# Patient Record
Sex: Female | Born: 1935 | Race: White | Hispanic: No | Smoking: Never smoker
Health system: Southern US, Community
[De-identification: ages and names within clinical notes are randomized; demographics above are authoritative.]

## PROBLEM LIST (undated history)

## (undated) DIAGNOSIS — Z9981 Dependence on supplemental oxygen: Secondary | ICD-10-CM

## (undated) DIAGNOSIS — IMO0002 Reserved for concepts with insufficient information to code with codable children: Secondary | ICD-10-CM

## (undated) DIAGNOSIS — I4891 Unspecified atrial fibrillation: Secondary | ICD-10-CM

## (undated) DIAGNOSIS — I8393 Asymptomatic varicose veins of bilateral lower extremities: Secondary | ICD-10-CM

## (undated) DIAGNOSIS — J40 Bronchitis, not specified as acute or chronic: Secondary | ICD-10-CM

## (undated) DIAGNOSIS — I5032 Chronic diastolic (congestive) heart failure: Secondary | ICD-10-CM

## (undated) DIAGNOSIS — E871 Hypo-osmolality and hyponatremia: Secondary | ICD-10-CM

## (undated) DIAGNOSIS — I4821 Permanent atrial fibrillation: Secondary | ICD-10-CM

## (undated) DIAGNOSIS — I509 Heart failure, unspecified: Secondary | ICD-10-CM

## (undated) DIAGNOSIS — B029 Zoster without complications: Secondary | ICD-10-CM

## (undated) DIAGNOSIS — E119 Type 2 diabetes mellitus without complications: Secondary | ICD-10-CM

## (undated) DIAGNOSIS — C50919 Malignant neoplasm of unspecified site of unspecified female breast: Secondary | ICD-10-CM

## (undated) DIAGNOSIS — I2510 Atherosclerotic heart disease of native coronary artery without angina pectoris: Secondary | ICD-10-CM

## (undated) DIAGNOSIS — E785 Hyperlipidemia, unspecified: Secondary | ICD-10-CM

## (undated) DIAGNOSIS — R609 Edema, unspecified: Secondary | ICD-10-CM

## (undated) DIAGNOSIS — F0390 Unspecified dementia without behavioral disturbance: Secondary | ICD-10-CM

## (undated) DIAGNOSIS — G629 Polyneuropathy, unspecified: Secondary | ICD-10-CM

## (undated) DIAGNOSIS — R600 Localized edema: Secondary | ICD-10-CM

## (undated) DIAGNOSIS — I341 Nonrheumatic mitral (valve) prolapse: Secondary | ICD-10-CM

## (undated) DIAGNOSIS — I679 Cerebrovascular disease, unspecified: Secondary | ICD-10-CM

## (undated) DIAGNOSIS — M1711 Unilateral primary osteoarthritis, right knee: Secondary | ICD-10-CM

## (undated) DIAGNOSIS — N182 Chronic kidney disease, stage 2 (mild): Secondary | ICD-10-CM

## (undated) DIAGNOSIS — R490 Dysphonia: Secondary | ICD-10-CM

## (undated) DIAGNOSIS — E039 Hypothyroidism, unspecified: Secondary | ICD-10-CM

## (undated) DIAGNOSIS — I739 Peripheral vascular disease, unspecified: Secondary | ICD-10-CM

## (undated) DIAGNOSIS — I10 Essential (primary) hypertension: Secondary | ICD-10-CM

## (undated) HISTORY — DX: Nonrheumatic mitral (valve) prolapse: I34.1

## (undated) HISTORY — DX: Unspecified dementia, unspecified severity, without behavioral disturbance, psychotic disturbance, mood disturbance, and anxiety: F03.90

## (undated) HISTORY — PX: COLONOSCOPY: SHX174

## (undated) HISTORY — DX: Zoster without complications: B02.9

## (undated) HISTORY — PX: ABDOMINAL HYSTERECTOMY: SHX81

## (undated) HISTORY — DX: Chronic kidney disease, stage 2 (mild): N18.2

## (undated) HISTORY — DX: Reserved for concepts with insufficient information to code with codable children: IMO0002

## (undated) HISTORY — PX: CATARACT EXTRACTION, BILATERAL: SHX1313

## (undated) HISTORY — PX: CORONARY ANGIOPLASTY WITH STENT PLACEMENT: SHX49

## (undated) HISTORY — DX: Heart failure, unspecified: I50.9

## (undated) HISTORY — PX: CARDIAC SURGERY: SHX584

## (undated) HISTORY — DX: Atherosclerotic heart disease of native coronary artery without angina pectoris: I25.10

## (undated) HISTORY — DX: Type 2 diabetes mellitus without complications: E11.9

## (undated) HISTORY — DX: Edema, unspecified: R60.9

## (undated) HISTORY — DX: Cerebrovascular disease, unspecified: I67.9

## (undated) HISTORY — DX: Unspecified atrial fibrillation: I48.91

## (undated) HISTORY — DX: Dysphonia: R49.0

## (undated) HISTORY — DX: Permanent atrial fibrillation: I48.21

## (undated) HISTORY — DX: Malignant neoplasm of unspecified site of unspecified female breast: C50.919

## (undated) HISTORY — DX: Hypothyroidism, unspecified: E03.9

## (undated) HISTORY — DX: Bronchitis, not specified as acute or chronic: J40

## (undated) HISTORY — DX: Peripheral vascular disease, unspecified: I73.9

---

## 1993-08-12 HISTORY — PX: OTHER SURGICAL HISTORY: SHX169

## 1999-10-03 ENCOUNTER — Encounter: Payer: Self-pay | Admitting: Cardiology

## 1999-10-03 ENCOUNTER — Inpatient Hospital Stay (HOSPITAL_COMMUNITY): Admission: AD | Admit: 1999-10-03 | Discharge: 1999-10-06 | Payer: Self-pay | Admitting: Cardiology

## 2001-01-26 ENCOUNTER — Other Ambulatory Visit: Admission: RE | Admit: 2001-01-26 | Discharge: 2001-01-26 | Payer: Self-pay | Admitting: Internal Medicine

## 2001-03-27 ENCOUNTER — Ambulatory Visit (HOSPITAL_COMMUNITY): Admission: RE | Admit: 2001-03-27 | Discharge: 2001-03-27 | Payer: Self-pay | Admitting: Oncology

## 2001-03-27 ENCOUNTER — Encounter (HOSPITAL_COMMUNITY): Payer: Self-pay | Admitting: Oncology

## 2001-04-03 ENCOUNTER — Encounter: Admission: RE | Admit: 2001-04-03 | Discharge: 2001-04-03 | Payer: Self-pay | Admitting: Oncology

## 2001-04-03 ENCOUNTER — Encounter (HOSPITAL_COMMUNITY): Admission: RE | Admit: 2001-04-03 | Discharge: 2001-05-03 | Payer: Self-pay | Admitting: Oncology

## 2001-08-05 ENCOUNTER — Emergency Department (HOSPITAL_COMMUNITY): Admission: EM | Admit: 2001-08-05 | Discharge: 2001-08-05 | Payer: Self-pay | Admitting: Emergency Medicine

## 2001-09-21 ENCOUNTER — Encounter: Admission: RE | Admit: 2001-09-21 | Discharge: 2001-09-21 | Payer: Self-pay | Admitting: Oncology

## 2001-09-21 ENCOUNTER — Encounter (HOSPITAL_COMMUNITY): Admission: RE | Admit: 2001-09-21 | Discharge: 2001-10-21 | Payer: Self-pay | Admitting: Oncology

## 2001-12-30 ENCOUNTER — Encounter (HOSPITAL_COMMUNITY): Payer: Self-pay | Admitting: Oncology

## 2001-12-30 ENCOUNTER — Encounter (HOSPITAL_COMMUNITY): Admission: RE | Admit: 2001-12-30 | Discharge: 2002-01-29 | Payer: Self-pay | Admitting: Oncology

## 2002-03-29 ENCOUNTER — Ambulatory Visit (HOSPITAL_COMMUNITY): Admission: RE | Admit: 2002-03-29 | Discharge: 2002-03-29 | Payer: Self-pay | Admitting: Oncology

## 2002-03-29 ENCOUNTER — Encounter (HOSPITAL_COMMUNITY): Payer: Self-pay | Admitting: Oncology

## 2002-06-22 ENCOUNTER — Encounter: Payer: Self-pay | Admitting: Internal Medicine

## 2002-06-22 ENCOUNTER — Ambulatory Visit (HOSPITAL_COMMUNITY): Admission: RE | Admit: 2002-06-22 | Discharge: 2002-06-22 | Payer: Self-pay | Admitting: Internal Medicine

## 2002-08-11 ENCOUNTER — Encounter (HOSPITAL_COMMUNITY): Admission: RE | Admit: 2002-08-11 | Discharge: 2002-09-10 | Payer: Self-pay | Admitting: Oncology

## 2002-08-11 ENCOUNTER — Encounter: Admission: RE | Admit: 2002-08-11 | Discharge: 2002-08-11 | Payer: Self-pay | Admitting: Oncology

## 2002-11-21 ENCOUNTER — Emergency Department (HOSPITAL_COMMUNITY): Admission: EM | Admit: 2002-11-21 | Discharge: 2002-11-21 | Payer: Self-pay | Admitting: *Deleted

## 2002-11-21 ENCOUNTER — Encounter: Payer: Self-pay | Admitting: *Deleted

## 2002-12-01 ENCOUNTER — Ambulatory Visit (HOSPITAL_COMMUNITY): Admission: RE | Admit: 2002-12-01 | Discharge: 2002-12-01 | Payer: Self-pay | Admitting: Internal Medicine

## 2002-12-01 ENCOUNTER — Encounter: Payer: Self-pay | Admitting: Internal Medicine

## 2002-12-17 ENCOUNTER — Encounter (HOSPITAL_COMMUNITY): Admission: RE | Admit: 2002-12-17 | Discharge: 2003-01-16 | Payer: Self-pay | Admitting: *Deleted

## 2002-12-24 ENCOUNTER — Encounter: Payer: Self-pay | Admitting: *Deleted

## 2002-12-24 ENCOUNTER — Emergency Department (HOSPITAL_COMMUNITY): Admission: EM | Admit: 2002-12-24 | Discharge: 2002-12-24 | Payer: Self-pay | Admitting: *Deleted

## 2002-12-24 ENCOUNTER — Inpatient Hospital Stay (HOSPITAL_COMMUNITY): Admission: EM | Admit: 2002-12-24 | Discharge: 2002-12-28 | Payer: Self-pay | Admitting: Internal Medicine

## 2003-01-08 ENCOUNTER — Encounter: Payer: Self-pay | Admitting: Cardiology

## 2003-01-08 ENCOUNTER — Encounter: Payer: Self-pay | Admitting: Emergency Medicine

## 2003-01-08 ENCOUNTER — Emergency Department (HOSPITAL_COMMUNITY): Admission: EM | Admit: 2003-01-08 | Discharge: 2003-01-08 | Payer: Self-pay | Admitting: Emergency Medicine

## 2003-01-08 ENCOUNTER — Inpatient Hospital Stay (HOSPITAL_COMMUNITY): Admission: EM | Admit: 2003-01-08 | Discharge: 2003-01-10 | Payer: Self-pay | Admitting: Cardiology

## 2003-01-10 ENCOUNTER — Encounter: Payer: Self-pay | Admitting: Cardiology

## 2003-02-09 ENCOUNTER — Encounter: Admission: RE | Admit: 2003-02-09 | Discharge: 2003-02-09 | Payer: Self-pay | Admitting: Oncology

## 2003-02-09 ENCOUNTER — Encounter (HOSPITAL_COMMUNITY): Admission: RE | Admit: 2003-02-09 | Discharge: 2003-03-11 | Payer: Self-pay | Admitting: Oncology

## 2003-04-01 ENCOUNTER — Ambulatory Visit (HOSPITAL_COMMUNITY): Admission: RE | Admit: 2003-04-01 | Discharge: 2003-04-01 | Payer: Self-pay | Admitting: Oncology

## 2003-04-01 ENCOUNTER — Encounter (HOSPITAL_COMMUNITY): Payer: Self-pay | Admitting: Oncology

## 2003-08-19 ENCOUNTER — Encounter (HOSPITAL_COMMUNITY): Admission: RE | Admit: 2003-08-19 | Discharge: 2003-09-18 | Payer: Self-pay | Admitting: Oncology

## 2003-08-19 ENCOUNTER — Encounter: Admission: RE | Admit: 2003-08-19 | Discharge: 2003-08-19 | Payer: Self-pay | Admitting: Oncology

## 2003-09-19 ENCOUNTER — Ambulatory Visit (HOSPITAL_COMMUNITY): Admission: RE | Admit: 2003-09-19 | Discharge: 2003-09-19 | Payer: Self-pay | Admitting: Cardiology

## 2004-03-13 ENCOUNTER — Encounter (HOSPITAL_COMMUNITY): Admission: RE | Admit: 2004-03-13 | Discharge: 2004-04-12 | Payer: Self-pay | Admitting: Oncology

## 2004-03-13 ENCOUNTER — Encounter: Admission: RE | Admit: 2004-03-13 | Discharge: 2004-03-13 | Payer: Self-pay | Admitting: Oncology

## 2004-04-17 ENCOUNTER — Encounter: Admission: RE | Admit: 2004-04-17 | Discharge: 2004-05-11 | Payer: Self-pay | Admitting: Oncology

## 2004-04-17 ENCOUNTER — Encounter (HOSPITAL_COMMUNITY): Admission: RE | Admit: 2004-04-17 | Discharge: 2004-05-11 | Payer: Self-pay | Admitting: Oncology

## 2004-06-26 ENCOUNTER — Ambulatory Visit (HOSPITAL_COMMUNITY): Admission: RE | Admit: 2004-06-26 | Discharge: 2004-06-26 | Payer: Self-pay | Admitting: Internal Medicine

## 2004-07-06 ENCOUNTER — Ambulatory Visit: Payer: Self-pay | Admitting: Cardiology

## 2004-07-20 ENCOUNTER — Ambulatory Visit: Payer: Self-pay | Admitting: *Deleted

## 2004-08-03 ENCOUNTER — Ambulatory Visit: Payer: Self-pay | Admitting: Cardiology

## 2004-08-12 HISTORY — PX: CHOLECYSTECTOMY: SHX55

## 2004-12-14 ENCOUNTER — Ambulatory Visit: Payer: Self-pay | Admitting: Cardiology

## 2004-12-31 ENCOUNTER — Ambulatory Visit: Payer: Self-pay | Admitting: *Deleted

## 2004-12-31 ENCOUNTER — Ambulatory Visit (HOSPITAL_COMMUNITY): Admission: RE | Admit: 2004-12-31 | Discharge: 2004-12-31 | Payer: Self-pay | Admitting: Cardiology

## 2005-02-13 ENCOUNTER — Ambulatory Visit: Payer: Self-pay | Admitting: Cardiology

## 2005-02-27 ENCOUNTER — Ambulatory Visit: Payer: Self-pay | Admitting: Cardiology

## 2005-03-13 ENCOUNTER — Encounter: Admission: RE | Admit: 2005-03-13 | Discharge: 2005-03-13 | Payer: Self-pay | Admitting: Oncology

## 2005-03-13 ENCOUNTER — Ambulatory Visit (HOSPITAL_COMMUNITY): Payer: Self-pay | Admitting: Oncology

## 2005-03-13 ENCOUNTER — Encounter (HOSPITAL_COMMUNITY): Admission: RE | Admit: 2005-03-13 | Discharge: 2005-04-12 | Payer: Self-pay | Admitting: Oncology

## 2005-03-13 ENCOUNTER — Ambulatory Visit: Payer: Self-pay | Admitting: *Deleted

## 2005-03-29 ENCOUNTER — Ambulatory Visit: Payer: Self-pay | Admitting: Cardiology

## 2005-04-04 ENCOUNTER — Ambulatory Visit: Payer: Self-pay | Admitting: *Deleted

## 2005-04-04 ENCOUNTER — Inpatient Hospital Stay (HOSPITAL_COMMUNITY): Admission: EM | Admit: 2005-04-04 | Discharge: 2005-04-16 | Payer: Self-pay | Admitting: Emergency Medicine

## 2005-04-05 ENCOUNTER — Encounter (INDEPENDENT_AMBULATORY_CARE_PROVIDER_SITE_OTHER): Payer: Self-pay | Admitting: General Surgery

## 2005-04-23 ENCOUNTER — Encounter: Admission: RE | Admit: 2005-04-23 | Discharge: 2005-04-23 | Payer: Self-pay | Admitting: Oncology

## 2005-04-25 ENCOUNTER — Ambulatory Visit: Payer: Self-pay | Admitting: Cardiology

## 2005-05-03 ENCOUNTER — Ambulatory Visit: Payer: Self-pay | Admitting: Cardiology

## 2005-05-13 ENCOUNTER — Ambulatory Visit: Payer: Self-pay | Admitting: Cardiology

## 2005-05-16 ENCOUNTER — Ambulatory Visit (HOSPITAL_COMMUNITY): Admission: RE | Admit: 2005-05-16 | Discharge: 2005-05-16 | Payer: Self-pay | Admitting: Cardiology

## 2005-05-29 ENCOUNTER — Ambulatory Visit: Payer: Self-pay | Admitting: *Deleted

## 2005-06-20 ENCOUNTER — Ambulatory Visit: Payer: Self-pay | Admitting: Cardiology

## 2005-06-20 ENCOUNTER — Ambulatory Visit (HOSPITAL_COMMUNITY): Admission: RE | Admit: 2005-06-20 | Discharge: 2005-06-20 | Payer: Self-pay | Admitting: Cardiology

## 2005-07-01 ENCOUNTER — Ambulatory Visit: Payer: Self-pay | Admitting: Cardiology

## 2005-07-23 ENCOUNTER — Ambulatory Visit: Payer: Self-pay | Admitting: Cardiology

## 2005-08-15 ENCOUNTER — Ambulatory Visit: Payer: Self-pay | Admitting: Cardiology

## 2005-08-21 ENCOUNTER — Ambulatory Visit (HOSPITAL_COMMUNITY): Admission: RE | Admit: 2005-08-21 | Discharge: 2005-08-21 | Payer: Self-pay | Admitting: Cardiology

## 2005-09-18 ENCOUNTER — Ambulatory Visit: Payer: Self-pay | Admitting: *Deleted

## 2005-10-23 ENCOUNTER — Ambulatory Visit: Payer: Self-pay | Admitting: Cardiology

## 2005-10-24 ENCOUNTER — Ambulatory Visit (HOSPITAL_COMMUNITY): Admission: RE | Admit: 2005-10-24 | Discharge: 2005-10-24 | Payer: Self-pay | Admitting: Cardiology

## 2005-11-28 ENCOUNTER — Ambulatory Visit: Payer: Self-pay | Admitting: *Deleted

## 2005-12-30 ENCOUNTER — Ambulatory Visit: Payer: Self-pay | Admitting: *Deleted

## 2006-01-29 ENCOUNTER — Ambulatory Visit: Payer: Self-pay | Admitting: *Deleted

## 2006-02-21 ENCOUNTER — Ambulatory Visit: Payer: Self-pay | Admitting: Cardiology

## 2006-03-12 ENCOUNTER — Ambulatory Visit (HOSPITAL_COMMUNITY): Payer: Self-pay | Admitting: Oncology

## 2006-03-12 ENCOUNTER — Encounter: Admission: RE | Admit: 2006-03-12 | Discharge: 2006-03-12 | Payer: Self-pay | Admitting: Oncology

## 2006-03-25 ENCOUNTER — Ambulatory Visit: Payer: Self-pay | Admitting: *Deleted

## 2006-04-03 ENCOUNTER — Ambulatory Visit: Payer: Self-pay | Admitting: Cardiology

## 2006-04-15 ENCOUNTER — Ambulatory Visit: Payer: Self-pay | Admitting: Cardiology

## 2006-05-13 ENCOUNTER — Ambulatory Visit: Payer: Self-pay | Admitting: Cardiology

## 2006-05-15 ENCOUNTER — Encounter (HOSPITAL_COMMUNITY): Admission: RE | Admit: 2006-05-15 | Discharge: 2006-06-14 | Payer: Self-pay | Admitting: Oncology

## 2006-05-15 ENCOUNTER — Encounter: Admission: RE | Admit: 2006-05-15 | Discharge: 2006-05-15 | Payer: Self-pay | Admitting: Oncology

## 2006-05-20 ENCOUNTER — Inpatient Hospital Stay (HOSPITAL_COMMUNITY): Admission: EM | Admit: 2006-05-20 | Discharge: 2006-05-28 | Payer: Self-pay | Admitting: Emergency Medicine

## 2006-05-20 ENCOUNTER — Ambulatory Visit: Payer: Self-pay | Admitting: Cardiology

## 2006-05-20 ENCOUNTER — Ambulatory Visit: Payer: Self-pay | Admitting: Orthopedic Surgery

## 2006-05-22 ENCOUNTER — Encounter: Payer: Self-pay | Admitting: Orthopedic Surgery

## 2006-05-22 HISTORY — PX: OTHER SURGICAL HISTORY: SHX169

## 2006-05-28 ENCOUNTER — Emergency Department (HOSPITAL_COMMUNITY): Admission: EM | Admit: 2006-05-28 | Discharge: 2006-05-28 | Payer: Self-pay | Admitting: Emergency Medicine

## 2006-06-26 ENCOUNTER — Ambulatory Visit: Payer: Self-pay | Admitting: Orthopedic Surgery

## 2006-07-04 ENCOUNTER — Ambulatory Visit: Payer: Self-pay | Admitting: Cardiology

## 2006-07-22 ENCOUNTER — Ambulatory Visit (HOSPITAL_COMMUNITY): Admission: RE | Admit: 2006-07-22 | Discharge: 2006-07-22 | Payer: Self-pay | Admitting: Ophthalmology

## 2006-07-30 ENCOUNTER — Ambulatory Visit: Payer: Self-pay | Admitting: Cardiovascular Disease

## 2006-08-15 ENCOUNTER — Ambulatory Visit: Payer: Self-pay | Admitting: Cardiology

## 2006-08-19 ENCOUNTER — Ambulatory Visit (HOSPITAL_COMMUNITY): Admission: RE | Admit: 2006-08-19 | Discharge: 2006-08-19 | Payer: Self-pay | Admitting: Ophthalmology

## 2006-08-25 ENCOUNTER — Ambulatory Visit: Payer: Self-pay | Admitting: Orthopedic Surgery

## 2006-09-01 ENCOUNTER — Ambulatory Visit (HOSPITAL_COMMUNITY): Payer: Self-pay | Admitting: Oncology

## 2006-09-18 ENCOUNTER — Ambulatory Visit: Payer: Self-pay | Admitting: Internal Medicine

## 2006-10-24 ENCOUNTER — Ambulatory Visit: Payer: Self-pay | Admitting: Internal Medicine

## 2006-10-29 ENCOUNTER — Ambulatory Visit (HOSPITAL_COMMUNITY): Admission: RE | Admit: 2006-10-29 | Discharge: 2006-10-29 | Payer: Self-pay | Admitting: Internal Medicine

## 2006-11-19 ENCOUNTER — Ambulatory Visit: Payer: Self-pay | Admitting: Cardiology

## 2006-11-20 ENCOUNTER — Inpatient Hospital Stay (HOSPITAL_COMMUNITY): Admission: RE | Admit: 2006-11-20 | Discharge: 2006-11-24 | Payer: Self-pay | Admitting: Internal Medicine

## 2006-11-20 ENCOUNTER — Emergency Department (HOSPITAL_COMMUNITY): Admission: EM | Admit: 2006-11-20 | Discharge: 2006-11-20 | Payer: Self-pay | Admitting: Emergency Medicine

## 2006-11-20 ENCOUNTER — Ambulatory Visit: Payer: Self-pay | Admitting: Cardiology

## 2006-12-16 ENCOUNTER — Ambulatory Visit (HOSPITAL_COMMUNITY): Admission: RE | Admit: 2006-12-16 | Discharge: 2006-12-16 | Payer: Self-pay | Admitting: Cardiology

## 2006-12-16 ENCOUNTER — Ambulatory Visit: Payer: Self-pay | Admitting: Cardiology

## 2006-12-23 ENCOUNTER — Ambulatory Visit: Payer: Self-pay | Admitting: Cardiovascular Disease

## 2006-12-29 ENCOUNTER — Ambulatory Visit (HOSPITAL_COMMUNITY): Payer: Self-pay | Admitting: Oncology

## 2007-01-20 ENCOUNTER — Ambulatory Visit: Payer: Self-pay | Admitting: Orthopedic Surgery

## 2007-01-27 ENCOUNTER — Ambulatory Visit: Payer: Self-pay | Admitting: Cardiology

## 2007-02-24 ENCOUNTER — Ambulatory Visit: Payer: Self-pay | Admitting: Cardiology

## 2007-03-11 ENCOUNTER — Ambulatory Visit (HOSPITAL_COMMUNITY): Payer: Self-pay | Admitting: Oncology

## 2007-03-11 ENCOUNTER — Ambulatory Visit: Payer: Self-pay | Admitting: Cardiology

## 2007-03-30 ENCOUNTER — Ambulatory Visit: Payer: Self-pay | Admitting: Cardiology

## 2007-04-02 ENCOUNTER — Ambulatory Visit: Payer: Self-pay | Admitting: Cardiology

## 2007-04-08 ENCOUNTER — Ambulatory Visit: Payer: Self-pay | Admitting: Cardiovascular Disease

## 2007-05-01 ENCOUNTER — Encounter (HOSPITAL_COMMUNITY): Admission: RE | Admit: 2007-05-01 | Discharge: 2007-05-12 | Payer: Self-pay | Admitting: Oncology

## 2007-05-01 ENCOUNTER — Ambulatory Visit (HOSPITAL_COMMUNITY): Payer: Self-pay | Admitting: Oncology

## 2007-05-06 ENCOUNTER — Ambulatory Visit: Payer: Self-pay | Admitting: Cardiology

## 2007-05-18 ENCOUNTER — Encounter (HOSPITAL_COMMUNITY): Admission: RE | Admit: 2007-05-18 | Discharge: 2007-06-17 | Payer: Self-pay | Admitting: Oncology

## 2007-06-03 ENCOUNTER — Ambulatory Visit: Payer: Self-pay | Admitting: Cardiology

## 2007-06-11 ENCOUNTER — Ambulatory Visit (HOSPITAL_COMMUNITY): Admission: RE | Admit: 2007-06-11 | Discharge: 2007-06-11 | Payer: Self-pay | Admitting: Internal Medicine

## 2007-06-17 ENCOUNTER — Ambulatory Visit: Payer: Self-pay | Admitting: Cardiology

## 2007-06-22 ENCOUNTER — Ambulatory Visit: Payer: Self-pay | Admitting: Cardiology

## 2007-06-30 ENCOUNTER — Ambulatory Visit (HOSPITAL_COMMUNITY): Admission: RE | Admit: 2007-06-30 | Discharge: 2007-06-30 | Payer: Self-pay | Admitting: Cardiology

## 2007-07-06 ENCOUNTER — Ambulatory Visit: Payer: Self-pay | Admitting: Cardiology

## 2007-07-29 ENCOUNTER — Ambulatory Visit: Payer: Self-pay | Admitting: Cardiology

## 2007-08-04 ENCOUNTER — Ambulatory Visit: Payer: Self-pay | Admitting: Cardiology

## 2007-08-26 ENCOUNTER — Ambulatory Visit: Payer: Self-pay | Admitting: Cardiology

## 2007-08-28 ENCOUNTER — Encounter (HOSPITAL_COMMUNITY): Admission: RE | Admit: 2007-08-28 | Discharge: 2007-09-27 | Payer: Self-pay | Admitting: Oncology

## 2007-08-28 ENCOUNTER — Ambulatory Visit (HOSPITAL_COMMUNITY): Payer: Self-pay | Admitting: Oncology

## 2007-09-23 ENCOUNTER — Ambulatory Visit: Payer: Self-pay | Admitting: Cardiology

## 2007-09-28 ENCOUNTER — Ambulatory Visit: Payer: Self-pay | Admitting: Cardiology

## 2007-09-28 ENCOUNTER — Ambulatory Visit (HOSPITAL_COMMUNITY): Admission: RE | Admit: 2007-09-28 | Discharge: 2007-09-28 | Payer: Self-pay | Admitting: Cardiology

## 2007-10-07 ENCOUNTER — Ambulatory Visit: Payer: Self-pay | Admitting: Cardiology

## 2007-10-21 ENCOUNTER — Ambulatory Visit: Payer: Self-pay | Admitting: Cardiology

## 2007-11-19 ENCOUNTER — Ambulatory Visit: Payer: Self-pay | Admitting: Cardiology

## 2007-12-03 ENCOUNTER — Ambulatory Visit: Payer: Self-pay | Admitting: Cardiovascular Disease

## 2007-12-24 ENCOUNTER — Encounter (HOSPITAL_COMMUNITY): Admission: RE | Admit: 2007-12-24 | Discharge: 2008-01-23 | Payer: Self-pay | Admitting: Oncology

## 2007-12-24 ENCOUNTER — Ambulatory Visit (HOSPITAL_COMMUNITY): Payer: Self-pay | Admitting: Oncology

## 2007-12-31 ENCOUNTER — Ambulatory Visit: Payer: Self-pay | Admitting: Cardiovascular Disease

## 2008-01-28 ENCOUNTER — Ambulatory Visit: Payer: Self-pay | Admitting: Cardiology

## 2008-03-01 ENCOUNTER — Ambulatory Visit: Payer: Self-pay | Admitting: Cardiovascular Disease

## 2008-03-29 ENCOUNTER — Ambulatory Visit: Payer: Self-pay | Admitting: Cardiology

## 2008-04-25 ENCOUNTER — Ambulatory Visit: Payer: Self-pay | Admitting: Cardiology

## 2008-04-26 ENCOUNTER — Ambulatory Visit (HOSPITAL_COMMUNITY): Payer: Self-pay | Admitting: Oncology

## 2008-04-26 ENCOUNTER — Encounter (HOSPITAL_COMMUNITY): Admission: RE | Admit: 2008-04-26 | Discharge: 2008-05-09 | Payer: Self-pay | Admitting: Oncology

## 2008-05-16 ENCOUNTER — Ambulatory Visit: Payer: Self-pay | Admitting: Cardiology

## 2008-05-19 ENCOUNTER — Encounter (HOSPITAL_COMMUNITY): Admission: RE | Admit: 2008-05-19 | Discharge: 2008-06-18 | Payer: Self-pay | Admitting: Oncology

## 2008-05-30 ENCOUNTER — Ambulatory Visit: Payer: Self-pay | Admitting: Cardiology

## 2008-06-13 ENCOUNTER — Ambulatory Visit: Payer: Self-pay | Admitting: Cardiology

## 2008-06-16 ENCOUNTER — Ambulatory Visit (HOSPITAL_COMMUNITY): Admission: RE | Admit: 2008-06-16 | Discharge: 2008-06-16 | Payer: Self-pay | Admitting: Internal Medicine

## 2008-07-01 ENCOUNTER — Ambulatory Visit: Payer: Self-pay | Admitting: Cardiology

## 2008-07-14 ENCOUNTER — Ambulatory Visit: Payer: Self-pay | Admitting: Cardiology

## 2008-07-20 ENCOUNTER — Ambulatory Visit: Payer: Self-pay | Admitting: Cardiovascular Disease

## 2008-08-15 ENCOUNTER — Ambulatory Visit: Payer: Self-pay | Admitting: Cardiology

## 2008-08-24 ENCOUNTER — Ambulatory Visit: Payer: Self-pay | Admitting: Orthopedic Surgery

## 2008-08-24 DIAGNOSIS — M25559 Pain in unspecified hip: Secondary | ICD-10-CM | POA: Insufficient documentation

## 2008-08-24 DIAGNOSIS — S72143A Displaced intertrochanteric fracture of unspecified femur, initial encounter for closed fracture: Secondary | ICD-10-CM | POA: Insufficient documentation

## 2008-08-26 ENCOUNTER — Telehealth: Payer: Self-pay | Admitting: Orthopedic Surgery

## 2008-08-29 ENCOUNTER — Encounter (HOSPITAL_COMMUNITY): Admission: RE | Admit: 2008-08-29 | Discharge: 2008-09-28 | Payer: Self-pay | Admitting: Oncology

## 2008-08-29 ENCOUNTER — Ambulatory Visit (HOSPITAL_COMMUNITY): Payer: Self-pay | Admitting: Oncology

## 2008-08-29 ENCOUNTER — Ambulatory Visit (HOSPITAL_COMMUNITY): Admission: RE | Admit: 2008-08-29 | Discharge: 2008-08-29 | Payer: Self-pay | Admitting: Orthopedic Surgery

## 2008-09-05 ENCOUNTER — Ambulatory Visit: Payer: Self-pay | Admitting: Orthopedic Surgery

## 2008-09-05 DIAGNOSIS — M48061 Spinal stenosis, lumbar region without neurogenic claudication: Secondary | ICD-10-CM | POA: Insufficient documentation

## 2008-09-15 ENCOUNTER — Ambulatory Visit: Payer: Self-pay | Admitting: Cardiology

## 2008-09-26 ENCOUNTER — Ambulatory Visit: Payer: Self-pay | Admitting: Cardiology

## 2008-10-13 ENCOUNTER — Ambulatory Visit: Payer: Self-pay | Admitting: Cardiology

## 2008-10-27 ENCOUNTER — Ambulatory Visit: Payer: Self-pay | Admitting: Cardiology

## 2008-11-24 ENCOUNTER — Ambulatory Visit: Payer: Self-pay | Admitting: Cardiology

## 2008-12-22 ENCOUNTER — Ambulatory Visit: Payer: Self-pay | Admitting: Cardiology

## 2009-01-12 ENCOUNTER — Ambulatory Visit: Payer: Self-pay | Admitting: Cardiology

## 2009-02-09 ENCOUNTER — Ambulatory Visit: Payer: Self-pay | Admitting: Cardiology

## 2009-03-09 ENCOUNTER — Ambulatory Visit: Payer: Self-pay | Admitting: Cardiology

## 2009-03-27 ENCOUNTER — Encounter: Payer: Self-pay | Admitting: *Deleted

## 2009-04-06 ENCOUNTER — Ambulatory Visit: Payer: Self-pay | Admitting: Cardiology

## 2009-04-06 LAB — CONVERTED CEMR LAB: POC INR: 1.8

## 2009-04-21 ENCOUNTER — Encounter (INDEPENDENT_AMBULATORY_CARE_PROVIDER_SITE_OTHER): Payer: Self-pay | Admitting: *Deleted

## 2009-04-21 LAB — CONVERTED CEMR LAB
Albumin: 4 g/dL
CO2: 20 meq/L
Chloride: 104 meq/L
Creatinine, Ser: 0.97 mg/dL
Free T4: 0.98 ng/dL
Hgb A1c MFr Bld: 6.3 %
Platelets: 287 10*3/uL
Total Protein: 7.2 g/dL
WBC: 7.8 10*3/uL

## 2009-05-04 ENCOUNTER — Ambulatory Visit: Payer: Self-pay | Admitting: Cardiology

## 2009-05-22 ENCOUNTER — Ambulatory Visit (HOSPITAL_COMMUNITY): Admission: RE | Admit: 2009-05-22 | Discharge: 2009-05-22 | Payer: Self-pay | Admitting: Oncology

## 2009-05-30 ENCOUNTER — Ambulatory Visit (HOSPITAL_COMMUNITY): Payer: Self-pay | Admitting: Oncology

## 2009-05-30 ENCOUNTER — Encounter: Payer: Self-pay | Admitting: Cardiology

## 2009-06-01 ENCOUNTER — Ambulatory Visit: Payer: Self-pay | Admitting: Cardiology

## 2009-06-26 ENCOUNTER — Ambulatory Visit: Payer: Self-pay | Admitting: Cardiology

## 2009-07-24 ENCOUNTER — Ambulatory Visit: Payer: Self-pay | Admitting: Cardiology

## 2009-07-24 LAB — CONVERTED CEMR LAB: POC INR: 1.9

## 2009-08-28 ENCOUNTER — Ambulatory Visit: Payer: Self-pay | Admitting: Cardiology

## 2009-08-29 ENCOUNTER — Encounter (HOSPITAL_COMMUNITY): Admission: RE | Admit: 2009-08-29 | Discharge: 2009-09-28 | Payer: Self-pay | Admitting: Oncology

## 2009-08-29 ENCOUNTER — Ambulatory Visit (HOSPITAL_COMMUNITY): Payer: Self-pay | Admitting: Oncology

## 2009-09-25 ENCOUNTER — Ambulatory Visit: Payer: Self-pay | Admitting: Cardiology

## 2009-10-23 ENCOUNTER — Ambulatory Visit: Payer: Self-pay | Admitting: Cardiology

## 2009-10-23 LAB — CONVERTED CEMR LAB: POC INR: 2.7

## 2009-11-15 ENCOUNTER — Encounter: Payer: Self-pay | Admitting: *Deleted

## 2009-11-21 ENCOUNTER — Encounter (INDEPENDENT_AMBULATORY_CARE_PROVIDER_SITE_OTHER): Payer: Self-pay | Admitting: *Deleted

## 2009-11-21 DIAGNOSIS — E785 Hyperlipidemia, unspecified: Secondary | ICD-10-CM

## 2009-11-21 DIAGNOSIS — E119 Type 2 diabetes mellitus without complications: Secondary | ICD-10-CM | POA: Insufficient documentation

## 2009-11-21 DIAGNOSIS — I5032 Chronic diastolic (congestive) heart failure: Secondary | ICD-10-CM

## 2009-11-21 DIAGNOSIS — B029 Zoster without complications: Secondary | ICD-10-CM | POA: Insufficient documentation

## 2009-11-21 DIAGNOSIS — I5033 Acute on chronic diastolic (congestive) heart failure: Secondary | ICD-10-CM | POA: Insufficient documentation

## 2009-11-21 DIAGNOSIS — I10 Essential (primary) hypertension: Secondary | ICD-10-CM | POA: Insufficient documentation

## 2009-11-21 HISTORY — DX: Chronic diastolic (congestive) heart failure: I50.32

## 2009-11-22 ENCOUNTER — Encounter (INDEPENDENT_AMBULATORY_CARE_PROVIDER_SITE_OTHER): Payer: Self-pay | Admitting: *Deleted

## 2009-11-22 ENCOUNTER — Ambulatory Visit: Payer: Self-pay | Admitting: Cardiology

## 2009-11-22 LAB — CONVERTED CEMR LAB: POC INR: 2.4

## 2009-11-27 ENCOUNTER — Encounter: Payer: Self-pay | Admitting: Cardiology

## 2009-11-27 ENCOUNTER — Encounter (INDEPENDENT_AMBULATORY_CARE_PROVIDER_SITE_OTHER): Payer: Self-pay | Admitting: *Deleted

## 2009-11-27 LAB — CONVERTED CEMR LAB
ALT: 10 units/L (ref 0–35)
AST: 14 units/L (ref 0–37)
Albumin: 4 g/dL (ref 3.5–5.2)
CO2: 25 meq/L
Calcium: 9.1 mg/dL
Calcium: 9.1 mg/dL (ref 8.4–10.5)
Chloride: 102 meq/L
Chloride: 102 meq/L (ref 96–112)
Creatinine, Ser: 0.99 mg/dL
HCT: 37.2 % (ref 36.0–46.0)
Hemoglobin: 11.3 g/dL
LDL Cholesterol: 58 mg/dL
Lymphocytes Relative: 24 % (ref 12–46)
Lymphs Abs: 1.9 10*3/uL (ref 0.7–4.0)
MCV: 79.5 fL
Neutrophils Relative %: 64 % (ref 43–77)
Platelets: 271 10*3/uL
Platelets: 271 10*3/uL (ref 150–400)
Potassium: 4.7 meq/L (ref 3.5–5.3)
Sodium: 137 meq/L (ref 135–145)
Total CHOL/HDL Ratio: 3
Total Protein: 6.6 g/dL
WBC: 7.9 10*3/uL
WBC: 7.9 10*3/uL (ref 4.0–10.5)

## 2009-11-30 ENCOUNTER — Encounter (INDEPENDENT_AMBULATORY_CARE_PROVIDER_SITE_OTHER): Payer: Self-pay | Admitting: *Deleted

## 2009-11-30 ENCOUNTER — Telehealth: Payer: Self-pay | Admitting: Cardiology

## 2009-12-20 ENCOUNTER — Ambulatory Visit: Payer: Self-pay | Admitting: Cardiology

## 2009-12-20 LAB — CONVERTED CEMR LAB: POC INR: 1.9

## 2009-12-21 ENCOUNTER — Encounter (INDEPENDENT_AMBULATORY_CARE_PROVIDER_SITE_OTHER): Payer: Self-pay | Admitting: *Deleted

## 2010-01-17 ENCOUNTER — Ambulatory Visit: Payer: Self-pay | Admitting: Cardiology

## 2010-01-17 LAB — CONVERTED CEMR LAB: POC INR: 2

## 2010-02-14 ENCOUNTER — Ambulatory Visit: Payer: Self-pay | Admitting: Cardiovascular Disease

## 2010-03-12 ENCOUNTER — Ambulatory Visit: Payer: Self-pay | Admitting: Cardiology

## 2010-04-09 ENCOUNTER — Ambulatory Visit: Payer: Self-pay | Admitting: Cardiology

## 2010-05-07 ENCOUNTER — Ambulatory Visit: Payer: Self-pay | Admitting: Cardiology

## 2010-05-07 LAB — CONVERTED CEMR LAB: POC INR: 2.8

## 2010-05-24 ENCOUNTER — Ambulatory Visit (HOSPITAL_COMMUNITY): Admission: RE | Admit: 2010-05-24 | Discharge: 2010-05-24 | Payer: Self-pay | Admitting: Oncology

## 2010-06-04 ENCOUNTER — Ambulatory Visit: Payer: Self-pay | Admitting: Cardiology

## 2010-06-04 LAB — CONVERTED CEMR LAB: POC INR: 3.5

## 2010-06-06 ENCOUNTER — Ambulatory Visit (HOSPITAL_COMMUNITY)
Admission: RE | Admit: 2010-06-06 | Discharge: 2010-06-06 | Payer: Self-pay | Source: Home / Self Care | Admitting: Oncology

## 2010-06-13 ENCOUNTER — Ambulatory Visit (HOSPITAL_COMMUNITY): Payer: Self-pay | Admitting: Oncology

## 2010-06-13 ENCOUNTER — Encounter (HOSPITAL_COMMUNITY)
Admission: RE | Admit: 2010-06-13 | Discharge: 2010-07-13 | Payer: Self-pay | Source: Home / Self Care | Admitting: Oncology

## 2010-06-19 ENCOUNTER — Ambulatory Visit (HOSPITAL_COMMUNITY): Admission: RE | Admit: 2010-06-19 | Discharge: 2010-06-19 | Payer: Self-pay | Admitting: Oncology

## 2010-06-26 ENCOUNTER — Encounter: Payer: Self-pay | Admitting: Cardiology

## 2010-07-02 ENCOUNTER — Ambulatory Visit: Payer: Self-pay | Admitting: Cardiology

## 2010-07-02 LAB — CONVERTED CEMR LAB: POC INR: 3.4

## 2010-07-30 ENCOUNTER — Ambulatory Visit: Payer: Self-pay | Admitting: Cardiology

## 2010-07-31 ENCOUNTER — Encounter: Payer: Self-pay | Admitting: Adult Health

## 2010-08-08 ENCOUNTER — Encounter (INDEPENDENT_AMBULATORY_CARE_PROVIDER_SITE_OTHER): Payer: Self-pay | Admitting: *Deleted

## 2010-08-08 LAB — CONVERTED CEMR LAB
ALT: 8 units/L (ref 0–35)
AST: 14 units/L (ref 0–37)
Albumin: 3.8 g/dL (ref 3.5–5.2)
Alkaline Phosphatase: 58 units/L (ref 39–117)
Basophils Absolute: 0 10*3/uL (ref 0.0–0.1)
Basophils Relative: 1 % (ref 0–1)
Eosinophils Absolute: 0.2 10*3/uL (ref 0.0–0.7)
HDL: 42 mg/dL (ref >39)
Hgb A1c MFr Bld: 6.3 % — ABNORMAL HIGH (ref <5.7)
LDL Cholesterol: 54 mg/dL (ref 0–99)
MCHC: 31.1 g/dL (ref 30.0–36.0)
MCV: 81.4 fL (ref 78.0–100.0)
Neutro Abs: 4.7 10*3/uL (ref 1.7–7.7)
Neutrophils Relative %: 63 % (ref 43–77)
OCCULT 1: NEGATIVE
OCCULT 3: NEGATIVE
Platelets: 337 10*3/uL (ref 150–400)
Potassium: 4.4 meq/L (ref 3.5–5.3)
Sodium: 140 meq/L (ref 135–145)
Total Protein: 6.8 g/dL (ref 6.0–8.3)

## 2010-08-14 ENCOUNTER — Encounter (INDEPENDENT_AMBULATORY_CARE_PROVIDER_SITE_OTHER): Payer: Self-pay | Admitting: *Deleted

## 2010-08-15 ENCOUNTER — Encounter (INDEPENDENT_AMBULATORY_CARE_PROVIDER_SITE_OTHER): Payer: Self-pay | Admitting: *Deleted

## 2010-08-27 ENCOUNTER — Ambulatory Visit: Admission: RE | Admit: 2010-08-27 | Discharge: 2010-08-27 | Payer: Self-pay | Source: Home / Self Care

## 2010-08-28 ENCOUNTER — Encounter (HOSPITAL_COMMUNITY)
Admission: RE | Admit: 2010-08-28 | Discharge: 2010-09-11 | Payer: Self-pay | Source: Home / Self Care | Attending: Oncology | Admitting: Oncology

## 2010-08-28 ENCOUNTER — Ambulatory Visit (HOSPITAL_COMMUNITY)
Admission: RE | Admit: 2010-08-28 | Discharge: 2010-09-11 | Payer: Self-pay | Source: Home / Self Care | Attending: Oncology | Admitting: Oncology

## 2010-08-29 LAB — COMPREHENSIVE METABOLIC PANEL
ALT: 13 U/L (ref 0–35)
AST: 17 U/L (ref 0–37)
Albumin: 3.1 g/dL — ABNORMAL LOW (ref 3.5–5.2)
Alkaline Phosphatase: 53 U/L (ref 39–117)
BUN: 27 mg/dL — ABNORMAL HIGH (ref 6–23)
CO2: 23 mEq/L (ref 19–32)
Calcium: 9.1 mg/dL (ref 8.4–10.5)
Chloride: 106 mEq/L (ref 96–112)
Creatinine, Ser: 1 mg/dL (ref 0.4–1.2)
GFR calc Af Amer: >60 mL/min (ref >60)
GFR calc non Af Amer: 52 mL/min — ABNORMAL LOW (ref >60)
Glucose, Bld: 189 mg/dL — ABNORMAL HIGH (ref 70–99)
Potassium: 4.4 mEq/L (ref 3.5–5.1)
Sodium: 136 mEq/L (ref 135–145)
Total Bilirubin: 0.7 mg/dL (ref 0.3–1.2)
Total Protein: 5.9 g/dL — ABNORMAL LOW (ref 6.0–8.3)

## 2010-08-29 LAB — CBC
HCT: 34.8 % — ABNORMAL LOW (ref 36.0–46.0)
Hemoglobin: 11.2 g/dL — ABNORMAL LOW (ref 12.0–15.0)
MCH: 25.4 pg — ABNORMAL LOW (ref 26.0–34.0)
MCHC: 32.2 g/dL (ref 30.0–36.0)
MCV: 78.9 fL (ref 78.0–100.0)
Platelets: 250 10*3/uL (ref 150–400)
RBC: 4.41 MIL/uL (ref 3.87–5.11)
RDW: 16.8 % — ABNORMAL HIGH (ref 11.5–15.5)
WBC: 7.8 10*3/uL (ref 4.0–10.5)

## 2010-09-02 ENCOUNTER — Encounter (HOSPITAL_COMMUNITY): Payer: Self-pay | Admitting: Oncology

## 2010-09-11 NOTE — Medication Information (Signed)
Summary: ccr-lr  Anticoagulant Therapy  Managed by: Vashti Hey, RN PCP: Dr. [name] Supervising MD: Eden Emms MD, Theron Arista Indication 1: Atrial Fibrillation (ICD-427.31) Lab Used: [facility] [place] Site: [place] INR POC 1.7  Dietary changes: no    Health status changes: no    Bleeding/hemorrhagic complications: no    Recent/future hospitalizations: no    Any changes in medication regimen? no    Recent/future dental: no  Any missed doses?: no       Is patient compliant with meds? yes       Allergies: No Known Drug Allergies  Anticoagulation Management History:      The patient is taking warfarin and comes in today for a routine follow up visit.  Positive risk factors for bleeding include an age of 75 years or older and presence of serious comorbidities.  The bleeding index is 'intermediate risk'.  Positive CHADS2 values include History of CHF, History of HTN, Age > 65 years old, and History of Diabetes.  The start date was 04/16/2005.  Anticoagulation responsible provider: Eden Emms MD, Theron Arista.  INR POC: 1.7.  Cuvette Lot#: [id number].  Exp: 10/11.    Anticoagulation Management Assessment/Plan:      The patient's current anticoagulation dose is Warfarin sodium 2.5 mg tabs: take as directed by coumadin clinic.  The target INR is 2 - 3.  The next INR is due 03/12/2010.  Anticoagulation instructions were given to patient.  Results were reviewed/authorized by Vashti Hey, RN.  She was notified by Vashti Hey RN.         Prior Anticoagulation Instructions: INR 2.0 Take coumadin 1 tablet tonight then resume 1 tablet once daily except 1/2 tablet on Mondays, Wednesdays and Fridays  Current Anticoagulation Instructions: INR 1.7 Take coumadin 1 1/2 tablets tonight then increase dose to 1 tablet once daily except 1/2 tablet on Mondays and Fridays

## 2010-09-11 NOTE — Letter (Signed)
Summary: [place] [facility], agricultural at [facility]  [address], [place] [address]   Phone: [phone]  Fax: [phone]      November 30, 2009 MRN: [id number]   [facility] [address] [place], [place]  [address]   Dear Ms. Cercone,  Your test ordered by Selena Batten has been reviewed by your physician (or physician assistant) and was found to be normal or stable. Your physician (or physician assistant) felt no changes were needed at this time.  ____ Echocardiogram  ____ Cardiac Stress Test  __x__ Lab Work  ____ Peripheral vascular study of arms, legs or neck  ____ CT scan or X-ray  ____ Lung or Breathing test  ____ Other: No change in medical treatment at this time, per Dr. Dietrich Pates.  Enclosed is a copy of your labwork for your records.   Thank you, Markes Shatswell Allyne Gee RN    [place] Bing, MD, Lenise Arena.C.Gaylord Shih, MD, F.A.C.C Lewayne Bunting, MD, F.A.C.C Nona Dell, MD, F.A.C.C Charlton Haws, MD, Lenise Arena.C.C

## 2010-09-11 NOTE — Medication Information (Signed)
Summary: ccr-lr  Anticoagulant Therapy  Managed by: Vashti Hey, RN PCP: Dr. [name] Supervising MD: Dietrich Pates MD, Molly Maduro Indication 1: Atrial Fibrillation (ICD-427.31) Lab Used: [facility] [place] Site: [place] INR POC 2.8  Dietary changes: no    Health status changes: no    Bleeding/hemorrhagic complications: no    Recent/future hospitalizations: no    Any changes in medication regimen? no    Recent/future dental: no  Any missed doses?: no       Is patient compliant with meds? yes       Allergies: No Known Drug Allergies  Anticoagulation Management History:      The patient is taking warfarin and comes in today for a routine follow up visit.  Positive risk factors for bleeding include an age of 59 years or older and presence of serious comorbidities.  The bleeding index is 'intermediate risk'.  Positive CHADS2 values include History of CHF, History of HTN, Age > 26 years old, and History of Diabetes.  The start date was 04/16/2005.  Anticoagulation responsible Izzak Fries: Dietrich Pates MD, Molly Maduro.  INR POC: 2.8.  Cuvette Lot#: [id number].  Exp: 10/11.    Anticoagulation Management Assessment/Plan:      The patient's current anticoagulation dose is Warfarin sodium 2.5 mg tabs: take as directed by coumadin clinic.  The target INR is 2 - 3.  The next INR is due 06/04/2010.  Anticoagulation instructions were given to patient.  Results were reviewed/authorized by Vashti Hey, RN.  She was notified by Vashti Hey RN.         Prior Anticoagulation Instructions: INR 2.8 Continue coumadin 2.5mg  once daily except 1.25mg  on Fridays  Current Anticoagulation Instructions: Same as Prior Instructions.

## 2010-09-11 NOTE — Medication Information (Signed)
Summary: ccr-lr  Anticoagulant Therapy  Managed by: Vashti Hey, RN Supervising MD: Dietrich Pates MD, Molly Maduro Indication 1: Atrial Fibrillation (ICD-427.31) Lab Used: [facility] [place] Site: [place] INR POC 2.7  Dietary changes: no    Health status changes: no    Bleeding/hemorrhagic complications: no    Recent/future hospitalizations: no    Any changes in medication regimen? no    Recent/future dental: no  Any missed doses?: no       Is patient compliant with meds? yes       Allergies: No Known Drug Allergies  Anticoagulation Management History:      The patient is taking warfarin and comes in today for a routine follow up visit.  Positive risk factors for bleeding include an age of 75 years or older.  The bleeding index is 'intermediate risk'.  Positive CHADS2 values include Age > 75 years old.  The start date was 04/16/2005.  Anticoagulation responsible provider: Dietrich Pates MD, Molly Maduro.  INR POC: 2.7.  Cuvette Lot#: [id number].  Exp: 10/11.    Anticoagulation Management Assessment/Plan:      The patient's current anticoagulation dose is Warfarin sodium 2.5 mg tabs: take as directed by coumadin clinic.  The target INR is 2 - 3.  The next INR is due 11/20/2009.  Anticoagulation instructions were given to patient.  Results were reviewed/authorized by Vashti Hey, RN.  She was notified by Vashti Hey RN.         Prior Anticoagulation Instructions: INR 3.0 Continue coumadin 2.5mg  once daily except 1.25mg  on Mondays, Wednesdays and Fridays  Current Anticoagulation Instructions: INR 2.7 Continue coumadin 2.5mg  once daily except 1.25mg  on Mondays, Wednesdays and Fridays

## 2010-09-11 NOTE — Letter (Signed)
Summary: Generic Letter  Architectural technologist at [facility]  [address], [place] [address]   Phone: [phone]  Fax: [phone]        Dec 21, 2009 MRN: [id number]    [facility] [address] [place], [place]  [address]    Dear Ms. Seubert,  This is the information on a DASH diet. Also enclosed is information on a low sodium diet.     Sincerely,  Teressa Lower RN  This letter has been electronically signed by your physician.

## 2010-09-11 NOTE — Medication Information (Signed)
Summary: ccr-lr  Anticoagulant Therapy  Managed by: Vashti Hey, RN PCP: Dr. [name] Supervising MD: Daleen Squibb MD, Maisie Fus Indication 1: Atrial Fibrillation (ICD-427.31) Lab Used: [facility]  Site: [place] INR POC 3.5  Dietary changes: no    Health status changes: no    Bleeding/hemorrhagic complications: no    Recent/future hospitalizations: no    Any changes in medication regimen? no    Recent/future dental: no  Any missed doses?: no       Is patient compliant with meds? yes       Allergies: No Known Drug Allergies  Anticoagulation Management History:      The patient is taking warfarin and comes in today for a routine follow up visit.  Positive risk factors for bleeding include an age of 75 years or older and presence of serious comorbidities.  The bleeding index is 'intermediate risk'.  Positive CHADS2 values include History of CHF, History of HTN, Age > 38 years old, and History of Diabetes.  The start date was 04/16/2005.  Anticoagulation responsible provider: Daleen Squibb MD, Maisie Fus.  INR POC: 3.5.  Cuvette Lot#: [id number].  Exp: 10/11.    Anticoagulation Management Assessment/Plan:      The patient's current anticoagulation dose is Warfarin sodium 2.5 mg tabs: take as directed by coumadin clinic.  The target INR is 2 - 3.  The next INR is due 07/02/2010.  Anticoagulation instructions were given to patient.  Results were reviewed/authorized by Vashti Hey, RN.  She was notified by Vashti Hey RN.         Prior Anticoagulation Instructions: INR 2.8 Continue coumadin 2.5mg  once daily except 1.25mg  on Fridays  Current Anticoagulation Instructions: INR 3.5 Hold coumadin tonight then resume 2.5mg  once daily except 1.25mg  on Fridays Increase greens/salads

## 2010-09-11 NOTE — Letter (Signed)
Summary: [facility] note  [facility] note   Imported By: Faythe Ghee 06/26/2010 15:50:34  _____________________________________________________________________  External Attachment:    Type:   Image     Comment:   External Document

## 2010-09-11 NOTE — Medication Information (Signed)
Summary: ccr-lr  Anticoagulant Therapy  Managed by: Vashti Hey, RN PCP: Dr. [name] Supervising MD: Dietrich Pates MD, Molly Maduro Indication 1: Atrial Fibrillation (ICD-427.31) Lab Used: [facility] [place] Site: [place] INR POC 2.4  Dietary changes: no    Health status changes: no    Bleeding/hemorrhagic complications: no    Recent/future hospitalizations: no    Any changes in medication regimen? no    Recent/future dental: no  Any missed doses?: no       Is patient compliant with meds? yes       Allergies: No Known Drug Allergies  Anticoagulation Management History:      The patient is taking warfarin and comes in today for a routine follow up visit.  Positive risk factors for bleeding include an age of 75 years or older and presence of serious comorbidities.  The bleeding index is 'intermediate risk'.  Positive CHADS2 values include History of CHF, History of HTN, Age > 51 years old, and History of Diabetes.  The start date was 04/16/2005.  Anticoagulation responsible provider: Dietrich Pates MD, Molly Maduro.  INR POC: 2.4.  Cuvette Lot#: [id number].  Exp: 10/11.    Anticoagulation Management Assessment/Plan:      The patient's current anticoagulation dose is Warfarin sodium 2.5 mg tabs: take as directed by coumadin clinic.  The target INR is 2 - 3.  The next INR is due 12/20/2009.  Anticoagulation instructions were given to patient.  Results were reviewed/authorized by Vashti Hey, RN.  She was notified by Vashti Hey RN.         Prior Anticoagulation Instructions: INR 2.7 Continue coumadin 2.5mg  once daily except 1.25mg  on Mondays, Wednesdays and Fridays  Current Anticoagulation Instructions: INR 2.4 Continue coumadin 2.5mg  once daily except 1.25mg  on Mondays, Wednesdays and Fridays

## 2010-09-11 NOTE — Medication Information (Signed)
Summary: ccr-lr  Anticoagulant Therapy  Managed by: Vashti Hey, RN PCP: Dr. [name] Supervising MD: Dietrich Pates MD, Molly Maduro Indication 1: Atrial Fibrillation (ICD-427.31) Lab Used: [facility] [place] Site: [place] INR POC 2.8  Dietary changes: no    Health status changes: no    Bleeding/hemorrhagic complications: no    Recent/future hospitalizations: no    Any changes in medication regimen? no    Recent/future dental: no  Any missed doses?: no       Is patient compliant with meds? yes       Allergies: No Known Drug Allergies  Anticoagulation Management History:      The patient is taking warfarin and comes in today for a routine follow up visit.  Positive risk factors for bleeding include an age of 75 years or older and presence of serious comorbidities.  The bleeding index is 'intermediate risk'.  Positive CHADS2 values include History of CHF, History of HTN, Age > 60 years old, and History of Diabetes.  The start date was 04/16/2005.  Anticoagulation responsible provider: Dietrich Pates MD, Molly Maduro.  INR POC: 2.8.  Cuvette Lot#: [id number].  Exp: 10/11.    Anticoagulation Management Assessment/Plan:      The patient's current anticoagulation dose is Warfarin sodium 2.5 mg tabs: take as directed by coumadin clinic.  The target INR is 2 - 3.  The next INR is due 05/07/2010.  Anticoagulation instructions were given to patient.  Results were reviewed/authorized by Vashti Hey, RN.  She was notified by Vashti Hey RN.         Prior Anticoagulation Instructions: INR 1.8 Take coumadin 1 1/2 tablets tonight then increase dose to 1 tablet once daily except 1/2 tablet on Fridays  Current Anticoagulation Instructions: INR 2.8 Continue coumadin 2.5mg  once daily except 1.25mg  on Fridays

## 2010-09-11 NOTE — Medication Information (Signed)
Summary: ccr-lr  Anticoagulant Therapy  Managed by: Vashti Hey, RN PCP: Dr. [name] Supervising MD: Dietrich Pates MD, Molly Maduro Indication 1: Atrial Fibrillation (ICD-427.31) Lab Used: [facility] [place] Site: [place] INR POC 1.8  Dietary changes: no    Health status changes: no    Bleeding/hemorrhagic complications: no    Recent/future hospitalizations: no    Any changes in medication regimen? no    Recent/future dental: no  Any missed doses?: no       Is patient compliant with meds? yes       Allergies: No Known Drug Allergies  Anticoagulation Management History:      The patient is taking warfarin and comes in today for a routine follow up visit.  Positive risk factors for bleeding include an age of 75 years or older and presence of serious comorbidities.  The bleeding index is 'intermediate risk'.  Positive CHADS2 values include History of CHF, History of HTN, Age > 12 years old, and History of Diabetes.  The start date was 04/16/2005.  Anticoagulation responsible provider: Dietrich Pates MD, Molly Maduro.  INR POC: 1.8.  Cuvette Lot#: [id number].  Exp: 10/11.    Anticoagulation Management Assessment/Plan:      The patient's current anticoagulation dose is Warfarin sodium 2.5 mg tabs: take as directed by coumadin clinic.  The target INR is 2 - 3.  The next INR is due 04/09/2010.  Anticoagulation instructions were given to patient.  Results were reviewed/authorized by Vashti Hey, RN.  She was notified by Vashti Hey RN.         Prior Anticoagulation Instructions: INR 1.7 Take coumadin 1 1/2 tablets tonight then increase dose to 1 tablet once daily except 1/2 tablet on Mondays and Fridays  Current Anticoagulation Instructions: INR 1.8 Take coumadin 1 1/2 tablets tonight then increase dose to 1 tablet once daily except 1/2 tablet on Fridays

## 2010-09-11 NOTE — Medication Information (Signed)
Summary: [facility]  Anticoagulant Therapy  Managed by: Vashti Hey, RN PCP: Dr. [name] Supervising MD: Diona Browner MD, Remi Deter Indication 1: Atrial Fibrillation (ICD-427.31) Lab Used: [facility] [place] Site: [place] INR POC 1.9  Dietary changes: no    Health status changes: no    Bleeding/hemorrhagic complications: no    Recent/future hospitalizations: no    Any changes in medication regimen? no    Recent/future dental: no  Any missed doses?: no       Is patient compliant with meds? yes       Allergies: No Known Drug Allergies  Anticoagulation Management History:      The patient is taking warfarin and comes in today for a routine follow up visit.  Positive risk factors for bleeding include an age of 75 years or older and presence of serious comorbidities.  The bleeding index is 'intermediate risk'.  Positive CHADS2 values include History of CHF, History of HTN, Age > 75 years old, and History of Diabetes.  The start date was 04/16/2005.  Anticoagulation responsible provider: Diona Browner MD, Remi Deter.  INR POC: 1.9.  Cuvette Lot#: [id number].  Exp: 10/11.    Anticoagulation Management Assessment/Plan:      The patient's current anticoagulation dose is Warfarin sodium 2.5 mg tabs: take as directed by coumadin clinic.  The target INR is 2 - 3.  The next INR is due 01/17/2010.  Anticoagulation instructions were given to patient.  Results were reviewed/authorized by Vashti Hey, RN.  She was notified by Vashti Hey RN.         Prior Anticoagulation Instructions: INR 2.4 Continue coumadin 2.5mg  once daily except 1.25mg  on Mondays, Wednesdays and Fridays  Current Anticoagulation Instructions: INR 1.9 Take coumadin 1 tablet tonight then resume 1 tablet once daily except 1/2 tablet on Mondays, Wednesdays and Fridays

## 2010-09-11 NOTE — Assessment & Plan Note (Signed)
Summary: NURSE VISIT  Nurse Visit   Vital Signs:  Patient profile:   77 year old female Pulse rate:   79 / minute BP sitting:   125 / 62  (right arm)  Vitals Entered By: Dreama Saa, CNA (Dec 20, 2009 3:56 PM)  Visit Type:  1 month nurse visit Primary Provider:  Dr. [name]   History of Present Illness: S: 1 month nurse visit B: pt was seen in our office on 11/22/09, stopped clonidine and started cardura titrated to 4mg  daiy A:  on 11/30/09 pt called our office and stopped taking cardura, went back on clonidine, would not consider trying cardura any longer (made her dizzy and tired) bp diary she was asked to keep is 3 readings pt is noncompliant 130/71    76 123/70    83 116/69    78 R: DASH diet given to pt  Noted.   Bing, M.D.    Current Medications (verified): 1)  Diltiazem Hcl Cr 240 Mg Xr24h-Cap (Diltiazem Hcl) .... Take 1 Tab By Mouth Daily 2)  Warfarin Sodium 2.5 Mg Tabs (Warfarin Sodium) .... Take As Directed By Coumadin Clinic 3)  Furosemide 80 Mg Tabs (Furosemide) .... Take 1 Tablet By Mouth Once Daily 4)  Synthroid 50 Mcg Tabs (Levothyroxine Sodium) .... Take 1/2 Tab Daily 5)  Evista 60 Mg Tabs (Raloxifene Hcl) .... Take 1 Tab Daily 6)  Glipizide 10 Mg Xr24h-Tab (Glipizide) .... Take 1/2 Tab Daily 7)  Potassium Chloride 20 Meq/81ml (10%) Liqd (Potassium Chloride) .... Take 1 Tab Daily 8)  Fexofenadine Hcl 180 Mg Tabs (Fexofenadine Hcl) .... Take 1 Tab Daily 9)  Bystolic 10 Mg Tabs (Nebivolol Hcl) .... Take 1 Tab Daily 10)  Prilosec 20 Mg Cpdr (Omeprazole) .... Take 1 Tab Two Times A Day 11)  Cod Liver Oil  Caps (Cod Liver Oil) .... Take 1 Tab Daily 12)  Aspir-Low 81 Mg Tbec (Aspirin) .... Take 1 Tab Daily 13)  Icaps Plus  Tabs (Multiple Vitamins-Minerals) .... Take 1 Tab Daily 14)  Oscal 500/200 D-3 500-200 Mg-Unit Tabs (Calcium-Vitamin D) .... Take 1 Tab Daily 15)  Losartan Potassium 50 Mg Tabs (Losartan Potassium) .... Take 1 Tablet Daily 16)   Clonidine Hcl 0.1 Mg Tabs (Clonidine Hcl) .... Take 1/2 Tab Two Times A Day  Allergies (verified): No Known Drug Allergies

## 2010-09-11 NOTE — Miscellaneous (Signed)
Summary: LABS CBCD,CMP,TSH,T4,A1C,04/21/2009  Clinical Lists Changes  Observations: Added new observation of CALCIUM: 9.2 mg/dL (57/32/2025 42:70) Added new observation of ALBUMIN: 4.0 g/dL (62/37/6283 15:17) Added new observation of PROTEIN, TOT: 7.2 g/dL (61/60/7371 06:26) Added new observation of SGPT (ALT): 9 units/L (04/21/2009 10:41) Added new observation of SGOT (AST): 13 units/L (04/21/2009 10:41) Added new observation of ALK PHOS: 63 units/L (04/21/2009 10:41) Added new observation of CREATININE: 0.97 mg/dL (94/85/4627 03:50) Added new observation of BUN: 30 mg/dL (09/38/1829 93:71) Added new observation of BG RANDOM: 120 mg/dL (69/67/8938 10:17) Added new observation of CO2 PLSM/SER: 20 meq/L (04/21/2009 10:41) Added new observation of CL SERUM: 104 meq/L (04/21/2009 10:41) Added new observation of K SERUM: 4.5 meq/L (04/21/2009 10:41) Added new observation of NA: 142 meq/L (04/21/2009 10:41) Added new observation of PLATELETK/UL: 287 K/uL (04/21/2009 10:41) Added new observation of MCV: 82.8 fL (04/21/2009 10:41) Added new observation of HCT: 40.5 % (04/21/2009 10:41) Added new observation of HGB: 12.5 g/dL (51/09/5850 77:82) Added new observation of WBC COUNT: 7.8 10*3/microliter (04/21/2009 10:41) Added new observation of TSH: 1.181 microintl units/mL (04/21/2009 10:41) Added new observation of T4, FREE: 0.98 ng/dL (42/35/3614 43:15) Added new observation of HGBA1C: 6.3 % (04/21/2009 10:41)

## 2010-09-11 NOTE — Assessment & Plan Note (Signed)
Summary: past due for f/u per Lynn/tg  Medications Added SYNTHROID 50 MCG TABS (LEVOTHYROXINE SODIUM) take 1/2 tab daily CLONIDINE HCL 0.1 MG TABS (CLONIDINE HCL) take 1/2 tab two times a day EVISTA 60 MG TABS (RALOXIFENE HCL) take 1 tab daily GLIPIZIDE 10 MG XR24H-TAB (GLIPIZIDE) take 1/2 tab daily POTASSIUM CHLORIDE 20 MEQ/15ML (10%) LIQD (POTASSIUM CHLORIDE) take 1 tab daily TEKTURNA 150 MG TABS (ALISKIREN FUMARATE) take 1 tab daily FEXOFENADINE HCL 180 MG TABS (FEXOFENADINE HCL) take 1 tab daily BYSTOLIC 10 MG TABS (NEBIVOLOL HCL) take 1 tab daily PRILOSEC 20 MG CPDR (OMEPRAZOLE) take 1 tab two times a day COD LIVER OIL  CAPS (COD LIVER OIL) take 1 tab daily ASPIR-LOW 81 MG TBEC (ASPIRIN) take 1 tab daily ICAPS PLUS  TABS (MULTIPLE VITAMINS-MINERALS) take 1 tab daily OSCAL 500/200 D-3 500-200 MG-UNIT TABS (CALCIUM-VITAMIN D) take 1 tab daily DOXAZOSIN MESYLATE 2 MG TABS (DOXAZOSIN MESYLATE) take 1/2 tablet by mouth x1 then 1 tablet by mouth x1 then  increase to 4mg  daily DOXAZOSIN MESYLATE 4 MG TABS (DOXAZOSIN MESYLATE) Take 1 tablet by mouth once a day      Allergies Added: NKDA  Visit Type:  Follow-up Primary Provider:  Dr. [name]   History of Present Illness: Return visit is scheduled for this very nice 77 year old woman with a history of congestive heart failure despite normal left ventricular systolic function.  Although she was anticipated to return in 8 months, it has been more than a year since her last visit.  She has done very well with limited mobility due to orthopaedic problems, but no exertional dyspnea nor chest discomfort.  She has fatigue, and activity is generally limited.  She reports occasional very brief episodes of pain over the right parietal region of her scalp.  She has chronic left leg pain and is to see a podiatrist in the near future.  She has had edema, but this has been stable and tolerable.  She has no orthopnea nor PND.  Current Medications  (verified): 1)  Diltiazem Hcl Cr 240 Mg Xr24h-Cap (Diltiazem Hcl) .... Take 1 Tab By Mouth Daily 2)  Warfarin Sodium 2.5 Mg Tabs (Warfarin Sodium) .... Take As Directed By Coumadin Clinic 3)  Furosemide 80 Mg Tabs (Furosemide) .... Take 1 Tablet By Mouth Once Daily 4)  Synthroid 50 Mcg Tabs (Levothyroxine Sodium) .... Take 1/2 Tab Daily 5)  Evista 60 Mg Tabs (Raloxifene Hcl) .... Take 1 Tab Daily 6)  Glipizide 10 Mg Xr24h-Tab (Glipizide) .... Take 1/2 Tab Daily 7)  Potassium Chloride 20 Meq/63ml (10%) Liqd (Potassium Chloride) .... Take 1 Tab Daily 8)  Tekturna 150 Mg Tabs (Aliskiren Fumarate) .... Take 1 Tab Daily 9)  Fexofenadine Hcl 180 Mg Tabs (Fexofenadine Hcl) .... Take 1 Tab Daily 10)  Bystolic 10 Mg Tabs (Nebivolol Hcl) .... Take 1 Tab Daily 11)  Prilosec 20 Mg Cpdr (Omeprazole) .... Take 1 Tab Two Times A Day 12)  Cod Liver Oil  Caps (Cod Liver Oil) .... Take 1 Tab Daily 13)  Aspir-Low 81 Mg Tbec (Aspirin) .... Take 1 Tab Daily 14)  Icaps Plus  Tabs (Multiple Vitamins-Minerals) .... Take 1 Tab Daily 15)  Oscal 500/200 D-3 500-200 Mg-Unit Tabs (Calcium-Vitamin D) .... Take 1 Tab Daily 16)  Doxazosin Mesylate 2 Mg Tabs (Doxazosin Mesylate) .... Take 1/2 Tablet By Mouth X1 Then 1 Tablet By Mouth X1 Then  Increase To 4mg  Daily 17)  Doxazosin Mesylate 4 Mg Tabs (Doxazosin Mesylate) .... Take 1 Tablet By  Mouth Once A Day  Allergies (verified): No Known Drug Allergies  Past History:  PMH, FH, and Social History reviewed and updated.  Past Medical History: Atrial fibrillation, constant since 2007; Anticoagulation ASCVD: Cath in 2001-40% LAD and 50% RCA; stent implanted for 80% LAD in 12/2002; residual 50% LAD,       80% small D1, 70% small OM150% ostial RCA, normal LV Congestive heart failure with preserved LV systolic function-2008; moderate LVH Moderate mitral valve prolapse with moderate mitral regurgitation Hypertension PVD-ABIs of 0.64 and 0.59, right and left leg in  2009 CVD-plaque without focal disease in 2006 AODM-no insulin Hypothyroid Carcinoma of the breast-left mastectomy in 1995 Osteoporosis Edema History of herpes zoster Chronic hoarseness  Review of Systems       See history of present illness.  Vital Signs:  Patient profile:   77 year old female Height:      60 inches Weight:      147 pounds Pulse rate:   99 / minute BP sitting:   122 / 73  (right arm)  Vitals Entered By: Dreama Saa, CNA (November 22, 2009 2:02 PM)  Physical Exam  General:    Well developed; no acute distress:Very sharp mentally   Neck-No JVD; no carotid bruits: Lungs-No tachypnea, no rales; no rhonchi; no wheezes :moderate kyphosis Cardiovascular-normal PMI; normal S1 and S2: modest systolic ejection murmur Abdomen-BS normal; firm and non-tender without masses or organomegaly:  Musculoskeletal-No deformities, no cyanosis or clubbing: Neurologic-Normal cranial nerves; symmetric strength and tone:  Skin-Warm, no significant lesions: Extremities-1-2+ pedal edema on the left; 1+ on the right; compression stockings in place.     Impression & Recommendations:  Problem # 1:  ATHEROSCLEROTIC CARDIOVASCULAR DISEASE (ICD-429.2) Patient had remote percutaneous intervention, but no symptoms nor issues with this problem for the past 7 years.  Continued observation is appropriate.  Problem # 2:  MITRAL VALVE PROLAPSE-MODERATE MR (ICD-424.0) Murmur is unimpressive.  While her valvular disease could be contributing to congestive heart failure, she is well compensated at the present time, and no intervention is warranted.  Problem # 3:  ATRIAL FIBRILLATION (ICD-427.31) Heart rate control is good.  Current therapy will be continued.  She has not fallen in the past 4 and continues on warfarin.  A CBC and stool for Hemoccults will be checked.  Problem # 4:  HYPERTENSION, BENIGN (ICD-401.1)  Blood pressure control is good, but the patient complains of dry mouth due to  clonidine.  We will switch her to doxazosin.  I will reassess this nice woman in 8 months.  Other Orders: Future Orders: T-CBC w/Diff ([id number]) ... 11/27/2009 T-Lipid Profile [id number]) ... 11/27/2009 T-Comprehensive Metabolic Panel [id number]) ... 11/27/2009  Patient Instructions: 1)  Your physician recommends that you schedule a follow-up appointment in: 8 months 2)  Your physician recommends that you return for lab work in: next week 3)  Your physician has recommended you make the following change in your medication:  stop clonidine, start cardura( doxazosin) take 1 mg  x1 day, then 2mg  x1 day, then 4mg  daily thereafter 4)  You have been referred to nurse visit, bring blood pressure diary to nurse visit 5)  Your physician has requested that you regularly monitor and record your blood pressure readings at home.  Please use the same machine at the same time of day to check your readings and record them to bring to your follow-up visit. take and record your blood pressure daily Prescriptions: DOXAZOSIN MESYLATE 4 MG TABS (DOXAZOSIN MESYLATE) Take  1 tablet by mouth once a day  #30 x 6   Entered by:   Teressa Lower RN   Authorized by:   Kathlen Brunswick, MD, [facility]   Signed by:   Teressa Lower RN on 11/22/2009   Method used:   Electronically to        [facility] (retail)       [address]       [place], [place]  [address]       Ph: [phone]       Fax: [phone]   RxID:   [id number] DOXAZOSIN MESYLATE 2 MG TABS (DOXAZOSIN MESYLATE) take 1/2 tablet by mouth x1 then 1 tablet by mouth x1 then  increase to 4mg  daily  #2 x 0   Entered by:   Teressa Lower RN   Authorized by:   Kathlen Brunswick, MD, [facility]   Signed by:   Teressa Lower RN on 11/22/2009   Method used:   Electronically to        [facility] (retail)       [address]       [place], [place]  [address]       Ph: [phone]       Fax:  [phone]   RxID:   [id number]

## 2010-09-11 NOTE — Letter (Signed)
Summary: [facility] Work Engineer, agricultural at [employer]  [address], [place] [address]   Phone: [phone]  Fax: [phone]     November 22, 2009 MRN: [id number]   [facility] [address] [place], [place]  [address]      YOUR LAB WORK IS DUE   _________________MONDAY, APRIL 18, 2011________________________  Please go to [facility], located across the street from [facility] on the second floor.  Hours are Monday - Friday 7am until 7:30pm         Saturday 8am until 12noon    _X_  DO NOT EAT OR DRINK AFTER MIDNIGHT EVENING PRIOR TO LABWORK  __ YOUR LABWORK IS NOT FASTING --YOU MAY EAT PRIOR TO LABWORK

## 2010-09-11 NOTE — Medication Information (Signed)
Summary: ccr-lr  Anticoagulant Therapy  Managed by: Vashti Hey, RN PCP: Dr. [name] Supervising MD: Diona Browner MD, Remi Deter Indication 1: Atrial Fibrillation (ICD-427.31) Lab Used: [facility] [place] Site: [place] INR POC 2.0  Dietary changes: no    Health status changes: no    Bleeding/hemorrhagic complications: no    Recent/future hospitalizations: no    Any changes in medication regimen? no    Recent/future dental: no  Any missed doses?: no       Is patient compliant with meds? yes       Allergies: No Known Drug Allergies  Anticoagulation Management History:      The patient is taking warfarin and comes in today for a routine follow up visit.  Positive risk factors for bleeding include an age of 75 years or older and presence of serious comorbidities.  The bleeding index is 'intermediate risk'.  Positive CHADS2 values include History of CHF, History of HTN, Age > 16 years old, and History of Diabetes.  The start date was 04/16/2005.  Anticoagulation responsible provider: Diona Browner MD, Remi Deter.  INR POC: 2.0.  Cuvette Lot#: [id number].  Exp: 10/11.    Anticoagulation Management Assessment/Plan:      The patient's current anticoagulation dose is Warfarin sodium 2.5 mg tabs: take as directed by coumadin clinic.  The target INR is 2 - 3.  The next INR is due 02/14/2010.  Anticoagulation instructions were given to patient.  Results were reviewed/authorized by Vashti Hey, RN.  She was notified by Vashti Hey RN.         Prior Anticoagulation Instructions: INR 1.9 Take coumadin 1 tablet tonight then resume 1 tablet once daily except 1/2 tablet on Mondays, Wednesdays and Fridays  Current Anticoagulation Instructions: INR 2.0 Take coumadin 1 tablet tonight then resume 1 tablet once daily except 1/2 tablet on Mondays, Wednesdays and Fridays

## 2010-09-11 NOTE — Medication Information (Signed)
Summary: ccr-lr  Anticoagulant Therapy  Managed by: [name] Hey, RN Supervising MD: Dietrich Pates MD, Molly Maduro Indication 1: Atrial Fibrillation (ICD-427.31) Lab Used: [facility] [place] Site: [place] INR POC 3.0  Dietary changes: no    Health status changes: no    Bleeding/hemorrhagic complications: no    Recent/future hospitalizations: no    Any changes in medication regimen? no    Recent/future dental: no  Any missed doses?: no       Is patient compliant with meds? yes       Allergies: No Known Drug Allergies  Anticoagulation Management History:      The patient is taking warfarin and comes in today for a routine follow up visit.  Positive risk factors for bleeding include an age of 75 years or older.  The bleeding index is 'intermediate risk'.  Positive CHADS2 values include Age > 69 years old.  The start date was 04/16/2005.  Anticoagulation responsible provider: Dietrich Pates MD, Molly Maduro.  INR POC: 3.0.  Cuvette Lot#: [id number].  Exp: 10/11.    Anticoagulation Management Assessment/Plan:      The patient's current anticoagulation dose is Warfarin sodium 2.5 mg tabs: take as directed by coumadin clinic.  The target INR is 2 - 3.  The next INR is due 10/23/2009.  Anticoagulation instructions were given to patient.  Results were reviewed/authorized by [name] Hey, RN.  She was notified by [name] Hey RN.         Prior Anticoagulation Instructions: INR 2.7 Continue coumadin 1 tablet once daily except 1/2 tablet on Mondays, Wednesdays and Fridays  Current Anticoagulation Instructions: INR 3.0 Continue coumadin 2.5mg  once daily except 1.25mg  on Mondays, Wednesdays and Fridays

## 2010-09-11 NOTE — Medication Information (Signed)
Summary: ccr-lr  Anticoagulant Therapy  Managed by: [name] Hey, RN PCP: Dr. [name] Supervising MD: Dietrich Pates MD, Molly Maduro Indication 1: Atrial Fibrillation (ICD-427.31) Lab Used: [facility] [place] Site: [place] INR POC 3.4  Dietary changes: no    Health status changes: no    Bleeding/hemorrhagic complications: no    Recent/future hospitalizations: no    Any changes in medication regimen? no    Recent/future dental: no  Any missed doses?: no       Is patient compliant with meds? yes       Allergies: No Known Drug Allergies  Anticoagulation Management History:      The patient is taking warfarin and comes in today for a routine follow up visit.  Positive risk factors for bleeding include an age of 75 years or older and presence of serious comorbidities.  The bleeding index is 'intermediate risk'.  Positive CHADS2 values include History of CHF, History of HTN, Age > 75 years old, and History of Diabetes.  The start date was 04/16/2005.  Anticoagulation responsible provider: Dietrich Pates MD, Molly Maduro.  INR POC: 3.4.  Exp: 10/11.    Anticoagulation Management Assessment/Plan:      The patient's current anticoagulation dose is Warfarin sodium 2.5 mg tabs: take as directed by coumadin clinic.  The target INR is 2 - 3.  The next INR is due 07/30/2010.  Anticoagulation instructions were given to patient.  Results were reviewed/authorized by [name] Hey, RN.  She was notified by [name] Hey RN.         Prior Anticoagulation Instructions: INR 3.5 Hold coumadin tonight then resume 2.5mg  once daily except 1.25mg  on Fridays Increase greens/salads  Current Anticoagulation Instructions: INR 3.4 Decrease coumadin to 2.5mg  once daily except 1.25mg  on Mondays and Fridays

## 2010-09-11 NOTE — Medication Information (Signed)
Summary: ccr-lr  Anticoagulant Therapy  Managed by: Vashti Hey, RN Supervising MD: Dietrich Pates MD, Molly Maduro Indication 1: Atrial Fibrillation (ICD-427.31) Lab Used: [facility] [place] Site: [place] INR POC 2.7  Dietary changes: no    Health status changes: no    Bleeding/hemorrhagic complications: no    Recent/future hospitalizations: no    Any changes in medication regimen? no    Recent/future dental: no  Any missed doses?: no       Is patient compliant with meds? yes       Allergies: No Known Drug Allergies  Anticoagulation Management History:      The patient is taking warfarin and comes in today for a routine follow up visit.  Positive risk factors for bleeding include an age of 75 years or older.  The bleeding index is 'intermediate risk'.  Positive CHADS2 values include Age > 75 years old.  The start date was 04/16/2005.  Anticoagulation responsible provider: Dietrich Pates MD, Molly Maduro.  INR POC: 2.7.  Cuvette Lot#: [id number].  Exp: 10/11.    Anticoagulation Management Assessment/Plan:      The patient's current anticoagulation dose is Warfarin sodium 2.5 mg tabs: take as directed by coumadin clinic.  The target INR is 2 - 3.  The next INR is due 09/25/2009.  Anticoagulation instructions were given to patient.  Results were reviewed/authorized by Vashti Hey, RN.  She was notified by Vashti Hey RN.         Prior Anticoagulation Instructions: INR 1.9 Increase coumadin to 1 tablet once daily except 1/2 tablet on Mondays, Wednesdays and Fridays  Current Anticoagulation Instructions: INR 2.7 Continue coumadin 1 tablet once daily except 1/2 tablet on Mondays, Wednesdays and Fridays

## 2010-09-11 NOTE — Progress Notes (Signed)
Summary: Medication concerns   Phone Note Call from Patient   Caller: Patient Summary of Call: S:;pt states that her last visit w/Dr.Toney Difatta her Clonidine was changed to Doxazosin Mesalate/she states she is going to go back on Clonidine b/c this medicine is making her real weak/tg B:last office visit 11/22/2009, stopped clonidine and started cardura 1mg  x1, 2mg  x1, then 4mg  daily A:  pt c/o feeling tired and very dizzy,pt refused to continue this medication and went back on clonidine at 0.1mg  1/2 tab two times a day, labs drawn 11/27/09 cbc,cmp,lipids BUN 26,HGB 11.3 R: pt scheduled for nurse visit 12/20/2009   Teressa Lower RN  December 04, 2009 11:27 AM   Initial call taken by: Raechel Ache [facility],  November 30, 2009 4:17 PM

## 2010-09-11 NOTE — Miscellaneous (Signed)
Summary: labs cbcd,cmp,lipid,11/27/2009  Clinical Lists Changes  Observations: Added new observation of CALCIUM: 9.1 mg/dL (16/05/9603 54:09) Added new observation of ALBUMIN: 4.0 g/dL (81/19/1478 29:56) Added new observation of PROTEIN, TOT: 6.6 g/dL (21/30/8657 84:69) Added new observation of SGPT (ALT): 10 units/L (11/27/2009 16:08) Added new observation of SGOT (AST): 14 units/L (11/27/2009 16:08) Added new observation of ALK PHOS: 47 units/L (11/27/2009 16:08) Added new observation of CREATININE: 0.99 mg/dL (62/95/2841 32:44) Added new observation of BUN: 26 mg/dL (08/14/7251 66:44) Added new observation of BG RANDOM: 63 mg/dL (03/47/4259 56:38) Added new observation of CO2 PLSM/SER: 25 meq/L (11/27/2009 16:08) Added new observation of CL SERUM: 102 meq/L (11/27/2009 16:08) Added new observation of K SERUM: 4.7 meq/L (11/27/2009 16:08) Added new observation of NA: 137 meq/L (11/27/2009 16:08) Added new observation of LDL: 58 mg/dL (75/64/3329 51:88) Added new observation of HDL: 41 mg/dL (41/66/0630 16:01) Added new observation of TRIGLYC TOT: 108 mg/dL (09/32/3557 32:20) Added new observation of CHOLESTEROL: 121 mg/dL (25/42/7062 37:62) Added new observation of PLATELETK/UL: 271 K/uL (11/27/2009 16:08) Added new observation of MCV: 79.5 fL (11/27/2009 16:08) Added new observation of HCT: 37.2 % (11/27/2009 16:08) Added new observation of HGB: 11.3 g/dL (83/15/1761 60:73) Added new observation of WBC COUNT: 7.9 10*3/microliter (11/27/2009 16:08)

## 2010-09-13 NOTE — Letter (Signed)
Summary:  Results Engineer, agricultural at [employer]  [address], [place] [address]   Phone: [phone]  Fax: [phone]      August 15, 2010 MRN: [id number]   [facility] [address] [place], [place]  [address]   Dear Ms. Doughten,  Your test ordered by Selena Batten has been reviewed by your physician (or physician assistant) and was found to be normal or stable. Your physician (or physician assistant) felt no changes were needed at this time.  ____ Echocardiogram  ____ Cardiac Stress Test  __x__ Lab Work  ____ Peripheral vascular study of arms, legs or neck  ____ CT scan or X-ray  ____ Lung or Breathing test  _x___ Other: stool cards negative x3  No change in medical treatment at this time, per Zonia Kief.  Thank you, Shery Wauneka Allyne Gee RN    Lenawee Bing, MD, Lenise Arena.C.Gaylord Shih, MD, F.A.C.C Lewayne Bunting, MD, F.A.C.C Nona Dell, MD, F.A.C.C Charlton Haws, MD, Lenise Arena.C.C

## 2010-09-13 NOTE — Medication Information (Signed)
Summary: ccr-lr  Anticoagulant Therapy  Managed by: Vashti Hey, RN PCP: Dr. [name] Supervising MD: Diona Browner MD, Remi Deter Indication 1: Atrial Fibrillation (ICD-427.31) Lab Used: [facility] [place] Site: [place] INR POC 3.3  Dietary changes: no    Health status changes: no    Bleeding/hemorrhagic complications: no    Recent/future hospitalizations: no    Any changes in medication regimen? no    Recent/future dental: no  Any missed doses?: no       Is patient compliant with meds? yes       Allergies: No Known Drug Allergies  Anticoagulation Management History:      The patient is taking warfarin and comes in today for a routine follow up visit.  Positive risk factors for bleeding include an age of 75 years or older and presence of serious comorbidities.  The bleeding index is 'intermediate risk'.  Positive CHADS2 values include History of CHF, History of HTN, Age > 40 years old, and History of Diabetes.  The start date was 04/16/2005.  Anticoagulation responsible provider: Diona Browner MD, Remi Deter.  INR POC: 3.3.  Cuvette Lot#: [id number].  Exp: 10/11.    Anticoagulation Management Assessment/Plan:      The patient's current anticoagulation dose is Warfarin sodium 2.5 mg tabs: 2.5mg  once daily except 1.25mg  on Mondays and Fridays or as directed by anticoagulation clinic.  The target INR is 2 - 3.  The next INR is due 09/24/2010.  Anticoagulation instructions were given to patient.  Results were reviewed/authorized by Vashti Hey, RN.  She was notified by Vashti Hey RN.         Prior Anticoagulation Instructions: INR 2.5 Continue coumadin 2.5mg  once daily except 1.25mg  on Mondays and Fridays  Current Anticoagulation Instructions: INR 3.3 Hold coumadin tonight then resume 2.5mg  once daily except 1.25mg  on Mondays and Fridays

## 2010-09-13 NOTE — Assessment & Plan Note (Signed)
Summary: Nana.Monks  Medications Added CRESTOR 10 MG TABS (ROSUVASTATIN CALCIUM) Take 1 tablet by mouth at bedtime FLEXERIL 5 MG TABS (CYCLOBENZAPRINE HCL) Take 1 tablet by mouth two times a day as needed, muscle spasms      Allergies Added: NKDA  Visit Type:  Follow-up Primary Provider:  Dr. [name]   History of Present Illness: Mrs. Vanwinkle is a 77 y/o CF we are following for continued treatment and assessment of diastolic CHF, atrial fib on coumadin,CAD with remote PTCA, and hypertension. She has a history of diabetes and hypercholesterolemia which is followed by Dr. Margo Aye.  She comes today with no cardiac complaints, but is having muscle aches and pains in the Left neck and trapezious region. Chronic foot pain secondary to diabetic neuralgia.  She otherwise is limited in mobility and uses a walker to ambulate when out of the house, and furniture when at home. She has not fallen or had any other issues.  She has two sons who check on her daily.  Current Medications (verified): 1)  Diltiazem Hcl Cr 240 Mg Xr24h-Cap (Diltiazem Hcl) .... Take 1 Tab By Mouth Daily 2)  Warfarin Sodium 2.5 Mg Tabs (Warfarin Sodium) .... 2.5mg  Once Daily Except 1.25mg  On Mondays and Fridays or As Directed By Anticoagulation Clinic 3)  Furosemide 80 Mg Tabs (Furosemide) .... Take 1 Tablet By Mouth Once Daily 4)  Synthroid 50 Mcg Tabs (Levothyroxine Sodium) .... Take 1/2 Tab Daily 5)  Evista 60 Mg Tabs (Raloxifene Hcl) .... Take 1 Tab Daily 6)  Glipizide 10 Mg Xr24h-Tab (Glipizide) .... Take 1/2 Tab Daily 7)  Potassium Chloride 20 Meq/35ml (10%) Liqd (Potassium Chloride) .... Take 1 Tab Daily 8)  Fexofenadine Hcl 180 Mg Tabs (Fexofenadine Hcl) .... Take 1 Tab Daily 9)  Bystolic 10 Mg Tabs (Nebivolol Hcl) .... Take 1 Tab Daily 10)  Prilosec 20 Mg Cpdr (Omeprazole) .... Take 1 Tab Two Times A Day 11)  Cod Liver Oil  Caps (Cod Liver Oil) .... Take 1 Tab Daily 12)  Aspir-Low 81 Mg Tbec (Aspirin) .... Take 1 Tab Daily 13)   Icaps Plus  Tabs (Multiple Vitamins-Minerals) .... Take 1 Tab Daily 14)  Oscal 500/200 D-3 500-200 Mg-Unit Tabs (Calcium-Vitamin D) .... Take 1 Tab Daily 15)  Losartan Potassium 50 Mg Tabs (Losartan Potassium) .... Take 1 Tablet Daily 16)  Clonidine Hcl 0.1 Mg Tabs (Clonidine Hcl) .... Take 1/2 Tab Two Times A Day 17)  Crestor 10 Mg Tabs (Rosuvastatin Calcium) .... Take 1 Tablet By Mouth At Bedtime  Allergies (verified): No Known Drug Allergies  Comments:  Nurse/Medical Assistant: patient reviewed med list from previous ov and stated that all meds are the same as from previous ov patient uses [facility] [place] no recent lab workpatient was given crestor 10 mg daily  Past History:  Past medical, surgical, family and social histories (including risk factors) reviewed, and no changes noted (except as noted below).  Past Medical History: Reviewed history from 11/22/2009 and no changes required. Atrial fibrillation, constant since 2007; Anticoagulation ASCVD: Cath in 2001-40% LAD and 50% RCA; stent implanted for 80% LAD in 12/2002; residual 50% LAD,       80% small D1, 70% small OM150% ostial RCA, normal LV Congestive heart failure with preserved LV systolic function-2008; moderate LVH Moderate mitral valve prolapse with moderate mitral regurgitation Hypertension PVD-ABIs of 0.64 and 0.59, right and left leg in 2009 CVD-plaque without focal disease in 2006 AODM-no insulin Hypothyroid Carcinoma of the breast-left mastectomy in  1995 Osteoporosis Edema History of herpes zoster Chronic hoarseness  Past Surgical History: Reviewed history from 11/21/2009 and no changes required. ORIF of left hip 05/22/2006 [facility] Left mastectomy-1995 Cholecystectomy-2006 Hysterectomy without oophorectomy Bilateral cataract surgery   Family History: Reviewed history and no changes required.  Social History: Reviewed history from 11/15/2009 and no changes required. Retired  Widowed    Tobacco Use - No.  Alcohol Use - no Regular Exercise - no Drug Use - no  Review of Systems       Left shoulder muscle pain and neck pain which is constant.  Chronic foot pain.  All other systems have been reviewed and are negative unless stated above.   Vital Signs:  Patient profile:   77 year old female Weight:      149 pounds BMI:     29.20 O2 Sat:      96 % on Room air Pulse rate:   91 / minute BP sitting:   123 / 77  (right arm)  Vitals Entered By: Dreama Saa, CNA (July 30, 2010 2:20 PM)  O2 Flow:  Room air  Physical Exam  General:  Well developed, well nourished, in no acute distress.normal appearance.   Lungs:  Clear bilaterally to auscultation and percussion. Heart:  Irregularly irregular with 1/6 systolic murmur Abdomen:  Bowel sounds positive; abdomen soft and non-tender without masses, organomegaly, or hernias noted. No hepatosplenomegaly. Msk:  Kyphosis noted. Left shoulder discomfort with movement and palpation in the left nect as well with movement. Pulses:  pulses normal in all 4 extremities Extremities:  1+ left pedal edema.  Mild enlargement of left ankle, nonedematous, appears to be arthritic.  Neurologic:  Alert and oriented x 3. Psych:  depressed affect.     EKG  Procedure date:  07/30/2010  Findings:      Atrial fibrillation with an uncontrolled ventricular response rate of: 102 bpm.  On furthe pulse assessment, HR in the high 80's.  Impression & Recommendations:  Problem # 1:  ATHEROSCLEROTIC CARDIOVASCULAR DISEASE (ICD-429.2) She is asymptomatic from cardiac standpoint. No decrease in stamina or increased shortness of breath.  Continue to treat risk factors.  Problem # 2:  ATRIAL FIBRILLATION (ICD-427.31) Continue rate control and coumadin.  She is followed in our clinic.Marland Kitchen Her updated medication list for this problem includes:    Warfarin Sodium 2.5 Mg Tabs (Warfarin sodium) .Marland Kitchen... 2.5mg  once daily except 1.25mg  on mondays and fridays  or as directed by anticoagulation clinic    Bystolic 10 Mg Tabs (Nebivolol hcl) .Marland Kitchen... Take 1 tab daily    Aspir-low 81 Mg Tbec (Aspirin) .Marland Kitchen... Take 1 tab daily  Problem # 3:  HYPERTENSION, BENIGN (ICD-401.1) Assessment: Unchanged Check BMET Her updated medication list for this problem includes:    Diltiazem Hcl Cr 240 Mg Xr24h-cap (Diltiazem hcl) .Marland Kitchen... Take 1 tab by mouth daily    Furosemide 80 Mg Tabs (Furosemide) .Marland Kitchen... Take 1 tablet by mouth once daily    Bystolic 10 Mg Tabs (Nebivolol hcl) .Marland Kitchen... Take 1 tab daily    Aspir-low 81 Mg Tbec (Aspirin) .Marland Kitchen... Take 1 tab daily    Losartan Potassium 50 Mg Tabs (Losartan potassium) .Marland Kitchen... Take 1 tablet daily    Clonidine Hcl 0.1 Mg Tabs (Clonidine hcl) .Marland Kitchen... Take 1/2 tab two times a day  Future Orders: T-Comprehensive Metabolic Panel ([id number]) ... 08/06/2010 T-CBC w/Diff ([id number]) ... 08/06/2010  Problem # 4:  HYPERLIPIDEMIA (ICD-272.4) Checking lipids and LFTS.  All results will go to Dr. Margo Aye Her updated  medication list for this problem includes:    Crestor 10 Mg Tabs (Rosuvastatin calcium) .Marland Kitchen... Take 1 tablet by mouth at bedtime  Future Orders: T-Lipid Profile ([id number]) ... 08/06/2010  Other Orders: Hemoccult Cards (Take Home) (Hemoccult Cards) Future Orders: T-Hgb A1C ([id number]) ... 08/06/2010  Patient Instructions: 1)  Your physician recommends that you schedule a follow-up appointment in: 6 months 2)  Your physician recommends that you return for lab work in: Monday, 08/06/10 3)  Your physician has recommended you make the following change in your medication: Added Flexerill 5mg  as needed for muscle spasms 4)  Your physician has asked that you test your stool for blood. It is necessary to test 3 different stool specimens for accuracy. You will be given 3 hemoccult cards for specimen collection. For each stool specimen, place a small portion of stool sample (from 2 different areas of the stool) into the 2 squares on the  card. Close card. Repeat with 2 more stool specimens. Bring the cards back to the office for testing. Prescriptions: FLEXERIL 5 MG TABS (CYCLOBENZAPRINE HCL) Take 1 tablet by mouth two times a day as needed, muscle spasms  #15 x 0   Entered by:   Fuller Plan CMA   Authorized by:   Joni Reining, NP   Signed by:   Fuller Plan CMA on 07/30/2010   Method used:   Electronically to        [facility] (retail)       [address]       [place], [place]  [address]       Ph: [phone]       Fax: [phone]   RxID:   [id number]

## 2010-09-13 NOTE — Medication Information (Signed)
Summary: ccr-lr  Anticoagulant Therapy  Managed by: Vashti Hey, RN PCP: Dr. [name] Supervising MD: Dietrich Pates MD, Molly Maduro Indication 1: Atrial Fibrillation (ICD-427.31) Lab Used: [facility] [place] Site: [place] INR POC 2.5  Dietary changes: no    Health status changes: no    Bleeding/hemorrhagic complications: no    Recent/future hospitalizations: no    Any changes in medication regimen? no    Recent/future dental: no  Any missed doses?: no       Is patient compliant with meds? yes       Allergies: No Known Drug Allergies  Anticoagulation Management History:      The patient is taking warfarin and comes in today for a routine follow up visit.  Positive risk factors for bleeding include an age of 75 years or older and presence of serious comorbidities.  The bleeding index is 'intermediate risk'.  Positive CHADS2 values include History of CHF, History of HTN, Age > 79 years old, and History of Diabetes.  The start date was 04/16/2005.  Anticoagulation responsible provider: Dietrich Pates MD, Molly Maduro.  INR POC: 2.5.  Cuvette Lot#: [id number].  Exp: 10/11.    Anticoagulation Management Assessment/Plan:      The patient's current anticoagulation dose is Warfarin sodium 2.5 mg tabs: 2.5mg  once daily except 1.25mg  on Mondays and Fridays or as directed by anticoagulation clinic.  The target INR is 2 - 3.  The next INR is due 08/27/2010.  Anticoagulation instructions were given to patient.  Results were reviewed/authorized by Vashti Hey, RN.  She was notified by Vashti Hey RN.         Prior Anticoagulation Instructions: INR 3.4 Decrease coumadin to 2.5mg  once daily except 1.25mg  on Mondays and Fridays  Current Anticoagulation Instructions: INR 2.5 Continue coumadin 2.5mg  once daily except 1.25mg  on Mondays and Fridays

## 2010-09-13 NOTE — Miscellaneous (Signed)
Summary: hemoccult cards 08/08/2010  Clinical Lists Changes  Observations: Added new observation of HEMOCCULT 3: neg (08/08/2010 16:10) Added new observation of HEMOCCULT 2: neg (08/08/2010 16:10) Added new observation of HEMOCCULT 1: neg (08/08/2010 16:10)

## 2010-09-24 ENCOUNTER — Encounter (INDEPENDENT_AMBULATORY_CARE_PROVIDER_SITE_OTHER): Payer: Medicare Other

## 2010-09-24 ENCOUNTER — Encounter: Payer: Self-pay | Admitting: Cardiology

## 2010-09-24 DIAGNOSIS — I4891 Unspecified atrial fibrillation: Secondary | ICD-10-CM

## 2010-09-24 DIAGNOSIS — Z7901 Long term (current) use of anticoagulants: Secondary | ICD-10-CM

## 2010-10-03 NOTE — Medication Information (Signed)
Summary: ccr-lr [place]  Anticoagulant Therapy  Managed by: Vashti Hey, RN PCP: Dr. [name] Supervising MD: Dietrich Pates MD, Molly Maduro Indication 1: Atrial Fibrillation (ICD-427.31) Lab Used: [facility] [place] Site: [place] INR POC 3.3  Dietary changes: no    Health status changes: no    Bleeding/hemorrhagic complications: no    Recent/future hospitalizations: no    Any changes in medication regimen? no    Recent/future dental: no  Any missed doses?: no       Is patient compliant with meds? yes       Allergies: No Known Drug Allergies  Anticoagulation Management History:      The patient is taking warfarin and comes in today for a routine follow up visit.  Positive risk factors for bleeding include an age of 75 years or older and presence of serious comorbidities.  The bleeding index is 'intermediate risk'.  Positive CHADS2 values include History of CHF, History of HTN, Age > 32 years old, and History of Diabetes.  The start date was 04/16/2005.  Anticoagulation responsible provider: Dietrich Pates MD, Molly Maduro.  INR POC: 3.3.  Cuvette Lot#: [id number].  Exp: 10/11.    Anticoagulation Management Assessment/Plan:      The patient's current anticoagulation dose is Warfarin sodium 2.5 mg tabs: 2.5mg  once daily except 1.25mg  on Mondays and Fridays or as directed by anticoagulation clinic.  The target INR is 2 - 3.  The next INR is due 10/22/2010.  Anticoagulation instructions were given to patient.  Results were reviewed/authorized by Vashti Hey, RN.  She was notified by Vashti Hey RN.         Prior Anticoagulation Instructions: INR 3.3 Hold coumadin tonight then resume 2.5mg  once daily except 1.25mg  on Mondays and Fridays  Current Anticoagulation Instructions: INR 3.3 Decrease coumadin to 2.5mg  once daily except 1.25mg  on Mondays, Wednesdays and Fridays

## 2010-10-18 ENCOUNTER — Emergency Department (HOSPITAL_COMMUNITY): Payer: Medicare Other

## 2010-10-18 ENCOUNTER — Inpatient Hospital Stay (HOSPITAL_COMMUNITY)
Admission: RE | Admit: 2010-10-18 | Discharge: 2010-10-22 | DRG: 194 | Disposition: A | Discharge status: Skilled Nursing Facility | Payer: Medicare Other | Source: Ambulatory Visit | Attending: Internal Medicine | Admitting: Internal Medicine

## 2010-10-18 ENCOUNTER — Encounter (HOSPITAL_COMMUNITY): Payer: Self-pay | Admitting: Radiology

## 2010-10-18 DIAGNOSIS — I5032 Chronic diastolic (congestive) heart failure: Secondary | ICD-10-CM | POA: Diagnosis present

## 2010-10-18 DIAGNOSIS — I509 Heart failure, unspecified: Secondary | ICD-10-CM | POA: Diagnosis present

## 2010-10-18 DIAGNOSIS — I2510 Atherosclerotic heart disease of native coronary artery without angina pectoris: Secondary | ICD-10-CM | POA: Diagnosis present

## 2010-10-18 DIAGNOSIS — J9801 Acute bronchospasm: Secondary | ICD-10-CM | POA: Diagnosis present

## 2010-10-18 DIAGNOSIS — E119 Type 2 diabetes mellitus without complications: Secondary | ICD-10-CM | POA: Diagnosis present

## 2010-10-18 DIAGNOSIS — I10 Essential (primary) hypertension: Secondary | ICD-10-CM | POA: Diagnosis present

## 2010-10-18 DIAGNOSIS — S1093XA Contusion of unspecified part of neck, initial encounter: Secondary | ICD-10-CM | POA: Diagnosis present

## 2010-10-18 DIAGNOSIS — W19XXXA Unspecified fall, initial encounter: Secondary | ICD-10-CM | POA: Diagnosis present

## 2010-10-18 DIAGNOSIS — S0003XA Contusion of scalp, initial encounter: Secondary | ICD-10-CM | POA: Diagnosis present

## 2010-10-18 DIAGNOSIS — R5381 Other malaise: Secondary | ICD-10-CM | POA: Diagnosis present

## 2010-10-18 DIAGNOSIS — J189 Pneumonia, unspecified organism: Principal | ICD-10-CM | POA: Diagnosis present

## 2010-10-18 DIAGNOSIS — I4891 Unspecified atrial fibrillation: Secondary | ICD-10-CM | POA: Diagnosis present

## 2010-10-18 DIAGNOSIS — E039 Hypothyroidism, unspecified: Secondary | ICD-10-CM | POA: Diagnosis present

## 2010-10-18 HISTORY — DX: Essential (primary) hypertension: I10

## 2010-10-18 LAB — BASIC METABOLIC PANEL
CO2: 23 mEq/L (ref 19–32)
Calcium: 8 mg/dL — ABNORMAL LOW (ref 8.4–10.5)
Chloride: 106 mEq/L (ref 96–112)
Creatinine, Ser: 1.01 mg/dL (ref 0.4–1.2)
Glucose, Bld: 66 mg/dL — ABNORMAL LOW (ref 70–99)
Sodium: 135 mEq/L (ref 135–145)

## 2010-10-18 LAB — CBC
HCT: 35 % — ABNORMAL LOW (ref 36.0–46.0)
Hemoglobin: 11.4 g/dL — ABNORMAL LOW (ref 12.0–15.0)
MCH: 25.3 pg — ABNORMAL LOW (ref 26.0–34.0)
MCHC: 32.6 g/dL (ref 30.0–36.0)
RBC: 4.51 MIL/uL (ref 3.87–5.11)

## 2010-10-18 LAB — URINALYSIS, ROUTINE W REFLEX MICROSCOPIC
Glucose, UA: NEGATIVE mg/dL
Leukocytes, UA: NEGATIVE
pH: 5.5 (ref 5.0–8.0)

## 2010-10-18 LAB — PROTIME-INR
INR: 2.43 — ABNORMAL HIGH (ref 0.00–1.49)
Prothrombin Time: 26.5 seconds — ABNORMAL HIGH (ref 11.6–15.2)

## 2010-10-18 LAB — URINE MICROSCOPIC-ADD ON

## 2010-10-19 LAB — GLUCOSE, CAPILLARY: Glucose-Capillary: 123 mg/dL — ABNORMAL HIGH (ref 70–99)

## 2010-10-19 LAB — LIPID PANEL
Cholesterol: 120 mg/dL (ref 0–200)
HDL: 39 mg/dL — ABNORMAL LOW (ref >39)
LDL Cholesterol: 66 mg/dL (ref 0–99)
Total CHOL/HDL Ratio: 3.1 RATIO
Triglycerides: 74 mg/dL (ref <150)

## 2010-10-19 LAB — CK TOTAL AND CKMB (NOT AT ARMC): Relative Index: 3.4 — ABNORMAL HIGH (ref 0.0–2.5)

## 2010-10-19 LAB — CARDIAC PANEL(CRET KIN+CKTOT+MB+TROPI)
CK, MB: 4.5 ng/mL — ABNORMAL HIGH (ref 0.3–4.0)
Relative Index: 3.4 — ABNORMAL HIGH (ref 0.0–2.5)
Total CK: 134 U/L (ref 7–177)
Total CK: 125 U/L (ref 7–177)
Troponin I: <0.01 ng/mL (ref 0.00–0.06)
Troponin I: 0.01 ng/mL (ref 0.00–0.06)

## 2010-10-19 LAB — HEMOGLOBIN A1C: Mean Plasma Glucose: 126 mg/dL — ABNORMAL HIGH (ref <117)

## 2010-10-19 LAB — BRAIN NATRIURETIC PEPTIDE: Pro B Natriuretic peptide (BNP): 482 pg/mL — ABNORMAL HIGH (ref 0.0–100.0)

## 2010-10-20 LAB — BASIC METABOLIC PANEL
CO2: 23 mEq/L (ref 19–32)
Chloride: 111 mEq/L (ref 96–112)
Creatinine, Ser: 0.81 mg/dL (ref 0.4–1.2)
GFR calc Af Amer: >60 mL/min (ref >60)
Potassium: 4.1 mEq/L (ref 3.5–5.1)
Sodium: 140 mEq/L (ref 135–145)

## 2010-10-20 LAB — PROTIME-INR
INR: 2.09 — ABNORMAL HIGH (ref 0.00–1.49)
Prothrombin Time: 23.6 seconds — ABNORMAL HIGH (ref 11.6–15.2)

## 2010-10-20 LAB — GLUCOSE, CAPILLARY: Glucose-Capillary: 112 mg/dL — ABNORMAL HIGH (ref 70–99)

## 2010-10-21 LAB — PROTIME-INR
INR: 2.24 — ABNORMAL HIGH (ref 0.00–1.49)
Prothrombin Time: 24.9 seconds — ABNORMAL HIGH (ref 11.6–15.2)

## 2010-10-21 LAB — GLUCOSE, CAPILLARY: Glucose-Capillary: 134 mg/dL — ABNORMAL HIGH (ref 70–99)

## 2010-10-22 LAB — PROTIME-INR: INR: 2.51 — ABNORMAL HIGH (ref 0.00–1.49)

## 2010-10-22 LAB — GLUCOSE, CAPILLARY: Glucose-Capillary: 131 mg/dL — ABNORMAL HIGH (ref 70–99)

## 2010-10-28 LAB — DIFFERENTIAL
Basophils Absolute: 0 10*3/uL (ref 0.0–0.1)
Basophils Relative: 0 % (ref 0–1)
Eosinophils Absolute: 0.1 10*3/uL (ref 0.0–0.7)
Monocytes Relative: 7 % (ref 3–12)
Neutrophils Relative %: 73 % (ref 43–77)

## 2010-10-28 LAB — COMPREHENSIVE METABOLIC PANEL
ALT: 15 U/L (ref 0–35)
Alkaline Phosphatase: 60 U/L (ref 39–117)
CO2: 24 mEq/L (ref 19–32)
Chloride: 104 mEq/L (ref 96–112)
GFR calc non Af Amer: 59 mL/min — ABNORMAL LOW (ref >60)
Glucose, Bld: 170 mg/dL — ABNORMAL HIGH (ref 70–99)
Potassium: 4.4 mEq/L (ref 3.5–5.1)
Sodium: 137 mEq/L (ref 135–145)
Total Bilirubin: 0.7 mg/dL (ref 0.3–1.2)
Total Protein: 6.5 g/dL (ref 6.0–8.3)

## 2010-10-28 LAB — CBC
HCT: 36.1 % (ref 36.0–46.0)
Hemoglobin: 11.6 g/dL — ABNORMAL LOW (ref 12.0–15.0)
RBC: 4.5 MIL/uL (ref 3.87–5.11)
WBC: 6.5 10*3/uL (ref 4.0–10.5)

## 2010-11-03 NOTE — H&P (Signed)
[name], [name]                  ACCOUNT NO.:  [id number]  MEDICAL RECORD NO.:  [id number]           PATIENT TYPE:  E  LOCATION:  APED                          FACILITY:  [facility]  PHYSICIAN:  [name] Siren, MD           DATE OF BIRTH:  08/23/1920  DATE OF ADMISSION:  10/18/2010 DATE OF DISCHARGE:  LH                             HISTORY & PHYSICAL   PRIMARY CARE PHYSICIAN:  [name] Pizza, MD  ADVANCE DIRECTIVE:  Full code.  REASON FOR ADMISSION:  Fall and upper respiratory infection.  HISTORY OF PRESENT ILLNESS:  This is an 77 year old female who lives alone with past medical history significant for atrial fibrillation on Coumadin with therapeutic INR, diabetes, hypertension, coronary artery disease, hypothyroidism, history of breast cancer, peripheral vascular disease who presents to the emergency room with [name] fall and hit her head. She has been coughing for 1 week and was treated with Levaquin and Tessalon by her primary care physician, Dr. Catalina [name].  She did not have any chest pain or complain of any shortness of breath, but has been feeling very weak.  She did not have any loss of consciousness.  She denies any abdominal cramps or pain, black stool, or bloody stool, fever or chills.  Workup in the emergency room included [name] head CT, which showed [name] scalp hematoma at the left vertex.  Her neck CT and spine CT were negative.  She has [name] normal white count of 6500, hemoglobin of 11.4.  She has normal electrolytes and kidney function.  Her blood glucose is 66.  Because she was weak and live alone and due to her advanced age, Hospitalist was asked to admit her.  PAST MEDICAL HISTORY: 1. Atrial fibrillation. 2. Hypertension. 3. Diabetes. 4. Hypercholesterolemia. 5. Congestive heart failure. 6. Chronic lower extremity edema. 7. Hypothyroidism. 8. Chronic anticoagulation with Coumadin. 9. Mitral regurgitation. 10.History of breast cancer.  SOCIAL HISTORY:  She is [name] retired Comptroller.   She lives in [place]. Does not smoke or drink.  FAMILY HISTORY:  Noncontributory.  REVIEW OF SYSTEMS:  Otherwise unremarkable.  PHYSICAL EXAMINATION:  GENERAL:  Elderly female in no apparent distress. She is alert and oriented and conversing. VITAL SIGNS:  Blood pressure 120/70, pulse of 76, respiratory rate of 18, temperature 97.7. HEENT:  She has fluent speech and tongue is midline.  Uvula elevated with phonation.  She does have [name] scalp hematoma in the back of her head. Throat is clear.  No stridor. CARDIOVASCULAR:  S1 and S2 irregular with [name] 2/6 systolic ejection murmur at the left sternal border. LUNGS:  Diffuse wheezing, but no crackles or any evidence of consolidation. ABDOMEN:  Soft, nondistended, nontender. EXTREMITIES:  1+ edema.  No calf tenderness.  Good distal pulses and no acrocyanosis. SKIN:  Warm and dry. NEUROLOGICAL:  Otherwise unremarkable. PSYCHIATRIC:  Unremarkable as well.  OBJECTIVE FINDINGS:  Head and spine CT, no fracture.  D-dimer is 17. Urinalysis is negative.  INR of 2.43.  Chest x-ray showed no infiltrate, but with cardiomegaly and mild interstitial edema.  There is more focal opacity on  the left, which could represent pneumonia, however.  White count of 6500, hemoglobin of 11.4, creatinine of 1.01, glucose of 66.  I did not see an EKG.  IMPRESSION:  This is an 77 year old with multiple medical problems including atrial fibrillation on anticoagulation, diabetes, coronary artery disease, hypertension, breast cancer, presents with weakness and [name] fall.  She did not lose consciousness.  She likely had upper respiratory infection with no pneumonia.  She does have wheezes and diabetes.  We will admit her to telemetry floor.  I will rule out with serial CPKs and troponins.  We will get an EKG just to be complete.  She does have wheezing and need nebulizer treatments, but we will hold off on steroids as she is diabetic.  Her D-dimer is elevated, but I  do not think that there is any evidence of PE, so we will ignore this nonspecific laboratory result.  She is stable, and we will admit her to telemetry.  She is [name] full code and will be under the care of [facility].  I will continue the rest of medications.  We will check [name] TSH also.     [name] Siren, MD     PL/MEDQ  D:  10/19/2010  T:  10/19/2010  Job:  [id number]  Electronically Signed by [name]  on 11/03/2010 04:48:30 AM

## 2010-11-22 ENCOUNTER — Encounter: Payer: Self-pay | Admitting: Cardiology

## 2010-11-22 DIAGNOSIS — I639 Cerebral infarction, unspecified: Secondary | ICD-10-CM

## 2010-11-22 DIAGNOSIS — Z7901 Long term (current) use of anticoagulants: Secondary | ICD-10-CM | POA: Insufficient documentation

## 2010-11-22 DIAGNOSIS — I4891 Unspecified atrial fibrillation: Secondary | ICD-10-CM

## 2010-11-22 NOTE — Discharge Summary (Signed)
NAME:  [name], [name]                  ACCOUNT NO.:  [id number]  MEDICAL RECORD NO.:  [id number]           PATIENT TYPE:  I  LOCATION:  A312                          FACILITY:  [facility]  PHYSICIAN:  Jamis Kryder L. [name] [name], MDDATE OF BIRTH:  1920/11/18  DATE OF ADMISSION:  10/18/2010 DATE OF DISCHARGE:  03/12/2012LH                              DISCHARGE SUMMARY   DISCHARGE DIAGNOSES: 1. Community-acquired pneumonia. 2. Bronchospasm. 3. Fall with scalp hematoma. 4. Weakness and deconditioning secondary to above. 5. Atrial fibrillation. 6. Diabetes. 7. Hypertension. 8. Coronary artery disease. 9. Hypothyroidism. 10.History of breast cancer. 11.Peripheral vascular disease. 12.Mitral regurgitation. 13.Diastolic dysfunction, no evidence of congestive heart failure     during this hospitalization.  DISCHARGE MEDICATIONS: 1. Tylenol 650 mg p.o. q.4 h. p.r.n. pain or fever. 2. Cardizem CD has been increased to 360 mg p.o. daily, hold for pulse     less than 50 or systolic blood pressure less than 100. 3. She had been started on digoxin 0.25 mg p.o. daily, hold for pulse     less than 60. 4. Guaifenesin DM 5-10 mL p.o. q.4 h. p.r.n. cough. 5. Xopenex 0.63 mg four times daily for a week nebulized, then q.4 h.     p.r.n. wheezing or shortness of breath. 6. Moxifloxacin 400 mg daily until October 26, 2010, then stop. 7. Tessalon Perles 100 mg p.o. t.i.d. p.r.n. cough. 8. Continue Allegra 100 mg a day. 9. Aspirin 81 mg a day. 10.Bystolic 10 mg a day. 11.Calcium with vitamin D daily. 12.Cod liver oil 1 teaspoon daily. 13.Crestor 10 mg a day. 14.Evista 60 mg a day. 15.Glipizide 5 mg a day. 16.Lutein 1 tablet a day. 17.Metformin 500 mg twice a day. 18.Omeprazole 20 mg twice a day. 19.KCl 20 mEq a day. 20.Synthroid 25 mcg a day. 21.Warfarin 2.5 mg one half tablet Monday, Wednesday, Friday, hold     tablet on Tuesdays, Thursdays, Saturday, Sunday.  Please adjust to     keep INR between 2 and  3.  CONDITION:  Stable.  ACTIVITY:  Fall precautions.  Continue physical therapy, occupational therapy.  Follow up with Dr. Margo Aye in 2-4 weeks.  She will need an INR check within a few days and then per nursing home protocol while on Coumadin.  She will also need to see nursing home physician within a few days.  CONSULTATIONS:  None.  PROCEDURES:  None.  DIET:  Should be heart-healthy diabetic.  LABORATORY DATA:  Blood cultures negative.  INR on admission 2.43, D- dimer 16.  CBC normal basic metabolic panel significant for a glucose of 66, BUN 24, otherwise unremarkable.  Hemoglobin A1c was 6.0.  BNP 482. Serial cardiac enzymes negative.  LDL 66, HDL 39, triglycerides 74, total cholesterol 120, TSH 0.616.  Urinalysis showed trace blood, trace protein, otherwise negative.  DIAGNOSTICS:  Chest x-ray showed cardiomegaly with diffuse interstitial prominence likely mild interstitial edema, somewhat more focal opacity on the left could represent asymmetric edema or pneumonia.  CT brain without contrast showed no fracture, no stroke.  CT of the C-spine showed normal alignment and no acute bony changes.  EKG showed atrial fibrillation with PVC, nonspecific changes.  HISTORY AND HOSPITAL COURSE:  Please see H and P for details.  [name] is a pleasant 77 year old white female who had been started on Levaquin several days prior to admission for acute bronchitis.  She had been coughing, weak, short of breath.  She is not a smoker and she has no history of asthma or COPD.  She had fallen and sustained a scalp hematoma.  In the emergency room, she had normal vital signs.  She had irregular heart rhythm with a systolic murmur.  She had a hematoma in the occipital area.  She had diffuse wheezing and 1+ edema.  Her chest x- ray shows possible pulmonary edema, but clinically more consistent with pneumonia.  She appears compensated with respect to heart failure at this time.  She reportedly has  a history of diastolic dysfunction.  She was started on Avelox, bronchodilators, oxygen.  She also received a few doses of prednisone for her bronchospasm.  With the nebulizers, she did have some mild tachycardia and remained in atrial fibrillation.  Her clonidine and ARB were stopped so that her Cardizem could be increased. She also was started on digoxin.  She really never got much higher than 120 or so, but currently ranging about 70-100 today.  Once her pulmonary issues are stabilized and she is off bronchodilators, I suspect her rate will continue to improve.  She had no chest pain.  At the time of discharge, she had clear lung sounds.  Her cough was improved and she was feeling a bit stronger.  She worked with physical therapy and will benefit from short-term skilled nursing facility.  Her other medical problems remained stable.  Total time on the day of discharge greater than 30 minutes.     [name] Caprice, MD     CLS/MEDQ  D:  10/22/2010  T:  10/22/2010  Job:  [id number]  cc:   [name], M.D. Fax: [phone]  Electronically Signed by Crista Curb MD on 11/22/2010 11:09:59 AM

## 2010-11-26 LAB — COMPREHENSIVE METABOLIC PANEL
ALT: 12 U/L (ref 0–35)
BUN: 24 mg/dL — ABNORMAL HIGH (ref 6–23)
Calcium: 8.4 mg/dL (ref 8.4–10.5)
Glucose, Bld: 140 mg/dL — ABNORMAL HIGH (ref 70–99)
Sodium: 140 mEq/L (ref 135–145)
Total Protein: 6 g/dL (ref 6.0–8.3)

## 2010-12-10 ENCOUNTER — Telehealth: Payer: Self-pay | Admitting: Cardiology

## 2010-12-10 ENCOUNTER — Ambulatory Visit (INDEPENDENT_AMBULATORY_CARE_PROVIDER_SITE_OTHER): Payer: Medicare Other | Admitting: *Deleted

## 2010-12-10 DIAGNOSIS — I6350 Cerebral infarction due to unspecified occlusion or stenosis of unspecified cerebral artery: Secondary | ICD-10-CM

## 2010-12-10 DIAGNOSIS — I4891 Unspecified atrial fibrillation: Secondary | ICD-10-CM

## 2010-12-10 DIAGNOSIS — Z7901 Long term (current) use of anticoagulants: Secondary | ICD-10-CM

## 2010-12-10 DIAGNOSIS — I639 Cerebral infarction, unspecified: Secondary | ICD-10-CM

## 2010-12-10 LAB — POCT INR: INR: 3.5

## 2010-12-10 NOTE — Telephone Encounter (Signed)
Patient called back to give you the information on her coumadin / pls return her call in the AM / tg

## 2010-12-11 NOTE — Telephone Encounter (Signed)
Pt called back stating she has coumadin 2mg  and 3mg  tablets.  She held coumadin last night as instructed and will take 2mg  daily till I see her next week.

## 2010-12-20 ENCOUNTER — Ambulatory Visit (INDEPENDENT_AMBULATORY_CARE_PROVIDER_SITE_OTHER): Payer: Medicare Other | Admitting: *Deleted

## 2010-12-20 DIAGNOSIS — I4891 Unspecified atrial fibrillation: Secondary | ICD-10-CM

## 2010-12-20 DIAGNOSIS — Z7901 Long term (current) use of anticoagulants: Secondary | ICD-10-CM

## 2010-12-20 DIAGNOSIS — I6350 Cerebral infarction due to unspecified occlusion or stenosis of unspecified cerebral artery: Secondary | ICD-10-CM

## 2010-12-20 DIAGNOSIS — I639 Cerebral infarction, unspecified: Secondary | ICD-10-CM

## 2010-12-20 LAB — POCT INR: INR: 2.4

## 2010-12-25 NOTE — Letter (Signed)
August 04, 2007    [name], M.D.  [address]  [place],  [place] [address]   RE:  [name], [name]  MRN:  [id number]  /  DOB:  07/30/1921   Dear Ian Malkin:   Ms. Shupert returns to the office for continued followup of congestive  heart failure with preserved left ventricular systolic function.  Since  her last visit, she is about stable.  She reports exertional fatigue,  but not much in the way of dyspnea.  She has ankle edema that is worse  at times.  She has no orthopnea.  No PND.  She has had no chest  discomfort.  Medications are unchanged from her last visit, except for  the increase in dose of Bystolic to 10 mg daily.   EXAM:  Pleasant woman with flat affect and in no acute distress.  The weight is 142, 3 pounds less than at her last visit.  Blood pressure  130/75, heart rate 84 and irregular.  NECK:  Moderate jugular venous distension.  LUNGS:  Moderate kyphosis; clear lung fields.  CARDIAC:  Irregular rhythm; normal first and second heart sounds; no  third heart sound appreciated.  ABDOMEN:  Soft and nontender.  No organomegaly.  EXTREMITIES:  Increased soft tissue with 1+ pitting edema bilaterally.   RHYTHM STRIP:  Atrial fibrillation with a controlled ventricular  response.   IMPRESSION:  [name] is doing fairly well.  She will continue her  current medications and come to see me again in 1 month.  A chemistry  profile and BNP level will be performed.  I suspect we will ultimately  need to add a second diuretic to her medical regimen.    Sincerely,      Gerrit Friends. Dietrich Pates, MD, [facility]  Electronically Signed    RMR/MedQ  DD: 08/04/2007  DT: 08/04/2007  Job #: [id number]

## 2010-12-25 NOTE — Letter (Signed)
July 20, 2008    [name] Pizza, MD  [address] [place],  [place] [address]   RE:  [name], [name]  MRN:  [id number]  /  DOB:  [birth year]   Dear [name],   [name] presented today for evaluation of lower extremity peripheral  arterial disease.  As you know, she is an 77 year old woman with a long  history of leg pain.  She ambulates with a walker and has sustained a  previous fall with left hip fracture.  Her main complaint is bilateral  foot pain with standing or walking.  She also has pain in the lower legs  that can occur at rest or with walking.  She has chronic edema of both  lower legs as well.  She underwent ABIs with segmental pressures  demonstrating ABIs of 64% on the right and 59% on the left.  Her pulse  volume recordings suggested distal disease bilaterally.  Of note, she  does not have calf pain with ambulation.  She has had no foot  ulcerations.  She has had a left leg venous stasis ulceration in the  past.   MEDICATIONS:  1. Synthroid 50 mcg one-half daily.  2. Cardia 240 mg daily.  3. Clonidine 0.1 mg b.i.d.  4. Evista 60 mg daily.  5. Potassium 20 mEq daily.  6. Warfarin as directed.  7. Tekturna 150 mg daily.  8. Lasix 80 mg daily.  9. Crestor 10 mg daily.  10.Bystolic 10 mg daily.  11.Metformin 500 mg 1 in the morning, 2 in the evening.  12.Prilosec 20 mg twice daily.  13.Glipizide 5 mg daily.  14.Aspirin 81 mg daily.  15.Cod liver oil 1 daily.  16.Caltrate plus D 600 mg b.i.d.   ALLERGIES:  NKDA.   PAST MEDICAL HISTORY:  Pertinent for atrial fibrillation, congestive  heart failure, chronic lower extremity edema, type 2 diabetes,  hypothyroidism, hypertension, chronic anticoagulation with Coumadin,  hyperlipidemia, mitral regurgitation, and history of breast cancer.   SOCIAL HISTORY:  The patient is a retired Comptroller.  She lives in  [place].  She does not smoke cigarettes or drink alcohol.   FAMILY HISTORY:  Noncontributory.   REVIEW OF  SYSTEMS:  A 12-point review of systems was performed.  There  are no other pertinent positives to report.  The patient does have mild  exertional dyspnea that is stable over time.   PHYSICAL EXAMINATION:  GENERAL:  This is an elderly woman in no acute  distress.  VITAL SIGNS:  Weight is 148 pounds, blood pressure 124/70, heart rate  64, respiratory rate 16.  HEENT:  Normal.  NECK:  Normal carotid upstrokes.  No bruits.  JVP normal.  No  thyromegaly or thyroid nodules.  LUNGS:  Clear bilaterally.  There is marked kyphosis.  CARDIAC:  There is no right ventricular heave or lift.  HEART:  Regular rate and rhythm.  There is a 2-3/6 holosystolic murmur  at the left lower sternal border.  ABDOMEN:  Soft, nontender, no organomegaly.  No abdominal bruits.  EXTREMITIES:  Femoral pulses are 2+, dorsalis pedis pulses are 2+,  posterior tibial pulses are 1+ and equal.  There is 2+ pretibial edema  bilaterally down to the ankle.  There are marked varicosities in the  feet.  There are some chronic stasis changes.  The left lateral  pretibial region has a superficial venous stasis ulceration with no  drainage.  SKIN:  Otherwise, warm and dry with no rashes.  There are no  ischemic  ulcerations.   ASSESSMENT:  [name] is an 76 year old woman with chronic leg pain.  Her leg pain is atypical for arterial insufficiency.  I suspect her  problems are multifactorial.  She appears to have reasonably good  arterial perfusion at rest with intact pedal pulses.  Her ABIs are in  the moderate range bilaterally, and I think, she can be managed with  continued observation and medical therapy.  She is on multiple medicines  and I do not think she would gain much benefit from the addition of  cilostazol.  She is already on Coumadin and low-dose aspirin, and I  think, her additional bleeding risk would outlay potential benefit.  She  clearly has venous stasis and I discussed continued use of compression  hose and  leg elevation.   Thanks again for asking me to see [name].  I will schedule her back  on a p.r.n. basis, but would be more than happy to see her if vascular  problems arise in the future.  Please feel free to call at any time with  questions.    Sincerely,      [name]. [name] Seltzer, MD  Electronically Signed    MDC/MedQ  DD: 07/20/2008  DT: 07/21/2008  Job #: [id number]   CC:    [name]. [name] Pates, MD, [name]

## 2010-12-25 NOTE — Letter (Signed)
September 26, 2008    [name], MD  [address] [place], [place] [address]   RE:  KENAE, LINDQUIST  MRN:  [id number]  /  DOB:  08-04-1921   Dear Ian Malkin,   Ms. Colvard returns to the office as scheduled for continued assessment  and treatment of congestive heart failure with preserved left  ventricular systolic function, atrial fibrillation, and widespread  vascular disease.  Since her last visit, she has done quite well.  She  was evaluated by Dr. Excell Seltzer, who recommended continuing medical therapy  for moderate peripheral vascular disease.  She is no longer complaining  of leg discomfort.  She has not had chest discomfort, dyspnea,  lightheadedness, or syncope.  She has developed a rash over the past  week.  She was unable to schedule an appointment with a dermatologist,  so she asked the dentist to recommend treatment.  She has been taking  Benadryl.  She thinks there has been some improvement in the appearance  of the rash as well as a decrease in pruritus.   Current medications are complex and extensive, but unchanged from her  last visit.   PHYSICAL EXAMINATION:  GENERAL:  Pleasant woman in no acute distress.  VITAL SIGNS:  The weight is 147, 1 pound less than in December.  Blood  pressure 120/70, heart rate 80 and irregular, and respirations 13 and  unlabored.  NECK:  No jugular venous distention.  LUNGS:  Mild-to-moderate kyphosis; clear lung fields.  CARDIAC:  Normal first and second heart sounds; irregular rhythm; grade  2/6 holosystolic apical murmur, either also heard over the base or there  may be a second systolic murmur at the base.  ABDOMEN:  Soft and nontender; protuberant; no bruits.  EXTREMITIES:  1/2+ ankle edema.  SKIN:  Macular rash over the dorsal surface of both arms with punctate  erythematous lesions.   IMPRESSION:  Ms. Rosemeyer appears to be doing well from a cardiovascular  standpoint.  Congestive heart failure is compensated.  She had a  chemistry profile last  month that was quite good.  Glucose was only 140.  Renal function remains normal.  She has not had a lipid profile in  almost 2 years - one will be obtained.  Her BNP level was high at her  last visit, but substantially improved.  Her current diuretic regimen  will be continued.  Due to chronic anticoagulation, we will check CBC  and stool for hemoccult testing.  If results are good, I will plan to  see this nice woman again in 8 months.  She is advised to call your  office or her dermatologist, if her rash persists.    Sincerely,      Gerrit Friends. Dietrich Pates, MD, [facility]  Electronically Signed    RMR/MedQ  DD: 09/26/2008  DT: 09/27/2008  Job #: [id number]

## 2010-12-25 NOTE — Letter (Signed)
September 28, 2007    [name], M.D.  [address] [place],  [place] [address]   RE:  [name], [name]  MRN:  [id number]  /  DOB:  [birth year]   Dear Ian Malkin:   [name] returns to the office for continued close follow-up of  congestive heart failure and atrial fibrillation.  Since last visit, she  has done somewhat better.  She still notes exertional fatigue and  dyspnea when she walks with her walker over a short to moderate  distance, but is more functional and basically accomplishing all she  needs to at home.  She has had no orthopnea or PND.  She has mild  chronic edema.  She has not seen any physicians in recent months.   Current medications are unchanged from her last visit.   EXAM:  Pleasant woman in no acute distress.  The weight is 136, 6 pounds less than at her last visit in December  2008.  Blood pressure 135/85, heart rate 92 and irregular taken  apically.  NECK:  No jugular venous distention.  LUNGS:  Few left basilar rales that clear with cough.  CARDIAC:  Irregular rhythm; normal first and second heart sounds; grade  3/6 holosystolic decrescendo murmur at the left sternal border and apex.  ABDOMEN:  Soft and nontender; no organomegaly.  EXTREMITIES:  1+ pitting ankle edema.   Most recent laboratory is from December at which time electrolytes were  normal as was creatinine.  The BMP level was nearly 1200.   IMPRESSION:  [name] has chronic congestive heart failure related to  diastolic dysfunction and perhaps to mitral regurgitation that has been  stable to improved.  We will check a chemistry profile and chest x-ray.  If results are good, she will continue her current medications and  return to see me again in 3 months.  A chemistry profile will be checked  again in 6 weeks.   She has atrial fibrillation.  Heart rate is adequately controlled.   She is chronically anticoagulated.  Will check CBC and stool for  hemoccult testing.    Sincerely,      Gerrit Friends. Dietrich Pates, MD, [facility]  Electronically Signed    RMR/MedQ  DD: 09/28/2007  DT: 09/29/2007  Job #: [id number]

## 2010-12-25 NOTE — Letter (Signed)
Dec 16, 2006    [name], M.D.  [address]  [place],  [place] [address]   RE:  [name], [name]  MRN:  [id number]  /  DOB:  1920-11-28   Dear Ian Malkin:   [name] returns to the office following a recent hospital admission  for congestive heart failure. With diuresis, her cough and dyspnea has  improved, but she still notes some dyspnea on exertion. She is sleeping  on one pillow with no orthopnea nor PND. She has not noted significant  pedal edema. Her weight has been stable at home.   CURRENT MEDICATIONS:  1. Furosemide 80 mg q.a.m. and 40 mg q.p.m.  2. Diltiazem 30 mg t.i.d.  3. Levothyroxine 0.05 mg daily.  4. Evista 60 mg daily.  5. Metoprolol 50 mg b.i.d.  6. Kay Ciel 20 mEq b.i.d.  7. Clonidine 0.3 mg b.i.d.  8. Warfarin as directed.  9. Diovan 160 mg daily.  10.Crestor 10 mg daily.   PHYSICAL EXAMINATION:  GENERAL:  A pleasant woman in no acute distress.  VITAL SIGNS:  The weight is 138, 12 pounds less than in early April.  Blood pressure 105/70, heart rate 115 and irregular taken apically.  Respirations 22.  NECK:  No jugular venous distention.  THORAX:  Moderate kyphosis; clear lung fields.  CARDIAC:  Rapid irregular rhythm; modest systolic murmur.  ABDOMEN:  Soft and nontender; no organomegaly.  EXTREMITIES:  Trace edema.   INR:  4.1.   IMPRESSION:  Ms. [name] is improved with a higher dose of diuretic and  discontinuation of minoxidil. Her heart rate is out of control with a  reduction in her dose of diltiazem. We will increase her current short  acting dosage to 60 mg t.i.d. She probably will ultimately require  resumption of 240 mg per day. Blood pressure control is too good if  anything. It may be possible to decrease or discontinue clonidine  ultimately. [name] will return to see the cardiology nurses in 1 week  for a rhythm strip. Her dose of warfarin will be adjusted. I will see  this nice woman again in 6 weeks.    Sincerely,      Gerrit Friends.  Dietrich Pates, MD, [facility]  Electronically Signed    RMR/MedQ  DD: 12/16/2006  DT: 12/16/2006  Job #: [id number]

## 2010-12-25 NOTE — Letter (Signed)
January 27, 2007    [name], M.D.  [address]  [place],  [place] [address]   RE:  PEGI, MILAZZO  MRN:  [id number]  /  DOB:  [birth year]   Dear Ian Malkin:   Ms. Mccully returns to the office for continued assessment and treatment  of atrial fibrillation and congestive heart failure related to diastolic  dysfunction.  Since last visit, she has been fairly stable.  She notes  weakness in the morning.  She has paroxysms of cough productive of scant  mucoid sputum that occur once or twice a day and are somewhat  troublesome to her.  She has had no orthopnea nor PND.  She has not  noted edema.   MEDICATIONS:  Unchanged from her last visit except for an increase in  her dose of Diltiazem.   EXAMINATION:  Chronically ill-appearing woman in no acute distress.  The  weight is 138, 12 pounds less than in April.  Blood pressure 125/65,  heart rate 100 and irregular, respirations 14.  NECK:  No internal jugular distention; the external jugular on the left  is prominent.  LUNGS:  Clear.  Moderate kyphosis.  CARDIAC:  Irregular rhythm; normal first and second heart sounds; modest  holosystolic murmur over the left thorax.  ABDOMEN:  Soft and nontender; no organomegaly.  EXTREMITIES:  No edema.   Rhythm strip:  Atrial fibrillation with a rapid ventricular response of  nearly 100.   IMPRESSION:  Ms. Ruff is doing fairly well.  Due to weakness, we will  recheck a chemistry profile and obtain a TSH level as well.  CBC was  fine less than 2 months ago.  Her chronic  cough might best be addressed by pulmonologist.  She did not feel a  consultation with a specialist was warranted.  We will increase her  Diltiazem for better control of heart rate.  We will decrease clonidine,  as she does not appear to need so much antihypertensive medication.  I  will reassess this nice woman in 2 months.    Sincerely,      Gerrit Friends. Dietrich Pates, MD, [facility]  Electronically Signed    RMR/MedQ  DD: 01/27/2007  DT:  01/27/2007  Job #: [id number]

## 2010-12-25 NOTE — Letter (Signed)
June 17, 2007    [name], M.D.  [address]  [place],  [place] [address]   RE:  [name], [name]  MRN:  [id number]  /  DOB:  1920/11/13   Dear Ian Malkin:   It was my pleasure evaluating [name] in the office today at your  request.  As you know, she has had heart failure with preserved left  ventricular systolic function in the past.  This cleared  radiographically after a brief hospitalization.  She has had a chronic  cough since then productive of mucoid sputum.  You recently were  undertaking an evaluation of this.  Chest x-ray shows recurrent  pulmonary edema with a small-to-moderate left pleural effusion.  Despite  this, [name] does not feel substantially worse in terms of dyspnea on  exertion.  She has not noted increased pedal edema.  There has been no  change in weight, which she monitors on a daily basis.  Her medications  have not changed.   PHYSICAL EXAMINATION:  A pleasant woman in no acute distress.  The weight is 142, unchanged.  Blood pressure 125/75, heart rate 82 and  irregular, respirations 16.  NECK:  Mild-to-moderate jugular venous distension.  No carotid bruits.  LUNGS:  Moderate kyphosis.  Clear lung fields.  No dullness to  percussion appreciable at the bases.  CARDIAC:  Irregular rhythm, normal 1st and 2nd heart sounds.  Grade 2/6  holosystolic murmur at the left sternal border.  EXTREMITIES:  2+ edema on the left, 1+ on the right.   IMPRESSION:  Although there is a remote possibility that [name]'  edema is noncardiogenic, I suspect she has recurrent congestive heart  failure.  Her mitral regurgitation was last assessed as moderate, this  may be hemodynamically significant.  She is resistant to increasing  diuretics, as she is already urinating many times daily.  I explained  that this is the only feasible approach at present.  She will increase  Lasix to 80 mg b.i.d. for the time being.  A chemistry profile will be  obtained now and in 5 days when  she returns for reassessment.  A BNP  level will once again be requested.  We briefly discussed the  possibility of cardiac surgery.  At present, she is not inclined to  undergo valve replacement under any circumstances.  Thanks for being so  alert to [name]' circumstances.  She has been taking a proton pump  inhibitor for chronic cough without benefit.  She switched to  omeprazole, but probably can discontinue this entirely.    Sincerely,      [name] Friends. Dietrich Pates, MD, [facility]  Electronically Signed    RMR/MedQ  DD: 06/17/2007  DT: 06/18/2007  Job #: [id number]

## 2010-12-25 NOTE — Procedures (Signed)
[name], MERAS                  ACCOUNT NO.:  [id number]   MEDICAL RECORD NO.:  [id number]          PATIENT TYPE:  OUT   LOCATION:  RAD                           FACILITY:  [facility]   PHYSICIAN:  Edward L. Juanetta Gosling, M.D.DATE OF BIRTH:  August 26, 1920   DATE OF PROCEDURE:  DATE OF DISCHARGE:  06/11/2007                            PULMONARY FUNCTION TEST   1. Spirometry shows a moderate ventilatory defect with evidence of      airflow obstruction.  2. Lung volumes showed a normal total lung capacity.  3. DLCO is mildly reduced.  4. There is no significant bronchodilator improvement.      Edward L. Juanetta Gosling, M.D.  Electronically Signed     ELH/MEDQ  D:  06/25/2007  T:  06/25/2007  Job:  [id number]   cc:   [name], M.D.  Fax: [phone]

## 2010-12-25 NOTE — Letter (Signed)
March 30, 2007    [name], M.D.  [address]  [place],  [place] [address]   RE:  VITALIA, STOUGH  MRN:  [id number]  /  DOB:  19-Jul-1921   Dear Ian Malkin:   Ms. Scaduto returns to the office for continued assessment and treatment  of atrial fibrillation, coronary disease and additional medical issues.  Since her last visit, she has done fairly well overall.  She reports  some generalized weakness intermittently.  She continues to have a  chronic cough somewhat productive of mucoid sputum.  She particularly  notes this when she is sitting in a chair or after she retires for the  night.  She has had no dyspnea nor edema.  She has noted no  palpitations.  She has no chest discomfort.  She has experienced some  alopecia with a good deal of hair loss with brushing.   CURRENT MEDICATIONS:  Include furosemide 80 mg daily, levothyroxine  0.025 mg daily, Evista 60 mg daily, Metoprolol 50 mg b.i.d., Kay Ciel 20  mEq daily, Clonidine 0.05 mg b.i.d., warfarin as directed, rosuvastatin  10 mg daily, Glipizide 10 mg daily, Metformin a total of 1500 mg per  day, Tekturna 150 mg daily, Diltiazem 240 mg daily, plus fexofenadine.   ON EXAM:  GENERAL APPEARANCE:  A pleasant woman in no acute distress  with a somewhat flat affect.  VITAL SIGNS:  The weight is 141, 3 pounds more than in June. Blood  pressure 110/70, heart rate 80 and irregular, respirations 16.  NECK:  No jugular venous distension; normal carotid upstrokes without  bruits.  LUNGS:  Mild kyphosis; clear lung fields.  CARDIAC:  Irregular rhythm; normal first and second heart sounds; modest  systolic ejection murmur.  ABDOMEN:  Soft and nontender; normal bowel sounds; no organomegaly.  EXTREMITIES:  Trace edema.   LABORATORY IN JUNE:  Was excellent with a normal chemistry profile and a  good lipid profile.  BNP was ordered but not obtained.  Her last CBC was  in April and was normal.   IMPRESSION:  Ms. Battenfield continues to have a persistent  cough. She  continues to be resistant towards referral to a pulmonary specialist.  I  suggested that we treat her empirically with a PPI for 1 month. She will  take Aciphex 20 mg b.i.d.  She is scheduled to follow up with you in the  interim at which point you can determine whether this is effective and  whether additional testing or treatment is required.  I will plan to see  this nice woman again in 6 months.  We will check stool for Hemoccult  testing since she is receiving anticoagulants.    Sincerely,      Gerrit Friends. Dietrich Pates, MD, [facility]  Electronically Signed    RMR/MedQ  DD: 03/30/2007  DT: 03/30/2007  Job #: [id number]

## 2010-12-25 NOTE — Letter (Signed)
July 06, 2007    [name], M.D.  [address]  [place],  [place] [address]   RE:  [name], [name]  MRN:  [id number]  /  DOB:  March 21, 1921   Dear Ian Malkin:   Ms. Speas returns to the office for continued assessment and treatment  of congestive heart failure due either to mitral regurgitation or  diastolic dysfunction.  She has done fairly well in the two weeks since  I last saw her, but continues to have a non-productive cough.  She  thinks this is due to a URI, but has not had other symptoms of same.  She felt that she was hypoglycemic lately, and you were kind enough to  stop Glipizide.  Now, she is concerned that her glucose is in the low  140s.   EXAMINATION:  GENERAL:  On exam, a pleasant woman in no acute distress.  VITAL SIGNS:  The weight is 145, six times more than earlier in  November.  Blood pressure 140/80, heart rate 95 and irregular,  respirations 20.  NECK:  Moderate jugular venous distention.  LUNGS:  Essentially clear.  CARDIAC:  Normal first and second heart sounds; grade 1-2-6 holosystolic  apical murmur.  ABDOMEN:  Soft and nontender; no organomegaly.  EXTREMITIES:  1- 2+ ankle edema.   Chest x-ray:  Improved edema.   IMPRESSION:  [name] continues to be marginally compensated.  She is  only taking furosemide b.i.d. two days per week, because of problems  with frequent urination.  I explained  that it is critical that she maintain a good volume status, with less  concern for urination.  She promises to do better.  I will give her  Robitussin with codeine for her cough, which is more likely to reflect  CHF than a URI.  Due to a relatively rapid heart rate, we will increase  Bystolic to 10 mg q.d.  I plan to see this nice woman again in one  month.    Sincerely,      Gerrit Friends. Dietrich Pates, MD, [facility]  Electronically Signed    RMR/MedQ  DD: 07/06/2007  DT: 07/06/2007  Job #: [id number]

## 2010-12-25 NOTE — Discharge Summary (Signed)
NAMEKETINA, MARS                  ACCOUNT NO.:  [id number]   MEDICAL RECORD NO.:  [id number]          PATIENT TYPE:  INP   LOCATION:  A222                          FACILITY:  [facility]   PHYSICIAN:  [name], M.D.        DATE OF BIRTH:  1921-07-01   DATE OF ADMISSION:  11/20/2006  DATE OF DISCHARGE:  04/14/2008LH                               DISCHARGE SUMMARY   ADDENDUM   DISCHARGE MEDICATIONS:  1. Tylenol 650 mg p.o. q.6h. p.r.n. for fever or pain.      [name], M.D.  Electronically Signed     ZH/MEDQ  D:  12/10/2006  T:  12/10/2006  Job:  [id number]

## 2010-12-25 NOTE — Letter (Signed)
June 22, 2007    [name], M.D.  [address]  [place],  [place] [address]   RE:  [name], [name]  MRN:  [id number]  /  DOB:  [birth year]   Dear Ian Malkin:   [name] returns to the office for close followup of congestive heart  failure with preserved left ventricular systolic function.  Since her  last visit, she is somewhat improved.  She has been taking furosemide 80  mg b.i.d. on most days.  She omits the p.m. dose if she is going out.  She notes no dyspnea.  Edema is somewhat decreased.   On exam, a very pleasant woman in no acute distress.  The weight is 139,  three pounds less than 5 days ago.  Blood pressure 116/54, heart rate 64  and irregular, respirations 16.  NECK:  No jugular venous distention.  LUNGS:  Clear.  CARDIAC:  Normal first and second heart sounds.  EXTREMITIES:  Ankle edema 1 to 2+.   IMPRESSION:  [name] has lost some weight with an increased dosage of  furosemide.  Her exam is improved.  Her BNP level was in excess of 600  at her last visit.  Chemistry profile was  normal including glucose 101.  She reports lowish fasting blood sugars  by fingerstick with values in the 70s and today in the mid 60s.  She  will consult you regarding this in the near future.  She also  experienced a leg cramp last night in her left thigh.  I will check a  chemistry profile, recheck a chest x-ray in 1 week and plan to see this  nice woman again in 2 weeks.    Sincerely,      Gerrit Friends. Dietrich Pates, MD, [facility]  Electronically Signed    RMR/MedQ  DD: 06/22/2007  DT: 06/22/2007  Job #: [id number]

## 2010-12-25 NOTE — Letter (Signed)
July 01, 2008    [name], MD  [address] [place], [place] [address]   RE:  EATHER, CHAIRES  MRN:  [id number]  /  DOB:  25-Nov-1920   Dr. Ian Malkin,   Ms. Siler returns to the office for continued assessment and treatment  of atrial fibrillation and congestive heart failure with preserved left  ventricular systolic function.  I last saw her in February and was  scheduled to reevaluate her in May, but that appointment was missed and  is now kept in a delayed fashion.  She has, fortunately, done fairly  well.  She reports generalized weakness, but no exertional dyspnea.  She  has had no chest discomfort.  She has had no exacerbations of congestive  heart failure.  She does not follow her weight at home.  She has fairly  significant chronic pedal edema for which she wears compression  stockings.  She was recently found to have moderately impaired ABIs and  was referred to Dr. Excell Seltzer, but failed to appear for that appointment.  Per your communication, we will be happy to reschedule her for  evaluation.   She had stool specimens in February of this year that were all negative.  A recent CBC is normal as is her chemistry profile.   PHYSICAL EXAMINATION:  GENERAL:  Pleasant woman in no acute distress.  VITAL SIGNS:  The weight is 145, 11 pounds more than in February.  Blood  pressure is 110/75, heart rate is 72 and irregular, and respiration is  14.  NECK:  No jugular venous distention.  LUNGS:  Marked kyphosis; clear lung fields.  CARDIAC:  Normal first and second heart sound; grade 2/6 holosystolic  murmur at the left sternal border and apex.  ABDOMEN:  Soft and nontender; normal bowel sounds; no organomegaly.  EXTREMITIES:  2+ pitting pretibial and pedal edema.  Pulses are trace to  absent.   IMPRESSION:  Ms. Hornbaker is doing fairly well from a symptomatic  standpoint.  Her malaise and difficulty getting around are  multifactorial in etiology.  She has gained a significant amount of  weight and has substantial pedal edema.  Her last CAT scan of her chest  done in September showed small bilateral effusions.  I would be inclined  to increase her diuretics, but she refuses to do so, due to the fact  that she urinates frequently throughout the night.  We will go ahead and  check a BNP level and chemistry profile.  If the former is substantially  elevated, it will be necessary to add a new diuretic to Ms. Tristan'  regimen.  We will continue to monitor anticoagulation, which is now  therapeutic, and plan a return office visit in 3 months.    Sincerely,      Gerrit Friends. Dietrich Pates, MD, [facility]  Electronically Signed    RMR/MedQ  DD: 07/01/2008  DT: 07/02/2008  Job #: [id number]

## 2010-12-28 NOTE — Cardiovascular Report (Signed)
[name], [name]                            ACCOUNT NO.:  [id number]   MEDICAL RECORD NO.:  [id number]                   PATIENT TYPE:  INP   LOCATION:  6522                                 FACILITY:  [facility]   PHYSICIAN:  Charlies Constable, M.D.                  DATE OF BIRTH:  [birth year]   DATE OF PROCEDURE:  12/27/2002  DATE OF DISCHARGE:  12/28/2002                              CARDIAC CATHETERIZATION   PROCEDURE PERFORMED:  Percutaneous coronary intervention.   CARDIOLOGIST:  Charlies Constable, M.D.   INDICATIONS FOR PROCEDURE:  The patient is a 77 years old and was admitted  to the hospital with paroxysmal atrial fib and jaw pain.  She has diabetes,  hypertension and a recently positive Cardiolite showing anterior ischemia.  She underwent diagnostic angiography by Dr. Antoine Poche who found an 80% lesion  in the mid LAD.  We made the decision to proceed with intervention.  She was  enrolled in the acuity trial and was randomized to Angiomax.  Her troponins  were positive at 0.22.   DESCRIPTION OF PROCEDURE:  The procedure was performed via the right femoral  artery using arterial sheath and a 6 French JL3.5 guiding catheter with side  holes.  The patient was given Angiomax bolus and infusion per acuity  protocol and was also given Plavix before the procedure.  We crossed the  lesion in the mid LAD with the Luge wire without difficulty.  We direct  stented with a 3.0 x 16 TAXUS stenting deploying this with one inflation 18  atmospheres for 40 seconds.  The post stent deployment picture showed some  slight dye hang up in the proximal edge of the stent as we were concerned  about an edge tear.  For this reason we performed IVUS.  We only advanced  the IVUS halfway down the stent because the distal vessel tapered very  quickly and the caliber of the vessel was quite small.  The IVUS did not  document any dissection, but did document that at the very proxima. Edge  there were a few  structures that appeared to be malapposed.  For this reason  we went back in with a 3.5 x 12 mm Quantum Maverick and performed two  inflations up to 12 atmospheres for 30 seconds.  Repeat diagnostic films  were then performed through the guiding catheter.  On the repeat studies  there was no dye hang up at the proximal edge of the stent.   RESULTS:  Initially the stenosis in the mid LAD was estimated at 80%.  Following stenting this improved to 0%.  There was also some mild narrowing  about 8 mm proximal to the end edge.  The IVUS run showed a widely patent  stent in the mid and proximal portions where we visualized the stent with a  few struts of malapposition right at the proximal edge.  We did not perform  repeat IVUS after the post dilatation.    CONCLUSION:  Successful stenting of the lesion in the mid left anterior  descending artery with a TAXUS stent with improvement in percent narrowing  from 80% to 0%.   DISPOSITION:  The patient went to the recovery room for further observation.                                                 Charlies Constable, M.D.    BB/MEDQ  D:  12/27/2002  T:  12/28/2002  Job:  [id number]   cc:   Noble Bing, M.D.   Bernerd Limbo. Leona Carry, M.D.  [address]  [place]  [place] [address]  Fax: [phone]   Rollene Rotunda, M.D.

## 2010-12-28 NOTE — Discharge Summary (Signed)
[name], [name]                  ACCOUNT NO.:  [id number]   MEDICAL RECORD NO.:  [id number]          PATIENT TYPE:  INP   LOCATION:  A228                          FACILITY:  [facility]   PHYSICIAN:  Osvaldo Shipper, MD     DATE OF BIRTH:  Dec 17, 1920   DATE OF ADMISSION:  04/04/2005  DATE OF DISCHARGE:  LH                                 DISCHARGE SUMMARY   CURRENT DIAGNOSES:  1.  Acute cholecystitis status post laparoscopic cholecystectomy by Dr.      Lovell Sheehan, clinically improved.  2.  History of coronary artery disease currently stable.  3.  Rapid atrial fibrillation in hospital, currently rate is controlled and      sinus.  4.  Flash pulmonary edema while in the intensive unit, currently improving.  5.  Type 2 diabetes.  6.  Diarrhea currently improving.   Please review H&P dictated at the time of admission for details regarding  patient's presenting illness.   BRIEF HOSPITAL COURSE:  Problem 1.  Cholecystitis. The patient is an 77-year-  old Caucasian female who presented to the ED with complaints of pain in her  right upper quadrant. The patient underwent CAT scan followed by an  ultrasound which suggested acute cholecystitis. The patient was consulted  with Dr. Lovell Sheehan. Subsequently underwent cholecystectomy the following day.  The patient continued to have significant abdominal discomfort after surgery  hence repeat CAT scan was obtained which showed a possible small hematoma  but no definite abscess or fluid collection. Generalized ileus was seen  along with distention of stomach. Subsequently an NG tube was placed in this  patient with significant relief of her symptoms. Once again another CT scan  was done the following day which suggested ascites, and gas in the  gallbladder fossa. Dr. Lovell Sheehan reviewed the CT and started the patient on  antibiotics but no active surgical intervention was done. The patient's  symptoms improved spontaneously on the above therapy. The NG tube  was  subsequently removed. The patient was started on p.o., and she did well.  Surgical clips were removed today by Dr. Lovell Sheehan, and he has signed off on  this patient as of today.   Problem 2.  Cardiac. The patient has a history of coronary artery disease  with PTCI done in 2004 when a stent was placed in her LAD. The patient did  well from her coronary artery disease standpoint. The patient also has a  history of paroxysmal atrial fibrillation. She used to be on amiodarone at  one point. During her intensive care unit stay her heart rate went up. She  went into rapid atrial fibrillation at the rate of 140/150s requiring  Cardizem drip. The patient was restarted on her amiodarone. The Cardizem  drip was titrated to p.o. Cardizem, and her rate has been controlled ever  since. Currently she is in sinus rhythm. The patient has been followed by [facility] during her hospital stay.   Another complication from cardiac standpoint was development of flash  pulmonary edema while she was in intensive care unit. She  was getting fluids  hence resulting in the above. The patient was diuresed with IV Lasix. Her  respiratory status improved. She has done well in that respect. Today, the  patient seems to have increased crackles in her lungs. Her Lasix was changed  to p.o. yesterday, however, we will be changing it back to IV today. She  might need a few more days of IV Lasix. Echocardiogram was checked on this  patient which showed mild to moderate left ventricular hypertrophy,  hyperdynamic regional left global function was seen. Otherwise no other  significant abnormalities were seen on this patient's echo. The patient has  also been started on Coumadin for anticoagulation. The patient was very  reluctant initially, however, after explaining the risks and benefits she  agreed to the same. Her INR today is elevated at 3.2. I have held Coumadin  today. Her dosages probably need to be  adjusted based on PT/INR which will  be done tomorrow.   Problem 3.  Diabetes. The patient has a history of type 2 diabetes. She is  on Glipizide at home. Her oral agent had been held because of her operative  status as well as her NPO status and lately because of CHF. She is just on  sliding scale Insulin. Her sugars though not completely optimal are not out  of control. She will probably need to switch back to her OHA once she is  ready for discharge.   Problem 4.  Diarrhea. The patient developed diarrhea during this hospital  stay. Initially she was having ileus as mentioned above. The patient was  started on MiraLax by Dr. Lovell Sheehan with which she experienced bowel  movements, however, this converted to a diarrhea. I checked C. difficile and  stool for wbc because the patient was on antibiotics both of which were  negative. The MiraLax has been discontinued. Antibiotics have been  discontinued. The patient has been started on Imodium on a p.r.n. basis. I  believe the diarrhea should be controlled in the next few days. She is  having only about 3-4 bowel movements a day with no incontinence. Her  abdomen is soft with minimal tenderness over the site.   Problem 5.  Hypertension. The patient has a history of severe hypertension  requiring multiple medications. Currently she is on clonidine, Diltiazem,  metoprolol, minoxidil and Altace with which her blood pressure is reasonably  controlled.   Problem 6.  Physical therapy the patient has been undergoing PT for the past  3 days ever since she has been out of the intensive care unit. The patient  is easily fatigued on walking. She is not very safe and her risk of fall  increases. This is very concerning especially now that she is on Coumadin. I  have discussed this with the patient. I believe considering that she stays  alone at home she will benefit from skilled nursing facility on discharge. The patient is agreeable. I have communicated  this to Lovette Cliche, and we  will be pursuing bed for her at this time. Currently patient is not quite  ready for discharge. She might be in the next 2-3 days. Since the Labor Day  weekend is coming up she probably will be ready after this weekend is over.  However, if patient does respond to the Lasix appropriately and if her INR  is controlled she might be ready earlier as well.   CURRENT MEDICATIONS:  This may be changed on final discharge and include:  1.  Amiodarone 200 mg p.o. daily.  2.  Aspirin 81 mg p.o. daily.  3.  Clonidine 0.3 mg p.o. b.i.d.  4.  Diltiazem 360 mg p.o. daily.  5.  Lasix 40 mg currently IV q.12. This will need obviously to be changed to      be p.o.  6.  Metoprolol 50 mg p.o. b.i.d.  7.  Minoxidil 5 mg p.o. daily.  8.  Protonix 40 mg p.o. daily.  9.  Altace 5 mg p.o. b.i.d.  10. Coumadin dose to be determined.  11. Tylenol 650 mg p.o. q.6h. p.r.n. for pain.  12. The patient needs to be on Glipizide on discharge.   FOLLOW UP CARE:  1.  With cardiology Dr. Dietrich Pates or Dr. Dorethea Clan a week after discharge.  2.  With primary care doctor Dr. Leona Carry about 2-3 weeks after discharge      will be appropriate.   LABORATORY WORK:  The patient may need to have a BMP checked about 3-4 days  after discharge. She will also need to have a PT/INR checked at least daily  initially until her INR is stabilized between 2 and 3.   PROCEDURES PERFORMED DURING THIS HOSPITAL STAY:  1.  Laparoscopic cholecystectomy, cholangiograms and umbilical herniorrhaphy      performed by Franky Macho, M.D. on August 25 for acute cholecystitis.  2.  The patient had an echocardiogram as mentioned above.   CONSULTATIONS:  1.  Dr. Lovell Sheehan.  2.  Dr. Dietrich Pates and Dr. Dorethea Clan.      Osvaldo Shipper, MD  Electronically Signed     GK/MEDQ  D:  04/12/2005  T:  04/12/2005  Job:  [id number]   cc:   Bernerd Limbo. Leona Carry, M.D.  [address]  [place]  [place] [address]  Fax: [phone]   [place] Bing, M.D. [facility]  [address]  [address]  [place]  [place] [address]   Dalia Heading, M.D.  [address] [place]  [place] [address]  Fax: [phone]

## 2010-12-28 NOTE — Group Therapy Note (Signed)
   NAME:  ANTONINETTE, LERNER                            ACCOUNT NO.:  [id number]   MEDICAL RECORD NO.:  [id number]                   PATIENT TYPE:  EMS   LOCATION:  ED                                   FACILITY:  [facility]   PHYSICIAN:  Edward L. Juanetta Gosling, M.D.             DATE OF BIRTH:  1920/09/07   DATE OF PROCEDURE:  01/03/2003  DATE OF DISCHARGE:  01/08/2003                                   PROGRESS NOTE   TIME SEEN:  9:25.   SUBJECTIVE:  Rhythm is a sinus rhythm with what appear to be PACs.  The rate  is in the 50s.  Lateral T-wave abnormalities are seen.  Abnormal  electrocardiogram.                                               Oneal Deputy. Juanetta Gosling, M.D.    ELH/MEDQ  D:  01/19/2003  T:  01/20/2003  Job:  [id number]

## 2010-12-28 NOTE — Consult Note (Signed)
NAMEJAKIAH, [name]                  ACCOUNT NO.:  [id number]   MEDICAL RECORD NO.:  [id number]          PATIENT TYPE:  INP   LOCATION:  A203                          FACILITY:  [facility]   PHYSICIAN:  [name] Friends. Dietrich Pates, MD, FACCDATE OF BIRTH:  [birth year]   DATE OF CONSULTATION:  05/21/2006  DATE OF DISCHARGE:                                   CONSULTATION   REFERRING PHYSICIAN:  Dr. Margo Aye   HISTORY OF PRESENT ILLNESS:  An 77 year old woman with permanent atrial  fibrillation and history of coronary disease referred for evaluation of  chest discomfort following a hip fracture.  Ms. [name] describes standing in  her church parking lot and then beginning to sway and then falling to the  ground.  She ended up with her neck flexed against a truck, but was unable  to move and EMS was summoned.  In the emergency department, after receiving  intravenous morphine, she noted a mild aching discomfort in her  epigastric/lower chest region that radiated bilaterally along her costal  margins.  This resolved spontaneously.  She subsequently received another  dose of morphine and had similar symptoms.  EKGs showed no acute  abnormalities.  Cardiac markers have been negative.   Ms. [name] originally presented with paroxysmal atrial fibrillation in 2004,  but subsequently developed permanent atrial fibrillation requiring chronic  anticoagulation.  She presented with chest discomfort and was found to have  an 80% LAD lesion in May, 2004 for which a stent was placed.  Coronary  disease has been stable since that time.  Cardiovascular risk factors  include hypertension and diabetes not requiring insulin therapy.  She has  insignificant cerebrovascular disease as of October, 2006 when a duplex  study was performed.   PAST MEDICAL HISTORY:  Otherwise notable for carcinoma of the breast.  She  underwent a left mastectomy in [id number].  She has osteoporosis.  Surgeries have included partial hysterectomy,  cholecystectomy in 2006.   MEDICATIONS:  Are extensive, and as listed in the chart.  She has no known  allergies.   SOCIAL HISTORY:  Widowed; lives alone in [place]; retired Comptroller;  remains quite active.  No history of tobacco use nor excessive use of  alcohol.   FAMILY HISTORY:  Mother died of myocardial infarction at age 48; father also  suffered fatal myocardial infarction.   REVIEW OF SYSTEMS:  Notable for intermittent left leg pain; intermittent  mild pedal edema.  She has occasional constipation.  She has a chronic  nonproductive cough.  She experiences intermittent joint discomfort  including in her knees and hips.  She requires corrective lenses.  She  reports urinary frequency but not much in way of not nocturia.  All other  systems reviewed and are negative.   EXAMINATION:  GENERAL:  Pleasant, pale woman in traction.  VITAL SIGNS:  The blood pressure is 170/85, heart rate 80 and irregular,  respirations 20, temperature 98.6.  HEENT:  Anicteric sclerae; normal lids and conjunctivae.  NECK:  Bilateral bruits, more prominent on the right; no jugular venous  distension.  CARDIAC:  Normal  first and second heart sounds; modest systolic ejection  murmur; normal PMI.  LUNGS:  Decreased breath sounds at the bases.  ABDOMEN:  Soft and nontender; no organomegaly; no masses; normal bowel  sounds.  EXTREMITIES:  Left leg is shortened and externally rotated; pulses intact.   EKG:  Atrial fibrillation with controlled ventricular response; nonspecific  ST-T-wave abnormality.   Laboratory otherwise notable for hemoglobin of 12, BUN of 26, creatinine of  1.0, and INR of 2.4.  Cardiac markers have been negative.   IMPRESSION:  Ms. [name] has chronic atrial fibrillation with an number of  risk factors for thromboembolism, most notably her age, sex, hypertension,  and diabetes.  Nonetheless, she could be treated with vitamin K and no  heparin pending early surgical repair of her  hip fracture.  Postoperative  anticoagulation can be with low-dose low-molecular weight heparin and  Coumadin.  If hypertension remains out of control, we will need to adjust  her medical regime.  Metformin has been appropriately held in terms of  therapy for her diabetes; insulin can be substituted.   We greatly appreciate the opportunity continue to follow this nice woman and  will do so during her hospitalization.      [name] Friends. Dietrich Pates, MD, [facility]  Electronically Signed     RMR/MEDQ  D:  05/22/2006  T:  05/23/2006  Job:  [id number]

## 2010-12-28 NOTE — Discharge Summary (Signed)
NAME:  [name], POTENZA                  ACCOUNT NO.:  [id number]   MEDICAL RECORD NO.:  [id number]          PATIENT TYPE:  INP   LOCATION:  A326                          FACILITY:  [facility]   PHYSICIAN:  Margaretmary Dys, M.D.DATE OF BIRTH:  [birth year]   DATE OF ADMISSION:  04/03/2005  DATE OF DISCHARGE:  09/05/2006LH                                 DISCHARGE SUMMARY   ADDENDUM:  Kindly review discharge summary dictated by Dr. Osvaldo Shipper on  April 12, 2005 outlining the patient's hospital course up to that point.   I took over the patient's care on April 13, 2005 and she did not have any  other acute event.  She remained mostly stable.  The patient was in the  hospital through the Labor Day weekend because of the holiday.  She is being  transferred to a skilled nursing facility now for rehab over the next 3-4  weeks.   FOLLOW UP:  1.  Dr. Dietrich Pates or Dr. Dorethea Clan in 2-3 weeks.  2.  With her primary care doctor Dr. Leona Carry in 2-3 weeks.   LABORATORY DATA:  Kindly check a PT INR every three days until the INR  stabilizes.  Kindly send the results to Dr. Leona Carry and Dr. Dorethea Clan.   DISCHARGE MEDICATIONS:  1.  Amiodarone 200 mg p.o. daily.  2.  Aspirin 81 mg p.o. daily.  3.  Clonidine 0.3 mg p.o. b.i.d.  4.  Cardizem 360 mg p.o. daily.  5.  Lasix 80 mg p.o. daily.  6.  Metoprolol 50 mg p.o. b.i.d.  7.  Minoxidil 5 mg p.o. daily.  8.  Protonix 40 mg p.o. daily.  9.  Altace 5 mg p.o. b.i.d.  10. Coumadin 2 mg p.o. daily  11. Tylenol 650 mg p.o. q.6h. p.r.n.  12. Glipizide 10 mg p.o. daily.  13. Potassium 20 mEq p.o. daily.   DIET:  Heart Healthy low salt diet, 1800 ADA consistent carbohydrate  diabetic diet.   ACTIVITY:  As recommended by rehab/ PT/OT  Limitations  kindly institute fall precautions.      Margaretmary Dys, M.D.  Electronically Signed     AM/MEDQ  D:  04/16/2005  T:  04/16/2005  Job:  [id number]   cc:   Vida Roller, M.D.  Fax: [phone]   [place] Bing, M.D. [facility]  [address]  [address]  [place]  [place] [address]   Bernerd Limbo. Leona Carry, M.D.  [address]  [place]  [place] [address]  Fax: [phone]

## 2010-12-28 NOTE — Op Note (Signed)
[name], [name]                  ACCOUNT NO.:  [id number]   MEDICAL RECORD NO.:  [id number]          PATIENT TYPE:  INP   LOCATION:  A208                          FACILITY:  [facility]   PHYSICIAN:  Dalia Heading, M.D.  DATE OF BIRTH:  June 10, 1921   DATE OF PROCEDURE:  04/05/2005  DATE OF DISCHARGE:                                 OPERATIVE REPORT   PREOPERATIVE DIAGNOSIS:  Cholecystitis, cholelithiasis, umbilical hernia.   POSTOPERATIVE DIAGNOSIS:  Cholecystitis, cholelithiasis, umbilical hernia.   PROCEDURE:  Laparoscopy cholecystectomy with cholangiograms, umbilical  herniorrhaphy.   SURGEON:  Dr. Franky Macho.   ASSISTANT:  Dr. Arna Snipe.   ANESTHESIA:  General endotracheal.   INDICATIONS:  The patient is an 77 year old white female who presents with  cholecystitis and cholelithiasis as well as an umbilical hernia. The risks  and benefits of the procedure including bleeding, infection, hepatobiliary  injury, cardiopulmonary difficulties, and the possibility of an open  procedure were fully explained to the patient, who gave informed consent.   PROCEDURE NOTE:  The patient was placed in a supine position. After  induction of general endotracheal anesthesia, the abdomen was prepped and  draped using the usual sterile technique with Betadine. Surgical site  confirmation was performed.   A supraumbilical incision was made down to fascia. A Veress needle was  introduced into the abdominal cavity, and confirmation of placement was done  using the saline drop test. The abdomen was then insufflated to 16 mmHg  pressure. An 11-mm trocar was introduced into the abdominal cavity under  direct visualization without difficulty. The patient was placed in reversed  Trendelenburg position. An additional 11-mm trocar was placed in the  epigastric region, and 5-mm trocars were placed in the right upper quadrant  and right flank regions. Liver was inspected and noted to normal limits.  The  gallbladder was noted be edematous and swollen. The gallbladder was  retracted superiorly and laterally. The dissection was begun around the  infundibulum of the gallbladder. The cystic duct was first identified. It  juncture to the infundibulum fully identified. An Endoclip was placed  proximally on the cystic duct. An incision was made into the cystic duct,  and a cholangiocatheter was then inserted. Cholangiograms were then  performed under digital fluoroscopy. The hepatobiliary tree was noted to be  diffusely dilated, though there were no intrahepatic filling defects or  evidence of choledocholithiasis. The dye flowed freely into the duodenum.  The cholangiocatheter was removed after the system was flushed with normal  saline, and multiple Endoclips were placed distally on cystic duct, and  cystic duct was divided. The cystic artery was likewise ligated and divided.  The gallbladder was freed away from the gallbladder fossa using Bovie  electrocautery. Gallbladder delivered through the epigastric trocar site  using EndoCatch bag without difficulty.   All tape and needle counts were correct at the end of procedure. The  supraumbilical incision was extended, and the umbilical herniorrhaphy was  repaired using 0 Surgitek interrupted sutures. The epigastric fascia was  reapproximated using a 0 Vicryl interrupted suture. All skin incisions  were  closed using staples. Sensorcaine 0.5% was instilled in the surrounding  wounds. Betadine ointment and dry sterile dressings were applied.   All tape and needle counts were correct at the end of procedure. The patient  was extubated in the operating room and went back to recovery room awake in  stable condition.   COMPLICATIONS:  None.   SPECIMEN:  Gallbladder.   BLOOD LOSS:  Minimal.      Dalia Heading, M.D.  Electronically Signed     MAJ/MEDQ  D:  04/05/2005  T:  04/05/2005  Job:  [id number]   cc:   Bernerd Limbo. Leona Carry, M.D.   [address]  [place]  [place] [address]  Fax: [phone]

## 2010-12-28 NOTE — Letter (Signed)
August 15, 2006    [name], M.D.  [address]  [place],  [place] [address]   RE:  BERNA, GITTO  MRN:  [id number]  /  DOB:  1921-01-19   Dear Ian Malkin:   Ms. Kallen returns to the office for continued assessment and treatment  of what is now chronic atrial fibrillation. Since her last visit, she  has done fine from a cardiac standpoint; however, she suffered a left  hip fracture requiring open reduction and internal fixation in October  of last year and subsequently required a stay in rehabilitation.  She  has recently had a cataract operation and is scheduled to have the other  lens done in the near future.  She has recently seen you for a URI,  which is improved.  She reports chronic hoarseness.  Her chronic cough  appears to not be a big issue at present.  She has no dyspnea nor chest  discomfort.  She has not had palpitations nor syncope.  The fall that  caused her hip fracture did not appear to be accompanied by loss of  consciousness.   CURRENT MEDICATIONS:  Include:  1. Evista 60 mg daily.  2. Glipizide 10 mg daily.  3. Clonidine 0.3 mg b.i.d.  4. Crestor 10 mg daily.  5. Aspirin 81 mg daily.  6. Diltiazem 360 mg daily.  7. Warfarin as directed with stable and therapeutic anticoagulation.  8. KCl 30 mEq b.i.d.  9. Diovan 160 mg daily.  10.Torsemide 50 mg daily.  11.Minoxidil 2.5 mg daily.  12.Levothyroxine 0.025 mg daily.  13.Metformin 500 mg b.i.d.  14.Metoprolol 25 mg b.i.d.   EXAMINATION:  A pleasant woman walking with a walker in no acute  distress.  The weight is 153, one pound more than in July of last year.  Blood  pressure 110/60.  Heart rate is 60 and irregular. Respirations 16.  NECK:  Minimal jugular venous distention; normal carotid upstrokes  without bruits.  THORAX:  Moderate kyphosis; clear lung fields.  CARDIAC:  Irregular rhythm; normal 1st and 2nd heart sounds.  ABDOMEN:  Soft and nontender; no organomegaly.  EXTREMITIES:  1+ ankle edema.   IMPRESSION:  Ms. Giddings appears to be doing generally well.  She has  unilaterally decreased her dose of torsemide from 100 to 50 mg daily.  This does not appear to have caused any weight gain or other problems.  We will check repeat labs including a CBC, chemistry profile and stool  for Hemoccult testing.  Vaccinations are up to date.  I suggested she  discuss possible referral to an ear, nose and throat physician for her  chronic hoarseness.  I will decrease her dose of diltiazem to 240 mg per  day due to a relatively slow heart rate.  She will return to the office  in 6 months.    Sincerely,      Gerrit Friends. Dietrich Pates, MD, [facility]  Electronically Signed    RMR/MedQ  DD: 08/15/2006  DT: 08/15/2006  Job #: [id number]

## 2010-12-28 NOTE — Group Therapy Note (Signed)
[name], [name]                  ACCOUNT NO.:  [id number]   MEDICAL RECORD NO.:  [id number]          PATIENT TYPE:  INP   LOCATION:  A222                          FACILITY:  [facility]   PHYSICIAN:  [name], M.D.        DATE OF BIRTH:  [birth year]   DATE OF PROCEDURE:  11/23/2006  DATE OF DISCHARGE:  11/24/2006                                 PROGRESS NOTE   DATE OF BIRTH:  1920/10/08   SUBJECTIVE:  [name] is an 77 year old white female with multiple  medical problems including chronic AFib who presented with RVR and  significant fluid overload felt to be secondary to congestive heart  failure.  At this point does not have any significant complaints, states  that she slept well and eating and drinking well without any problems.   OBJECTIVE:  Vital signs:  Temperature is 97.3, blood pressure 135/67,  pulse 78, respirations 18 to 20, satting 92 to 96% on room air.  CBGs  have been 135.  Ins and outs: Again she is negative 1300 since  yesterday.  Generally this is an elderly white female lying in bed in no acute  distress.  HEENT:  Unremarkable.  Neck: No JVD.  Lungs showed good air  movement throughout.  No crackles appreciated at this time.  Heart:  Irregular regular but slow.  Abdomen: Soft, nontender, positive bowel  sounds.  Extremities:  No lower extremity edema.  Mild pain to palpation  which has been chronic.  2+ pulses in all extremities.  Neurologic:  Grossly intact.  No deficits appreciated.   LABORATORY DATA:  B-MET is still pending at this time.   IMPRESSION:  77 year old white female with chronic heart issues seen by  Dr. Dietrich Pates.   ASSESSMENT/PLAN:  1. Congestive heart failure: She continues to diurese well, slightly      slowing down at this time.  Will check a B-MET to make sure she is      not getting over-diuresed.  Still awaiting full 2D echo results,      any further suggestions that Dr. Dietrich Pates may have prior to      discharge.  2. Atrial fibrillation  with RVR:  She is on the Cardizem and appears      to be doing well at this time.  May need to titrate up the dose to      keep it even slower but will defer to Dr. Dietrich Pates for that      decision.  3. Diabetes:  Continue on the Glipizide at this time.  They still      remain in the low range.  4. Hypertension, doing well.  5. Chronic anticoagulation:  PTNR is stable.  Continue on current      Coumadin dose.  6. Generalized debility and weakness:  Will get Physical Therapy to      assess patient to see if she needs any further needs, both at home.   DISPOSITION:  Will transition to p.o. Lasix at this time and recheck lab  work in the morning.  If continues to do well the patient will be  discharged tomorrow with followup with Cardiology.      [name], M.D.  Electronically Signed     ZH/MEDQ  D:  11/23/2006  T:  11/23/2006  Job:  [id number]

## 2010-12-28 NOTE — H&P (Signed)
NAMELORETTO, [name] NO.:  [id number]   MEDICAL RECORD NO.:  [id number]                   PATIENT TYPE:  INP   LOCATION:  2027                                 FACILITY:  [facility]   PHYSICIAN:  Jonelle Sidle, M.D. [facility]        DATE OF BIRTH:  1920-10-06   DATE OF ADMISSION:  01/08/2003  DATE OF DISCHARGE:                                HISTORY & PHYSICAL   PHYSICIANS:  1. [place] cardiologist [place] Bing, M.D.  2. Primary care physician Bernerd Limbo. Destefano, M.D.   CHIEF COMPLAINT:  Jaw pain and rapid heart rate.   HISTORY OF PRESENT ILLNESS:  The patient is a very pleasant 77 year old  woman with a history of  hypertension, dyslipidemia, paroxysmal atrial  fibrillation with rapid ventricular response and coronary artery disease,  status post recent admission with cardiac catheterization on Dec 27, 2002.  The patient had presented at that time with jaw discomfort  and rapid atrial  fibrillation which had ultimately spontaneously resolved to normal sinus  rhythm. She has had a Cardiolite showing a mild degree of anterior ischemia  and was referred for cardiac catheterization which revealed an 80% left  anterior descending stenosis, ultimately requiring stent placement. Residual  disease included a 50% septal perforator, 50% ostial  1st diagonal, 70%  ostial 1st obtuse marginal, and a 50% ostial right coronary artery. The  ejection fraction was in the 65% range.   The patient tolerated this well and tells me that she saw Dr. Dietrich Pates this  past Tuesday. At that time she was taken off of Cardizem CD and Lopressor in  favor of amiodarone. Coumadin was also discussed  at that time and the  patient continues to have significant concerns about starting this medicine.   This morning the patient awoke at approximately 1:30 with lower jaw  discomfort and the sensation of rapid palpitations. These symptoms persisted  for approximately 4 hours, ultimately  prompting evaluation  in the [facility] emergency department. The patient received intravenous  Cardizem and spontaneously converted to normal sinus rhythm. Her initial  troponin I level was negative. Given recurrent symptomatology, she was  transferred to [facility] for further assessment. At present she  has no discomfort and remains in normal sinus rhythm with PACs and PVCs,  some in a pattern of bigeminy.   ALLERGIES:  No known drug allergies.   CURRENT MEDICATIONS:  1. Clonidine 0.3 mg p.o. b.i.d.  2. Hydrochlorothiazide 25 mg p.o. every day.  3. Altace 10 mg p.o. every day.  4. Glipizide 10 mg p.o. every day.  5. Plavix 75 mg p.o. every day.  6. Zocor 40 mg p.o. q. h.s.  7. Evista 60 mg p.o. every day.  8. Aspirin 325 mg p.o. every day.  9. Fosamax 70 mg every week.  10.      Multivitamin.  11.      Cod liver  oil.  12.      Caltrate as directed.  13.      Amiodarone 200 mg p.o. every day.  14.      The patient was recently taken off Cardizem CD 120 mg p.o. every     day and Lopressor 50 mg p.o. b.i.d.   PAST MEDICAL HISTORY:  1. Hypertension.  2. Breast cancer, status post left mastectomy.  3. Dyslipidemia, on statin therapy.  4. Coronary artery disease, status post cardiac catheterization on Dec 27, 2002, revealing an 80% left anterior descending stenosis, 50% septal     perforator stenosis, 80% ostial diagonal stenosis, 70% ostial obtuse     marginal stenosis and a 50% ostial RCA stenosis with an ejection fraction     of 65%. The patient underwent stent placement to the left anterior     descending at that time. A Taxus stent was used. An intravascular     ultrasound run following  stent placement showed a widely patent stent     with no clear  evidence of dissection. It was noted that there were a few     struts of mal apposition at the proximal edge and post dilatation was     performed.  5. Paroxysmal atrial fibrillation. The patient reports  a longstanding     history of irregular heart rate. She has had difficulties with recurrent     symptoms, particularly over the last several weeks.   SOCIAL HISTORY:  The patient is married and lives in [place]. She cares  for  her husband who has Alzheimer's dementia. She denies tobacco or alcohol  use.  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   FAMILY HISTORY:  Noncontributory for premature cardiovascular disease.   REVIEW OF SYSTEMS:  As described in the history of present illness. She  complains of cough in the winter months. This is nonproductive. She has had  no recent symptoms. She does describe an episode where she became short of  breath walking into [facility] from the parking lot for her  volunteer work. This was recently within the last few weeks. She has had no  orthopnea or PND, but does describe a minor degree of lower extremity edema.   PHYSICAL EXAMINATION:  VITAL SIGNS:  Blood pressure 99/68, heart rate 63 and  regular.  GENERAL:  This is a well nourished elderly woman, seated in no acute  distress.  HEENT:  Conjunctivae and lids normal. Oropharynx is clear.  NECK:  Supple without elevated jugular venous pressure. No bruits or  thyromegaly noted.  LUNGS:  Reveal somewhat decreased breath sounds but good air movement and no  wheezing  or rhonchi. Respiratory effort is nonlabored.  CARDIAC:  Regular rate and rhythm with an occasional ectopic beat and a soft  systolic murmur with no S3 gallop. There is no pericardial rub.  ABDOMEN:  Soft without hepatosplenomegaly or bruits.  EXTREMITIES:  Exhibit trace pitting edema, predominantly around the ankles  bilaterally.  SKIN:  Shows some varicosities of the lower extremities. Peripheral pulses  are 1 to 2+.  MUSCULOSKELETAL:  Shows mild kyphosis.  NEUROPSYCHIATRIC:  The patient was alert and oriented x3.  LABORATORY DATA:  Hemoglobin 13.4, hematocrit 39.5, WBCs 11.6, platelets  353.  Initial troponin I 0.02 with a CK of 65 and a CK-MB of 3.8. INR 1.0.  Liver function tests within normal limits. BUN 21, creatinine 0.8, potassium  3.4, glucose 196.  Chest x-ray results are pending. Initial 12-lead EKG shows rapid atrial  fibrillation. There are 143 beats per minute. There is voltage criteria for  left ventricular hypertrophy and predominantly lateral ST segment depression  with T-wave inversion. Followup tracing after conversion to normal sinus  rhythm shows sinus bradycardia at 49 beats a minute with resolution of ST  changes and  evidence of probable left atrial enlargement.   IMPRESSION:  1. Recurrent atrial fibrillation with rapid ventricular response associated     with lower jaw pain. The patient has had similar presentations in the     recent  past and recently underwent  stent placement to the left anterior     descending artery as outlined. Initial cardiac markers are negative. The     predominant question is whether the patient is having recurrent atrial     fibrillation with rapid ventricular response that precipitates angina, or     whether she has had a change in her coronary anatomy, prompting ischemia     and subsequent atrial fibrillation. In either event the patient was     recently placed on amiodarone for suppression of atrial fibrillation. She     has not decided clearly about Coumadin use long-term.  2. Hypertension.  3. Dyslipidemia.  4. Type 2 diabetes mellitus.  5. Hypokalemia.   PLAN:  1. Will admit the patient on telemetry and continue to cycle cardiac     markers.  2. Continue home medications. At this point we will hold Cardizem CD and     Lopressor as planned and load orally with amiodarone. Will also use     Lovenox for the time being, particularly until all cardiac markers have     been evaluated.  3. Will need to have further discussions regarding long-term Coumadin use.     Dr. Dietrich Pates has already addressed this problem as an  outpatient.  4. Anticipate repeat ischemic testing. If cardiac markers are negative, may     be able to proceed with a Cardiolite and proceed from there.  5. Replete potassium.                                                Jonelle Sidle, M.D. [facility]    SGM/MEDQ  D:  01/08/2003  T:  01/08/2003  Job:  [id number]

## 2010-12-28 NOTE — Group Therapy Note (Signed)
NAME:  MARCI, POLITO                  ACCOUNT NO.:  [id number]   MEDICAL RECORD NO.:  [id number]          PATIENT TYPE:  INP   LOCATION:  A222                          FACILITY:  [facility]   PHYSICIAN:  [name], M.D.        DATE OF BIRTH:  06/12/1921   DATE OF PROCEDURE:  11/21/2006  DATE OF DISCHARGE:                                 PROGRESS NOTE   SUBJECTIVE:  Ms. Govan is an 77 year old white female, with multiple  medical problems including chronic AFib, who has had progressive  weakness and shortness of breath with nighttime cough and orthopnea and  paroxysmal nocturnal dyspnea over last several months.  She was seen and  assessed by Dr. Dietrich Pates on the 9th and changed some of her medicines  but apparently she had a weakness episode and came to the emergency  department for further assessment.  Found to have AFib with RVR and  signs of congestive heart failure with fluid overload on chest x-ray and  exam.   At this point, she does not have any complaints.  She denies any chest  pain.  Feels that her shortness of breath has improved and her lower  extremity swelling has also improved.   OBJECTIVE:  VITAL SIGNS:  Temperature is 97, blood pressure 121/60,  pulse 69, respirations 18, satting 95% on 2 liters of oxygen.  Weight  has gone from 68.1 kg to 65.4 kg.   Ins and outs:  She is approximately down almost 4 liters since admission  to the hospital.  The CBGs have been 84 today.   PHYSICAL EXAMINATION:  GENERALLY:  This is an elderly, white female  lying in bed in no acute distress.  HEENT:  Unremarkable with bilateral cataract replacement.  NECK:  Shows no JVD at this time, no thyromegaly or lymphadenopathy.  LUNGS:  Show a few crackles at the bases bilaterally but appears to be  improved from previous.  HEART:  Is slow but irregularly irregular, no murmurs appreciated.  ABDOMEN:  Is soft, mildly protuberant, positive bowel sounds.  EXTREMITIES:  Approximately trace to 1+ lower  extremity edema much  improved from exam approximately 1 week ago.  NEUROLOGIC:  Grossly intact.  No deficits appreciated.   LAB WORK OBTAINED:  Cardiac panel shows CK of 46, MB of 2.1, troponin I  of 0.03, TSH is 1.491, BMET is normal.   IMPRESSION:  This is an 77 year old white female with chronic heart  issues seen and assessed by Dr. Dietrich Pates today for signs of rapid atrial  fibrillation and congestive heart failure type symptoms.   ASSESSMENT AND PLAN:  1. Congestive heart failure.  She is diuresing significantly at this      point and, per assessment from Dr. Dietrich Pates, wanted to stop the      patient's digoxin and go back to diltiazem 3 times a day for rate      control, also want to rule out any cardiac abnormalities with      cardiac enzymes.  A 2-D echo was also performed.  No other signs at  this time.  Compliance with her medications will be the biggest      issue given what her report is frequency of urination will need to      find optimal regimen for oral medicine prior to discharge.  2. Atrial fibrillation with rapid ventricular response.  Rate control,      as mentioned above.  3. Diabetes.  Metformin is on hold and continue glipizide.  Her blood      sugars have remained in the low state.  4. Hypertension.  No signs of hypertension at this time.  Will      continue on her current regimen of medications.  5. Chronic anticoagulation.  Will continue to check a prothrombin      time/international normalized ratio.  Was therapeutic at last check      with this.  Will continue her current Coumadin dose.  6. Generalized debility and weakness.  Will get Physical Therapy to      assess the patient prior to discharge, see how she is doing,      whether she would benefit from having some home physical therapy.      [name], M.D.  Electronically Signed     ZH/MEDQ  D:  11/21/2006  T:  11/21/2006  Job:  [id number]

## 2010-12-28 NOTE — Op Note (Signed)
[name]                  ACCOUNT NO.:  [id number]   MEDICAL RECORD NO.:  [id number]          PATIENT TYPE:  INP   LOCATION:  A203                          FACILITY:  [facility]   PHYSICIAN:  Vickki Hearing, M.D.DATE OF BIRTH:  [birth year]   DATE OF PROCEDURE:  05/22/2006  DATE OF DISCHARGE:                                 OPERATIVE REPORT   HISTORY:  This is an 77 year old female with atrial fibrillation and heart  disease who presented for fixation of a left hip fracture sustained on  May 20, 2006.  The patient was walking into church, lost her balance,  fell and fractured her left hip.  She sustained a pertrochanteric fracture  of the left hip.  She was on Coumadin.  She was given vitamin K.  Cardiology  and medical consult was obtained.  She was cleared for surgery today and was  brought to the OR for fixation of said fracture.   PREOP DIAGNOSIS:  Pertrochanteric fracture, left hip.   POSTOP DIAGNOSIS:  Pertrochanteric fracture, left hip.   PROCEDURE:  Open treatment internal fixation with gamma nail.   IMPLANT:  A gamma III nail, 125 degree angle, 100-mm lag screw, one set  screw, 42.5-mm distal locking screw in the static mode.   The patient was identified in the preop holding area as [name].  She was  given Ancef.  Her left hip was marked as the surgical site.  She was taken  to the operating room with antibiotics on board.  She had a spinal  anesthetic and was placed on the fracture table for manipulation.  After  manipulation of the fracture into stable configuration the left leg was  prepped and draped using sterile technique.   ANESTHETIC USED:  Spinal.   SURGEON:  Fuller Canada, M.D.   ASSISTANT:  [place].   OPERATIVE FINDINGS:  Comminuted pertrochanteric fracture at the base of the  neck shaft junction with essentially four parts.   After the time-out procedure was completed the left hip was addressed using  a proximal incision at the  greater trochanter, extended proximally.  The  subcutaneous tissue was divided, fascial layer was split and a curved awl  was used to penetrate the greater trochanter and femoral shaft.  A guidewire  was passed over the awl.  The awl was removed.  A cannulated reamer was  passed to the level of just below the lesser trochanter.  A 125 degree nail  was passed.   A small incision was made over the lateral femur.  The cannulated drill  guide was passed to the shaft of the femur.  Perforating drill bit was used  to open the femoral canal.  Adjustments were made until the guide pin was in  the center of the femoral head on AP and lateral x-rays.  We measured to 100  mm depth, passed a triple reamer and then passed a lag screw over the  guidewire.  The set screw was then placed, backed off a quarter turn, and  the guidewire was removed.   The lateral guide  was then set for the distal screw in the static mode.  A  small nick was made in the skin.  Blunt dissection was carried down to bone.  The drill sleeve was advanced to the bone and a drill was used to make the  hole for the distal screw.  It measured a 42-1/2 mm and we passed a 42-1/2  mm screw.  Radiographs were taken to confirm reduction and screw and implant  position.  This was satisfactory.  The wounds were irrigated and closed with  0 Monocryl, 2-0 Monocryl.  We injected a total of 30 mL of Sensorcaine deep  into the insertion site proximally and then closed the skin with staples.  We applied an occlusive dressing.   We took the patient to the recovery room in stable condition.  Of note, the  patient has shortening of the left lower extremity which is confirmed to be  preoperative by the patient's family.  I have informed them that she may  need a left.   I have also informed them that I will be out of town.  Dr. Margo Aye of  cardiology will be handling her medical conditions.  Dr. Hilda Lias can answer  any orthopedic questions.  I have  shown them pictures of their fracture and  fixation of the fracture and fixation and they seem satisfied with the  results.      Vickki Hearing, M.D.  Electronically Signed     SEH/MEDQ  D:  05/22/2006  T:  05/23/2006  Job:  [id number]

## 2010-12-28 NOTE — Procedures (Signed)
NAME:  [name], [name]                  ACCOUNT NO.:  [id number]   MEDICAL RECORD NO.:  [id number]          PATIENT TYPE:  INP   LOCATION:  IC04                          FACILITY:  [facility]   PHYSICIAN:  Edward L. Juanetta Gosling, M.D.DATE OF BIRTH:  [birth year]   DATE OF PROCEDURE:  04/07/2005  DATE OF DISCHARGE:                                EKG INTERPRETATION   TIME:  0536 hours  DATE:  April 07, 2005   The rhythm is atrial fibrillation with a ventricular response in the 140s.  There is probable left ventricular hypertrophy. There are ST-T wave  abnormalities which may indicate ischemia versus injury.      Edward L. Juanetta Gosling, M.D.  Electronically Signed     ELH/MEDQ  D:  04/07/2005  T:  04/07/2005  Job:  [id number]

## 2010-12-28 NOTE — H&P (Signed)
NAME:  [name], [name]                  ACCOUNT NO.:  [id number]   MEDICAL RECORD NO.:  [id number]          PATIENT TYPE:  INP   LOCATION:  A208                          FACILITY:  [facility]   PHYSICIAN:  Margaretmary Dys, M.D.DATE OF BIRTH:  04-27-1921   DATE OF ADMISSION:  04/04/2005  DATE OF DISCHARGE:  LH                                HISTORY & PHYSICAL   PRIMARY CARE PHYSICIAN:  Dr. Leona Carry.   ADMITTING DIAGNOSES:  1.  Acute abdominal pain.  2.  Probable acute cholecystitis.  3.  Poorly controlled hypertension.  4.  Poorly controlled diabetes mellitus, type 2.  5.  Extensive history of coronary artery disease, status post stenting.  6.?Atypical chest pain r/o CAD   CHIEF COMPLAINT:  Right upper quadrant abdominal pain and back pain of two  days' duration.   HISTORY OF PRESENT ILLNESS:  [name] is an 77 year old Caucasian female  who presented to the emergency room complaining of pain which initially  started between her shoulder blades.  Shortly after that, she noticed the  pain in her right upper quadrant.  She describes the pain as sharp,  continuous and also radiating to her back.  The intensity of the pain was  about 9/10 at its worst.  She took some aspirin pills at the onset of the  pain with some improvement, but the pain subsequently returned.  She also  has some nausea and vomiting.   Last night, prior to coming to the hospital, she says she had a fever with  chills but did not check her temperature.  She says she is chronically  constipated, and she did not have a bowel movement that was any different in  the last 48 hours.   She has no headaches, dizziness or lightheadedness.  She denies any  shortness of breath.  She denies any chest pain concerning for angina.  She  says she had no chest pressure, and her symptoms of pain in her chest.   She denies any frequency, urgency or dysuria.  Denies any change in her skin  color or eye color.  She thinks her urine is  a little more yellow than  usual.   Initially evaluation in the emergency room revealed a leukocytosis, a CT  scan of the abdomen was very suspicious, although not conclusive, for acute  cholecystitis.  The patient has been admitted now for further evaluation and  management.   REVIEW OF SYSTEMS:  Ten-point review of systems is as mentioned in the  history of present illness.   PAST MEDICAL HISTORY:  1.  Hypertension.  2.  Breast cancer, status post left mastectomy in 1995.  3.  Dyslipidemia.  4.  Coronary artery disease, status post cardiac catheterization on Dec 27, 2002, revealing an 80% left anterior descending stenosis, a 50% septal      perforator stenosis and a 50% ostial RCA stenosis with an ejection      fraction of 65%.  The patient underwent a stent placement to the left      anterior descending at  the time.  A Taxus stent was used.  An      intravascular ultrasound run following stent placement showed a widely      patent stent with no clear evidence of dissection.  5.  Paroxysmal atrial fibrillation.  6.  Diabetes mellitus, type 2.   PAST SURGICAL HISTORY:  Hysterectomy.  Mastectomy   MEDICATIONS:  1.  Plavix 75 mg p.o. daily.  2.  Clonidine 0.3 mg p.o. b.i.d.  3.  Hydrochlorothiazide 25 mg p.o. daily.  4.  Altace 10 mg p.o. daily.  5.  Glipizide ER 10 mg p.o. daily.  6.  Zocor 40 mg p.o. q.h.s.  7.  Aspirin 325 mg p.o. daily.  8.  Fosamax 70 mg p.o. once a week.  9.  Vitamin E 400 international units daily.  10. Calcium with vitamin D 600 mg b.i.d.   ALLERGIES:  She has no known drug allergies.   SOCIAL HISTORY:  She is a widow.  She lives by herself.  She is very  independent of activities of daily living.  She continues to go to the  grocery store and drive.  She is a lifelong nonsmoker.  She does not drink  alcohol.  Her husband died in 29-Jan-2005from Alzheimer's dementia.  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   FAMILY HISTORY:  Noncontributory.   PHYSICAL EXAMINATION:  GENERAL APPEARANCE:  A conscious, alert, comfortable,  pleasant lady not in acute distress.  VITAL SIGNS:  Blood pressure on arrival 150/61, pulse 54, respiratory rate  16, temperature 98.4 degrees Fahrenheit.  Oxygen saturation was 97% on room  air.  Pain at this time was noted to be 6/10.  HEENT:  Normocephalic, atraumatic.  Her oral mucosa was moist with no  exudates noted.  NECK:  Supple.  No JVD.  No lymphadenopathy.  LUNGS:  Clear clinically with good air entry bilaterally.  No crackles or  wheezing was heard.  HEART:  S1, S2 regular.  No S3, S4, gallops or rubs.  The patient had a soft  systolic murmur, about 2/6.  ABDOMEN:  Soft.  It was not distended.  There was no guarding.  The patient  had some right upper quadrant tenderness.  Murphy's sign was equivocal.  Bowel sounds were positive.No mass palpable  EXTREMITIES:  Bilateral trace pitting pedal edema, mostly around the ankles.  There was no calf induration or tenderness noted.  CENTRAL NERVOUS SYSTEM:  Grossly intact with no focal deficits.  The patient  was oriented in time, place and person and very lucid.   LABORATORY/DIAGNOSTIC STUDIES:  A white blood cell count was 15.9,  hemoglobin 11.6, hematocrit 33.3, platelet count 295 with a left shift.  Sodium was 139, potassium 3.6, chloride 105, CO2 28, glucose 215, BUN 21,  creatinine 0.9.  AST is 60, ALT 37, total protein 6, albumin 3.4.  Calcium  is 8.6.  Lipase is 47.  Cardiac enzymes x 2 were negative.   A CT scan of the abdomen and pelvis revealed gallstones and mild gallbladder  wall thickening.  She had no pericholecystic inflammation, but a definite  diagnosis of acute cholecystitis could not be excluded.  She also had a  large hiatal hernia and evidence of reflux.  She also had a 2.7 x 1.7-cm left ovarian cyst.  There was also the possibility of left sacroiliitis.   A 12-lead EKG revealed a  normal sinus rhythm with poor R wave progression  and  less than 1 mm of ST depression in V4-V5.  There was no significant  change when compared to a previous EKG.   ASSESSMENT/PLAN:  [name] is an 77 year old Caucasian female presenting  with abdominal pain, mostly in the right upper quadrant, radiating to her  back.  She does have an extensive history of coronary artery disease and  multiple medical problems.  1.  Acute abdominal pain.  The patient is  at risk for acute cholecystitis.      Her CT scan was not diagnostic; however, an ultrasound was recommended.      We will proceed with obtaining an ultrasound later this morning and will      keep her n.p.o. for now.  She does, however, have leukocytosis and      multiple gallstones with some mild gallbladder wall thickening on her      CAT scan.  I will empirically treat her for acute cholecystitis at this      time with iv Unasyn 3gm q6h.  She does not have radiologic evidence of      common bile duct obstruction but has mildly elevated LFT.  I will hold      her Plavix and aspirin in case she will need surgery, but,      unfortunately, we may have to wait a little bit for her Plavix to clear      out from her system before going ahead with surgery.  I also think the      patient will need cardiology clearance prior to surgery due to her      extensive cardiac history from only two years ago.  2.  Possibly coronary artery disease.  Her pain is not typical for angina,      and it is not located in her chest wall.  However, the patient is      diabetic, and she is 77 years old.  It is possible that her pain may be      atypical and still have CAD.  I will admit her to 2-A for now until she      sees cardiology and we obtain another set of cardiac enzymes.  She can      be transferred off telemetry to 3-A once her cardiology clearance is      obtained.  3.  Poorly controlled hypertension.  We will resume all her previous       medications.  A beta blocker would have been very beneficial for her,      but a prior entry in her record back in 2004 indicated that this was      discontinued, possibly due to bradycardia.  The patient is also      borderline bradycardic  today.  We will continue her      hydrochlorothiazide, clonidine and Altace at this time.  4.  Coronary artery disease.  As mentioned above, this may be a concern, but      we will hold her Plavix and aspirin until we have an ultrasound and      further evaluation by cardiology/surgery.  5.  Diabetes mellitus, type 2.  This will need aggressive control      perioperatively.  I will put her on sliding-scale insulin for now and      may transition her to a continuous infusion depending on what the plan      is, if she is going to proceed with surgery.  I will hold her  oral      hypoglycemic agents at this time.  She will be on a moderately sensitive     insulin scale.   Pain control will be with morphine.  DVT prophylaxis will be with a  sequential compression device for now.  GI prophylaxis with Protonix.   I have discussed the above plan with the patient, who verbalized full  understanding.   CODE STATUS:  She is a full code.      Margaretmary Dys, M.D.  Electronically Signed     AM/MEDQ  D:  04/04/2005  T:  04/04/2005  Job:  [id number]   cc:   [place] Bing, M.D. [facility]  [address]  [address]  [place]  [place] [address]   Bernerd Limbo. Leona Carry, M.D.  [address]    [place] [address]  Fax: [phone]

## 2010-12-28 NOTE — Discharge Summary (Signed)
[name], [name]                  ACCOUNT NO.:  [id number]   MEDICAL RECORD NO.:  [id number]          PATIENT TYPE:  INP   LOCATION:  A222                          FACILITY:  [facility]   PHYSICIAN:  [name], M.D.        DATE OF BIRTH:  1920-08-22   DATE OF ADMISSION:  11/20/2006  DATE OF DISCHARGE:  LH                               DISCHARGE SUMMARY   DISCHARGE DIAGNOSES:  1. Congestive heart failure, unknown type, likely diastolic given      previous 2-D echocardiogram.  2. Chronic atrial fibrillation with rapid ventricular response  3. Lower extremity edema.  4. Hypothyroidism  5. Diabetes mellitus type 2.  6. Hypertension.  7. Chronic anticoagulation with Coumadin.  8. Hyperlipidemia.  9. Mitral regurgitation and mitral valve prolapse.   DISCHARGE MEDICATIONS:  1. Coumadin 2.5 mg p.o. daily as previously taken.  2. Metoprolol 50 mg p.o. b.i.d.  3. Diovan 160 mg p.o. daily.  4. Crestor 10 mg p.o. daily.  5. K-Dur 20 mEq p.o. t.i.d.  6. Clonidine 0.3 mg p.o. b.i.d.  7. Glipizide 10 mg p.o. daily.  8. Synthroid 50 mcg p.o. daily.  9. Cardizem 30 mg p.o. t.i.d.  10.Lasix 40 mg t.i.d. or two in the morning and one at noon.  11.Darvocet-N 100 q.6h. p.r.n. for pain.  12.Evista 60 mg p.o. daily.   BRIEF HOSPITAL COURSE:  [name] is an 77 year old white female who  presented with chronic atrial fibrillation with initial RVR and acute  signs of a congestive heart failure.  She has had considerable edema in  her lungs as well as chronic-type cough felt secondary to heart failure.  Apparently she had acute decompensation and came to the emergency room  for further assessment.  She was seen and assessed by Dr. Dietrich Pates and  had diuresed well throughout hospitalization and now ready for  discharge.   LABORATORY WORK OBTAINED DURING HOSPITALIZATION:  Initial CBC showed  white count of 6.5, hemoglobin 11.7, platelet count of 289. Initial BNP  was elevated at 372, followup BNP today  on April 14 is 247.  D-dimer was  elevated at 0.99.  TSH was 1.49.  Cardiac panels x3 were all negative.  BMET at time of discharge shows sodium 138, potassium of 3.4, chloride  of 104, CO2 of 28, glucose of 146, BUN 13, creatinine 0.69, GFR greater  than 60, calcium of 8.0.  PT shows her protime is 25.9, INR of 2.2.   IMAGES OBTAINED:  Initial chest x-ray showed a degree of pulmonary  edema.  Repeat on April 12 showed improving interstitial edema or  infiltrates and improved aeration of the right lung base, continues to  show some worsening left lower lung consolidation versus atelectasis.   OBJECTIVE:  VITALS AT TIME OF DISCHARGE:  Temperature is 98.4 blood  pressure is 145/86, pulse 92, respirations 18, CBG is 143, O2 saturation  is 95% on room air.  Weight is 63.6 kg.  She has diuresed approximately  9 L since being at the hospital.  GENERAL:  This is an elderly white  female sitting aside sitting in chair  in no acute distress.  HEENT:  Unremarkable.  No JVD appreciated at this time.  Membranes are  moist.  LUNGS:  Clear to auscultation bilaterally.  No crackles appreciated.  HEART:  Irregularly irregular, faint 2/6 systolic murmur over left upper  sternal border.  ABDOMEN:  Soft, nontender, positive bowel sounds.  EXTREMITIES:  No lower extremity.  Does have some mild pain to palpation  in the calf area bilaterally.  Palpable pulses in all extremities.  NEUROLOGIC:  Grossly intact.  No deficits appreciated.   HOSPITAL COURSE PER PROBLEM:  #1 - CONGESTIVE HEART FAILURE.  Adjustments were made to her medicines and she was stopped on the  diltiazem and changed to Cardizem 30 mg three times a day.  She has  remained in slow rate since admission and continued on the metoprolol as  well.  She did have an increase in her Lasix.  The patient was very  fearful of taking Lasix due to problems with getting up using the  bathroom all the time, but emphasized to the patient that she has to   take this or else she will continue to hold onto fluid.  She did have a  2-D echocardiogram done and results available at this time show some  worsening problems with mitral regurgitation and mitral valve prolapse  but no specific recommendations made by Dr. Dietrich Pates and will be  followed by closely by Dr. Dietrich Pates for this.   #2 - ATRIAL FIBRILLATION WITH RAPID VENTRICULAR RESPONSE.  Continued  rate control with metoprolol and Cardizem, has not had any further  problems with this.   #3 - DIABETES MELLITUS TYPE 2.  Will continue on the glipizide.  Her  metformin was held secondary to her congestive heart failure and believe  will have to avoid this from here on out.   #4 - HYPERTENSION.  She appears to be doing well on her current regimen.  Continue the Diovan and Cardizem.   #5 - HYPOTHYROIDISM.  TSH was within normal range.  Continue on the  current dose of Synthroid at 50 mcg daily.   #6 - GENERALIZED DEBILITY AND WEAKNESS.  She was seen by physical  therapy and felt she appeared to be at her baseline.  Continue with her  walker as previously prescribed.   DISPOSITION:  The patient will be discharged to home with prescription  for Lasix, Cardizem, and Darvocet for lower extremity pain on p.r.n.  basis.  She needs to monitor daily weights and if she has a 3- to 4-  pound weight gain she needs to call a doctor, or if she has increased  swelling or increased shortness of breath she needs to call the  doctor for further assessment.  She is to follow up with Dr. Dietrich Pates in  two weeks and follow up with me in approximately two-and-a-half weeks  and will recheck a BMET at that time to see about her potassium level.  She is getting her Coumadin monitored routinely by [employer].      [name], M.D.  Electronically Signed     ZH/MEDQ  D:  11/24/2006  T:  11/24/2006  Job:  [id number]   cc:   Gerrit Friends. Dietrich Pates, MD, [facility] [address]  [place], [place] [address]

## 2010-12-28 NOTE — Group Therapy Note (Signed)
NAMEKERITH, SHERLEY                  ACCOUNT NO.:  [id number]   MEDICAL RECORD NO.:  [id number]          PATIENT TYPE:  INP   LOCATION:  A203                          FACILITY:  [facility]   PHYSICIAN:  [name], M.D.        DATE OF BIRTH:  Apr 05, 1921   DATE OF PROCEDURE:  05/21/2006  DATE OF DISCHARGE:                                   PROGRESS NOTE   SUBJECTIVE:  Ms. Sanluis is an 77 year old white female with multiple medical  problems who had a fall yesterday resulting in a comminuted left femur  intertrochanteric fracture.  Has been seen by Dr. Romeo Apple, but given her  elevated PT/INR due to chronic Coumadin surgery is postponed again until  tomorrow and reassessment.  The patient states that the pain is relatively  under good control when she does not move this left leg, but when given pain  medicine it does help.  She denies any more of the upper abdominal pain as  she was having in the emergency department.   OBJECTIVE:  VITAL SIGNS:  Temperature is 98.6, blood pressure is ranging  from 138/81 to 169/86, pulse is between 81 and 102.  CBG 146 and 195  respectively.  GENERAL:  This is an elderly white female lying in bed in no acute distress.  HEENT:  Unremarkable.  Oropharynx is moist mucous membranes.  NECK:  Supple.  No JVD.  LUNGS:  Good air movement throughout.  No crackles or wheezing.  HEART:  Irregularly irregular.  Slightly increased in rate from yesterday.  Continues to have a 2/6 systolic murmur.  ABDOMEN:  Soft, nontender, nondistended.  Positive bowel sounds.  EXTREMITIES:  Trace lower extremity edema.  Multiple varicose veins.  Does  have left leg in mild traction.  NEUROLOGIC:  Grossly intact.  No deficits.  Alert and oriented x3.   LABORATORY DATA:  Reveals CBC showing white count of 11.0, hemoglobin 12.2,  B-met shows sodium 139, potassium 3.7, chloride 107, CO2 26, glucose 250,  BUN 26, creatinine 1.0, calcium 8.4, PT 27.9, INR 2.5.  Cardiac panel has  been  negative x2. A repeat PT this morning at 9:00 a.m. showed PT of 27.1,  INR of 2.4.   IMPRESSION:  Ms. Puryear is an 77 year old white female with left femur and  hip fracture awaiting surgery by Dr. Romeo Apple with multiple other medical  problems which will be followed closely.   ASSESSMENT/PLAN:  1. Left hip fracture.  Once PT/INR is decreased with vitamin K that was      given this morning dosage by Dr. Dietrich Pates may be amenable to surgery at      that time.  We will follow along.  2. Upper abdominal pain.  Has not had any further problems likely      secondary to some reaction to morphine.  3. Uncontrolled hypertension.  Continue with multiple medicines and      monitor throughout the hospitalization.  4. Diabetes mellitus type 2.  Continue with sliding scale insulin since      her diet is changing periodically  and I do not want any of these      medicines on board when she has surgery for risk of hypoglycemia.  5. Hypothyroidism.  TSH and free T4 are still pending.  Continue same      Synthroid dosage.  6. Hyperlipidemia.  Continue Crestor.  7. Paroxysmal atrial fibrillation on chronic Coumadin.  She will be      stopped on Coumadin.  We will try to figure      out whether the patient needs to be started on heparin given the fact      she is in AFib routinely.  We will try to discuss with cardiology to      continue.  She still is therapeutic on Coumadin at this time and does      not require heparinization now.      [name], M.D.  Electronically Signed     ZH/MEDQ  D:  05/21/2006  T:  05/21/2006  Job:  [id number]

## 2010-12-28 NOTE — Group Therapy Note (Signed)
NAME:  [name], [name]                  ACCOUNT NO.:  [id number]   MEDICAL RECORD NO.:  [id number]          PATIENT TYPE:  INP   LOCATION:  A220                          FACILITY:  [facility]   PHYSICIAN:  [name], M.D.        DATE OF BIRTH:  April 07, 1921   DATE OF PROCEDURE:  05/27/2006  DATE OF DISCHARGE:                                   PROGRESS NOTE   SUBJECTIVE:  [name] is an 77 year old white female with a recent fracture  of the left hip and femur post-op day #5 from fixation by Dr. Romeo Apple.  She  continues to have some hip pain with movement but per PT is doing very well.  She has been seen and assessed by Dr. Dietrich Pates as well for her chronic AFib  and titrating back up her Coumadin level to therapeutic range.  She denies  any chest pains or shortness of breath, eating and drinking well.   OBJECTIVE:  VITAL SIGNS:  Temperature is 98.2, blood pressure ranging  between 100/44 up to 135/84, pulse is 76 to 112.  CBG has been 121, 233,  197, respectively.  Sating 92 to 94% on room air.  GENERAL:  This is an elderly white female lying in bed in no acute distress.  HEENT:  Unremarkable.  Mucous membranes moist.  NECK:  Supple.  No JVD.  LUNGS:  Good air movement throughout.  No crackles or wheezing.  HEART:  Irregular but slow rate.  A 2/6 systolic murmur.  ABDOMEN:  Soft, nontender, nondistended.  Positive bowel sounds.  EXTREMITIES:  No lower extremity edema.  Lower extremities show chronic  varicosities lower extremities bilaterally and left hip shows normal staple  placement from surgery with minimal erythema.  NEUROLOGIC:  Grossly intact.  No deficit.  Alert and oriented  x3.   LABORATORY DATA:  B-MET today shows a sodium of 137, potassium 3.4, chloride  105, CO2 27, glucose 109.  BUN 12, creatinine 0.5, calcium of 7.8.   IMPRESSION:  This is an 77 year old white female with left femur hip  fracture, postoperative day five from fixation by Dr. Romeo Apple, getting  routine physical  therapy.   ASSESSMENT/PLAN:  1. Left hip fracture, doing well.  Continue with physical therapy.  Will      likely need a rehabilitation facility following this.  2. Uncontrolled hypertension with much better control at this time with      adjustment change in her medicines.  3. Diabetes mellitus, type 2.  We will resume her oral hypoglycemic      medicines at this time and continue to monitor.  We will decrease her      Lantus dose in half.  4. Hypothyroidism.  Did have TSH, T4 check, continue current dosage.  5. History of paroxysmal atrial fibrillation but is routinely in atrial      fibrillation at this time, still titrating up her Coumadin dose.  We      will recheck her PT INR in the morning.  6. Mild hypokalemia.  She started on a daily dose of  potassium.  Since she      continues to have hypokalemia, we will check a magnesium level in the      morning to see if it is low and if this is the problem that repletion      is not adequately keeping her potassium level up.   DISPOSITION:  The patient is awaiting short-term nursing facility placement  with further rehab.  We will defer decision to transfer to a facility to Dr.  Romeo Apple.  From medical standpoint, she appears to be doing well and only  thing is titrating up her Coumadin dose to a therapeutic range.      [name], M.D.  Electronically Signed     ZH/MEDQ  D:  05/27/2006  T:  05/27/2006  Job:  [id number]

## 2010-12-28 NOTE — Group Therapy Note (Signed)
   NAME:  [name], [name]                            ACCOUNT NO.:  [id number]   MEDICAL RECORD NO.:  [id number]                   PATIENT TYPE:  EMS   LOCATION:  ED                                   FACILITY:  [facility]   PHYSICIAN:  Edward L. Juanetta Gosling, M.D.             DATE OF BIRTH:  [birth year]   DATE OF PROCEDURE:  DATE OF DISCHARGE:  01/08/2003                                   PROGRESS NOTE   TIME SEEN:  7:23.   SUBJECTIVE:  The rhythm was a sinus rhythm with a bradycardic rate at 40.  There were minimal ST-T-wave changes laterally.  Abnormal electrocardiogram.                                                Oneal Deputy. Juanetta Gosling, M.D.    ELH/MEDQ  D:  01/19/2003  T:  01/20/2003  Job:  [id number]

## 2010-12-28 NOTE — Procedures (Signed)
   NAME:  MAIZEE, REINHOLD NO.:  [id number]   MEDICAL RECORD NO.:  [id number]                   PATIENT TYPE:  OUT   LOCATION:  RAD                                  FACILITY:  [facility]   PHYSICIAN:  Vida Roller, M.D.                DATE OF BIRTH:  07-29-1921   DATE OF PROCEDURE:  12/17/2002  DATE OF DISCHARGE:                                    STRESS TEST   ADENOSINE CARDIOLITE   INDICATION:  The patient is a 77 year old female with history of  nonobstructive coronary artery disease by catheterization in 2001 with  medical management since that time.  Her catheterization at that time showed  LAD stenosis of 30-40% in the mid section, left circumflex with marginal 30%  stenosis, RCA with ostial 50-60% stenosis and 40% distal stenosis, ejection  fraction greater than 70% at that time.  The patient does have history of  paroxysmal atrial fibrillation and was recently seen in the emergency  department at [facility] for palpitations and jaw pain.  She was  found to be in atrial fibrillation with rapid ventricular rate and  significant ST depression was noted on her EKG at that time.  The patient  refused admission at that time.   BASELINE DATA:  EKG shows sinus rhythm at 59 beats per minute with inverted  T's in aVL.  Blood pressure 128/78.   DESCRIPTION OF PROCEDURE:  Adenosine 38 mg was infused over four minutes  with Cardiolite injected at three minutes.  The patient reported a pain in  her lower teeth, flushing, and mild shortness of breath - all of which  resolved in recovery.  Her EKG showed significant ST depression and T wave  inversion in leads I, II, III, aVF, V4-V6 with adenosine infusion.  This  resolved at approximately two minutes into recovery.   Final images and results are pending M.D. review.     Amy Mercy Riding, P.A. [facility]                     Vida Roller, M.D.    AB/MEDQ  D:  12/17/2002  T:  12/17/2002  Job:  [id number]

## 2010-12-28 NOTE — Procedures (Signed)
   NAME:  [name], [name]                            ACCOUNT NO.:  [id number]   MEDICAL RECORD NO.:  [id number]                   PATIENT TYPE:  EMS   LOCATION:  ED                                   FACILITY:  [facility]   PHYSICIAN:  Edward L. Juanetta Gosling, M.D.             DATE OF BIRTH:  [birth year]   DATE OF PROCEDURE:  11/21/2002  DATE OF DISCHARGE:  11/21/2002                                EKG INTERPRETATION   FINDINGS:  The rhythm is what appears to be atrial fibrillation with a  ventricular response in the low 50s.  There are multi ST-T wave changes  which could be due to ischemia and clinical correlation is suggested.   IMPRESSION:  Abnormal electrocardiogram.                                               Edward L. Juanetta Gosling, M.D.    ELH/MEDQ  D:  11/22/2002  T:  11/22/2002  Job:  [id number]

## 2010-12-28 NOTE — Consult Note (Signed)
NAMESHARMAIN, [name] NO.:  [id number]   MEDICAL RECORD NO.:  [id number]                   PATIENT TYPE:  EMS   LOCATION:  ED                                   FACILITY:  [facility]   PHYSICIAN:  Sarita Bottom, M.D.                  DATE OF BIRTH:  17-Jan-1921   DATE OF CONSULTATION:  11/21/2002  DATE OF DISCHARGE:                                   CONSULTATION   PRIMARY CARE PHYSICIAN:  Bernerd Limbo. Destefano, M.D.   CHIEF COMPLAINT:  I have palpitations and pain in my cheek.   HISTORY OF PRESENT ILLNESS:  The patient is a 77 year old lady with a  history of hypertension, paroxysmal atrial fibrillation.  She was well until  about 12 a.m. this night when she noted the sudden onset of palpitations.  She denied any shortness of breath or dizziness at this time.  She said that  she had a similar episode about a year ago for which she was seen by a  cardiologist.  She called her son who therefore brought her to the emergency  room for treatment and evaluation.   On arrival in the emergency room, the patient was noted to be in rapid  atrial fibrillation, and she was subsequently treated with IV Cardizem,  after which her heart rate reduced into the 100's.  When I saw the patient,  she denies any shortness of breath now.  She has no chest pain, no  palpitations at the moment.   REVIEW OF SYMPTOMS:  GENERAL:  In general, she denies any weakness or fever.  RESPIRATORY:  Denies any cough.  She denies any shortness of breath.  CARDIOVASCULAR:  She denies any chest pain.  She had palpitations earlier  which have subsided.  MUSCULOSKELETAL:  She denies any pain in her knees or  back.  CNS:  She denies any dizziness at the moment.  EXTREMITIES:  She  denies any pedal edema.   PAST MEDICAL HISTORY:  1. History of coronary artery disease.  2. History of hypertension.  3. She has been told that she has a rapid irregular heart rate, which     probably is paroxysmal atrial  fibrillation.  4. She had a cardiac catheterization done in 2001, which showed a two vessel     moderate disease.   MEDICATIONS:  1. Cardizem.  2. Hydrochlorothiazide.  3. Clonidine.  4. Altace.   ALLERGIES:  No known drug allergies.   FAMILY HISTORY:  Her mother died of a heart attack.   SOCIAL HISTORY:  She lives with her husband who has Alzheimer's disease.  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  She does not  smoke or drink alcohol.   PHYSICAL EXAMINATION:  VITAL SIGNS:  Blood pressure 124/71, heart rate 57,  temperature 98.4.  GENERAL:  She is an elderly lady, in no  acute distress.  HEENT:  She is not pale, she is anicteric.  Pupils equal, round, reactive to  light and accommodation.  NECK:  Supple.  There is no jugular venous  distention, no carotid bruit.  CHEST:  Air entry is reduced bibasilarly.  She has no wheezes.  She has no crackles.  CARDIOVASCULAR:  Heart sounds one  and two are normal.  Rhythm appears regular, slightly bradycardic.  She has  no murmurs.  ABDOMEN:  Soft, bowel sounds are present.  No tenderness on  deep palpation.  CNS:  Alert and oriented x3.  She has no focal deficits.  EXTREMITIES:  She has no edema, pulses are 2+ bilaterally.   LABORATORY DATA:  An EKG done on 6/18, shows a sinus rhythm at 52 beats per  minute.  She has a leftward axis.  She has left ventricular hypertrophy  __________ pattern, no acute ST or T-wave changes.  Her cardiac troponin-I  is 0.03.  CK is 142, MB fraction is 7.  Sodium is 134, potassium 3.1, BUN  17, creatinine 0.6, glucose 220.  White blood cell count 13.3, hematocrit  41.8, MCV 86, platelet count 306.  Her chest x-ray shows cardiomegaly with  pulmonary and vascular congestion and no acute infiltrates.   ASSESSMENT:  1. Paroxysmal atrial fibrillation.  2. Congestive heart failure.  3. Hypokalemia.  4. Hypertension.  5. Question diabetes mellitus.   PLAN:  The plan is to admit the patient  to the floor for monitoring and  management of her paroxysmal atrial fibrillation and congestive heart  failure.  The patient, however, refuses to stay.  She has been counseled and  given time to think about her decision about the risks and benefits of  signing out.  The patient still wants to sign out and go home and take care  of her husband.  The patient will therefore be discharged home to continue  with preadmission medications which include Clonidine, Cardizem,  hydrochlorothiazide, and Altace.  The patient's potassium will also be  corrected with K-Dur 40 mEq stat.  The patient is advised to see primary  care physician at her earliest convenience.   CONDITION ON DISCHARGE:  Stable.                                               Sarita Bottom, M.D.    DW/MEDQ  D:  11/21/2002  T:  11/21/2002  Job:  [id number]

## 2010-12-28 NOTE — Group Therapy Note (Signed)
NAMECAMBREIGH, DEARING                  ACCOUNT NO.:  [id number]   MEDICAL RECORD NO.:  [id number]          PATIENT TYPE:  INP   LOCATION:  IC04                          FACILITY:  [facility]   PHYSICIAN:  [name], M.D.        DATE OF BIRTH:  August 02, 1921   DATE OF PROCEDURE:  05/25/2006  DATE OF DISCHARGE:                                   PROGRESS NOTE   [name] is an 77 year old white female with recent fracture of the lip hip  and femur.  Postop day 3 for fixation by Dr. Romeo Apple.  Continues to have  some left hip pain with movement which is to be expected but has no other  complaints for her chest pain or shortness of breath.  States she is eating  and drinking well, getting up out of bed with assistance with physical  therapy without a problem other than pain.   VITAL SIGNS:  T-max 99.  T-current 98.5.  Blood pressures have been ranging  between 108/50 up to 160/45.  Heart rate is between 72 and 95.  CBGs today  153.  GENERAL:  This elderly white female is in no acute distress.  HEENT:  Unremarkable.  PHARYNX:  Mucous membranes moist.  NECK:  Supple.  No JVD.  LUNGS:  Good air movement throughout.  No crackles or wheezing.  HEART:  Irregularly-regular, slower rate at this time, 2/6 systolic murmur.  ABDOMEN:  Soft, nontender, nondistended.  Positive bowel sounds.  EXTREMITIES:  Trace lower extremity edema.  Left leg wounds show no  erythema.  Staples in place.  NEUROLOGIC:  Grossly intact.  No deficit.  Alert and oriented x3.   LABORATORY DATA:  1. PT 15.  INR 1.2.  2. CBC:  White count 11.7.  Hemoglobin 10.8.  Platelet count 324.  3. BMET:  Sodium 133.  Potassium 3.3.  Chloride 98.  CO2 28.  Glucose 152.      BUN 17.  Creatinine 0.7.  Calcium 7.5.   IMPRESSION:  This is an 77 year old white female with left femur and hip  fracture, postop date #3 from fixation by Dr. Romeo Apple.   ASSESSMENT AND PLAN:  1. Left hip fracture, appears to be doing well.  Continue with physical  therapy.  2. Uncontrolled hypertension, much improved getting back on her current      regimen.  Is on Catapres patch.  3. Diabetes mellitus, type 2.  Still not at goal post surgery.  Will      increase her Lantus up to 15 units at this time.  She is eating better.      Will eventually resume her oral hypoglycemics as previously prescribed.  4. Hypothyroidism.  TSH and T4 were checked.  Doing well, continue      Synthroid dose.  5. Paroxysmal atrial fibrillation, previously on chronic Coumadin.      Discussed with Dr. Hilda Lias and felt that she could resume her Coumadin      at this time.  Will start back and recheck PT INR routinely.  6. Mild hypokalemia.  Will  replete further today and recheck in the      morning as well as recheck a magnesium level to see if that needs to be      repleted.   DISPOSITION:  The patient will be transferred out of ICU today and continue  with physical therapy and need to get care managed social work to address  short-term nursing facility placement for further rehab related to her hip  fracture.      [name], M.D.  Electronically Signed     ZH/MEDQ  D:  05/25/2006  T:  05/25/2006  Job:  [id number]

## 2010-12-28 NOTE — H&P (Signed)
[name], [name]                  ACCOUNT NO.:  [id number]   MEDICAL RECORD NO.:  [id number]          PATIENT TYPE:  INP   LOCATION:  A203                          FACILITY:  [facility]   PHYSICIAN:  Vickki Hearing, M.D.DATE OF BIRTH:  [birth year]   DATE OF ADMISSION:  05/20/2006  DATE OF DISCHARGE:  LH                                HISTORY & PHYSICAL   CHIEF COMPLAINT:  Left hip pain.   HISTORY:  This is an 77 year old female who was injured on the day of  admission, May 20, 2006.  She went to church and got out of the car. She  walked across the parking lot but became woozy, lost her balance and fell.  She initially ambulated with the cane across the parking lot.  Once she was  inside, she stopped using the cane and lost her balance and fell.  She  denied losing consciousness.  Just said she felt a little woozy.  She  injured her left hip.  She could not get up.  She had left lower extremity  deformity and was brought to the hospital by EMS.  She complains of severe  left hip pain and upon my evaluation of her in the emergency room,  complained of chest pain.   REVIEW OF SYSTEMS:  She says that she has been feeling well up until today.  She has chronic left upper quadrant pain which comes and goes.  In fact, it  is unclear whether her chest pain today is GI-related or cardiac-related.  She initially thought it felt like gas, but then said that she was concerned  that it might be her heart.  Other system reviews are as stated in the  emergency room MD record.  In summation, the constitutional signs were  negative.  EARS/NOSE/THROAT/ORAL:  She has several cavities.  She sees a  Education officer, community regularly.  She indicates that he said none of them should be  pulled.  She has some bridge work on the superior denture line.  CARDIOVASCULAR:  She reports peripheral edema, especially the left side.  She initially had no chest pain but after morphine became somewhat  diaphoretic and had some  upper left quadrant, versus left chest wall  discomfort.  RESPIRATORY:  Negative for dyspnea.  GI:  As stated.  GENITOURINARY:  Negative.  MUSCULOSKELETAL:  A history of right-sided back  pain, worse in the last week or two, radiating from the right to the left  and sometimes into the right leg.  SKIN:  Negative.  NEUROLOGIC:  Negative.  PSYCH:  Negative.  METABOLIC:  Negative.  HEMATOLOGIC:  Negative.  ALLERGY:  Negative.   PAST MEDICAL/SURGICAL HISTORY:  1. Atrial fibrillation.  2. Status post left mastectomy.  3. She has a stent placed in the coronary, question which artery.   SOCIAL HISTORY:  No smoking, drinking or drug abuse.   ALLERGIES:  No known drug allergies.   HOME MEDICATIONS ON ADMISSION:  1. Coumadin.  2. Synthroid.  3. Evista.  4. Diltiazem.  5. Metformin.  6. Potassium.  7. Glipizide.  8. Torsemide.  9. Clonidine.  10.Crestor.  11.Minoxidil.  12.Diovan.  13.Metoprolol.  I do not know the dosages yet.   PHYSICAL EXAMINATION:  VITAL SIGNS:  Even with the chest pain her heart rate  was irregular in the low 60's to upper 50's, respiratory rate 20, blood  pressure 130/70, temperature 98.4 degrees.  Pulse oximetry was 94% on room  air.  We have an estimated weight of approximately 150 to 160 pounds.  GENERAL:  The patient appeared to be without any deformity or gross lesions,  well-developed and well-nourished.  Grooming and hygiene were normal.  Again, she was just coming from church.  She seemed to be in good spirits.  HEENT:  No trauma, but oral cavity with denture abnormalities.  NECK:  Supple, no masses or lymphadenopathy.  CARDIOVASCULAR:  Rate was normal.  Rhythm was irregular.  Significant  varicosities in the ankle area, foot, distal ankle.  RESPIRATORY:  Per the emergency room record, indicated as normal.  ABDOMEN:  Soft, nontender, non-distended.  Left upper quadrant nontender.  NEUROLOGIC:  She was alert and oriented x3.  Cranial nerves grossly  intact.  Motor exam and muscle tone were normal.  The left leg not tested.  SKIN:  Normal.  EXTREMITIES:  The left lower extremity was held in external rotation with  knee and hip flexion.  Shortening could not be assessed.   RADIOGRAPHS:  Showed a basilar neck/intertrochanteric fracture of the left  hip with comminution.  Lateral film of poor quality.   I was called by Dr. Alvin Critchley.  I called Dr. [name] and asked Dr.  Margo Aye to admit the patient to his service because she had Coumadin issues  which would delay surgery.  She was diabetic, and also because I would be  leaving town, and if I did not get to do the surgery before I left, because  of the Coumadin issues, then it would be better that she would be on his  service.   During my evaluation, again the patient had chest pain, and I asked that Dr.  Margo Aye be re-consulted.  I did speak with Dr. Gerrit Friends. Rothbart, but it was  approximately after 5 p.m., and he indicated that he could not see the  patient in the emergency room and recommended that I call Dr. Margo Aye, which I  did.  Dr. Margo Aye agreed to accept the patient to his service.   LABORATORY DATA:  Hemoglobin 12.7, white count 14, most likely margination,  platelet count 293, absolute neutrophil count 12.4, neutrophils 87%.  Coagulation studies:  Pro-time 28.4, INR 2.5, PTT 31.  Sodium 141, potassium  4, glucose 210, BUN 28, creatinine 1, calcium 8.8.  Cardiac markers which  were ordered once the patient exhibited chest pain, showed a creatine kinase  total of 86, CK-MB of 3, troponin 0.01 and relative index indicates  unreliable with a CK of less than 100 units per liter.  Urinalysis showed  clear urine with a specific gravity of 1.010, pH 5.5, glucose negative,  blood trace positive, negative protein, other nitrites, leukocytes negative.  The patient has been typed and cross matched for 2 units of blood.  She has AB-positive blood with a negative screen, and that expires  on May 23, 2006.   ASSESSMENT:  Diagnosis of left hip fracture.   SECONDARY DIAGNOSIS:  Chest pain.   TERTIARY DIAGNOSIS:  Left upper quadrant pain.   COMORBIDITIES:  1. Atrial fibrillation.  2. Diabetes mellitus.   PLAN:  The patient will be admitted and placed on traction.  Her Coumadin  will be stopped.  Pending the cardiac evaluation, the patient can be put on  Lovenox until the time of surgery.  I can do the surgery once the pro-time  is correct.  We will monitor the diabetes.  Dr. Margo Aye will address her  medication dosages, which we appreciate, and I will continue to see the  patient until either I can do the surgery, or until I have to leave on  Thursday.      Vickki Hearing, M.D.  Electronically Signed     SEH/MEDQ  D:  05/20/2006  T:  05/21/2006  Job:  [id number]

## 2010-12-28 NOTE — Procedures (Signed)
NAMEALYDA, MEGNA                  ACCOUNT NO.:  [id number]   MEDICAL RECORD NO.:  [id number]          PATIENT TYPE:  INP   LOCATION:  IC04                          FACILITY:  [facility]   PHYSICIAN:  [place] Bing, M.D. [facility] OF BIRTH:  May 04, 1921   DATE OF PROCEDURE:  04/09/2005  DATE OF DISCHARGE:                                  ECHOCARDIOGRAM   REFERRING PHYSICIANS:  Dr. Orvan Falconer, Dr. Dietrich Pates.   CLINICAL DATA:  An 77 year old woman with atrial fibrillation and congestive  heart failure.   M-MODE:  Aorta 2.2.  Left atrium 5.2.  Septum 1.7.  Posterior wall 1.4.  LV  diastole 3.9.  LV systole 2.7.   DATA:  1.  A technically adequate echocardiographic study.  2.  Moderate left atrial enlargement.  Normal right atrial size.  Normal      right ventricular size and function.  3.  Mild-to-moderate aortic valvular sclerosis.  4.  Normal mitral valve.  Moderate annular calcification.  5.  Normal pulmonic valve and proximal pulmonary artery.  6.  Normal tricuspid valve.  Physiologic regurgitation.  Estimated right      ventricular systolic pressure at the upper limits of normal to mildly      increased.  7.  Normal left ventricular size.  Mild-to-moderate hypertrophy with      disproportionate involvement of the proximal septum.  Hyperdynamic      regional and global function.  8.  Normal aortic arch and proximal descending aorta.      [place] Bing, M.D. [facility]  Electronically Signed     RR/MEDQ  D:  04/09/2005  T:  04/09/2005  Job:  [id number]

## 2010-12-28 NOTE — Letter (Signed)
November 19, 2006    [name], M.D.  [address]  [place],  [place] [address]   RE:  [name], [name]  MRN:  [id number]  /  DOB:  Nov 21, 1920   Dear Ian Malkin:   It was my pleasure reassessing [name] in the office today at your  request.  As you know, I have followed this nice woman for atrial  fibrillation and mild coronary artery disease.  She has chronic pedal  edema that has been controlled with fairly high doses of diuretics.  Her  principal cardiovascular risk factor is hypertension and diabetes,  treated with oral agents.   She presents with a plethora of symptoms.  She developed a cough  approximately 4 months ago that persisted for a month.  She has had  recurrence in recent weeks.  She has paroxysms of cough, sometimes  productive of mucoid sputum.  Her cough is worse when she lies down.  There has been no fever or chills.   She has episodic spells of weakness.  She notes intermittent  palpitations.  She has exercise intolerance.  She reports urinary  frequency, which prompted a change in her diuretic from torsemide to  Furosemide.  She has insomnia.  She frequently notes fatigue and daytime  somnolence, for which she naps.  She has cold intolerance and abdominal  distension related to excessive gas.   MEDICATIONS:  Extensive and listed in my previous note.  Her dose of  diltiazem has been decreased from 360 to 240 mg per day.  Her diuretics  have been changed as noted.  Her dose of metformin has been increased to  500 mg t.i.d.   EXAM:  Pleasant woman with chronically depressed affect.  The weight is 150, 3 pounds less than in January.  Blood pressure 95/75,  heart rate 100 and irregular, respiration 16.  NECK:  No jugular venous distension; faint left carotid bruit.  LUNGS:  Decreased breath sounds at the bases; otherwise clear.  CARDIAC:  Irregular rhythm; normal first and second heart sounds; modest  systolic ejection murmur.  THORAX:  Moderate kyphosis.  ABDOMEN:  Soft  and nontender; no organomegaly.  EXTREMITIES:  With 1-2+ edema, more prominent on the left.   IMPRESSION:  [name] appears about her baseline to me.  With a change  in her dose of diltiazem, her heart rate is now slightly increased.  In  order to simplify her medications and perhaps decrease edema, we will  try to control her heart rate strictly with metoprolol and discontinue  diltiazem.   Minoxidil, likewise, can contribute to fluid retention and congestive  heart failure.  This medication will be discontinued for the time being.   The change from Demodex to Furosemide may be contributing to increased  edema.  We probably will be able to control fluid with Furosemide,  particularly once minoxidil is discontinued.   A chest x-ray is pending.  A BNP level will also be obtained.  We will  follow blood pressures and clinical symptoms closely via blood pressure  assessments by the cardiology nurses.  I will see this nice woman again  in 3 months.   Her paroxysms of cough probably do not reflect congestive heart failure.  This may represent prolonged sequelae of an initial URI.  Consideration  should be given to difficulties in swallowing or other ENT issues.  Evaluation by an ENT physician and/or speech therapist should be  considered if these symptoms persist.    Sincerely,  Gerrit Friends. Dietrich Pates, MD, [facility]  Electronically Signed    RMR/MedQ  DD: 11/19/2006  DT: 11/19/2006  Job #: [id number]

## 2010-12-28 NOTE — Cardiovascular Report (Signed)
[name], NARANG NO.:  [id number]   MEDICAL RECORD NO.:  [id number]                   PATIENT TYPE:  INP   LOCATION:  6522                                 FACILITY:  [facility]   PHYSICIAN:  Rollene Rotunda, M.D.                DATE OF BIRTH:  October 04, 1920   DATE OF PROCEDURE:  12/27/2002  DATE OF DISCHARGE:  12/28/2002                              CARDIAC CATHETERIZATION   PRIMARY CARE PHYSICIAN:  Dr. Bernerd Limbo. Destefano.   PROCEDURE:  Left heart catheterization/coronary arteriography.   INDICATION:  Evaluate patient with jaw pain and a Cardiolite suggesting  possible anterior wall ischemia.   PROCEDURAL NOTE:  Left heart catheterization was performed via the right  femoral artery.  The artery was cannulated using an anterior wall puncture.  A #6-French arterial sheath was inserted via the modified Seldinger  technique.  Preformed Judkins and a pigtail catheter were utilized.  The  patient tolerated the procedure well and left the lab in stable condition.   RESULTS:   HEMODYNAMICS:  LV 121/20, AO 118/53.   CORONARIES:  The left main was normal.  The LAD had proximal luminal  irregularities.  There was 50% stenosis at a mid-septal perforator.  There  was 70-80% midstenosis with hang-up of dye in this region.  The first  diagonal was small with ostial 80% stenosis.  The second diagonal was small  and normal.  The circumflex in the A-V groove had luminal irregularities.  An OM-1 was moderate to small with ostial 70% stenosis.  An OM-2 was small  and normal.  An OM-3 and OM-4 were both large vessels and normal.  The right  coronary artery was dominant.  There was ostial 50% stenosis.  There was  catheter-induced spasm which dilated after intracoronary nitroglycerin,  although there was fixed obstruction also noted.  This did not appear to be  worse than 2001 at the time of catheterization.  There were diffuse luminal  irregularities and a mid 25%  stenosis.   LEFT VENTRICULOGRAM:  The left ventriculogram was obtained in the RAO  projection.  The EF was 65% with normal wall motion.   CONCLUSION:  High-grade left anterior descending stenosis in the area of  abnormal Cardiolite.    PLAN:  I will review the films with my interventional colleagues for a  probable percutaneous intervention of the LAD.                                                 Rollene Rotunda, M.D.    JH/MEDQ  D:  12/27/2002  T:  12/28/2002  Job:  [id number]   cc:   Bernerd Limbo. Leona Carry, M.D.  [address]  [place]  [place] [address]  Fax: [phone]

## 2010-12-28 NOTE — Discharge Summary (Signed)
[name], [name]                            ACCOUNT NO.:  [id number]   MEDICAL RECORD NO.:  [id number]                   PATIENT TYPE:  INP   LOCATION:  6522                                 FACILITY:  [facility]   PHYSICIAN:  Pricilla Riffle, M.D.                 DATE OF BIRTH:  01/29/1921   DATE OF ADMISSION:  12/24/2002  DATE OF DISCHARGE:  12/28/2002                                 DISCHARGE SUMMARY   DISCHARGE DIAGNOSES:  1. Coronary artery disease.     a. Admission for unstable angina with symptoms of jaw pain.     b. Catheterization this admission revealing left main normal; left        anterior descending proximal luminal irregularities, 50% stenosis at        the septal, a 70%-80% mid stenosis, first diagonal small with ostial        80% stenosis, second diagonal small normal; circumflex--AV groove with        luminal irregularities, first obtuse marginal moderate to small with        ostial 70% stenosis, second obtuse marginal normal, third obtuse        marginal/fourth obtuse marginal large and normal; right coronary        artery ostial 50% stenosis with proximal spasm and diffuse mid luminal        irregularities.  Left ventricular ejection fraction 65% with normal        wall motion.     c. Status post percutaneous transluminal coronary angioplasty and        stenting to the mid left anterior descending with reduction of        stenosis of 80% to 0%.  2. Paroxysmal atrial fibrillation with rapid ventricular rate.     a. The patient in atrial fibrillation upon admission.     b. Conversion to normal sinus rhythm this admission, spontaneously.  3. Hypertension.  4. History of breast cancer.     a. Status post left mastectomy.  5. Status post hysterectomy.  6. Diabetes mellitus type 2.  7. Osteoporosis.  8. Osteoarthritis.  9. Treated hyperlipidemia.   PROCEDURES:  1. Cardiac catheterization by Dr. Rollene Rotunda on Dec 27, 2002.  2. Percutaneous coronary intervention  by Dr. Charlies Constable on Dec 27, 2002.   HOSPITAL COURSE:  Please see the dictated admission history and physical by  Dr. Dietrich Pates on Dec 24, 2002 for complete details.  Briefly, this 77-year-  old female with a history of paroxysmal atrial fibrillation and known mild  CAD was transferred from [facility] with complaints of jaw pain and  palpitations with sudden onset.  She was found to be in atrial fibrillation  with a rapid ventricular rate.  She converted to normal sinus rhythm with  Cardizem IV drip.  She was accepted in transfer from [facility] to  [facility].  She was treated with aspirin, heparin, beta-blockers,  and her usual medications.  She was placed on Zocor 40 mg daily.  She was  enrolled in the Acuity Trial and placed on Angiomax.  She remained stable  through the weekend.  On Dec 27, 2002, she was taken to the cardiac  catheterization lab by Dr. Rollene Rotunda.  She underwent the above-noted  catheterization.  She tolerated the procedure well and had no immediate  complications.  She subsequently went for percutaneous coronary intervention  of the mid LAD by Dr. Charlies Constable.  There was some hang-up of dye at the  proximal end of the stent.  IVUS was performed and showed no tear but a  small area of malapposition at the proximal edge of the stent.  The stent  was redilated.   On the morning of Dec 28, 2002, the patient was found to be in stable  condition.  Her catheterization site was benign.  She had been hypokalemic  on several occasions this admission.  This was repleted at discharge and she  was sent home on K-Dur 20 mEq daily.  Her blood pressure medications were  adjusted somewhat to include Lopressor 50 mg twice daily.  Her Altace was  increased to 5 mg twice daily.  Her HCTZ was decreased to 12.5 mg daily.  She did have a low-grade fever of 100.4 on Dec 27, 2002.  Urinalysis was  checked and this was negative.  Her postcatheterization and  intervention CK-  MB's were negative x2.  Regarding the patient's paroxysmal atrial  fibrillation, her embolic risk factors include age, hypertension, diabetes  mellitus type 2.  The patient is somewhat reluctant to start on long-term  Coumadin therapy.  Dr. Dietrich Pates discussed this with her this admission and  previous office visits.  This will be revisited at her followup appointment.   LABORATORY DATA:  At discharge:  Hemoglobin 12.1, hematocrit 36.6, white  count 10,000, platelet count 236,000.  Sodium 137, potassium 2.2, chloride  106, CO2 25, glucose 200, BUN 14, creatinine 0.6.  CK-MB postintervention  negative x2.  INR on admission was 0.9, calcium 8.1, total bilirubin 0.8,  alkaline phosphatase 68, AST 21, ALT 19, total protein 6.7, albumin 3.7.  Total cholesterol 211, triglycerides 125, HDL 49, LDL 137, TSH 0.853.  Urinalysis as noted above.   Admission chest x-ray:  Mild cardiac enlargement.  No acute pulmonary  findings.   DISCHARGE MEDICATIONS:  1. Aspirin 325 mg daily.  2. Diltiazem ER 120 mg daily.  3. Glucotrol 10 mg daily.  4. Zocor 80 mg 1/2 tablet q.h.s.  5. Catapres 0.3 mg twice daily.  6. Altace 5 mg twice daily.  7. HCTZ 25 mg 1/2 tablet daily.  8. Plavix 75 mg daily.  9. Nitroglycerin p.r.n. chest pain.  10.      Lopressor 50 mg twice daily.  11.      Fosamax.  12.      Vitamin E.  13.      Caltrate D.  14.      Fish oil.  15.      Evista.  16.      K-Dur 20 mEq daily.   ACTIVITY:  No driving, heavy lifting, exertion, or work for three days.  She  is to slowly advance as tolerated.   PAIN MANAGEMENT:  1. Tylenol as needed.  2. Nitroglycerin as needed for chest pain.  She is to call our office or 911  with recurrent chest pain.   DIET:  Low-salt/low-fat diabetic diet.   WOUND CARE:  The patient is to call our office in [place] for any groin  swelling, bleeding, or bruising.  FOLLOW UP:  She will see Dr. Dietrich Pates next week on May 25 at 3:15  p.m. in  [place].  She will have a BMET drawn at that time.  She will need a  fasting lipid panel and LFT's checked in approximately six weeks, as her  Zocor  was initiated this admission.  At that followup, discussions will be held  with the patient about long-term Coumadin therapy for her history of  paroxysmal atrial fibrillation.  She has been advised to follow up with her  primary care physician, Dr. Leona Carry, in 1-2 weeks for followup on diabetes  and hypertension.     Tereso Newcomer, P.A.                        Pricilla Riffle, M.D.    SW/MEDQ  D:  12/28/2002  T:  12/28/2002  Job:  [id number]   cc:   [place] Bing, M.D.   Bernerd Limbo. Leona Carry, M.D.  [address]    [place] [address]  Fax: [phone]

## 2010-12-28 NOTE — H&P (Signed)
[name], [name]                  ACCOUNT NO.:  [id number]   MEDICAL RECORD NO.:  [id number]          PATIENT TYPE:  INP   LOCATION:  A222                          FACILITY:  [facility]   PHYSICIAN:  [name], M.D.        DATE OF BIRTH:  16-Apr-1921   DATE OF ADMISSION:  11/19/2006  DATE OF DISCHARGE:  LH                              HISTORY & PHYSICAL   SUBJECTIVE:  [name] is an 77 year old white female with multiple  medical problems with chronic atrial fibrillation who was admitted with  rapid ventricular response and significant fluid overload from what was  felt to be congestive heart failure.  She was assessed by Dr. Dietrich Pates  and full work-up is continued.  She has no specific complaints at this  time but has had significant fluid diuresis.   OBJECTIVE:  VITAL SIGNS:  Temperature 97.6, blood pressure 129/77, pulse  85, respirations 16.  CBGs have been 84.  Oxygen saturation 96% on 2 L  of oxygen.  Weight 63.8 kg at this time.  I&O over the last 24 hours is  she is down another 1800 cc. Overall she is down approximately 6 L of  fluid.   PHYSICAL EXAMINATION:  GENERAL:  This is an elderly white female sitting  in a chair in no acute distress.  HEENT:  Unremarkable.  NECK:  No JVD.  No thyromegaly.  No lymphadenopathy.  LUNGS:  Show some fluid in the left lower base but relatively good air  movement in all lung fields.  Do not appreciate any crackles as  previously heard.  HEART:  Slow but irregularly irregular.  ABDOMEN:  Soft, nontender.  Positive bowel sounds.  EXTREMITIES:  No lower extremity edema at this time.  Has palpable  pulses in all extremities.  NEUROLOGIC:  Grossly intact.  No deficits appreciated.   LABORATORY DATA:  Magnesium 2.1.  PT 26.2, INR 2.3.  Cardiac panel was  still negative.  BMET reveals sodium 140, potassium 3.6, chloride 103,  CO2 31, glucose 121, BUN 16, creatinine 0.78.  Calcium 8.2.   IMPRESSION:  This 77 year old white female with chronic  heart issues  seen by Dr. Dietrich Pates.   ASSESSMENT/PLAN:  1. Congestive heart failure.  She is diuresing over 6 L at this point,      awaiting full 2-D echo results.  Will continue to further diurese.      Do not feel that she has reached threshold at this time.  Does have      significant improvement on her pneumonia exam as well as chest x-      ray.  2. Atrial fibrillation with rapid ventricular response.  She is on      Cardizem and has had significant rate control at this time.  I feel      also secondary to her fluid overload, has not had the stress and      strain on the heart as previously.  3. Diabetes.  Continue to glipizide.  Her blood sugars have been on      the low  range and will continue to monitor.  4. Hypertension.  No signs of hypertension at this time.  5. Chronic anticoagulation.  PT/INR is stable.  Continue on her      current Coumadin dose.  6. Generalized debility and weakness.  Will get physical therapy to      assess and work with the patient on ambulation prior to discharge.      [name], M.D.  Electronically Signed     ZH/MEDQ  D:  11/22/2006  T:  11/22/2006  Job:  [id number]

## 2010-12-28 NOTE — Discharge Summary (Signed)
[name], [name]                  ACCOUNT NO.:  [id number]   MEDICAL RECORD NO.:  [id number]          PATIENT TYPE:  INP   LOCATION:  A220                          FACILITY:  [facility]   PHYSICIAN:  Vickki Hearing, M.D.DATE OF BIRTH:  1920/10/13   DATE OF ADMISSION:  05/20/2006  DATE OF DISCHARGE:  10/17/2007LH                                 DISCHARGE SUMMARY   ADMISSION DIAGNOSIS:  Fractured left him.   BRIEF HISTORY:  This is an 77 year old female who was injured on May 20, 2006.  She went to church, got out of the car, walked across the parking  lot, became dizzy, lost her balance and fell, was brought to the hospital  with inability to ambulate and complaints of pain in her left hip.  Initial  work-up revealed a left hip fracture.  However, patient has a history of  cardiac disease and based on her history of becoming woozy, she was admitted  to medical service with cardiac work-up recommended.  She was eventually  cleared for surgery after being taken off Coumadin and given vitamin K.    She had open treatment internal fixation of a pertrochanteric fracture of  her left hip with a Gamma 3 nail.  This was done under spinal anesthetic,  had a 100 mL blood loss.  She tolerated that well.  Postoperatively, she was  managed with resumption of Coumadin, a brief period of Lovenox, physical  therapy, glucose management and eventually was thought to be stable enough  for discharge.  At time of discharge her orthopedic examination was as  follows:  Temperature was 98.3, pulse 64, respiratory rate 16, blood  pressure 161/69, she had oxygen saturation 92%.  She was awake, alert,  oriented x3.  Her incision looked good, her leg position looked  physiologically normal.  Physiologic alignment of her limb was normal.   DISCHARGE LABORATORY STUDIES:  Hemoglobin is 10.2, platelet count 329,000,  sodium 136, potassium 3.9, chloride 104, CO2 26, glucose 103, BUN 12,  creatinine 0.6,  magnesium 2.1.   CONSULTATIONS:  1. Medicine, [name], M.D.  2. Cardiology, [facility].   DISCHARGE MEDICATIONS:  The patient is on Catapres 0.1 mg per 24 hours for a  total of seven days starting October 7, Diltiazem 360 mg orally daily,  Colace 100 mg twice daily by mouth, Glipizide 10 mg q.d. w.c.  Insulin  instructions per Dr. Margo Aye.  Lopressor 100 mg twice daily.  Minoxidil 2.5 mg  daily.  Potassium 40 mEq daily.  Zocor 40 mEq daily.  Demadex 50 mg daily.  Diovan 160 mg daily.  Coumadin current dose is 2.5 mg by mouth every 1800  hours.  See addendum per Dr. Margo Aye.  Xanax 0.25 mg daily every six hours as  needed.  Darvocet 1 by mouth every four hours as needed.   ORTHOPEDIC ORDERS:  The patient is full weightbearing.  Incision staples can  come out in three days.   There are no leg position precautions, no abduction pillow is needed.  The  patient should follow up with Dr. Romeo Apple  in one month.   Weightbearing status is as tolerated.  Physical therapy should be done  daily, gait training and strengthening, lower and upper extremities.   Again, there is an addendum per Dr. Margo Aye regarding the patient's medical  conditions, insulin, glucose management and deep venous thrombosis  prevention.      Vickki Hearing, M.D.  Electronically Signed     SEH/MEDQ  D:  05/28/2006  T:  05/28/2006  Job:  [id number]

## 2010-12-28 NOTE — Procedures (Signed)
NAMEJURNEY, OVERACKER                  ACCOUNT NO.:  [id number]   MEDICAL RECORD NO.:  [id number]          PATIENT TYPE:  INP   LOCATION:  A222                          FACILITY:  [facility]   PHYSICIAN:  Gerrit Friends. Dietrich Pates, MD, FACCDATE OF BIRTH:  Oct 21, 1920   DATE OF PROCEDURE:  11/21/2006  DATE OF DISCHARGE:  11/24/2006                                ECHOCARDIOGRAM   CLINICAL DATA:  An 77 year old woman with congestive heart failure,  atrial fibrillation and coronary disease.   M-mode:  Aorta 2.8, left atrium 5.7, septum 1.5, posterior wall 1.4, LV  diastole 4.3, LV systole 3.0.   1. Technically adequate echocardiographic study.  2. Moderate left atrial enlargement; moderate right atrial      enlargement; normal right ventricular size and function.  3. Mild to moderate sclerosis of the trileaflet aortic valve.  No      apparent stenosis nor insufficiency.  4. Normal diameter of the proximal ascending aorta; moderate      calcification of the wall.  5. Very mild mitral valve thickening; mild to moderate prolapse;      moderate regurgitation; moderate annular calcification.  6. Normal pulmonic valve and proximal pulmonary artery.  7. Normal tricuspid valve; physiologic regurgitation.  8. Normal left ventricular size; moderate hypertrophy; normal regional      and global function.  9. Comparison with prior study of April 09, 2005:  Interval      enlargement of the right atrium; mitral valve prolapse and mitral      regurgitation now identified.      Gerrit Friends. Dietrich Pates, MD, [facility]  Electronically Signed     RMR/MEDQ  D:  11/21/2006  T:  11/21/2006  Job:  [id number]

## 2010-12-28 NOTE — Discharge Summary (Signed)
[name], [name]                  ACCOUNT NO.:  [id number]   MEDICAL RECORD NO.:  [id number]          PATIENT TYPE:  INP   LOCATION:  A220                          FACILITY:  [facility]   PHYSICIAN:  [name], M.D.        DATE OF BIRTH:  10-05-1920   DATE OF ADMISSION:  05/20/2006  DATE OF DISCHARGE:  10/17/2007LH                                 DISCHARGE SUMMARY   DISCHARGE DIAGNOSES:  1. Left hip and femur fracture with internal fixation by [name], M.D.  2. Uncontrolled hypertension.  3. Diabetes mellitus x2.  4. Hypothyroidism.  5. Paroxysmal atrial fibrillation.  6. Chronic Coumadin.  7. Hypokalemia.  8. Dyslipidemia.  9. Osteoporosis.   DISCHARGE MEDICATIONS:  She will be discharged on  1. Evista 60 mg by mouth daily.  2. Potassium 40 mEq by mouth daily.  3. Clonidine patch 0.1 mg change weekly.  4. Minoxidil 2.5 mg by mouth daily.  5. Diovan 160 mg by mouth daily.  6. Metformin 500 mg twice daily.  7. Glipizide ER 10 mg by mouth daily.  8. Vitamin E capsule by mouth daily.  9. Caltrate with vitamin D 1 tab twice daily.  10.Aspirin 81 mg by mouth daily.  11.Coumadin 5 mg this evening dose and 2.5 mg every day after that.  12.Cardizem CD 360 mg by mouth daily.  13.Metoprolol 100 mg twice daily.  14.Crestor 10 mg by mouth daily.  15.Torsemide 100 mg by mouth q.d.  16.Xanax 0.25 mg as needed for anxiety.  17.Synthroid 50 mcg by mouth daily.  18.Tylenol Arthritis every six hours as needed for pain.  Any other medicines prescribed by Dr. Romeo [name] for pain, unclear if he will  send her out on Vicodin or not.   BRIEF HOSPITAL COURSE:  [name] is an 77 year old white female with  multiple medical problems as outlined in history and physical who was in her  usual state of health when apparently she had a fall of unknown etiology  which she states she did not have any blackout sensation and new where she  was at all times but ended up having a left  comminuted intertrochanteric  fracture of the left femur.  She was having treatment for osteoporosis but  given the fall, she needed further fixation by Dr. Romeo [name].  The surgery  was postponed for a day or two secondary to her Coumadin level being  elevated in the therapeutic range.   PHYSICAL EXAMINATION AT TIME OF DISCHARGE:  VITAL SIGNS:  Temperature is  98.3, blood pressure is ranging between 146/62 up to 161/69, pulse is  ranging between 58 and 64, respirations 14 to 20.  Satting 92 to 95% on room  air.  CBGs have been 197, 120, 115 and 194 respectively.  GENERALLY:  The patient is an elderly white female lying in bed in no acute  distress.  HEENT:  Unremarkable.  Mucous membrane are moist.  Neck is supple, no  jugular venous distention.  LUNGS:  Good air movement throughout, no crackles or wheezing.  HEART:  Irregular, but slow rate, 2/6 systolic murmur.  ABDOMEN:  Soft, nontender, nondistended, positive bowel sounds.  EXTREMITIES:  No lower extremity edema.  She does have chronic varicosities  in the lower extremities bilaterally and the left hip has staples in place  with no significant erythema surrounding surgical sites.  NEUROLOGIC:  Grossly intact, no deficits.  Alert and oriented x3.   LABORATORY DATA DURING HOSPITALIZATION AND AT TIME OF DISCHARGE:  Initial  CBC:  showed a white blood cell count 14.3, hemoglobin 12.7, PT/INR 28.4 and  INR of 2.5.  BMET was normal except for elevated glucose and BUN 28 and  creatinine 1.0.  Urinalysis was negative except for trace amount of blood.   Cardiac enzymes were negative x3.  TSH was 4.423, free T4 was 1.36.  Hemoglobin A1c was 6.7.  At time of discharge her last CBC on October 15 was  white count of 10,700, hemoglobin 10.2, BMET at time of discharge was sodium  136, potassium 3.9, chloride 104, CO2 26, glucose 103, BUN 12, creatinine  0.6, calcium 7.8, PT shows a pro time of 21.6, INR 1.8, magnesium of 2.1.  Scans obtained  during hospitalization:  Hip and pelvis revealed osteoporosis  and comminuted angulated intertrochanteric fracture proximal left femur, no  other pelvic abnormalities noted.  Ankle did not show any acute  abnormalities.  Chest x-ray showed cardiomegaly with minimal vascular  congestion, minimal chronic bronchiectic changes, small left pleural  effusion, minimal basilar atelectasis.   HOSPITAL COURSE:  1 - Left hip fracture.  She is postoperative day #6 from  fixation by Dr. Romeo [name] and he has been managing her pain at this time.  She has been doing well with physical therapy and will require further  physical therapy per orthopedics notes as well as follow-up for staple  removal and recheck of stability of the hip.  2 - Uncontrolled hypertension.  Changes were made to her medicines during  hospitalization and was seen by [name] Friends. Rothbart, MD, as well and  increased her metoprolol dose and decreased her Clonidine dose.  Her blood  pressure however, responded and trended back down to normal but still having  occasional elevated depending on pain.  We will continue to monitor all her  current medications as mentioned above.  3 - Diabetes mellitus type 2.  Since she was initially n.p.o. for surgery,  she was started on insulin to maximize her therapy due to post surgery.  She  was stopped on her Glucotrol and her Metformin while in the hospital but  resumed that yesterday and decreased her lantus dose.  Unlikely that she  will need routine insulin given the fact that her hemoglobin A1c was 6.7  checked in the hospital. Will continue on her Metformin and her Glucotrol at  this time.  4 - Hypothyroidism.  She did have her thyroid TSH and free T4 checked and  within normal range.  Will continue with her current dosage.  5 - Paroxysmal atrial fibrillation.  She continues to have atrial fibrillation/flutter throughout hospitalization and did not have paroxysmal  in nature.  She was stopped on  her Coumadin prior to surgery actually giving  vitamin K to reverse her Coumadin dosage in discussion with cardiology, felt  we do not need to cover with heparin at the time.  She has been resumed on  her Coumadin now post surgery and have continued to monitor daily and is  consistently trending up.  Will give another dose of  5 today and then she  will resume her 2.5 every day.  She was previously getting this checked at  [facility] and will likely need this checked routinely.  Next one should be  checked on Friday to make sure the level is adequate and therapeutic.  6 - Mild hypokalemia.  She has required a little more potassium throughout  hospitalization and has been given several additional doses of the potassium  daily.  We will continue her on the K-Dur 40 mEq once daily and will likely  need another BMET to make sure her potassium is remaining in the normal  range in the next three or four days.   DISPOSITION:  The patient will be discharged to [facility] nursing home for  further rehab.  The goal is to get her rehabilitation improved to get her  back home.  Unclear exactly her needs as far as walker and getting around  but will defer that to physical therapy and to orthopedics.  Unclear exactly  who will follow the patient in the nursing facility if she is only there for  a short period of time, would be okay to follow along with understanding  that if she requires long term care at nursing facility, would defer to  doctor that would be seeing patients at [facility].      [name], M.D.  Electronically Signed     ZH/MEDQ  D:  05/28/2006  T:  05/28/2006  Job:  [id number]   cc:   [name], M.D.  Fax: [phone]   [name] Friends. Dietrich Pates, MD, [facility]  [address]  [place], [place] [address]

## 2010-12-28 NOTE — Group Therapy Note (Signed)
NAME:  [name], [name] NO.:  [id number]   MEDICAL RECORD NO.:  [id number]          PATIENT TYPE:  INP   LOCATION:  IC04                          FACILITY:  [facility]   PHYSICIAN:  [name], M.D.        DATE OF BIRTH:  04/12/1921   DATE OF PROCEDURE:  05/24/2006  DATE OF DISCHARGE:                                   PROGRESS NOTE   SUBJECTIVE:  [name] is an 77 year old, white female with a recent fall  and fracture of her left hip due to a fall of an unknown cause.  She is  postop day #2 from fixation by Dr. Valentina Shaggy.  No specific complaints at  this time.  She does state with movement of her left leg that she does have  some pain but, otherwise, is eating and drinking normally without any  complaints of chest pain or shortness of breath.   OBJECTIVE:  VITAL SIGNS:  Temperature is 97.5.  Blood pressure has been  ranging between 132/55 up to 160/75.  Heart rate between 88 and 108.  She is  satting 98% to 99% on 3 L of oxygen.  CBGs have been 29, 31, 27, 148,  respectively.  Generally, this is an elderly white female lying in bed in no acute  distress.  HEENT:  Unremarkable.  Oropharynx and mucous membranes are moist.  NECK:  Supple.  No JVD.  LUNGS:  Good air movement throughout.  No crackles or wheezing.  HEART:  Regular irregular, mildly tachycardic, 2/6 systolic murmur.  ABDOMEN:  Soft, nontender, nondistended.  Positive bowel sounds.  EXTREMITIES:  Trace lower extremity edema.  Left leg has a bandage to left  hip area.  NEUROLOGIC:  Grossly intact.  No deficits.  Alert and oriented x3.  No signs  of lower extremity edema.   LABORATORY DATA:  BMET shows sodium 137, potassium 3.4, chloride 102, CO2  28, glucose 150, BUN 16, creatinine 0.6, calcium 10.3.  CBC showed a white  count of 12.6, hemoglobin of 10.9, platelet count 268.  PT is 15.8.  INR of  1.2.   IMPRESSION:  This is an 77 year old white female with a left femur hip  fracture, postop day #2  from fixation by Dr. Romeo Apple.   ASSESSMENT AND PLAN:  1. Left hip fracture.  Appears doing well.  Defer to orthopedics for this.      Continue to do physical therapy.  2. Uncontrolled hypertension.  Better control than before.  Was started on      the patch approximately a day and a half ago and has seen some      response, still not at goal at this time.  3. Diabetes mellitus, type 2.  Continue with Lantus and moderate sliding      scale insulin for now until on a full diet and we will then resume oral      hypoglycemics.  4. Hypothyroidism.  Had TSH and T4 checked which were normal.  Continue at      the current Synthroid dose.  5.  Paroxysmal atrial fibrillation previously on chronic Coumadin.  Since      hospitalization, she has remained in atrial fibrillation flutter and      will continue to try to rate control further.  Cardiology has seen,      assessed the patient.  We will rely on orthopedics to when we will be      able to resume her Coumadin.   DISPOSITION:  The patient will need extensive physical therapy and likely  need short-term nursing facility placement.      [name], M.D.  Electronically Signed     ZH/MEDQ  D:  05/24/2006  T:  05/24/2006  Job:  [id number]

## 2010-12-28 NOTE — Letter (Signed)
February 21, 2006     [name], MD  [phone]  [place], [place] [address]   RE:  GAYNOR, GENCO  MRN:  [id number]  /  DOB:  [birth year]   Dear Ian Malkin,   Ms. Risdon is seen in the office today at her request for evaluation of  cough.  As you know, this nice woman has multiple concerns about her medical  regime.  In the past, she was treated with ACE inhibitor and reported a  persistent cough.  This improved after Ramipril was discontinued.  She now  has recurrent symptoms and wonders if this was due to other of her  medications.  She is most concerned about Diltiazem, having received a  handout from the drugstore that suggested cough was a side effect of  treatment with this medication.  She mostly notes cough in the evening soon  after retiring.  It is productive of scant amounts of mucoid sputum.  It  does not seem to be terribly troublesome or injurious to her quality of  life.   Medications are extensive and as noted in my previous letter.  Her current  dose of levothyroxine is 0.025 mg daily.  Metformin 500 mg b.i.d. has been  added to her regime due to an elevated hemoglobin A1C level.   The most recent chest x-ray available to me is from March, at which time she  was found to have mild cardiomegaly and modest peribronchial thickening  without significant pathologic findings.   PHYSICAL EXAMINATION:  VITAL SIGNS:  Weight 152, 2 pounds more than in  February.  Blood pressure 110/70, heart rate 60 and irregular.  Respirations  16.  GENERAL:  Pleasant woman in no acute distress.  HEENT:  Anicteric sclerae; normal oral mucosa.  NECK:  No jugular venous distention; normal carotid upstrokes without  bruits.  THORAX:  Mild-to-moderate kyphosis.  Clear lung fields.  CARDIAC:  Distant first and second heart sounds; irregular rhythm; PMI not  appreciated.  ABDOMEN:  Soft and nontender; no masses; no bruits; no organomegaly.  EXTREMITIES:  Trace edema; distal pulses intact.   RHYTHM  STRIP:  Atrial fibrillation with a slow ventricular response; heart  rate is 51.   IMPRESSION:  Ms. Stevison has a mild chronic cough.  I discussed possible  treatments with her, including suppression of stomach acid for presumed  gastroesophageal reflux disease.  She is not inclined to take anymore  medication.  I also advised her that a pulmonologist would be the  appropriate specialist to further evaluate her symptoms.  She is not  inclined to pursue this course at the present time either.   She has recurrent atrial fibrillation with a relatively slow ventricular  response without symptoms.  Amiodarone will be discontinued.  We will see  her again in two months, once a significant amount of drug washout has  occurred, to determine if she requires further adjustment of her medical  regime.    Sincerely,      Gerrit Friends. Dietrich Pates, MD, [facility]   RMR/MedQ  DD:  02/21/2006  DT:  02/21/2006  Job #:  [id number]
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
## 2010-12-28 NOTE — H&P (Signed)
NAME:  [name], [name] NO.:  [id number]   MEDICAL RECORD NO.:  [id number]                   PATIENT TYPE:  INP   LOCATION:  3308                                 FACILITY:  [facility]   PHYSICIAN:  Pricilla Riffle, M.D.                 DATE OF BIRTH:  09-07-1920   DATE OF ADMISSION:  12/24/2002  DATE OF DISCHARGE:                                HISTORY & PHYSICAL   CHIEF COMPLAINT:  Ms. [name] is a 77 year old who comes in for evaluation of  chest pressure and jaw pain with atrial fibrillation.   HISTORY OF PRESENT ILLNESS:  The patient has known coronary artery disease  by catheterization in 2/01.  Left main was normal.  LAD had a 30-40% mid-  lesion.  The left circumflex had a 30% lesion.  RTA was dominant and had 50%  ostial, 40% mid.  EF of 70%.   The patient has intermittent episodes of atrial fibrillation.  She had a few  recently.  On Easter, she had a spell that was accompanied by jaw  discomfort.  She went on to have an Adenosine Cardiolite on 5/7 which showed  some anterior wall defect that was very mild and improved slightly.  Of  note, her EKG showed changes during this test.  It was felt to be a low risk  study and she has been continued on medical therapy (the patient has not  heard results yet).  The patient was at home last evening, the past week,  has been increased stress because of her husband's problems.  She went to  bed last night and woke up at 1:30 a.m. feeling some jaw pain, again, with  her heart fluttering.  It began fluttering faster.  She presented to the  [facility] Emergency Room.  There, she was found to be in a rapid atrial  fibrillation.  She converted to sinus rhythm.  She was given nitroglycerin  and Cardizem 20 mg.  Back in sinus rhythm, the jaw pain has resolved.   The patient is active, caring for her husband.  She says she does give out  and has to take things at her own pace.  Question if this is secondary to  stress  or any significant change, otherwise.   PAST MEDICAL HISTORY:  1. CAD.  2. Hypertension.  3. Breast cancer, status post left mastectomy in 1995 with Tamoxifen     therapy.  4. Hysterectomy.  5. History of diabetes, diagnosed approximately one year.  6. Osteoporosis.  7. Osteoarthritis.   CURRENT MEDICATIONS:  On admission, Diltiazem ER 120 daily, clonidine 0.3  b.i.d., hydrochlorothiazide 25 mg daily, Evista daily, Altace ?dose daily,  Fosamax q week, vitamin E, Caltrate D, fish oil, aspirin 325.   SOCIAL HISTORY:  The patient is married.  She cares for her husband who has  Alzheimer's.  She is a  retired Comptroller.  She never smoked.  Does not  drink.   FAMILY HISTORY:  Negative for CAD.   ALLERGIES:  None.   REVIEW OF SYSTEMS:  Overall have reviewed, no problems except as noted.   PHYSICAL EXAMINATION:  GENERAL:  Patient is in no acute distress.  VITAL SIGNS:  Blood pressure 150/60, pulse 70-80, temperature 98.7.  NECK:  Left carotid bruit, JVD normal, no thyromegaly.  LUNGS:  Relatively clear with occasional crackles at the left base.  CARDIAC:  Regular rate and rhythm, normal S1, S2.  Grade 2/6 systolic murmur  heard best at the base and grade 2/6 systolic murmur at the apex, consistent  with aortic sclerosis and MR respectively.  ABDOMEN:  Benign, no hepatomegaly.  EXTREMITIES:  2+ distal pulses.  Feet warm, no edema.   LABORATORY DATA:  Significant for a hemoglobin of 14.7, WBC 8.2.  BUN and  creatinine of 18.7, potassium 3.3.  Initial CK result 111 with an MB of 4.8,  troponin .02.   Chest x-ray shows some cardiac enlargement, no acute pulmonary process.  EKG  at present shows normal sinus rhythm at a rate of 73 beats/min.  LVH with  repolarization abnormality.   In atrial fibrillation, ST changes were more prominent.   IMPRESSION:  Ms. [name] is a 77 year old woman with known coronary artery  disease.  She has intermittent atrial fibrillation.  She has had two  bouts  now.  When she is in atrial fibrillation, has been accompanied by jaw pain,  question if this is an angina equivalent.  Her Cardiolite scan from 5/8 was  not completely normal.  There was a suggestion of improved perfusion in the  anterior wall.  Note also, she had EKG changes during Adenosine which is  unusual and concerning.  I have discussed this with the patient.  With her  known disease and increased symptoms and questioning increased fatigability,  I would recommend cardiac catheterization to define her anatomy.  Would  continue heparin and nitroglycerin, check her lipid and TSH in the morning.  Continue aspirin.   In regards to the patient's atrial fibrillation, I would recommend Coumadin  therapy.  I felt as if she is having more spells and concern for stroke  risks.   If the patient does not have significant increase in her coronary disease  symptoms, may indeed be just due to diastolic dysfunction in the setting of  mitral regurgitation.  But, still with her being at increased risk, would  define anatomy.                                               Pricilla Riffle, M.D.    PVR/MEDQ  D:  12/24/2002  T:  12/25/2002  Job:  [id number]

## 2010-12-28 NOTE — Consult Note (Signed)
NAMEHAYLE, [name]                  ACCOUNT NO.:  [id number]   MEDICAL RECORD NO.:  [id number]          PATIENT TYPE:  INP   LOCATION:  A208                          FACILITY:  [facility]   PHYSICIAN:  Vida Roller, M.D.   DATE OF BIRTH:  [birth year]   DATE OF CONSULTATION:  04/04/2005  DATE OF DISCHARGE:                                   CONSULTATION   CARDIOLOGY CONSULTATION   PRIMARY CARE PHYSICIANS:  Bernerd Limbo. Leona Carry, M.D.; Cardiologist:  [place]ston Bing, M.D.   REFERRING PHYSICIANS:  Vania Rea, M.D. and Dalia Heading, M.D.   DATE OF CONSULTATION:  April 04, 2005.   HISTORY OF PRESENT ILLNESS:  [name] is an 77 year old woman who is well  known to the cardiology group here at [facility] who has coronary  artery disease, is status post a stent to her left anterior descending  coronary artery in 2004 with follow up stress Myoview which revealed no  significant ischemia.  She does have paroxysmal atrial fibrillation and has  declined Coumadin therapy for this.  She was admitted to [facility]  last night with abdominal pain and found to have what appears to be  choledocholithiasis and the feeling is that she will require a  cholecystectomy and we were asked to assess her perioperative risks.  She  denies any chest discomfort.  She has not had any chest discomfort since her  stent was placed.   SOCIAL HISTORY:  She is a relatively active woman without any difficulties.  She is a widow.  She lives in [place] by herself.  She is a retired  Comptroller.  She does not smoke, drink or use illicit drugs.  She walks on a  regular basis without difficulty until this abdominal pain started.   PAST MEDICAL HISTORY:  Significant for coronary disease.  She had a stent  placed in her left anterior descending coronary artery in May of 2004.  She  has residual normal left ventricular systolic function.  She had a  subsequent Myoview after the catheterization which  showed no evidence of  ischemia.  She has diabetes.  She has a history of breast cancer, status  post mastectomy in 1995.  She has a history of atrial fibrillation.  She has  a history of osteoporosis, history of herpes zoster, history of  hypertension.  She has had a partial hysterectomy in the past.   CURRENT MEDICATIONS:  1.  Unasyn 3 grams intravenously q.6h.  2.  Clonidine 0.3 twice a day.  3.  Hydrochlorothiazide 25 mg once daily.  4.  Sliding scale insulin.  5.  Protonix.  6.  Altace, 10 mg once daily.  7.  Zocor, 40 mg once daily.  8.  She was previously on amiodarone 100 mg once a day as well as a myriad      of other medications for her blood pressure which has been very      difficult to control.  Most of these medications have been stopped.   FAMILY HISTORY:  Her mother died of a myocardial  infarction at age 67,  father died at unknown age of myocardial infarction.  She does not have any  siblings.   REVIEW OF SYSTEMS:  She has had fevers, no chills, sweating, weight loss or  adenopathy.  She denies any headaches, sinus tenderness, sinus discharge,  denies focal changes, vertigo, photophobia, visual or hearing loss. No  changes in her dentition.  She is followed for the herpes zoster by Dr. Margo Aye  and has no other significant lesions. No chest pain or shortness of breath,  no dyspnea on exertion, no paroxysmal nocturnal dyspnea, no orthopnea.  She  occasionally gets lower extremity swelling which she thinks is probably  related to the minoxidil she was taking as an outpatient.  No palpitations,  no pre syncope, no syncope, no claudication, cough or wheezing.  She denies  any urinary frequency, urgency.  She denies any weakness or numbness, no  myalgias or arthralgias.  No does have nausea and vomiting but no diarrhea  or bright red blood per rectum.  She has been constipated for a while.  She  does have abdominal pain.  She denies any polyuria or polydipsia and the   remainder of her review of systems is negative.   PHYSICAL EXAMINATION:  VITAL SIGNS:  Temperature 99.5, pulse 78 and regular  in sinus on telemetry.  Respiratory rate 24.  Blood pressure 190/80.  HEENT:  Examination of the head, eyes, ears, nose and throat unremarkable.  NECK:  Is supple.  There is no jugular venous distention or carotid bruits.  CHEST:  Has some mild bibasilar crackles.  CARDIOVASCULAR:  Regular.  She has a 2/6 holosystolic murmur.  SKIN:  Is without lesions.  BREAST/GENITOURINARY/RECTAL EXAMINATION:  Deferred.  ABDOMEN:  Soft, slightly tender in her right upper quadrant.  Normal active  bowel sounds.  EXTREMITIES:  She does not have any cyanosis, clubbing or edema.  Her pulses  are 1+ throughout.  MUSCULOSKELETAL:  Examination is nonfocal.  NEUROLOGICAL:  Nonfocal.   CLINICAL DATA:  Abdominal CT scan shows gallstones with mild gallbladder  thickening.  No pericholecystic inflammation visualized.  She has a large  hiatal hernia.  This shows cardiomegaly with pulmonary vascular congestion  which is mild.  Electrocardiogram shows sinus rhythm at a rate of 63 with  left axis deviation, non specific ST-T wave changes and left ventricular  hypertrophy.   LABORATORY DATA:  Her white blood cell count is 15.9, H/H 11 and 33,  platelet count 295,000.  Sodium 139, potassium 3.6, chloride 105, bicarb 28,  BUN 21, creatinine 0.9, blood sugar 215.  Her AST is slightly elevated at  60, ALT is normal.  Total bilirubin 0.9, alkaline phosphatase of 89, albumin  3.4, lipase 47.  Her point of care enzymes X2 are negative.  INR is 1.1.   DISCUSSION:  This is a lady who has what appears to be significant problems  with her gallbladder and will require surgery.  She has coronary artery  disease but appears to be relatively stable and has had a recent re-  vascularization procedure.  She has hypertension which is poorly controlled. She is off most of her medications and probably would  be in her best benefit  to restart these medications in an effort to get her blood pressure under  better control as this  will become a significant problem during anesthesia.  She was previously on  amiodarone and it is probably reasonable to restart that.  Her diabetes is  also something that  will need to be addressed.  It is my opinion that there  is no reason to do any further cardiovascular assessment in her prior to  surgery.      Vida Roller, M.D.  Electronically Signed     JH/MEDQ  D:  04/04/2005  T:  04/04/2005  Job:  [id number]   cc:   Bernerd Limbo. Leona Carry, M.D.  [address]  [place]  [place] [address]  Fax: [phone]   [place] Bing, M.D. [facility]  [address]  [place]  [place] [address]   Dalia Heading, M.D.  [address] [place]  [place] [address]  Fax: [phone]

## 2010-12-28 NOTE — Discharge Summary (Signed)
[name], [name] NO.:  [id number]   MEDICAL RECORD NO.:  [id number]                   PATIENT TYPE:  INP   LOCATION:  2905                                 FACILITY:  [facility]   PHYSICIAN:  Guy Franco, P.A. [facility]                DATE OF BIRTH:  1921-03-21   DATE OF ADMISSION:  01/08/2003  DATE OF DISCHARGE:  01/10/2003                           DISCHARGE SUMMARY - REFERRING   DISCHARGE DIAGNOSES:  1. Paroxysmal atrial fibrillation.  2. Coronary artery disease.  3. Diabetes.  4. Hypertension.   HOSPITAL COURSE:  The patient is a 77 year old female who was admitted to  [facility] on Jan 08, 2003, after transfer from [facility].  She presented to [facility] with chest pain and  atrial fibrillation with RVR. Just prior to this admission, she had been  admitted to [facility] and had received a stent to her LAD.  The  day before the admission, the patient had seen Dr. Dietrich Pates in the office  and her medications had been changed.  Her Cardizem had been changed to  amiodarone and her metoprolol had been discontinued.  Coumadin was  discussed, but the patient declined at that time to take this medication.   On the day of admission, the patient awoke from her sleep with bilateral jaw  pain, palpitations, and chest pain.  She was ultimately transferred to [facility] and underwent a nuclear Adenosine Cardiolite which revealed no  abnormality and no ischemia, EF 61.   LABORATORY DATA:  Cardiac isoenzymes which were negative with the exception  of a troponin of 0.05.  BNP was slightly elevated at 316.  White count 11.6,  hemoglobin 13.4, hematocrit 39.5, platelets 353.   Chest x-ray revealed cardiomegaly without CHF.  There was atelectasis at the  left base.   The patient eventually went home on Jan 10, 2003, in stable condition.   DISCHARGE MEDICATIONS:  1. Amiodarone 200 mg two tablets  twice a day for seven days and two tablets     daily for one month and then one tablet daily.  2. Plavix 75 mg a day.  3. Aspirin 325 mg a day.  4. Evista 60 mg daily.  5. Zocor 40 mg q.h.s.  6. Glipizide 10 mg a day.  7. Altace 10 mg a day.  8. Hydrochlorothiazide 25 mg a day.  9. Clonidine 0.3 mg b.i.d.  10.      Fosamax 70 mg weekly.  11.      Nitroglycerin as needed for chest pain.   ACTIVITY:  As tolerated.   DIET:  Remain on a low salt, low fat, low cholesterol diabetic diet.   FOLLOW UP:  Call for any questions or concerns.  The office will call the  patient for an appointment with Dr. Dietrich Pates in the [place] office.  Guy Franco, P.A. [facility]    LB/MEDQ  D:  04/11/2003  T:  04/11/2003  Job:  [id number]   cc:   [place] Bing, M.D.   Bernerd Limbo. Leona Carry, M.D.  [address]  [place]  [place] [address]  Fax: [phone]

## 2010-12-28 NOTE — Procedures (Signed)
NAME:  [name], [name]                  ACCOUNT NO.:  [id number]   MEDICAL RECORD NO.:  [id number]          PATIENT TYPE:  INP   LOCATION:  IC04                          FACILITY:  [facility]   PHYSICIAN:  Edward L. Juanetta Gosling, M.D.DATE OF BIRTH:  [birth year]   DATE OF PROCEDURE:  DATE OF DISCHARGE:                                EKG INTERPRETATION   TIME:  0322 hours  DATE:  April 07, 2005   The rhythm is atrial fibrillation with a rapid ventricular response at  around 140. There are ST-T wave abnormalities which could indicate ischemia  and clinical correlation is suggested. Abnormal electrocardiogram.      Oneal Deputy. Juanetta Gosling, M.D.  Electronically Signed     ELH/MEDQ  D:  04/07/2005  T:  04/07/2005  Job:  [id number]

## 2010-12-28 NOTE — Consult Note (Signed)
[name], [name]                  ACCOUNT NO.:  [id number]   MEDICAL RECORD NO.:  [id number]          PATIENT TYPE:  INP   LOCATION:  A203                          FACILITY:  [facility]   PHYSICIAN:  [name], M.D.        DATE OF BIRTH:  1921-03-31   DATE OF CONSULTATION:  05/20/2006  DATE OF DISCHARGE:                                   CONSULTATION   PRIMARY CARE PHYSICIAN:  Dr. Margo Aye   PRIMARY CARDIOLOGIST:  Dr. Dietrich Pates   ORTHOPEDIC PHYSICIAN:  Dr. Vickki Hearing   HISTORY OF PRESENT ILLNESS:  [name] is an 77 year old white female with  multiple medical problems as outlined below, who was in her usual state of  health with minimal to no complaints other than occasional arthritic type  pain in her hips and ankles. She was going to a church meeting today and  apparently became unstable just outside the church and fell on her left hip.  She was unable to move and EMS was called. She was found to have a  comminuted inner trochanteric fracture of the proximal left femur. She did  not have any syncope or dizziness prior to this event. She did have a  previous history of osteoporosis and was on Evista and calcium for this.  She was seen and assessed by the ER physician as well as Dr. Romeo Apple and  set up for surgery, and I was asked to admit to my service given the fact  that Dr. Romeo Apple may be out of town towards the end of the week.  Awaiting  multiple lab work at this time.   PAST MEDICAL HISTORY:  1. She has hypertension.  2. Diabetes mellitus type 2.  3. Paroxysmal atrial fibrillation.  4. Hypothyroidism.  5. Coronary artery disease status post stent in 2004.  6. History of left breast cancer in 1995 status post mastectomy.  7. Dyslipidemia.  8. Osteoporosis.   PAST SURGICAL HISTORY:  As mentioned above, stenting in 2004. Left  mastectomy. Hysterectomy. Gallbladder surgery in August of 2006.   SOCIAL HISTORY:  She is a widow, lives by herself. She is retired. Does  not  smoke. No alcohol. No drugs.   FAMILY HISTORY:  Father died around age 78 of myocardial infarction. Mother  died of diabetes, exact cause of death unclear. She had two siblings both of  whom have died.  She has three children.   ALLERGIES:  ALTACE AND ACE INHIBITOR - may cause cough.   MEDICATIONS:  1. Evista 60 mg daily.  2. Potassium 20 mEq b.i.d.  3. Clonidine 0.3 mg b.i.d.  4. Minoxidil 2.5 mg p.o. daily.  5. Diovan 160 mg daily.  6. Metformin 500 mg b.i.d.  7. Glipizide ER 10 mg daily.  8. Vitamin E.  9. Caltrate with vitamin D.  10.Aspirin 81 mg daily.  11.Coumadin 2.5 mg as directed by [place].  12.Cardizem CD 360 mg daily.  13.Metoprolol 50 mg b.i.d.  14.Crestor 10 mg p.o. daily.  15.Torsemide 100 mg p.o. daily.  16.Xanax 0.25 mg p.r.n. anxiety.  17.Cod  liver oil capsules daily.  18.Synthroid 50 mcg p.o. daily.  19.Tylenol arthritis p.r.n.   REVIEW OF SYSTEMS:  The patient denies any fever or chills. She does feel  some upper abdominal central pain which is acute, was given morphine x1 in  the emergency department and she did feel a little diaphoretic when this  occurred but pain subsided quickly. She denied any problems with bowel or  bladder.  Does have obvious pain to left hip and states having some pain with ankles  which has been occurring routinely.   PHYSICAL EXAMINATION:  VITAL SIGNS:  On admission temperature was 98.4,  blood pressure 123/54, heart rate 66, respiration 18.  GENERAL:  This is an elderly white female lying in bed in no acute distress.  HEENT:  Unremarkable. Oropharynx is moist, does have some dryness around her  lips. Normocephalic, atraumatic. Pupils are equal, round and reactive to  light and accommodation.  NECK:  Supple, no JVD, no lymphadenopathy.  LUNGS:  Good air movement throughout, no crackles or wheezes appreciated.  HEART:  Irregularly irregular but very slow in nature with a 2 out of 6  systolic murmur.  ABDOMEN:  Soft,  question some mild epigastric pain to deep palpation,  especially when she was having the specific pain. Did not notice any  specific nausea when palpating this area. Positive bowel sounds throughout.  EXTREMITIES:  Trace lower extremity edema. Multiple varicose veins on lower  extremities. Palpable pulses in all extremities.  NEURO EXAM:  Grossly intact. No deficits. Alert and oriented x3.   As mentioned above, lab work is all pending.  EKG shows atrial fibrillation  with no ST wave changes.   IMPRESSION:  [name] is an 77 year old unfortunate lady who has had a left  femur hip fracture, comminuted in nature. She is being seen by Dr. Romeo Apple  for repair.  She has multiple other medical problems which are also being  addressed.   ASSESSMENT AND PLAN:  1. Left hip fracture. Will have surgery once PT and INR is amenable to      surgery. Will likely need to be started on heparin. In the meantime      given her atrial fibrillation, will get cardiology involved as well for      clearance due to her significant cardiac history.  2. Upper abdominal pain. This sounds like it is more strong in nature but      unclear whether this is acute coronary syndrome derivative. Will cycle      enzymes on this. Given her significant coronary artery disease history      will get cardiology input on this.  3. Difficult to control hypertension. She is on multiple medicines for      this and we will continue to monitor in the hospital and adjust      appropriately.  4. Diabetes mellitus type 2. Will continue her only on sliding scale      insulin for now given the fact that she will be n.p.o. Will check a      hemoglobin A1C  once all this over then adjusting her metformin and Glipizide and resuming  these medications.  1. Hypothyroidism. Will recheck a TSH and free T4 and continue her on her      Synthroid dosage. 2. Hyperlipidemia. Continue on Crestor as previously prescribed.  3. Paroxysmal atrial  fibrillation on chronic Coumadin. As mentioned above,      will get cardiology involved with this. Will likely need  to start on      heparin once patient is subtherapeutic on her Coumadin and continue per      surgery's suggestion about stopping and resuming this to resume her      back on her Coumadin dosage eventually.      [name], M.D.  Electronically Signed     ZH/MEDQ  D:  05/20/2006  T:  05/20/2006  Job:  [id number]   cc:   Vickki Hearing, M.D.  Fax: [phone]   Gerrit Friends. Dietrich Pates, MD, [facility]  [address]  [place], [place] [address]

## 2010-12-28 NOTE — Cardiovascular Report (Signed)
[place]  Patient:    [name], [name]                         MRN: [id number] Proc. Date: 10/04/99 Adm. Date:  [id number] Attending:  Mirian Mo CC:         Veneda Melter, M.D. [facility]             Thomas C. Wall, M.D. [facility]             Arna Snipe, M.D.                        Cardiac Catheterization  PROCEDURES PERFORMED: 1. Left heart catheterization. 2. Left ventriculogram. 3. Intravascular ultrasound of the right coronary artery.  DIAGNOSES: 1. Moderate two-vessel coronary artery disease. 2. Normal left ventricular systolic function.  HISTORY:  [name] is a 77 year old white female who presents with tachy palpitations and jaw discomfort.  The patient was found to be in atrial fibrillation and had mild ST depression in the lateral leads.  She was subsequently admitted to [facility] and converted to normal sinus rhythm spontaneously. She ruled out for acute myocardial infarction and presents now for left heart catheterization.  TECHNIQUE:  After informed consent was obtained, the patient was brought to the  catheterization laboratory where both groins were sterilely prepped and draped.  One percent Lidocaine was used to infiltrate the right groin and a 6-French sheath was placed in the right femoral artery using the modified Seldinger technique. Six [place] JL-4 and JR-4 catheters were then used to engage the left and right coronary arteries and selective angiography was performed in various projections using manual injections of contrast.  Damping of the wave form was noted with engagement of the JR-4 catheter in the RCA and we introduced a 5-French modified right catheter to obtain further selective angiography of the ostium of the RCA. Subsequently, a 6-French pigtail catheter was advanced into the left ventricle nd left ventriculogram was performed using power injections of contrast.  The initial findings are as  follows.  RESULTS:  CORONARY ANGIOGRAPHY: 1. Left main trunk:  Normal.  2. Left anterior descending artery:  This is a large caliber vessel that provides    two diagonal branches.  The LAD has sequential narrowings of 30% and 40% in he    mid section of the vessel.  The remainder of the LAD system has mild    irregularities.  3. Left circumflex artery:  This is a medium caliber vessel that provides four    marginal branches.  The first marginal branch has a mild narrowing of 30% in its    proximal segment.  The remainder of the left circumflex system has mild    irregularities.  4. Right coronary artery:  The right coronary artery is dominant.  It is a large    caliber vessel that provides a posterior descending artery, as well as several    small posteroventricular branches in its terminal segment.  There appears to be    an ostial narrowing of 50% to 60% and the mid section of the vessel is mildly    and diffusely diseased.  There is a focal narrowing of 40% at the takeoff of an    RV marginal branch.  A further narrowing of 40% is noted in the distal section    prior to the takeoff of the posterior descending artery.  The remainder of the    vessel has mild irregularities.  LEFT VENTRICULOGRAM:  Normal end-systolic and end-diastolic dimensions. Overall left ventricular function is well preserved.  Ejection fraction is greater than  70%.  Mitral regurgitation could not be assessed due to ventricular ectopy.  LV  pressure is 160/5.  Aortic pressure is 160/60.  LV EDP is 15.  At this point, we elected to further interrogate the ostial RCA.  Despite the administration of intracoronary nitroglycerin during diagnostic angiography, there appeared to be residual high-grade narrowing at the ostium.  INTRAVASCULAR ULTRASOUND:  We administered 3000 units of intravenous heparin and engaged the RCA with a 6-French JR-4 guide catheter.  A 0.014 inch Sport wire was advanced  in the distal RCA.  An Atlantis IVUS catheter was introduced.  IVUS was then performed of the RCA using automated pullback.  This showed the mid section of the vessel to be a large caliber vessel with a lumen of 4.5 mm.  The proximal segment showed moderate disease with tapering of the vessel as it approached the ostium.  The lumen at the ostium was 3.0 x 2.5. mm.  This was considered noncritical.  Repeat angiography was then performed, showing no evidence of vessel damage.  The guide catheter was then removed and the sheath was secured into position.  The patient was transferred to the floor in stable condition, where he sheath will be removed when the ACT returns to normal.  ASSESSMENT:  [name] is a 77 year old white female with atrial fibrillation and jaw discomfort with nonspecific ECG changes.  She has moderate two-vessel coronary artery disease by angiogram and normal left ventricular function.  PLAN:  At this point, further attention will be paid to controlling her dysrhythmia and hypertension.  Consideration may be given towards stress imaging study to determine the significance of the LAD and RCA lesions. DD:  10/04/99 TD:  10/05/99 Job: [id number]

## 2010-12-28 NOTE — Procedures (Signed)
NAME:  KAREY, SUTHERS                  ACCOUNT NO.:  [id number]   MEDICAL RECORD NO.:  [id number]          PATIENT TYPE:  INP   LOCATION:  IC04                          FACILITY:  [facility]   PHYSICIAN:  Edward L. Juanetta Gosling, M.D.DATE OF BIRTH:  [birth year]   DATE OF PROCEDURE:  04/07/2005  DATE OF DISCHARGE:                                EKG INTERPRETATION   TIME:  0713 hours  DATE:  April 07, 2005   The rhythm is atrial fibrillation with a rapid ventricular response. There  is probable left ventricular hypertrophy. There are ST-T wave abnormalities  which are suggestive of ischemia. The computer has read ST elevation  suggesting an acute MI. This does not look a great deal different than the  other EKGs and I would probably not be willing to say that this EKG  represents an acute MI at this point. Abnormal electrocardiogram.      Oneal Deputy. Juanetta Gosling, M.D.  Electronically Signed     ELH/MEDQ  D:  04/07/2005  T:  04/07/2005  Job:  [id number]

## 2010-12-28 NOTE — Group Therapy Note (Signed)
NAME:  [name], [name]                  ACCOUNT NO.:  [id number]   MEDICAL RECORD NO.:  [id number]          PATIENT TYPE:  INP   LOCATION:  A220                          FACILITY:  [facility]   PHYSICIAN:  [name], M.D.        DATE OF BIRTH:  07-15-1921   DATE OF PROCEDURE:  DATE OF DISCHARGE:                                   PROGRESS NOTE   [name] is a 77 year old white female with recent fracture of her left hip  and femur, post-op day #4 from fixation by Dr. Romeo Apple.  She continues to  have some left hip pain with movement but otherwise appears to be doing  well.  Denies any chest pain or shortness of breath.  She is eating and  drinking well, back to baseline as far as that is concerned.  She is getting  routine physical therapy and appears to be doing well with that.  She was  seen and assessed by Dr. Dietrich Pates again today, who has made some adjustments  to her medicines, due to her a-fib and previous cardiac history.   OBJECTIVE:  VITAL SIGNS:  Temperature is 98.5, blood pressure is 110/61,  pulse 83, respirations 20, CBGs have been 148, 166, 261 and 189  respectively.  GENERAL:  This is elderly white female in no acute distress.  HEENT:  Unremarkable.  Pharynx:  Mucous membranes moist.  NECK:  Supple, no JVD.  LUNGS:  Good air movement throughout.  No crackles or wheezing.  HEART:  Regular, slow rate, 2/6 systolic murmur.  ABDOMEN:  Soft, nontender, nondistended, positive bowel sounds.  EXTREMITIES:  Trace lower extremity edema.  Does have continued  varicosities.  Lower extremity:  Chronic left leg wound shows no erythema,  staples in place.  NEUROLOGIC:  Grossly intact.  No deficit.  Alert and oriented x3.   LABORATORY DATA:  BMET shows a sodium of 133, potassium 3.6, chloride 101,  CO2 26, glucose 159, BUN 15, creatinine 0.6, calcium 7.7.  CBC showed a  white count of 10.2, platelet count of 329.  PT showed pro-time of 15.4, INR  of 1.2.   IMPRESSION:  This is an  77 year old white female with left femur and hip  fracture, post-op day 4 from fixation by Dr. Romeo Apple.  Getting routine  physical therapy.   ASSESSMENT/PLAN:  1. Left hip fracture, appears to be doing well.  No note today from Dr.      Romeo Apple.  I will continue with physical therapy, and I question when      patient will be released from their standpoint for further rehab.      Apparently, FL2 has been put on the chart for Dr. Romeo Apple to sign.  I      will be happy to sign it, if he is unable to.  2. Uncontrolled hypertension, appears to be getting back to normal.  She      was started on Catapres patch, but this is decreased, and metoprolol      was increased to further rate control her a-fib.  3.  Diabetes mellitus type 2.  Still not at goal, as far as her blood      sugars during the day.  Will resume her oral medications and decrease      Lantus dose.  Continue to monitor this.  4. Hypothyroidism.  Continue her current dose. TSH and T4 were checked.  5. History of paroxysmal a-fib, but she has been routinely in a-fib during      the hospitalization.  She is resuming her Coumadin at this time and      will continue to monitor PT/INR.  6. Mild hypokalemia.  This was repleted and will be started on Routine      daily potassium.  7. Disposition.  Patient is awaiting short-term nursing facility placement      for further rehab, due to her hip fracture and all of her medical      conditions appear to be stabilized and will follow along.      [name], M.D.  Electronically Signed     ZH/MEDQ  D:  05/26/2006  T:  05/26/2006  Job:  [id number]

## 2010-12-28 NOTE — Group Therapy Note (Signed)
NAMEMICHON, [name]                  ACCOUNT NO.:  [id number]   MEDICAL RECORD NO.:  [id number]          PATIENT TYPE:  INP   LOCATION:  IC04                          FACILITY:  [facility]   PHYSICIAN:  [name], M.D.        DATE OF BIRTH:  March 23, 1921   DATE OF PROCEDURE:  05/23/2006  DATE OF DISCHARGE:                                   PROGRESS NOTE   SUBJECTIVE:  [name] is an 77 year old white female with multiple medical  problems who had a fall of unknown cause with fracture to her left hip. She  was on chronic Coumadin and had to await this to become sub-therapeutic  before Dr. Romeo Apple was able to do surgery. She did have surgery yesterday  and her pain appears to be much better controlled at this time. Of note, she  is in the ICU post surgery; and was having some episodes of hypertension,  this morning, but just had her Clonidine resumed. As mentioned above, she  does feel her pain is under better control and will continue to monitor.  Unclear whether the Dilaudid was causing some of her elevations in her blood  pressure; but the patient did not want to take any oral medicines at this  time only IV and she did have reaction to the morphine.   OBJECTIVE:  VITAL SIGNS:  T-max is 100.3; T-current is 98.5.  Blood  pressures have been ranging between 148/70 up to 180/75; and now are  trending back down, and are currently at 135/85.  Heart rate initially was  85, went up to 125 and now is trending back down to 105 at this time.  She  is saturation 97-99% on 2 liters of oxygen.  CBGs have been 206, 293, and  283 respectively.  GENERAL:  This is an elderly white female lying in bed in  no acute distress.  HEENT:  Unremarkable.  NECK:  Supple.  No JVD.  LUNGS:  Good air movement throughout no crackles or wheezing.  HEART:  Irregularly irregular.  Tachycardiac.  She continues to have a 2/6  systolic murmur.  ABDOMEN:  Soft, nontender, nondistended.  Positive bowel sounds.  EXTREMITIES:   Trace lower extremity edema and multiple varicose veins.  Left  leg has been in fixation and left hip is banded appropriately.  NEUROLOGIC:  No significant deficits, alert and oriented x3.   LAB WORK:  She has BMET of 141, potassium of 3.4, chloride of 106, CO2 27,  glucose 216, BUN 18, creatinine 0.8, calcium 7.6, CBC shows a white count of  13.3, hemoglobin 11.6, platelet count 259, PT of 16.9 with an INR of 1.3.  Of note hemoglobin A1c was 6.7.  Free T4 is 1.36 and a TSH was 4.423.   IMPRESSION:  [name] is an 77 year old white female with a left femur  fracture and a hip fracture postop day #1 from surgery by Dr. Romeo Apple.   ASSESSMENT AND PLAN:  1. Left hip fracture.  She did have surgery yesterday by Dr. Romeo Apple, and  apparently has a comminuted pertrochanteric fracture in essentially 4      parts.  It appears that he did have significant repair to this area.      Pain appears to be relatively under good control.  We will continue      with the p.r.n. Dilaudid and eventually transition to oral medicines as      the patient tolerates.  2. Uncontrolled hypertension.  She was held on some of her medicines      secondary to the surgery, but now that she is out resuming her      clonidine patch last evening her blood pressures have returned back      into the normal range.  3. Paroxysmal atrial fibrillation.  She did have rapid rate this morning,      question whether it relates to pain, but it will continue on the rate      controlled drugs and continue to monitor.  Cardiology has been seeing      the patient as well.  4. Chronic Coumadin.  Unclear of the exact time when to resume her      Coumadin following surgery.  We will rely on orthopedic input for this.  5. Diabetes mellitus type 2.  She was taken off of her oral hypoglycemics      due to the surgery and will continue on increased sliding scale at this      time and add on Lantus insulin to help get her blood sugars  under      better control post surgery.  Her hemoglobin A1c is 6.7 as an      outpatient and we will likely be able to transitional back to oral      hypoglycemics once the patient is more mobile and appetite is more      appropriate.  6. Hypothyroidism.  TSH and free T4 were within normal range.  Continue      the Synthroid dose.  7. Hyperlipidemia.  Continue the Crestor.  8. Hyperkalemia.  Very minimal.  Will replete with K-Dur at this time.   DISPOSITION:  The patient will need considerable physical therapy and likely  will need SNF placement upon discharge from the hospital.  I will continue  to follow along with physical therapy.      [name], M.D.  Electronically Signed     ZH/MEDQ  D:  05/23/2006  T:  05/24/2006  Job:  [id number]

## 2011-01-14 ENCOUNTER — Ambulatory Visit (INDEPENDENT_AMBULATORY_CARE_PROVIDER_SITE_OTHER): Payer: Medicare Other | Admitting: *Deleted

## 2011-01-14 DIAGNOSIS — I6350 Cerebral infarction due to unspecified occlusion or stenosis of unspecified cerebral artery: Secondary | ICD-10-CM

## 2011-01-14 DIAGNOSIS — Z7901 Long term (current) use of anticoagulants: Secondary | ICD-10-CM

## 2011-01-14 DIAGNOSIS — I4891 Unspecified atrial fibrillation: Secondary | ICD-10-CM

## 2011-01-14 DIAGNOSIS — I639 Cerebral infarction, unspecified: Secondary | ICD-10-CM

## 2011-02-11 ENCOUNTER — Ambulatory Visit (INDEPENDENT_AMBULATORY_CARE_PROVIDER_SITE_OTHER): Payer: Medicare Other | Admitting: *Deleted

## 2011-02-11 DIAGNOSIS — I4891 Unspecified atrial fibrillation: Secondary | ICD-10-CM

## 2011-02-11 DIAGNOSIS — Z7901 Long term (current) use of anticoagulants: Secondary | ICD-10-CM

## 2011-02-11 DIAGNOSIS — I6350 Cerebral infarction due to unspecified occlusion or stenosis of unspecified cerebral artery: Secondary | ICD-10-CM

## 2011-02-11 DIAGNOSIS — I639 Cerebral infarction, unspecified: Secondary | ICD-10-CM

## 2011-02-11 LAB — POCT INR: INR: 2

## 2011-03-12 ENCOUNTER — Encounter: Payer: Self-pay | Admitting: Cardiology

## 2011-03-18 ENCOUNTER — Encounter: Payer: Self-pay | Admitting: Cardiology

## 2011-03-18 ENCOUNTER — Ambulatory Visit (INDEPENDENT_AMBULATORY_CARE_PROVIDER_SITE_OTHER): Payer: Medicare Other | Admitting: *Deleted

## 2011-03-18 ENCOUNTER — Ambulatory Visit (INDEPENDENT_AMBULATORY_CARE_PROVIDER_SITE_OTHER): Payer: Medicare Other | Admitting: Cardiology

## 2011-03-18 VITALS — BP 145/61 | HR 63 | Ht 60.0 in | Wt 143.0 lb

## 2011-03-18 DIAGNOSIS — I639 Cerebral infarction, unspecified: Secondary | ICD-10-CM

## 2011-03-18 DIAGNOSIS — I739 Peripheral vascular disease, unspecified: Secondary | ICD-10-CM

## 2011-03-18 DIAGNOSIS — Z7901 Long term (current) use of anticoagulants: Secondary | ICD-10-CM

## 2011-03-18 DIAGNOSIS — M48061 Spinal stenosis, lumbar region without neurogenic claudication: Secondary | ICD-10-CM

## 2011-03-18 DIAGNOSIS — I4891 Unspecified atrial fibrillation: Secondary | ICD-10-CM

## 2011-03-18 DIAGNOSIS — I341 Nonrheumatic mitral (valve) prolapse: Secondary | ICD-10-CM

## 2011-03-18 DIAGNOSIS — I679 Cerebrovascular disease, unspecified: Secondary | ICD-10-CM

## 2011-03-18 DIAGNOSIS — I6350 Cerebral infarction due to unspecified occlusion or stenosis of unspecified cerebral artery: Secondary | ICD-10-CM

## 2011-03-18 DIAGNOSIS — C50919 Malignant neoplasm of unspecified site of unspecified female breast: Secondary | ICD-10-CM

## 2011-03-18 DIAGNOSIS — I509 Heart failure, unspecified: Secondary | ICD-10-CM

## 2011-03-18 DIAGNOSIS — I5032 Chronic diastolic (congestive) heart failure: Secondary | ICD-10-CM

## 2011-03-18 DIAGNOSIS — I2510 Atherosclerotic heart disease of native coronary artery without angina pectoris: Secondary | ICD-10-CM

## 2011-03-18 DIAGNOSIS — I10 Essential (primary) hypertension: Secondary | ICD-10-CM

## 2011-03-18 DIAGNOSIS — E039 Hypothyroidism, unspecified: Secondary | ICD-10-CM

## 2011-03-18 DIAGNOSIS — E785 Hyperlipidemia, unspecified: Secondary | ICD-10-CM

## 2011-03-18 MED ORDER — TRAMADOL-ACETAMINOPHEN 37.5-325 MG PO TABS: 1.0000 | ORAL_TABLET | Freq: Four times a day (QID) | ORAL | Status: AC | PRN

## 2011-03-18 NOTE — Assessment & Plan Note (Addendum)
Blood pressure control is marginal, but patient reports invariably good results when blood pressures assessed outside of a healthcare setting.  She will collect her values for [place], which will be reviewed at her next visit.  A metabolic profile and CBC will be obtained.

## 2011-03-18 NOTE — Patient Instructions (Signed)
PLEASE SEE ANTICOAGULATION AVS

## 2011-03-18 NOTE — Assessment & Plan Note (Signed)
Excellent lipid profile when last assessed 6 months ago.  We will continue to monitor.

## 2011-03-18 NOTE — Assessment & Plan Note (Signed)
No apparent active congestive heart failure at the present time.

## 2011-03-18 NOTE — Assessment & Plan Note (Signed)
Anticoagulation has been stable and therapeutic.  A CBC and stool for Hemoccult testing will be obtained to exclude occult GI blood loss.

## 2011-03-18 NOTE — Patient Instructions (Signed)
Your physician recommends that you schedule a follow-up appointment in:10 months Your physician recommends that you return for lab work in: today Your physician has recommended you make the following change in your medication: ultracet 1-2 tablets by mouth three times daily as needed

## 2011-03-18 NOTE — Assessment & Plan Note (Signed)
Ms. Slimp has chronic back pain with left leg discomfort as well, which may be related to degenerative joint disease of the lumbosacral spine.  She's been taking Tylenol without much relief.  She is worried about taking nonsteroidals in the setting of full anticoagulation.  I provided her with a prescription for Ultracet.  If this is ineffective, I would encourage her to use nonsteroidals at the lowest effective dose once or twice a day.

## 2011-03-18 NOTE — Assessment & Plan Note (Deleted)
Atrial fibrillation is chronic and asymptomatic.  We will continue anticoagulation and our rate control strategy.  

## 2011-03-18 NOTE — Assessment & Plan Note (Signed)
Patient has had no symptoms for years suggesting a recurrence of symptomatic coronary artery disease.  We will continue our efforts to optimally control cardiovascular risk factors.

## 2011-03-18 NOTE — Progress Notes (Signed)
HPI : Ms. Hammett returns to the office as scheduled for continued assessment and treatment of atrial fibrillation, coronary disease which has not been a clinical issue for many years, cardiovascular risk factors and a history of congestive heart failure with preserved left ventricular systolic function.  Since her last visit, she has done quite well from a cardiac standpoint.  She is able to do her housework without dyspnea, orthopnea, PND, lightheadedness, syncope or chest discomfort.  She has been troubled by back pain, left leg discomfort and dysfunction and unsteady gait.  She has not fallen for quite some time.  Some months ago, she was admitted to hospital after a fall and transferred to [facility] for skilled nursing care.  Despite 6 weeks of rehabilitative services, she does not believe that she improved with physical therapy.  Her problems with her gait were attributed to multiple issues including peripheral neuropathy, left ankle degenerative joint disease, plantar fasciitis and a femoral fracture that occurred in 2007 and required ORIF. Current Outpatient Prescriptions on File Prior to Visit  Medication Sig Dispense Refill  . aspirin 81 MG EC tablet Take 81 mg by mouth daily.        . calcium-vitamin D (OSCAL 500/200 D-3) 500-200 MG-UNIT per tablet Take 1 tablet by mouth daily.        Marland Kitchen diltiazem (CARDIZEM CD) 240 MG 24 hr capsule Take 240 mg by mouth daily.        . fexofenadine (ALLEGRA) 180 MG tablet Take 180 mg by mouth daily.        Marland Kitchen losartan (COZAAR) 50 MG tablet Take 50 mg by mouth daily.        . Multiple Vitamins-Minerals (ICAPS PLUS) TABS Take 1 tablet by mouth daily.        Marland Kitchen omeprazole (PRILOSEC) 20 MG capsule Take 20 mg by mouth 2 (two) times daily.        . potassium chloride 20 MEQ/15ML (10%) solution Take by mouth daily.        . raloxifene (EVISTA) 60 MG tablet Take 60 mg by mouth daily.        . rosuvastatin (CRESTOR) 10 MG tablet Take 10 mg by mouth at bedtime as needed and  may repeat dose one time if needed.        . warfarin (COUMADIN) 2 MG tablet Take by mouth as directed.        Marland Kitchen Cod Liver Oil CAPS Take 1 capsule by mouth daily.           No Known Allergies    Past medical history, social history, and family history reviewed and updated.  ROS: See history of present illness.  PHYSICAL EXAM: BP 145/61  Pulse 63  Ht 5' (1.524 m)  Wt 64.864 kg (143 lb)  BMI 27.93 kg/m2  SpO2 96%  General-Well developed; no acute distress Body habitus-proportionate weight and height Neck-No JVD; right carotid bruit vs transmitted murmur Lungs-clear lung fields; resonant to percussion; moderate kyphosis Cardiovascular-normal PMI; normal S1 and S2; irregular rhythm; grade 2-3/6 systolic ejection murmur at the cardiac base, possibly radiating to the left carotid Abdomen-normal bowel sounds; soft and non-tender without masses or organomegaly Musculoskeletal-No deformities, no cyanosis or clubbing Neurologic-Normal cranial nerves; symmetric strength and tone Skin-Warm, no significant lesions Extremities-distal pulses intact; 2+ ankle edema  ASSESSMENT AND PLAN:

## 2011-03-19 ENCOUNTER — Encounter: Payer: Self-pay | Admitting: *Deleted

## 2011-03-19 LAB — COMPREHENSIVE METABOLIC PANEL
ALT: 12 U/L (ref 0–35)
Alkaline Phosphatase: 56 U/L (ref 39–117)
Creat: 0.79 mg/dL (ref 0.50–1.10)
Glucose, Bld: 142 mg/dL — ABNORMAL HIGH (ref 70–99)
Sodium: 139 mEq/L (ref 135–145)
Total Bilirubin: 0.3 mg/dL (ref 0.3–1.2)
Total Protein: 6.5 g/dL (ref 6.0–8.3)

## 2011-03-19 LAB — CBC WITH DIFFERENTIAL/PLATELET
Eosinophils Absolute: 0.2 10*3/uL (ref 0.0–0.7)
Eosinophils Relative: 4 % (ref 0–5)
HCT: 38.1 % (ref 36.0–46.0)
Lymphocytes Relative: 26 % (ref 12–46)
Lymphs Abs: 1.7 10*3/uL (ref 0.7–4.0)
MCH: 27.6 pg (ref 26.0–34.0)
MCV: 89 fL (ref 78.0–100.0)
Monocytes Absolute: 0.6 10*3/uL (ref 0.1–1.0)
Platelets: 261 10*3/uL (ref 150–400)
RBC: 4.28 MIL/uL (ref 3.87–5.11)
WBC: 6.3 10*3/uL (ref 4.0–10.5)

## 2011-03-21 ENCOUNTER — Encounter: Payer: Self-pay | Admitting: Cardiology

## 2011-03-21 DIAGNOSIS — I4891 Unspecified atrial fibrillation: Secondary | ICD-10-CM | POA: Insufficient documentation

## 2011-03-21 DIAGNOSIS — I739 Peripheral vascular disease, unspecified: Secondary | ICD-10-CM | POA: Insufficient documentation

## 2011-03-21 DIAGNOSIS — C50919 Malignant neoplasm of unspecified site of unspecified female breast: Secondary | ICD-10-CM | POA: Insufficient documentation

## 2011-03-21 DIAGNOSIS — I341 Nonrheumatic mitral (valve) prolapse: Secondary | ICD-10-CM | POA: Insufficient documentation

## 2011-03-21 DIAGNOSIS — E039 Hypothyroidism, unspecified: Secondary | ICD-10-CM | POA: Insufficient documentation

## 2011-03-21 DIAGNOSIS — M810 Age-related osteoporosis without current pathological fracture: Secondary | ICD-10-CM | POA: Insufficient documentation

## 2011-03-21 DIAGNOSIS — I679 Cerebrovascular disease, unspecified: Secondary | ICD-10-CM | POA: Insufficient documentation

## 2011-03-21 DIAGNOSIS — I2510 Atherosclerotic heart disease of native coronary artery without angina pectoris: Secondary | ICD-10-CM | POA: Insufficient documentation

## 2011-03-21 NOTE — Assessment & Plan Note (Signed)
Atrial fibrillation is chronic and asymptomatic.  We will continue anticoagulation and our rate control strategy.

## 2011-04-01 ENCOUNTER — Ambulatory Visit (INDEPENDENT_AMBULATORY_CARE_PROVIDER_SITE_OTHER): Payer: Medicare Other | Admitting: *Deleted

## 2011-04-01 DIAGNOSIS — I4891 Unspecified atrial fibrillation: Secondary | ICD-10-CM

## 2011-04-01 DIAGNOSIS — Z7901 Long term (current) use of anticoagulants: Secondary | ICD-10-CM

## 2011-04-01 LAB — POCT INR: INR: 2.9

## 2011-04-29 ENCOUNTER — Ambulatory Visit (INDEPENDENT_AMBULATORY_CARE_PROVIDER_SITE_OTHER): Payer: Medicare Other | Admitting: *Deleted

## 2011-04-29 ENCOUNTER — Telehealth: Payer: Self-pay | Admitting: Cardiology

## 2011-04-29 DIAGNOSIS — I4891 Unspecified atrial fibrillation: Secondary | ICD-10-CM

## 2011-04-29 DIAGNOSIS — I6350 Cerebral infarction due to unspecified occlusion or stenosis of unspecified cerebral artery: Secondary | ICD-10-CM

## 2011-04-29 DIAGNOSIS — I639 Cerebral infarction, unspecified: Secondary | ICD-10-CM

## 2011-04-29 DIAGNOSIS — Z7901 Long term (current) use of anticoagulants: Secondary | ICD-10-CM

## 2011-04-29 NOTE — Telephone Encounter (Signed)
Patient states that "Tramadol is not working".  She states that Dr.Rothbart did prescribe this for her. / tg

## 2011-04-29 NOTE — Telephone Encounter (Signed)
Discussed with Dr Dietrich Pates  Patient can take [name] OTC 1-2 three times a day as needed for pain  Tried to call patient back to let her know but did not get an answer

## 2011-04-30 NOTE — Telephone Encounter (Signed)
Aleve 1-2 tabs TID PRN.  If ineffective, take both aleve and ultracet. Contact PMD for additional assistance with pain issues.

## 2011-05-02 LAB — COMPREHENSIVE METABOLIC PANEL
ALT: 23
AST: 26
Alkaline Phosphatase: 83
CO2: 27
Calcium: 9
Chloride: 103
GFR calc Af Amer: >60
GFR calc non Af Amer: >60
Glucose, Bld: 96
Potassium: 4.3
Sodium: 137
Total Bilirubin: 0.5

## 2011-05-08 LAB — COMPREHENSIVE METABOLIC PANEL
ALT: 13
AST: 17
Albumin: 3.2 — ABNORMAL LOW
Alkaline Phosphatase: 79
BUN: 22
CO2: 27
Chloride: 105
Creatinine, Ser: 0.85
GFR calc Af Amer: >60
GFR calc non Af Amer: >60
Glucose, Bld: 131 — ABNORMAL HIGH
Potassium: 4.6
Potassium: 4.1
Total Bilirubin: 0.5
Total Bilirubin: 0.5
Total Protein: 6.3
Total Protein: 6.1

## 2011-05-13 LAB — DIFFERENTIAL
Basophils Absolute: 0
Basophils Relative: 0
Eosinophils Relative: 2
Monocytes Absolute: 0.9
Monocytes Relative: 11

## 2011-05-13 LAB — COMPREHENSIVE METABOLIC PANEL
ALT: 12
AST: 18
Albumin: 3.9
Alkaline Phosphatase: 81
Chloride: 105
GFR calc Af Amer: >60
Potassium: 4.5
Sodium: 136
Total Bilirubin: 0.7
Total Protein: 6.6

## 2011-05-13 LAB — CBC
HCT: 38.1
Platelets: 252
RDW: 18.5 — ABNORMAL HIGH
WBC: 8

## 2011-05-13 LAB — CANCER ANTIGEN 27.29: CA 27.29: 23

## 2011-05-15 ENCOUNTER — Other Ambulatory Visit (HOSPITAL_COMMUNITY): Payer: Self-pay | Admitting: Oncology

## 2011-05-15 DIAGNOSIS — Z139 Encounter for screening, unspecified: Secondary | ICD-10-CM

## 2011-05-23 LAB — COMPREHENSIVE METABOLIC PANEL
Albumin: 3.3 — ABNORMAL LOW
Alkaline Phosphatase: 65
BUN: 23
Calcium: 8.8
Glucose, Bld: 133 — ABNORMAL HIGH
Potassium: 4.2
Sodium: 138
Total Protein: 6.2

## 2011-05-23 LAB — CBC
HCT: 37
Hemoglobin: 12.3
MCHC: 33.1
Platelets: 223
RDW: 16.8 — ABNORMAL HIGH

## 2011-05-23 LAB — FERRITIN: Ferritin: 38 (ref 10–291)

## 2011-05-27 ENCOUNTER — Ambulatory Visit (INDEPENDENT_AMBULATORY_CARE_PROVIDER_SITE_OTHER): Payer: Medicare Other | Admitting: *Deleted

## 2011-05-27 DIAGNOSIS — Z7901 Long term (current) use of anticoagulants: Secondary | ICD-10-CM

## 2011-05-27 DIAGNOSIS — I4891 Unspecified atrial fibrillation: Secondary | ICD-10-CM

## 2011-05-27 DIAGNOSIS — I6350 Cerebral infarction due to unspecified occlusion or stenosis of unspecified cerebral artery: Secondary | ICD-10-CM

## 2011-05-27 DIAGNOSIS — I639 Cerebral infarction, unspecified: Secondary | ICD-10-CM

## 2011-05-27 LAB — POCT INR: INR: 2.7

## 2011-06-12 ENCOUNTER — Encounter (HOSPITAL_COMMUNITY): Payer: Medicare Other | Attending: Oncology | Admitting: Oncology

## 2011-06-12 ENCOUNTER — Encounter (HOSPITAL_COMMUNITY): Payer: Self-pay | Admitting: Oncology

## 2011-06-12 VITALS — BP 156/68 | HR 68 | Temp 98.1°F | Wt 140.1 lb

## 2011-06-12 DIAGNOSIS — M810 Age-related osteoporosis without current pathological fracture: Secondary | ICD-10-CM

## 2011-06-12 DIAGNOSIS — Z853 Personal history of malignant neoplasm of breast: Secondary | ICD-10-CM

## 2011-06-12 DIAGNOSIS — E119 Type 2 diabetes mellitus without complications: Secondary | ICD-10-CM

## 2011-06-12 DIAGNOSIS — I10 Essential (primary) hypertension: Secondary | ICD-10-CM

## 2011-06-12 DIAGNOSIS — C50919 Malignant neoplasm of unspecified site of unspecified female breast: Secondary | ICD-10-CM

## 2011-06-12 NOTE — Progress Notes (Signed)
This office note has been dictated.

## 2011-06-12 NOTE — Patient Instructions (Signed)
[facility]  Discharge Instructions  RECOMMENDATIONS MADE BY THE CONSULTANT AND ANY TEST RESULTS WILL BE SENT TO YOUR REFERRING DOCTOR.   EXAM FINDINGS BY MD TODAY AND SIGNS AND SYMPTOMS TO REPORT TO CLINIC OR PRIMARY MD: You are doing well.  No evidence of recurrence.  Having mammography is optional at this point per Dr. Mariel Sleet.  MEDICATIONS PRESCRIBED: none   INSTRUCTIONS GIVEN AND DISCUSSED: Other :  Report any new lumps, bone pain or shortness of breath.  SPECIAL INSTRUCTIONS/FOLLOW-UP: Lab work Needed in January with Zometa and return to see Dr. Mariel Sleet in 1 year.   I acknowledge that I have been informed and understand all the instructions given to me and received a copy. I do not have any more questions at this time, but understand that I may call the Specialty Clinic at [facility] at [phone] during business hours should I have any further questions or need assistance in obtaining follow-up care.    __________________________________________  _____________  __________ Signature of Patient or Authorized Representative            Date                   Time    __________________________________________ Nurse's Signature

## 2011-06-13 NOTE — Progress Notes (Signed)
CC:   [name], M.D. [facility]. Dietrich Pates, MD, [facility]  DIAGNOSIS: 1. Stage I adenocarcinoma of the left breast (T1c N0) status post     modified radical mastectomy in July 1995 for a 1.4 cm cancer.     Nodes were negative.  She had surgery followed by tamoxifen for 5     years.  ER receptors were negative, but PR receptors were 30%. 2. Atrial fibrillation on Coumadin. 3. Osteoporosis on Zometa, calcium and vitamin D; the Zometa is once a     year. 4. Hypertension. 5. Diabetes mellitus. 6. Chronic left foot pain. 7. Left lower leg edema from venous insufficiency. 8. Left hip fracture in October 2007, repaired by Dr. Fuller Canada. 9. HISTORY OF ROSEMARY-INDUCED URTICARIA. 10.Chronic constipation which is definitely better. 11.Hypothyroidism on replacement therapy. [name] has blood work from Dr. Dietrich Pates, most recently it was done on March 18, 2011.  It was actually quite good for everything that she has going on.  She has control of her INR by Dr. Dietrich Pates.  She is still living in her own place, but her daughter and son-in-law shop for her food-wise and bring her food, etc.  She is able to clean very little.  She has a little help with a cleaning person a couple times a week.  [name] is still having mammography, but I told her I think it is absolutely just optional at this juncture, as she is now 77 years old.  She is doing well with her bowels at this time.  She is not short of breath, does not have any chest pain, not aware of any nodules.  She has once in a great while a little discomfort in the left chest wall.  PHYSICAL EXAMINATION:  General:  She is, of course, older than she has ever been at age 45.  Vital Signs:  She is afebrile; 140 pounds; blood pressure 156/68, right arm, sitting position; pulse 68 and irregular; she is afebrile.  She is in no acute distress.  She denies any pain presently.  Breasts:  The left chest wall is clear.  The right breast is negative.   Lymphatic:  She has no adenopathy.  Heart:  Does reveal a grade 1/6 to 2/6 systolic murmur, without S3 gallop that I can appreciate.  Lungs:  Clear, as I mentioned.  Abdomen:  Obese, nontender without organomegaly.  Extremities:  She does not have any ankle edema presently.  So I think she is doing as well as she can be for age 47, and we will see her back in a year.  We will see her back in January for labs and her Zometa.   ______________________________ [name]. [name] Sleet, MD ESN/MEDQ  D:  06/12/2011  T:  06/13/2011  Job:  [id number]

## 2011-06-17 ENCOUNTER — Ambulatory Visit (HOSPITAL_COMMUNITY): Payer: Medicare Other

## 2011-07-08 ENCOUNTER — Ambulatory Visit (INDEPENDENT_AMBULATORY_CARE_PROVIDER_SITE_OTHER): Payer: Medicare Other | Admitting: *Deleted

## 2011-07-08 DIAGNOSIS — I6350 Cerebral infarction due to unspecified occlusion or stenosis of unspecified cerebral artery: Secondary | ICD-10-CM

## 2011-07-08 DIAGNOSIS — Z7901 Long term (current) use of anticoagulants: Secondary | ICD-10-CM

## 2011-07-08 DIAGNOSIS — I639 Cerebral infarction, unspecified: Secondary | ICD-10-CM

## 2011-07-08 DIAGNOSIS — I4891 Unspecified atrial fibrillation: Secondary | ICD-10-CM

## 2011-07-22 ENCOUNTER — Other Ambulatory Visit (HOSPITAL_COMMUNITY): Payer: Self-pay | Admitting: Oncology

## 2011-08-08 ENCOUNTER — Other Ambulatory Visit: Payer: Self-pay

## 2011-08-08 ENCOUNTER — Encounter (HOSPITAL_COMMUNITY): Payer: Self-pay

## 2011-08-08 ENCOUNTER — Emergency Department (HOSPITAL_COMMUNITY)
Admission: EM | Admit: 2011-08-08 | Discharge: 2011-08-08 | Disposition: A | Discharge status: Home / Self Care | Payer: Medicare Other | Attending: Emergency Medicine | Admitting: Emergency Medicine

## 2011-08-08 ENCOUNTER — Emergency Department (HOSPITAL_COMMUNITY): Payer: Medicare Other

## 2011-08-08 DIAGNOSIS — B029 Zoster without complications: Secondary | ICD-10-CM | POA: Insufficient documentation

## 2011-08-08 DIAGNOSIS — Z79899 Other long term (current) drug therapy: Secondary | ICD-10-CM | POA: Insufficient documentation

## 2011-08-08 DIAGNOSIS — M810 Age-related osteoporosis without current pathological fracture: Secondary | ICD-10-CM | POA: Insufficient documentation

## 2011-08-08 DIAGNOSIS — I059 Rheumatic mitral valve disease, unspecified: Secondary | ICD-10-CM | POA: Insufficient documentation

## 2011-08-08 DIAGNOSIS — Z9849 Cataract extraction status, unspecified eye: Secondary | ICD-10-CM | POA: Insufficient documentation

## 2011-08-08 DIAGNOSIS — M79609 Pain in unspecified limb: Secondary | ICD-10-CM | POA: Insufficient documentation

## 2011-08-08 DIAGNOSIS — I2510 Atherosclerotic heart disease of native coronary artery without angina pectoris: Secondary | ICD-10-CM | POA: Insufficient documentation

## 2011-08-08 DIAGNOSIS — E119 Type 2 diabetes mellitus without complications: Secondary | ICD-10-CM | POA: Insufficient documentation

## 2011-08-08 DIAGNOSIS — Z7982 Long term (current) use of aspirin: Secondary | ICD-10-CM | POA: Insufficient documentation

## 2011-08-08 DIAGNOSIS — Z8673 Personal history of transient ischemic attack (TIA), and cerebral infarction without residual deficits: Secondary | ICD-10-CM | POA: Insufficient documentation

## 2011-08-08 DIAGNOSIS — Z7901 Long term (current) use of anticoagulants: Secondary | ICD-10-CM | POA: Insufficient documentation

## 2011-08-08 DIAGNOSIS — Z9079 Acquired absence of other genital organ(s): Secondary | ICD-10-CM | POA: Insufficient documentation

## 2011-08-08 DIAGNOSIS — I4891 Unspecified atrial fibrillation: Secondary | ICD-10-CM | POA: Insufficient documentation

## 2011-08-08 DIAGNOSIS — Z853 Personal history of malignant neoplasm of breast: Secondary | ICD-10-CM | POA: Insufficient documentation

## 2011-08-08 DIAGNOSIS — I10 Essential (primary) hypertension: Secondary | ICD-10-CM | POA: Insufficient documentation

## 2011-08-08 DIAGNOSIS — I739 Peripheral vascular disease, unspecified: Secondary | ICD-10-CM | POA: Insufficient documentation

## 2011-08-08 DIAGNOSIS — R609 Edema, unspecified: Secondary | ICD-10-CM | POA: Insufficient documentation

## 2011-08-08 DIAGNOSIS — E039 Hypothyroidism, unspecified: Secondary | ICD-10-CM | POA: Insufficient documentation

## 2011-08-08 DIAGNOSIS — Z9010 Acquired absence of unspecified breast and nipple: Secondary | ICD-10-CM | POA: Insufficient documentation

## 2011-08-08 NOTE — ED Notes (Signed)
Patient would like something to drink at this time. 

## 2011-08-08 NOTE — ED Notes (Signed)
Pt reports swelling to bilateral lower extremities.  Reports has had swelling in left leg x 1 year but r leg started swelling approx a week ago.  Denies any SOB.

## 2011-08-08 NOTE — ED Provider Notes (Signed)
History   Scribed for Benny Lennert, MD, the patient was seen in room APA04/APA04 . This chart was scribed by Lewanda Rife.   CSN: [id number]  Arrival date & time 08/08/11  1357   First MD Initiated Contact with Patient 08/08/11 1515      Chief Complaint  Patient presents with  . Leg Swelling    (Consider location/radiation/quality/duration/timing/severity/associated sxs/prior treatment) HPI  Past Medical History  Diagnosis Date  . Hypertension   . Carcinoma of breast     left masectomy in 1995  . Atrial fibrillation 2007    CHF with preserved LV systolic function - 2008; moderate LVH  . Arteriosclerotic cardiovascular disease (ASCVD)     cath in 2001- 40% LAD and 50% RCA; stent for 80% LAD in 5/04; residual 50% LAD, 80% small D1, 70% small OM1 50% ostial RCA, nl EF   . MVP (mitral valve prolapse)     moderate; with moderate MR  . PVD (peripheral vascular disease)     ABIs of 0.64 and 0.59, right and left leg in 2009  . CVD (cerebrovascular disease)     plaque w/o focal disease in 2006; h/o CVA  . Diabetes mellitus type II     no insulin   . Hypothyroid   . Osteoporosis   . Edema   . Herpes zoster   . Chronic hoarseness   . Bronchitis     history    Past Surgical History  Procedure Date  . Orif of left hip 05/22/06    Romeo Apple  . Left masectomy 1995  . Cholecystectomy 2006  . Abdominal hysterectomy     abd?  . Cataract extraction, bilateral     No family history on file.  History  Substance Use Topics  . Smoking status: Never Smoker   . Smokeless tobacco: Never Used   Comment: tobacco use - no   . Alcohol Use: No    OB History    Grav Para Term Preterm Abortions TAB SAB Ect Mult Living                  Review of Systems  Allergies  Review of patient's allergies indicates no known allergies.  Home Medications   Current Outpatient Rx  Name Route Sig Dispense Refill  . ASPIRIN 81 MG PO TBEC Oral Take 81 mg by mouth every morning.      Marland Kitchen DILTIAZEM HCL ER COATED BEADS 240 MG PO CP24 Oral Take 240 mg by mouth at bedtime.     Marland Kitchen GLIPIZIDE 5 MG PO TABS Oral Take 5 mg by mouth daily.     Marland Kitchen LEVOTHYROXINE SODIUM 25 MCG PO TABS Oral Take 25 mcg by mouth every morning.     Marland Kitchen RALOXIFENE HCL 60 MG PO TABS       . TRAMADOL-ACETAMINOPHEN 37.5-325 MG PO TABS Oral Take 1 tablet by mouth every 6 (six) hours as needed. For pain     . WARFARIN SODIUM 2 MG PO TABS Oral Take 2-4 mg by mouth as directed. Takes 4 mg on Mondays and Fridays and 2 mg the rest of the week. **Takes at bedtime**    . CALCIUM CARBONATE-VITAMIN D 500-200 MG-UNIT PO TABS Oral Take 1 tablet by mouth daily.      . COD LIVER OIL PO CAPS Oral Take 1 capsule by mouth daily.      Marland Kitchen DIGOXIN PO Oral Take by mouth daily. Daily with lunch     . FEXOFENADINE HCL 180  MG PO TABS Oral Take 180 mg by mouth daily.      . FUROSEMIDE 20 MG PO TABS Oral Take 20 mg by mouth daily.      Marland Kitchen LOSARTAN POTASSIUM 50 MG PO TABS Oral Take 50 mg by mouth daily.      Marland Kitchen METFORMIN HCL 500 MG PO TABS Oral Take 500 mg by mouth 2 (two) times daily with a meal.      . CENTRAL-VITE PO Oral Take by mouth at bedtime.      . ICAPS PLUS PO TABS Oral Take 1 tablet by mouth daily.      . NEBIVOLOL HCL 10 MG PO TABS Oral Take 10 mg by mouth daily.      Marland Kitchen OMEPRAZOLE 20 MG PO CPDR Oral Take 20 mg by mouth 2 (two) times daily.      Marland Kitchen POTASSIUM CHLORIDE 20 MEQ/15ML (10%) PO LIQD Oral Take by mouth daily.     Marland Kitchen POTASSIUM CHLORIDE CRYS CR 20 MEQ PO TBCR Oral Take 20 mEq by mouth daily.      Marland Kitchen ROSUVASTATIN CALCIUM 10 MG PO TABS Oral Take 10 mg by mouth at bedtime as needed and may repeat dose one time if needed.        BP 183/57  Pulse 143  Temp(Src) 98.2 F (36.8 C) (Oral)  Resp 18  Ht 5' (1.524 m)  Wt 140 lb (63.504 kg)  BMI 27.34 kg/m2  SpO2 96%  Physical Exam  ED Course  Procedures (including critical care time)  Labs Reviewed - No data to display No results found.   1. Edema     Date: 08/08/2011   Rate:70  Rhythm: atrial fibrillation  QRS Axis: normal  Intervals: normal  ST/T Wave abnormalities: nonspecific ST changes  Conduction Disutrbances:none  Narrative Interpretation:   Old EKG Reviewed: none available     MDM   The chart was scribed for me under my direct supervision.  I personally performed the history, physical, and medical decision making and all procedures in the evaluation of this patient.Marland Kitchen     DIAGNOSTIC STUDIES:   COORDINATION OF CARE:      Benny Lennert, MD 08/08/11 [phone]

## 2011-08-15 DIAGNOSIS — R0602 Shortness of breath: Secondary | ICD-10-CM | POA: Diagnosis not present

## 2011-08-15 DIAGNOSIS — R609 Edema, unspecified: Secondary | ICD-10-CM | POA: Diagnosis not present

## 2011-08-19 ENCOUNTER — Encounter (HOSPITAL_COMMUNITY): Payer: Medicare Other | Attending: Oncology

## 2011-08-19 ENCOUNTER — Ambulatory Visit (INDEPENDENT_AMBULATORY_CARE_PROVIDER_SITE_OTHER): Payer: Medicare Other | Admitting: *Deleted

## 2011-08-19 DIAGNOSIS — I4891 Unspecified atrial fibrillation: Secondary | ICD-10-CM | POA: Diagnosis not present

## 2011-08-19 DIAGNOSIS — I6350 Cerebral infarction due to unspecified occlusion or stenosis of unspecified cerebral artery: Secondary | ICD-10-CM | POA: Diagnosis not present

## 2011-08-19 DIAGNOSIS — Z7901 Long term (current) use of anticoagulants: Secondary | ICD-10-CM

## 2011-08-19 DIAGNOSIS — Z853 Personal history of malignant neoplasm of breast: Secondary | ICD-10-CM

## 2011-08-19 DIAGNOSIS — M810 Age-related osteoporosis without current pathological fracture: Secondary | ICD-10-CM | POA: Insufficient documentation

## 2011-08-19 DIAGNOSIS — C50919 Malignant neoplasm of unspecified site of unspecified female breast: Secondary | ICD-10-CM | POA: Insufficient documentation

## 2011-08-19 DIAGNOSIS — I639 Cerebral infarction, unspecified: Secondary | ICD-10-CM

## 2011-08-19 LAB — CBC
Hemoglobin: 13.4 g/dL (ref 12.0–15.0)
Platelets: 265 10*3/uL (ref 150–400)
RBC: 4.55 MIL/uL (ref 3.87–5.11)
WBC: 9.9 10*3/uL (ref 4.0–10.5)

## 2011-08-19 LAB — COMPREHENSIVE METABOLIC PANEL
ALT: 17 U/L (ref 0–35)
AST: 16 U/L (ref 0–37)
Alkaline Phosphatase: 65 U/L (ref 39–117)
CO2: 28 mEq/L (ref 19–32)
Chloride: 101 mEq/L (ref 96–112)
GFR calc Af Amer: 63 mL/min — ABNORMAL LOW (ref >90)
GFR calc non Af Amer: 54 mL/min — ABNORMAL LOW (ref >90)
Glucose, Bld: 209 mg/dL — ABNORMAL HIGH (ref 70–99)
Potassium: 4.4 mEq/L (ref 3.5–5.1)
Sodium: 136 mEq/L (ref 135–145)
Total Bilirubin: 0.4 mg/dL (ref 0.3–1.2)

## 2011-08-19 NOTE — Progress Notes (Signed)
Labs drawn today for cbc,cmp 

## 2011-08-20 ENCOUNTER — Emergency Department (HOSPITAL_COMMUNITY): Payer: Medicare Other

## 2011-08-20 ENCOUNTER — Inpatient Hospital Stay (HOSPITAL_COMMUNITY)
Admission: EM | Admit: 2011-08-20 | Discharge: 2011-08-22 | DRG: 554 | Disposition: A | Discharge status: Home / Self Care | Payer: Medicare Other | Attending: Internal Medicine | Admitting: Internal Medicine

## 2011-08-20 ENCOUNTER — Encounter (HOSPITAL_COMMUNITY): Payer: Self-pay

## 2011-08-20 ENCOUNTER — Other Ambulatory Visit: Payer: Self-pay

## 2011-08-20 DIAGNOSIS — I8390 Asymptomatic varicose veins of unspecified lower extremity: Secondary | ICD-10-CM | POA: Diagnosis present

## 2011-08-20 DIAGNOSIS — M1710 Unilateral primary osteoarthritis, unspecified knee: Secondary | ICD-10-CM | POA: Diagnosis present

## 2011-08-20 DIAGNOSIS — R609 Edema, unspecified: Secondary | ICD-10-CM | POA: Diagnosis not present

## 2011-08-20 DIAGNOSIS — I509 Heart failure, unspecified: Secondary | ICD-10-CM | POA: Diagnosis present

## 2011-08-20 DIAGNOSIS — E039 Hypothyroidism, unspecified: Secondary | ICD-10-CM | POA: Diagnosis not present

## 2011-08-20 DIAGNOSIS — I5033 Acute on chronic diastolic (congestive) heart failure: Secondary | ICD-10-CM | POA: Diagnosis present

## 2011-08-20 DIAGNOSIS — C50919 Malignant neoplasm of unspecified site of unspecified female breast: Secondary | ICD-10-CM

## 2011-08-20 DIAGNOSIS — IMO0002 Reserved for concepts with insufficient information to code with codable children: Principal | ICD-10-CM | POA: Diagnosis present

## 2011-08-20 DIAGNOSIS — I10 Essential (primary) hypertension: Secondary | ICD-10-CM | POA: Diagnosis present

## 2011-08-20 DIAGNOSIS — I8393 Asymptomatic varicose veins of bilateral lower extremities: Secondary | ICD-10-CM | POA: Diagnosis present

## 2011-08-20 DIAGNOSIS — M25461 Effusion, right knee: Secondary | ICD-10-CM | POA: Diagnosis present

## 2011-08-20 DIAGNOSIS — M1711 Unilateral primary osteoarthritis, right knee: Secondary | ICD-10-CM | POA: Diagnosis present

## 2011-08-20 DIAGNOSIS — I498 Other specified cardiac arrhythmias: Secondary | ICD-10-CM | POA: Diagnosis present

## 2011-08-20 DIAGNOSIS — I517 Cardiomegaly: Secondary | ICD-10-CM | POA: Diagnosis not present

## 2011-08-20 DIAGNOSIS — E119 Type 2 diabetes mellitus without complications: Secondary | ICD-10-CM | POA: Diagnosis present

## 2011-08-20 DIAGNOSIS — R001 Bradycardia, unspecified: Secondary | ICD-10-CM | POA: Diagnosis present

## 2011-08-20 DIAGNOSIS — I5032 Chronic diastolic (congestive) heart failure: Secondary | ICD-10-CM | POA: Diagnosis present

## 2011-08-20 DIAGNOSIS — Z853 Personal history of malignant neoplasm of breast: Secondary | ICD-10-CM

## 2011-08-20 DIAGNOSIS — I4891 Unspecified atrial fibrillation: Secondary | ICD-10-CM | POA: Diagnosis present

## 2011-08-20 DIAGNOSIS — D649 Anemia, unspecified: Secondary | ICD-10-CM | POA: Diagnosis not present

## 2011-08-20 DIAGNOSIS — Z7901 Long term (current) use of anticoagulants: Secondary | ICD-10-CM

## 2011-08-20 DIAGNOSIS — R600 Localized edema: Secondary | ICD-10-CM | POA: Diagnosis present

## 2011-08-20 DIAGNOSIS — M25869 Other specified joint disorders, unspecified knee: Secondary | ICD-10-CM | POA: Diagnosis not present

## 2011-08-20 DIAGNOSIS — R079 Chest pain, unspecified: Secondary | ICD-10-CM | POA: Diagnosis not present

## 2011-08-20 DIAGNOSIS — M2540 Effusion, unspecified joint: Secondary | ICD-10-CM | POA: Diagnosis not present

## 2011-08-20 DIAGNOSIS — M25561 Pain in right knee: Secondary | ICD-10-CM | POA: Diagnosis present

## 2011-08-20 DIAGNOSIS — M79609 Pain in unspecified limb: Secondary | ICD-10-CM | POA: Diagnosis not present

## 2011-08-20 DIAGNOSIS — M25569 Pain in unspecified knee: Secondary | ICD-10-CM | POA: Diagnosis not present

## 2011-08-20 DIAGNOSIS — M25469 Effusion, unspecified knee: Secondary | ICD-10-CM | POA: Diagnosis not present

## 2011-08-20 HISTORY — DX: Asymptomatic varicose veins of bilateral lower extremities: I83.93

## 2011-08-20 HISTORY — DX: Unilateral primary osteoarthritis, right knee: M17.11

## 2011-08-20 HISTORY — DX: Localized edema: R60.0

## 2011-08-20 HISTORY — DX: Chronic diastolic (congestive) heart failure: I50.32

## 2011-08-20 LAB — BASIC METABOLIC PANEL
BUN: 30 mg/dL — ABNORMAL HIGH (ref 6–23)
Chloride: 103 mEq/L (ref 96–112)
GFR calc Af Amer: 84 mL/min — ABNORMAL LOW (ref >90)
GFR calc non Af Amer: 73 mL/min — ABNORMAL LOW (ref >90)
Glucose, Bld: 182 mg/dL — ABNORMAL HIGH (ref 70–99)
Potassium: 4.4 mEq/L (ref 3.5–5.1)
Sodium: 137 mEq/L (ref 135–145)

## 2011-08-20 LAB — CBC
Hemoglobin: 12.7 g/dL (ref 12.0–15.0)
MCH: 29.4 pg (ref 26.0–34.0)
Platelets: 233 10*3/uL (ref 150–400)
RBC: 4.32 MIL/uL (ref 3.87–5.11)
WBC: 10.3 10*3/uL (ref 4.0–10.5)

## 2011-08-20 LAB — DIFFERENTIAL
Eosinophils Absolute: 0.1 10*3/uL (ref 0.0–0.7)
Lymphs Abs: 1.4 10*3/uL (ref 0.7–4.0)
Monocytes Relative: 7 % (ref 3–12)
Neutro Abs: 8.1 10*3/uL — ABNORMAL HIGH (ref 1.7–7.7)
Neutrophils Relative %: 78 % — ABNORMAL HIGH (ref 43–77)

## 2011-08-20 LAB — GLUCOSE, CAPILLARY
Glucose-Capillary: 135 mg/dL — ABNORMAL HIGH (ref 70–99)
Glucose-Capillary: 141 mg/dL — ABNORMAL HIGH (ref 70–99)

## 2011-08-20 LAB — CARDIAC PANEL(CRET KIN+CKTOT+MB+TROPI)
CK, MB: 3.3 ng/mL (ref 0.3–4.0)
Relative Index: INVALID (ref 0.0–2.5)
Total CK: 51 U/L (ref 7–177)

## 2011-08-20 LAB — PROTIME-INR: INR: 2.87 — ABNORMAL HIGH (ref 0.00–1.49)

## 2011-08-20 MED ORDER — NEBIVOLOL HCL 10 MG PO TABS
10.0000 mg | ORAL_TABLET | Freq: Every day | ORAL | Status: DC
  Administered 2011-08-21 – 2011-08-22 (×2): 10 mg via ORAL
  Filled 2011-08-20 (×4): qty 1

## 2011-08-20 MED ORDER — INSULIN ASPART 100 UNIT/ML ~~LOC~~ SOLN
0.0000 [IU] | Freq: Three times a day (TID) | SUBCUTANEOUS | Status: DC
  Administered 2011-08-21 (×2): 2 [IU] via SUBCUTANEOUS
  Administered 2011-08-22 (×2): 1 [IU] via SUBCUTANEOUS
  Filled 2011-08-20: qty 3

## 2011-08-20 MED ORDER — METFORMIN HCL 500 MG PO TABS
500.0000 mg | ORAL_TABLET | Freq: Two times a day (BID) | ORAL | Status: DC
  Administered 2011-08-20 – 2011-08-22 (×4): 500 mg via ORAL
  Filled 2011-08-20 (×4): qty 1

## 2011-08-20 MED ORDER — DOCUSATE SODIUM 100 MG PO CAPS
100.0000 mg | ORAL_CAPSULE | Freq: Two times a day (BID) | ORAL | Status: DC
  Administered 2011-08-20 – 2011-08-22 (×4): 100 mg via ORAL
  Filled 2011-08-20 (×4): qty 1

## 2011-08-20 MED ORDER — POTASSIUM CHLORIDE CRYS ER 20 MEQ PO TBCR
20.0000 meq | EXTENDED_RELEASE_TABLET | Freq: Every day | ORAL | Status: DC
  Administered 2011-08-21 – 2011-08-22 (×2): 20 meq via ORAL
  Filled 2011-08-20 (×3): qty 1

## 2011-08-20 MED ORDER — INSULIN ASPART 100 UNIT/ML ~~LOC~~ SOLN
0.0000 [IU] | Freq: Every day | SUBCUTANEOUS | Status: DC
  Administered 2011-08-21: 2 [IU] via SUBCUTANEOUS

## 2011-08-20 MED ORDER — TRAMADOL-ACETAMINOPHEN 37.5-325 MG PO TABS: 1.0000 | ORAL_TABLET | Freq: Four times a day (QID) | ORAL | Status: DC | PRN

## 2011-08-20 MED ORDER — LEVALBUTEROL HCL 0.63 MG/3ML IN NEBU: 0.6300 mg | INHALATION_SOLUTION | Freq: Four times a day (QID) | RESPIRATORY_TRACT | Status: DC | PRN

## 2011-08-20 MED ORDER — PANTOPRAZOLE SODIUM 40 MG PO TBEC
40.0000 mg | DELAYED_RELEASE_TABLET | Freq: Every day | ORAL | Status: DC
  Administered 2011-08-20 – 2011-08-22 (×3): 40 mg via ORAL
  Filled 2011-08-20 (×3): qty 1

## 2011-08-20 MED ORDER — LOSARTAN POTASSIUM 50 MG PO TABS
50.0000 mg | ORAL_TABLET | Freq: Every day | ORAL | Status: DC
  Administered 2011-08-21 – 2011-08-22 (×2): 50 mg via ORAL
  Filled 2011-08-20 (×2): qty 1

## 2011-08-20 MED ORDER — DILTIAZEM HCL ER COATED BEADS 240 MG PO CP24
240.0000 mg | ORAL_CAPSULE | Freq: Every day | ORAL | Status: DC
  Administered 2011-08-20 – 2011-08-21 (×2): 240 mg via ORAL
  Filled 2011-08-20 (×2): qty 1

## 2011-08-20 MED ORDER — FUROSEMIDE 40 MG PO TABS
20.0000 mg | ORAL_TABLET | Freq: Every day | ORAL | Status: DC
  Administered 2011-08-21 – 2011-08-22 (×2): 20 mg via ORAL
  Filled 2011-08-20 (×2): qty 1

## 2011-08-20 MED ORDER — CALCIUM CARBONATE-VITAMIN D 500-200 MG-UNIT PO TABS
1.0000 | ORAL_TABLET | Freq: Every day | ORAL | Status: DC
  Administered 2011-08-21 – 2011-08-22 (×2): 1 via ORAL
  Filled 2011-08-20 (×2): qty 1

## 2011-08-20 MED ORDER — SODIUM CHLORIDE 0.9 % IV SOLN: INTRAVENOUS | Status: DC

## 2011-08-20 MED ORDER — ONDANSETRON HCL 4 MG/2ML IJ SOLN
4.0000 mg | Freq: Once | INTRAMUSCULAR | Status: AC
  Administered 2011-08-20: 4 mg via INTRAVENOUS
  Filled 2011-08-20: qty 2

## 2011-08-20 MED ORDER — DIGOXIN 250 MCG PO TABS
125.0000 ug | ORAL_TABLET | Freq: Every day | ORAL | Status: DC
  Administered 2011-08-21 – 2011-08-22 (×2): 125 ug via ORAL
  Filled 2011-08-20 (×2): qty 1

## 2011-08-20 MED ORDER — PREDNISONE 10 MG PO TABS
10.0000 mg | ORAL_TABLET | Freq: Two times a day (BID) | ORAL | Status: DC
  Administered 2011-08-21 – 2011-08-22 (×3): 10 mg via ORAL
  Filled 2011-08-20 (×3): qty 1

## 2011-08-20 MED ORDER — ROSUVASTATIN CALCIUM 5 MG PO TABS: 10.0000 mg | ORAL_TABLET | Freq: Every evening | ORAL | Status: DC | PRN

## 2011-08-20 MED ORDER — MORPHINE SULFATE 2 MG/ML IJ SOLN: 2.0000 mg | INTRAMUSCULAR | Status: DC | PRN

## 2011-08-20 MED ORDER — POTASSIUM CHLORIDE IN NACL 20-0.9 MEQ/L-% IV SOLN
INTRAVENOUS | Status: DC
  Administered 2011-08-20: 19:00:00 via INTRAVENOUS

## 2011-08-20 MED ORDER — ASPIRIN EC 81 MG PO TBEC
81.0000 mg | DELAYED_RELEASE_TABLET | ORAL | Status: DC
  Administered 2011-08-21 – 2011-08-22 (×2): 81 mg via ORAL
  Filled 2011-08-20 (×2): qty 1

## 2011-08-20 MED ORDER — SODIUM CHLORIDE 0.9 % IV SOLN
INTRAVENOUS | Status: DC
  Administered 2011-08-20: 13:00:00 via INTRAVENOUS

## 2011-08-20 MED ORDER — MORPHINE SULFATE 4 MG/ML IJ SOLN
4.0000 mg | Freq: Once | INTRAMUSCULAR | Status: AC
  Administered 2011-08-20: 4 mg via INTRAVENOUS
  Filled 2011-08-20: qty 1

## 2011-08-20 MED ORDER — ACETAMINOPHEN 325 MG PO TABS
650.0000 mg | ORAL_TABLET | Freq: Two times a day (BID) | ORAL | Status: DC
  Administered 2011-08-20 – 2011-08-22 (×4): 650 mg via ORAL
  Filled 2011-08-20 (×4): qty 2

## 2011-08-20 MED ORDER — ONDANSETRON HCL 4 MG/2ML IJ SOLN: 4.0000 mg | Freq: Four times a day (QID) | INTRAMUSCULAR | Status: DC | PRN

## 2011-08-20 MED ORDER — ONDANSETRON HCL 4 MG PO TABS: 4.0000 mg | ORAL_TABLET | Freq: Four times a day (QID) | ORAL | Status: DC | PRN

## 2011-08-20 MED ORDER — WARFARIN SODIUM 2 MG PO TABS
2.0000 mg | ORAL_TABLET | Freq: Once | ORAL | Status: AC
  Administered 2011-08-20: 2 mg via ORAL
  Filled 2011-08-20: qty 1

## 2011-08-20 MED ORDER — LEVOTHYROXINE SODIUM 25 MCG PO TABS
25.0000 ug | ORAL_TABLET | ORAL | Status: DC
  Administered 2011-08-21 – 2011-08-22 (×2): 25 ug via ORAL
  Filled 2011-08-20 (×2): qty 1

## 2011-08-20 MED ORDER — OCUVITE PO TABS
1.0000 | ORAL_TABLET | Freq: Every day | ORAL | Status: DC
  Administered 2011-08-21 – 2011-08-22 (×2): 1 via ORAL
  Filled 2011-08-20 (×4): qty 1

## 2011-08-20 MED ORDER — GLIPIZIDE 5 MG PO TABS
5.0000 mg | ORAL_TABLET | Freq: Every day | ORAL | Status: DC
  Filled 2011-08-20: qty 1

## 2011-08-20 MED ORDER — ALUM & MAG HYDROXIDE-SIMETH 200-200-20 MG/5ML PO SUSP: 30.0000 mL | Freq: Four times a day (QID) | ORAL | Status: DC | PRN

## 2011-08-20 MED ORDER — LORATADINE 10 MG PO TABS
10.0000 mg | ORAL_TABLET | Freq: Every day | ORAL | Status: DC
  Administered 2011-08-20 – 2011-08-22 (×3): 10 mg via ORAL
  Filled 2011-08-20 (×2): qty 1

## 2011-08-20 MED ORDER — BIOTENE DRY MOUTH MT LIQD
15.0000 mL | Freq: Two times a day (BID) | OROMUCOSAL | Status: DC
  Administered 2011-08-20 – 2011-08-22 (×4): 15 mL via OROMUCOSAL

## 2011-08-20 MED ORDER — GUAIFENESIN-DM 100-10 MG/5ML PO SYRP: 5.0000 mL | ORAL_SOLUTION | ORAL | Status: DC | PRN

## 2011-08-20 MED ORDER — RENA-VITE PO TABS
1.0000 | ORAL_TABLET | Freq: Every day | ORAL | Status: DC
  Administered 2011-08-20 – 2011-08-22 (×3): 1 via ORAL
  Filled 2011-08-20 (×4): qty 1

## 2011-08-20 NOTE — ED Notes (Signed)
RN informed by ED tech patient is c/o chest pain. RN in to assess patient. Patient c/o left sided chest pressure, denies pain radiation. Patient in NAD at time of assessment. Patient placed on cardiac monitor and set of Vital signs obtained. Vital signs stable. EKG obtained by ED tech and shown to Dr Weldon Inches and MD informed of patient complaints. No further orders obtained at this time.

## 2011-08-20 NOTE — H&P (Signed)
JUNIA NYGREN MRN: [id number] DOB/AGE: Oct 31, 1920 78 y.o. Primary Care Physician:HALL,ZACK, MD Admit date: 08/20/2011 Chief Complaint: Right knee pain and swelling. HPI: The patient is a pleasant 78 year old woman with a past medical history significant for chronic leg swelling, chronic atrial fibrillation, chronic anticoagulation, diastolic congestive heart failure, hypertension, and type 2 diabetes mellitus. She presents to the emergency department today with a chief complaint of right leg pain. When questioned, she has pain behind her right knee. The pain started yesterday after she was ambulating. She suddenly heard a pop behind her right knee. Prior to that, she had been having progressive swelling in her leg which prompted her to come to the emergency department last week. A venous ultrasound was ordered and it was negative for DVT. Over the past 24 hours, her knee/leg has become more painful and more swollen. At its worse, it is an 8/10 in intensity. It is a sharp pain. It worsens when she stands from sitting and when she attempts to walk. She denies any trauma. She denies any recent falls. She says because of the pain, it has been nearly impossible for her to even stand or walk. Her son brought her to the emergency department because she is unable to carry out her activities of daily living.  In the emergency department, the patient is noted to be afebrile and hemodynamically stable. However, she was mildly bradycardic with a heart rate of 57 beats per minute on admission. Her lab data are significant for a BUN of 30, INR of 2.87, glucose of 209, and normal cardiac enzymes. Her EKG reveals atrial fibrillation with a heart rate of 58 beats per minute and nonspecific ST and T wave abnormalities. She is being admitted for further evaluation and management.   Past Medical History  Diagnosis Date  . Hypertension   . Carcinoma of breast     left masectomy in 1995  . Atrial fibrillation 2007    CHF with  preserved LV systolic function - 2008; moderate LVH  . Arteriosclerotic cardiovascular disease (ASCVD)     cath in 2001- 40% LAD and 50% RCA; stent for 80% LAD in 5/04; residual 50% LAD, 80% small D1, 70% small OM1 50% ostial RCA, nl EF   . MVP (mitral valve prolapse)     moderate; with moderate MR  . PVD (peripheral vascular disease)     ABIs of 0.64 and 0.59, right and left leg in 2009  . CVD (cerebrovascular disease)     plaque w/o focal disease in 2006; h/o CVA  . Diabetes mellitus type II     no insulin   . Hypothyroid   . Osteoporosis   . Edema   . Herpes zoster   . Chronic hoarseness   . Bronchitis     history  . Varicose veins of legs 08/20/2011  . Lower extremity edema 08/20/2011  . Chronic diastolic heart failure 11/21/2009    Past Surgical History  Procedure Date  . Orif of left hip 05/22/06    Romeo Apple  . Left masectomy 1995  . Cholecystectomy 2006  . Abdominal hysterectomy     abd?  . Cataract extraction, bilateral     Prior to Admission medications   Medication Sig Start Date End Date Taking? Authorizing Provider  aspirin 81 MG EC tablet Take 81 mg by mouth every morning.    Yes Historical Provider, MD  b complex-vitamin c-folic acid (NEPHRO-VITE) 0.8 MG TABS Take 0.8 mg by mouth at bedtime.  Yes Historical Provider, MD  beta carotene w/minerals (OCUVITE) tablet Take 1 tablet by mouth daily.     Yes Historical Provider, MD  calcium-vitamin D (OSCAL 500/200 D-3) 500-200 MG-UNIT per tablet Take 1 tablet by mouth daily.     Yes Historical Provider, MD  digoxin (LANOXIN) 0.125 MG tablet Take 125 mcg by mouth daily.     Yes Historical Provider, MD  diltiazem (CARDIZEM CD) 240 MG 24 hr capsule Take 240 mg by mouth at bedtime.    Yes Historical Provider, MD  fexofenadine (ALLEGRA) 180 MG tablet Take 180 mg by mouth daily.     Yes Historical Provider, MD  furosemide (LASIX) 20 MG tablet Take 20 mg by mouth daily.     Yes Historical Provider, MD  glipiZIDE (GLUCOTROL) 5  MG tablet Take 5 mg by mouth daily.    Yes Historical Provider, MD  levothyroxine (SYNTHROID, LEVOTHROID) 25 MCG tablet Take 25 mcg by mouth every morning.    Yes Historical Provider, MD  losartan (COZAAR) 50 MG tablet Take 50 mg by mouth daily.     Yes Historical Provider, MD  metFORMIN (GLUCOPHAGE) 500 MG tablet Take 500 mg by mouth 2 (two) times daily with a meal.     Yes Historical Provider, MD  nebivolol (BYSTOLIC) 10 MG tablet Take 10 mg by mouth daily.     Yes Historical Provider, MD  omeprazole (PRILOSEC) 20 MG capsule Take 20 mg by mouth 2 (two) times daily.     Yes Historical Provider, MD  potassium chloride SA (K-DUR,KLOR-CON) 20 MEQ tablet Take 20 mEq by mouth daily.     Yes Historical Provider, MD  raloxifene (EVISTA) 60 MG tablet Take 60 mg by mouth daily.  07/22/11  Yes Randall An, MD  rosuvastatin (CRESTOR) 10 MG tablet Take 10 mg by mouth at bedtime as needed and may repeat dose one time if needed.     Yes Historical Provider, MD  traMADol-acetaminophen (ULTRACET) 37.5-325 MG per tablet Take 1 tablet by mouth every 6 (six) hours as needed. For pain    Yes Historical Provider, MD  warfarin (COUMADIN) 2 MG tablet Take 2-4 mg by mouth as directed. Takes 4 mg on Mondays and Fridays and 2 mg the rest of the week. **Takes at bedtime**   Yes Historical Provider, MD    Allergies: No Known Allergies  No family history on file.  Social History: She is widowed. She lives alone in [place]. She has 3 children in all. Her son, kin, is present today. She is retired. She still drives a little. She denies tobacco and alcohol use.      ROS: As above in history present illness. She has chronic swelling in both of her legs. Otherwise, review of systems is negative.  PHYSICAL EXAM: Blood pressure 153/71, pulse 62, temperature 98.2 F (36.8 C), temperature source Oral, resp. rate 16, SpO2 96.00%.  General: The patient is a pleasant elderly 78 year old Caucasian woman who is currently  sitting up in bed in no acute distress. HEENT: Head is normocephalic, nontraumatic. Pupils are equal, round, and reactive to light. Extraocular movements are intact. Conjunctivae are clear. Sclerae are white. Oropharynx reveals moist mucous membranes. No posterior exudates or erythema. Neck is supple, no adenopathy, no thyromegaly, no JVD. Lungs: Clear to auscultation bilaterally. Heart: S1, S2, with a 1-2/6 systolic murmur. Abdomen: Protuberant, positive bowel sounds, soft, nontender, nondistended. Extremities: Multiple varicose veins around her lower legs. There is globally 2+ nonpitting edema of her right leg.  There is a moderate size pleural effusion over her right knee globally. There is no erythema or warmth. There is moderate tenderness in the popliteal area with no palpable lymph nodes and no masses palpated. She has restricted range of motion with flexion and extension cause of pain. The left lower extremity has a trace to 1+ nonpitting edema. Pedal pulses barely palpable bilaterally. Neurologic: The patient is alert and oriented x3. Cranial nerves II through XII are intact. Strength is 5 over 5 throughout with exception of the right knee because of pain. Sensation is grossly intact.  Basic Metabolic Panel:  Basename 08/20/11 1148 08/19/11 1234  NA 137 136  K 4.4 4.4  CL 103 101  CO2 28 28  GLUCOSE 182* 209*  BUN 30* 30*  CREATININE 0.78 0.92  CALCIUM 9.8 10.1  MG -- --  PHOS -- --   Liver Function Tests:  Basename 08/19/11 1234  AST 16  ALT 17  ALKPHOS 65  BILITOT 0.4  PROT 6.8  ALBUMIN 3.4*   No results found for this basename: LIPASE:2,AMYLASE:2 in the last 72 hours No results found for this basename: AMMONIA:2 in the last 72 hours CBC:  Basename 08/20/11 1148 08/19/11 1234  WBC 10.3 9.9  NEUTROABS 8.1* --  HGB 12.7 13.4  HCT 39.0 41.5  MCV 90.3 91.2  PLT 233 265   Cardiac Enzymes:  Basename 08/20/11 1348  CKTOTAL 51  CKMB 3.3  CKMBINDEX --  TROPONINI  <0.30   BNP: No results found for this basename: PROBNP:3 in the last 72 hours D-Dimer: No results found for this basename: DDIMER:2 in the last 72 hours CBG: No results found for this basename: GLUCAP:6 in the last 72 hours Hemoglobin A1C: No results found for this basename: HGBA1C in the last 72 hours Fasting Lipid Panel: No results found for this basename: CHOL,HDL,LDLCALC,TRIG,CHOLHDL,LDLDIRECT in the last 72 hours Thyroid Function Tests: No results found for this basename: TSH,T4TOTAL,FREET4,T3FREE,THYROIDAB in the last 72 hours Anemia Panel: No results found for this basename: VITAMINB12,FOLATE,FERRITIN,TIBC,IRON,RETICCTPCT in the last 72 hours Coagulation:  Basename 08/20/11 1148 08/19/11 1311  LABPROT 30.5* --  INR 2.87* 2.9   Urine Drug Screen: Drugs of Abuse  No results found for this basename: labopia, cocainscrnur, labbenz, amphetmu, thcu, labbarb    Alcohol Level: No results found for this basename: ETH:2 in the last 72 hours    EKG: Atrial fibrillation with a heart rate of 58 beats per minute, nonspecific ST and T wave abnormalities, possibly digitalis effect.  No results found for this or any previous visit (from the past 240 hour(s)).   Results for orders placed during the hospital encounter of 08/20/11 (from the past 48 hour(s))  CBC     Status: Normal   Collection Time   08/20/11 11:48 AM      Component Value Range Comment   WBC 10.3  4.0 - 10.5 (K/uL)    RBC 4.32  3.87 - 5.11 (MIL/uL)    Hemoglobin 12.7  12.0 - 15.0 (g/dL)    HCT 16.1  09.6 - 04.5 (%)    MCV 90.3  78.0 - 100.0 (fL)    MCH 29.4  26.0 - 34.0 (pg)    MCHC 32.6  30.0 - 36.0 (g/dL)    RDW 40.9  81.1 - 91.4 (%)    Platelets 233  150 - 400 (K/uL)   DIFFERENTIAL     Status: Abnormal   Collection Time   08/20/11 11:48 AM      Component  Value Range Comment   Neutrophils Relative 78 (*) 43 - 77 (%)    Neutro Abs 8.1 (*) 1.7 - 7.7 (K/uL)    Lymphocytes Relative 14  12 - 46 (%)    Lymphs Abs  1.4  0.7 - 4.0 (K/uL)    Monocytes Relative 7  3 - 12 (%)    Monocytes Absolute 0.7  0.1 - 1.0 (K/uL)    Eosinophils Relative 1  0 - 5 (%)    Eosinophils Absolute 0.1  0.0 - 0.7 (K/uL)    Basophils Relative 0  0 - 1 (%)    Basophils Absolute 0.0  0.0 - 0.1 (K/uL)   BASIC METABOLIC PANEL     Status: Abnormal   Collection Time   08/20/11 11:48 AM      Component Value Range Comment   Sodium 137  135 - 145 (mEq/L)    Potassium 4.4  3.5 - 5.1 (mEq/L)    Chloride 103  96 - 112 (mEq/L)    CO2 28  19 - 32 (mEq/L)    Glucose, Bld 182 (*) 70 - 99 (mg/dL)    BUN 30 (*) 6 - 23 (mg/dL)    Creatinine, Ser 2.95  0.50 - 1.10 (mg/dL)    Calcium 9.8  8.4 - 10.5 (mg/dL)    GFR calc non Af Amer 73 (*) >90 (mL/min)    GFR calc Af Amer 84 (*) >90 (mL/min)   PROTIME-INR     Status: Abnormal   Collection Time   08/20/11 11:48 AM      Component Value Range Comment   Prothrombin Time 30.5 (*) 11.6 - 15.2 (seconds)    INR 2.87 (*) 0.00 - 1.49    CARDIAC PANEL(CRET KIN+CKTOT+MB+TROPI)     Status: Normal   Collection Time   08/20/11  1:48 PM      Component Value Range Comment   Total CK 51  7 - 177 (U/L)    CK, MB 3.3  0.3 - 4.0 (ng/mL)    Troponin I <0.30  <0.30 (ng/mL)    Relative Index RELATIVE INDEX IS INVALID  0.0 - 2.5    POCT I-STAT TROPONIN I     Status: Normal   Collection Time   08/20/11  2:01 PM      Component Value Range Comment   Troponin i, poc 0.01  0.00 - 0.08 (ng/mL)    Comment 3              US Venous Img Lower Unilateral Right  08/14/2011  *RADIOLOGY REPORT*  Clinical Data: Right-sided calf pain.  RIGHT LOWER EXTREMITY VENOUS DUPLEX ULTRASOUND  Technique:  Gray-scale sonography with graded compression, as well as color Doppler and duplex ultrasound, were performed to evaluate the deep venous system of the lower extremity from the level of the common femoral vein through the popliteal and proximal calf veins. Spectral Doppler was utilized to evaluate flow at rest and with distal augmentation  maneuvers.  Comparison:  None.  Findings: Normal flow, compressibility, and augmentation within the distal common femoral, proximal greater saphenous/profunda femoral, entire femoral, and popliteal veins.  Calf edema is identified. Limited evaluation of the calf veins without gross thrombus identified.  IMPRESSION:  1.  No evidence of deep venous thrombosis within the right lower extremity. 2.  Calf edema.  Original Report Authenticated By: Consuello Bossier, M.D.   Dg Chest Port 1 View  08/20/2011  *RADIOLOGY REPORT*  Clinical Data: Left chest pain.  PORTABLE CHEST -  1 VIEW  Comparison: 10/18/2010  Findings: Cardiomegaly.  Mild peribronchial thickening, stable.  No confluent opacities, effusions or acute bony abnormality.  IMPRESSION: Bronchitic changes, stable.  Cardiomegaly.  Original Report Authenticated By: Cyndie Chime, M.D.    Impression:  Principal Problem:  *Right knee pain Active Problems:  DIABETES MELLITUS, TYPE II  Hypertension  Chronic diastolic heart failure  Atrial fibrillation  Hypothyroid  Effusion of right knee  Varicose veins of legs  Lower extremity edema  Bradycardia   1. Right knee pain, possibly secondary to acute arthritis and pleural effusion. The patient is unable to ambulate because of the pain. Therefore, she is unable to carry out normal activities of daily living. She will be admitted for observation and further management.  Chronic bilateral leg swelling. This may be secondary to venous insufficiency as she does have prominent varicose veins of both legs.  Chronic atrophic fibrillation. She is anticoagulated and her INR is therapeutic. She was initially bradycardic. Her rate is now within normal limits.  Hypertension. Currently controlled.  Type 2 diabetes mellitus. Her blood glucose is currently elevated. She is treated with glipizide and metformin chronically.  Hypothyroidism. She is treated with replacement therapy.  Plan:  1. We will order a CT  scan of her right knee for further evaluation. We will consult the orthopedic surgeon to evaluate her right knee for an arthrocentesis.  We will treat her pain with as needed tramadol and morphine. We will schedule arthritis strength Tylenol twice a day. We will add twice a day dosing of prednisone. We will consult the physical therapist for evaluation and management.  We will add sliding scale NovoLog to metformin and glipizide.  We will order hemoglobin A1c, TSH, and free T4. We will ask pharmacy to help dose to Coumadin and monitor her INR.      Lavine Hargrove 08/20/2011, 4:59 PM

## 2011-08-20 NOTE — ED Notes (Signed)
Report given to [facility], RN unit 300. Ready to receive patient. Dr Sherrie Mustache currently assessing patient and patient will be moved when Dr Sherrie Mustache is completed with her exam.

## 2011-08-20 NOTE — Progress Notes (Signed)
ANTICOAGULATION CONSULT NOTE - Initial Consult  Pharmacy Consult for Coumadin  Indication: atrial fibrillation  No Known Allergies  Patient Measurements:    Vital Signs: Temp: 98.2 F (36.8 C) (01/08 1126) Temp src: Oral (01/08 1126) BP: 153/71 mmHg (01/08 1600) Pulse Rate: 62  (01/08 1600)  Labs:  Basename 08/20/11 1348 08/20/11 1148 08/19/11 1311 08/19/11 1234  HGB -- 12.7 -- 13.4  HCT -- 39.0 -- 41.5  PLT -- 233 -- 265  APTT -- -- -- --  LABPROT -- 30.5* -- --  INR -- 2.87* 2.9 --  HEPARINUNFRC -- -- -- --  CREATININE -- 0.78 -- 0.92  CKTOTAL 51 -- -- --  CKMB 3.3 -- -- --  TROPONINI <0.30 -- -- --   The CrCl is unknown because both a height and weight (above a minimum accepted value) are required for this calculation.  Medical History: Past Medical History  Diagnosis Date  . Hypertension   . Carcinoma of breast     left masectomy in 1995  . Atrial fibrillation 2007    CHF with preserved LV systolic function - 2008; moderate LVH  . Arteriosclerotic cardiovascular disease (ASCVD)     cath in 2001- 40% LAD and 50% RCA; stent for 80% LAD in 5/04; residual 50% LAD, 80% small D1, 70% small OM1 50% ostial RCA, nl EF   . MVP (mitral valve prolapse)     moderate; with moderate MR  . PVD (peripheral vascular disease)     ABIs of 0.64 and 0.59, right and left leg in 2009  . CVD (cerebrovascular disease)     plaque w/o focal disease in 2006; h/o CVA  . Diabetes mellitus type II     no insulin   . Hypothyroid   . Osteoporosis   . Edema   . Herpes zoster   . Chronic hoarseness   . Bronchitis     history  . Varicose veins of legs 08/20/2011  . Lower extremity edema 08/20/2011  . Chronic diastolic heart failure 11/21/2009    Medications:  Scheduled:    . acetaminophen  650 mg Oral BID  . aspirin EC  81 mg Oral Q0700  . beta carotene w/minerals  1 tablet Oral Daily  . calcium-vitamin D  1 tablet Oral Daily  . digoxin  125 mcg Oral Daily  . diltiazem  240 mg Oral  QHS  . docusate sodium  100 mg Oral BID  . furosemide  20 mg Oral Daily  . glipiZIDE  5 mg Oral Daily  . insulin aspart  0-5 Units Subcutaneous QHS  . insulin aspart  0-9 Units Subcutaneous TID WC  . levothyroxine  25 mcg Oral Q0700  . loratadine  10 mg Oral Daily  . losartan  50 mg Oral Daily  . metFORMIN  500 mg Oral BID WC  .  morphine injection  4 mg Intravenous Once  . multivitamin  1 tablet Oral Daily  . nebivolol  10 mg Oral Daily  . ondansetron  4 mg Intravenous Once  . pantoprazole  40 mg Oral Q1200  . potassium chloride SA  20 mEq Oral Daily  . predniSONE  10 mg Oral BID WC  . DISCONTD: sodium chloride   Intravenous STAT    Assessment: INR 2.87 Patient takes 4 mg on Monday and Friday, 2 mg all other days at home  Goal of Therapy:  INR 2-3   Plan:  Coumadin 2 mg tonight, patient home dose per medication reconciliation  Raquel James, Jacion Dismore Bennett 08/20/2011,5:11 PM

## 2011-08-20 NOTE — ED Provider Notes (Signed)
History   Scribed for Nicholes Stairs, MD, the patient was seen in [facility]. The chart was scribed by Gilman Schmidt. The patients care was started at 11:30 AM.   CSN: [id number]  Arrival date & time 08/20/11  1104   First MD Initiated Contact with Patient 08/20/11 1124      No chief complaint on file.   (Consider location/radiation/quality/duration/timing/severity/associated sxs/prior treatment) HPI [name] is a 78 y.o. female who presents to the Emergency Department complaining of swelling in right leg. Pt reports that she came out of the bathroom yesterday and heard something pop in her leg. States that leg is painful right now. Also notes numbness in right foot. States she was seen in ED last week for leg swelling but no pain. Denies any chest pain, difficulty breathing, fever, nausea, or vomiting. Denies any recent fall or injury. Denies any history of heart attack. Notes taking two fluid pills this morning and elevating foot. Pt is on Coumadin. There are no other associated symptoms and no other alleviating or aggravating factors. States she cannot stand up and ambulate due to severity of pain.  Lives alone.  PCP: Dr. Margo Aye  Lives alone Past Medical History  Diagnosis Date  . Hypertension   . Carcinoma of breast     left masectomy in 1995  . Atrial fibrillation 2007    CHF with preserved LV systolic function - 2008; moderate LVH  . Arteriosclerotic cardiovascular disease (ASCVD)     cath in 2001- 40% LAD and 50% RCA; stent for 80% LAD in 5/04; residual 50% LAD, 80% small D1, 70% small OM1 50% ostial RCA, nl EF   . MVP (mitral valve prolapse)     moderate; with moderate MR  . PVD (peripheral vascular disease)     ABIs of 0.64 and 0.59, right and left leg in 2009  . CVD (cerebrovascular disease)     plaque w/o focal disease in 2006; h/o CVA  . Diabetes mellitus type II     no insulin   . Hypothyroid   . Osteoporosis   . Edema   . Herpes zoster   . Chronic hoarseness    . Bronchitis     history    Past Surgical History  Procedure Date  . Orif of left hip 05/22/06    Romeo Apple  . Left masectomy 1995  . Cholecystectomy 2006  . Abdominal hysterectomy     abd?  . Cataract extraction, bilateral     No family history on file.  History  Substance Use Topics  . Smoking status: Never Smoker   . Smokeless tobacco: Never Used   Comment: tobacco use - no   . Alcohol Use: No    OB History    Grav Para Term Preterm Abortions TAB SAB Ect Mult Living                  Review of Systems  Constitutional: Negative for fever.  Respiratory: Negative for shortness of breath.   Cardiovascular: Positive for leg swelling. Negative for chest pain.  Gastrointestinal: Negative for nausea, vomiting and diarrhea.  All other systems reviewed and are negative.    Allergies  Review of patient's allergies indicates no known allergies.  Home Medications   Current Outpatient Rx  Name Route Sig Dispense Refill  . ASPIRIN 81 MG PO TBEC Oral Take 81 mg by mouth every morning.     Marland Kitchen CALCIUM CARBONATE-VITAMIN D 500-200 MG-UNIT PO TABS Oral Take 1 tablet by  mouth daily.      Marland Kitchen DILTIAZEM HCL ER COATED BEADS 240 MG PO CP24 Oral Take 240 mg by mouth at bedtime.     Marland Kitchen FEXOFENADINE HCL 180 MG PO TABS Oral Take 180 mg by mouth daily.      . FUROSEMIDE 20 MG PO TABS Oral Take 20 mg by mouth daily.      Marland Kitchen GLIPIZIDE 5 MG PO TABS Oral Take 5 mg by mouth daily.     Marland Kitchen LEVOTHYROXINE SODIUM 25 MCG PO TABS Oral Take 25 mcg by mouth every morning.     Marland Kitchen LOSARTAN POTASSIUM 50 MG PO TABS Oral Take 50 mg by mouth daily.      Marland Kitchen METFORMIN HCL 500 MG PO TABS Oral Take 500 mg by mouth 2 (two) times daily with a meal.      . NEBIVOLOL HCL 10 MG PO TABS Oral Take 10 mg by mouth daily.      Marland Kitchen OMEPRAZOLE 20 MG PO CPDR Oral Take 20 mg by mouth 2 (two) times daily.      Marland Kitchen POTASSIUM CHLORIDE CRYS ER 20 MEQ PO TBCR Oral Take 20 mEq by mouth daily.      Marland Kitchen RALOXIFENE HCL 60 MG PO TABS       .  ROSUVASTATIN CALCIUM 10 MG PO TABS Oral Take 10 mg by mouth at bedtime as needed and may repeat dose one time if needed.      Marland Kitchen TRAMADOL-ACETAMINOPHEN 37.5-325 MG PO TABS Oral Take 1 tablet by mouth every 6 (six) hours as needed. For pain     . WARFARIN SODIUM 2 MG PO TABS Oral Take 2-4 mg by mouth as directed. Takes 4 mg on Mondays and Fridays and 2 mg the rest of the week. **Takes at bedtime**      BP 130/64  Pulse 77  Temp(Src) 98.2 F (36.8 C) (Oral)  Resp 20  SpO2 100%  Physical Exam  Constitutional: She is oriented to person, place, and time. She appears well-developed and well-nourished.  Non-toxic appearance. She does not have a sickly appearance.  HENT:  Head: Normocephalic and atraumatic.  Eyes: Conjunctivae, EOM and lids are normal. Pupils are equal, round, and reactive to light. No scleral icterus.  Neck: Trachea normal and normal range of motion. Neck supple. No JVD present. Carotid bruit is not present.  Cardiovascular: Normal heart sounds.  An irregular rhythm present.  No murmur heard. Pulmonary/Chest: Effort normal and breath sounds normal. She has no wheezes. She has no rales.  Abdominal: Soft. Normal appearance. There is no tenderness. There is no rebound, no guarding and no CVA tenderness.  Musculoskeletal: Normal range of motion. She exhibits edema and tenderness.       Right calf ttp with 4+ edema  Neurological: She is alert and oriented to person, place, and time. She has normal strength.  Skin: Skin is warm, dry and intact. No rash noted.  Psychiatric: She has a normal mood and affect.    ED Course  Procedures (including critical care time) 78 year old, female, with chronic lower extremity swelling, presents with sudden onset of right calf pain since yesterday.  She says the pain is so intense that she is not able to stand up or ambulate.  She has no pain anywhere else.  She had a Doppler, ultrasound of her lower extremity on December 27 that did not show a DVT.   Without history of trauma.  X-rays are not indicated.  We will do blood tests,  and give her IV analgesics.  08/08/11  Doppler US no dvt   DIAGNOSTIC STUDIES: Oxygen Saturation is 100% on room air, normal by my interpretation.    PT reported cp while in ed so ecg done ED ECG REPORT   Date: 08/20/2011  EKG Time: 1:14 PM  Rate: 58  Rhythm: atrial fibrillation, unchanged from previous tracings  Axis: nl  Intervals:none  ST&T Change: nonspecific  Narrative Interpretation: Atrial fibrillation with controlled rate.  No signs of cardiac ischemia.  No change from previous EKG     LABS Results for orders placed during the hospital encounter of 08/20/11  CBC      Component Value Range   WBC 10.3  4.0 - 10.5 (K/uL)   RBC 4.32  3.87 - 5.11 (MIL/uL)   Hemoglobin 12.7  12.0 - 15.0 (g/dL)   HCT 40.9  81.1 - 91.4 (%)   MCV 90.3  78.0 - 100.0 (fL)   MCH 29.4  26.0 - 34.0 (pg)   MCHC 32.6  30.0 - 36.0 (g/dL)   RDW 78.2  95.6 - 21.3 (%)   Platelets 233  150 - 400 (K/uL)  DIFFERENTIAL      Component Value Range   Neutrophils Relative 78 (*) 43 - 77 (%)   Neutro Abs 8.1 (*) 1.7 - 7.7 (K/uL)   Lymphocytes Relative 14  12 - 46 (%)   Lymphs Abs 1.4  0.7 - 4.0 (K/uL)   Monocytes Relative 7  3 - 12 (%)   Monocytes Absolute 0.7  0.1 - 1.0 (K/uL)   Eosinophils Relative 1  0 - 5 (%)   Eosinophils Absolute 0.1  0.0 - 0.7 (K/uL)   Basophils Relative 0  0 - 1 (%)   Basophils Absolute 0.0  0.0 - 0.1 (K/uL)  BASIC METABOLIC PANEL      Component Value Range   Sodium 137  135 - 145 (mEq/L)   Potassium 4.4  3.5 - 5.1 (mEq/L)   Chloride 103  96 - 112 (mEq/L)   CO2 28  19 - 32 (mEq/L)   Glucose, Bld 182 (*) 70 - 99 (mg/dL)   BUN 30 (*) 6 - 23 (mg/dL)   Creatinine, Ser 0.86  0.50 - 1.10 (mg/dL)   Calcium 9.8  8.4 - 57.8 (mg/dL)   GFR calc non Af Amer 73 (*) >90 (mL/min)   GFR calc Af Amer 84 (*) >90 (mL/min)  PROTIME-INR      Component Value Range   Prothrombin Time 30.5 (*) 11.6 - 15.2 (seconds)     INR 2.87 (*) 0.00 - 1.49           COORDINATION OF CARE: 11:30am:  - Patient evaluated by ED physician, Morphine, Zofran, CBC, Diff, BMP, Protime-INR ordered  1:40 PM Says left cp still present.  She did NOT have cp when she arrived.  ecg no stemi.  Will check ces and cxr  MDM  Cp- no evidence of pneumonia, cardiac ischemia   I personally performed the services described in this documentation, which was scribed in my presence. The recorded information has been reviewed and considered.       Nicholes Stairs, MD 08/21/11 [phone]

## 2011-08-20 NOTE — ED Notes (Signed)
Patient assisted with bedpan. Urinated without difficulty. Patient still c/o chest pain. Dr Weldon Inches in to reassess patient. Lab to obtain blood work for cardiac workup. Patient informed and agreeable with plan.

## 2011-08-20 NOTE — ED Notes (Signed)
Right leg swollen. States she heard something pop behind knee yesterday

## 2011-08-21 ENCOUNTER — Encounter (HOSPITAL_COMMUNITY): Payer: Self-pay | Admitting: Orthopedic Surgery

## 2011-08-21 ENCOUNTER — Inpatient Hospital Stay (HOSPITAL_COMMUNITY): Payer: Medicare Other

## 2011-08-21 DIAGNOSIS — M2540 Effusion, unspecified joint: Secondary | ICD-10-CM | POA: Diagnosis not present

## 2011-08-21 DIAGNOSIS — M1711 Unilateral primary osteoarthritis, right knee: Secondary | ICD-10-CM

## 2011-08-21 DIAGNOSIS — M25569 Pain in unspecified knee: Secondary | ICD-10-CM

## 2011-08-21 DIAGNOSIS — D649 Anemia, unspecified: Secondary | ICD-10-CM | POA: Diagnosis not present

## 2011-08-21 DIAGNOSIS — E119 Type 2 diabetes mellitus without complications: Secondary | ICD-10-CM | POA: Diagnosis not present

## 2011-08-21 DIAGNOSIS — M79609 Pain in unspecified limb: Secondary | ICD-10-CM | POA: Diagnosis not present

## 2011-08-21 HISTORY — DX: Unilateral primary osteoarthritis, right knee: M17.11

## 2011-08-21 LAB — GLUCOSE, CAPILLARY
Glucose-Capillary: 83 mg/dL (ref 70–99)
Glucose-Capillary: 177 mg/dL — ABNORMAL HIGH (ref 70–99)
Glucose-Capillary: 164 mg/dL — ABNORMAL HIGH (ref 70–99)
Glucose-Capillary: 220 mg/dL — ABNORMAL HIGH (ref 70–99)

## 2011-08-21 LAB — BASIC METABOLIC PANEL
BUN: 19 mg/dL (ref 6–23)
Chloride: 110 mEq/L (ref 96–112)
Creatinine, Ser: 0.63 mg/dL (ref 0.50–1.10)
GFR calc Af Amer: >90 mL/min (ref >90)
GFR calc non Af Amer: 78 mL/min — ABNORMAL LOW (ref >90)

## 2011-08-21 LAB — HEMOGLOBIN A1C
Hgb A1c MFr Bld: 6.7 % — ABNORMAL HIGH (ref <5.7)
Mean Plasma Glucose: 146 mg/dL — ABNORMAL HIGH (ref <117)

## 2011-08-21 LAB — CBC
HCT: 40.3 % (ref 36.0–46.0)
MCHC: 32.8 g/dL (ref 30.0–36.0)
RDW: 14.7 % (ref 11.5–15.5)

## 2011-08-21 LAB — PROTIME-INR: INR: 3.02 — ABNORMAL HIGH (ref 0.00–1.49)

## 2011-08-21 MED ORDER — SODIUM CHLORIDE 0.9 % IJ SOLN
3.0000 mL | Freq: Two times a day (BID) | INTRAMUSCULAR | Status: DC
  Administered 2011-08-21 – 2011-08-22 (×2): 3 mL via INTRAVENOUS
  Filled 2011-08-21 (×2): qty 3

## 2011-08-21 MED ORDER — WARFARIN SODIUM 1 MG PO TABS
1.0000 mg | ORAL_TABLET | Freq: Once | ORAL | Status: AC
  Administered 2011-08-21: 1 mg via ORAL
  Filled 2011-08-21: qty 1

## 2011-08-21 MED ORDER — MILK AND MOLASSES ENEMA
Freq: Once | RECTAL | Status: AC
  Administered 2011-08-21: 14:00:00 via RECTAL

## 2011-08-21 NOTE — Consult Note (Signed)
Reason for Consult: Pain swelling right knee inability to ambulate on right lower extremity Referring Physician: Dr. Guadalupe Maple is an 78 y.o. female.  HPI: The patient gave a confused and confusing history but the medical record indicates that she was ambulating felt a pop in her right knee and then had difficulty ambulating. She was brought to the hospital could not walk secondary to right knee pain and swelling. She been evaluated with an ultrasound for right lower extremity edema which was negative. She does have venous stasis disease.  CT scan of the knee indicated lipoma hemarthrosis raising the possibility of occult fracture although no obvious fracture was seen on the CT scan. Plain films are pending.  The patient is also status post left hip open treatment internal fixation with short gamma nail. That seems to be doing well.  Past Medical History  Diagnosis Date  . Hypertension   . Carcinoma of breast     left masectomy in 1995  . Atrial fibrillation 2007    CHF with preserved LV systolic function - 2008; moderate LVH  . Arteriosclerotic cardiovascular disease (ASCVD)     cath in 2001- 40% LAD and 50% RCA; stent for 80% LAD in 5/04; residual 50% LAD, 80% small D1, 70% small OM1 50% ostial RCA, nl EF   . MVP (mitral valve prolapse)     moderate; with moderate MR  . PVD (peripheral vascular disease)     ABIs of 0.64 and 0.59, right and left leg in 2009  . CVD (cerebrovascular disease)     plaque w/o focal disease in 2006; h/o CVA  . Diabetes mellitus type II     no insulin   . Hypothyroid   . Osteoporosis   . Edema   . Herpes zoster   . Chronic hoarseness   . Bronchitis     history  . Varicose veins of legs 08/20/2011  . Lower extremity edema 08/20/2011  . Chronic diastolic heart failure 11/21/2009    Past Surgical History  Procedure Date  . Orif of left hip 05/22/06    Romeo Apple  . Left masectomy 1995  . Cholecystectomy 2006  . Abdominal hysterectomy     abd?    . Cataract extraction, bilateral     History reviewed. No pertinent family history.  Social History:  reports that she has never smoked. She has never used smokeless tobacco. She reports that she does not drink alcohol or use illicit drugs.  Allergies: No Known Allergies  Medications: I have reviewed the patient's current medications.  Results for orders placed during the hospital encounter of 08/20/11 (from the past 48 hour(s))  CBC     Status: Normal   Collection Time   08/20/11 11:48 AM      Component Value Range Comment   WBC 10.3  4.0 - 10.5 (K/uL)    RBC 4.32  3.87 - 5.11 (MIL/uL)    Hemoglobin 12.7  12.0 - 15.0 (g/dL)    HCT 16.1  09.6 - 04.5 (%)    MCV 90.3  78.0 - 100.0 (fL)    MCH 29.4  26.0 - 34.0 (pg)    MCHC 32.6  30.0 - 36.0 (g/dL)    RDW 40.9  81.1 - 91.4 (%)    Platelets 233  150 - 400 (K/uL)   DIFFERENTIAL     Status: Abnormal   Collection Time   08/20/11 11:48 AM      Component Value Range Comment   Neutrophils  Relative 78 (*) 43 - 77 (%)    Neutro Abs 8.1 (*) 1.7 - 7.7 (K/uL)    Lymphocytes Relative 14  12 - 46 (%)    Lymphs Abs 1.4  0.7 - 4.0 (K/uL)    Monocytes Relative 7  3 - 12 (%)    Monocytes Absolute 0.7  0.1 - 1.0 (K/uL)    Eosinophils Relative 1  0 - 5 (%)    Eosinophils Absolute 0.1  0.0 - 0.7 (K/uL)    Basophils Relative 0  0 - 1 (%)    Basophils Absolute 0.0  0.0 - 0.1 (K/uL)   BASIC METABOLIC PANEL     Status: Abnormal   Collection Time   08/20/11 11:48 AM      Component Value Range Comment   Sodium 137  135 - 145 (mEq/L)    Potassium 4.4  3.5 - 5.1 (mEq/L)    Chloride 103  96 - 112 (mEq/L)    CO2 28  19 - 32 (mEq/L)    Glucose, Bld 182 (*) 70 - 99 (mg/dL)    BUN 30 (*) 6 - 23 (mg/dL)    Creatinine, Ser 0.98  0.50 - 1.10 (mg/dL)    Calcium 9.8  8.4 - 10.5 (mg/dL)    GFR calc non Af Amer 73 (*) >90 (mL/min)    GFR calc Af Amer 84 (*) >90 (mL/min)   PROTIME-INR     Status: Abnormal   Collection Time   08/20/11 11:48 AM      Component  Value Range Comment   Prothrombin Time 30.5 (*) 11.6 - 15.2 (seconds)    INR 2.87 (*) 0.00 - 1.49    CARDIAC PANEL(CRET KIN+CKTOT+MB+TROPI)     Status: Normal   Collection Time   08/20/11  1:48 PM      Component Value Range Comment   Total CK 51  7 - 177 (U/L)    CK, MB 3.3  0.3 - 4.0 (ng/mL)    Troponin I <0.30  <0.30 (ng/mL)    Relative Index RELATIVE INDEX IS INVALID  0.0 - 2.5    POCT I-STAT TROPONIN I     Status: Normal   Collection Time   08/20/11  2:01 PM      Component Value Range Comment   Troponin i, poc 0.01  0.00 - 0.08 (ng/mL)    Comment 3            GLUCOSE, CAPILLARY     Status: Abnormal   Collection Time   08/20/11  5:44 PM      Component Value Range Comment   Glucose-Capillary 135 (*) 70 - 99 (mg/dL)    Comment 1 Notify RN      Comment 2 Documented in Chart     GLUCOSE, CAPILLARY     Status: Abnormal   Collection Time   08/20/11 10:14 PM      Component Value Range Comment   Glucose-Capillary 141 (*) 70 - 99 (mg/dL)    Comment 1 Notify RN      Comment 2 Documented in Chart     BASIC METABOLIC PANEL     Status: Abnormal   Collection Time   08/21/11  4:51 AM      Component Value Range Comment   Sodium 142  135 - 145 (mEq/L)    Potassium 3.8  3.5 - 5.1 (mEq/L)    Chloride 110  96 - 112 (mEq/L)    CO2 27  19 - 32 (mEq/L)  Glucose, Bld 92  70 - 99 (mg/dL)    BUN 19  6 - 23 (mg/dL) DELTA CHECK NOTED   Creatinine, Ser 0.63  0.50 - 1.10 (mg/dL)    Calcium 9.1  8.4 - 10.5 (mg/dL)    GFR calc non Af Amer 78 (*) >90 (mL/min)    GFR calc Af Amer >90  >90 (mL/min)   CBC     Status: Normal   Collection Time   08/21/11  4:51 AM      Component Value Range Comment   WBC 8.0  4.0 - 10.5 (K/uL)    RBC 4.47  3.87 - 5.11 (MIL/uL)    Hemoglobin 13.2  12.0 - 15.0 (g/dL)    HCT 16.1  09.6 - 04.5 (%)    MCV 90.2  78.0 - 100.0 (fL)    MCH 29.5  26.0 - 34.0 (pg)    MCHC 32.8  30.0 - 36.0 (g/dL)    RDW 40.9  81.1 - 91.4 (%)    Platelets 224  150 - 400 (K/uL)   PROTIME-INR      Status: Abnormal   Collection Time   08/21/11  4:51 AM      Component Value Range Comment   Prothrombin Time 31.8 (*) 11.6 - 15.2 (seconds)    INR 3.02 (*) 0.00 - 1.49    GLUCOSE, CAPILLARY     Status: Normal   Collection Time   08/21/11  7:53 AM      Component Value Range Comment   Glucose-Capillary 83  70 - 99 (mg/dL)     Ct Knee Right Wo Contrast  08/20/2011  *RADIOLOGY REPORT*  Clinical Data: Fall pop in right knee yesterday.  Unable to ambulate.  CT OF THE RIGHT KNEE WITHOUT CONTRAST  Technique:  Multidetector CT imaging was performed according to the standard protocol. Multiplanar CT image reconstructions were also generated.  Comparison: None.  Findings: Within the limitations of CT imaging, the anterior cruciate ligament and posterior cruciate ligament appeared be grossly intact as do the distal quadriceps tendon and patellar tendon.  Partially calcified meniscus.  Limited evaluation of the meniscus by unenhanced CT.  Joint effusion without lipohemarthrosis to indicate radiographically occult fracture.  Tricompartment degenerative changes.  Vascular calcifications.  IMPRESSION: Within the limitations of CT imaging, the anterior cruciate ligament and posterior cruciate ligament appeared be grossly intact as do the distal quadriceps tendon and patellar tendon.  Partially calcified meniscus.  Limited evaluation of the meniscus by unenhanced CT.  Joint effusion without lipohemarthrosis to indicate radiographically occult fracture.  Tricompartment degenerative changes.  Vascular calcifications.  Original Report Authenticated By: Fuller Canada, M.D.   Dg Chest Port 1 View  08/20/2011  *RADIOLOGY REPORT*  Clinical Data: Left chest pain.  PORTABLE CHEST - 1 VIEW  Comparison: 10/18/2010  Findings: Cardiomegaly.  Mild peribronchial thickening, stable.  No confluent opacities, effusions or acute bony abnormality.  IMPRESSION: Bronchitic changes, stable.  Cardiomegaly.  Original Report Authenticated By:  Cyndie Chime, M.D.    Review of Systems  Unable to perform ROS: mental acuity   Blood pressure 150/70, pulse 79, temperature 98.7 F (37.1 C), temperature source Oral, resp. rate 20, height 5' (1.524 m), weight 139 lb 15.9 oz (63.5 kg), SpO2 92.00%. Physical Exam  Constitutional: She appears well-developed and well-nourished. No distress.  HENT:  Head: Normocephalic and atraumatic.  Neck: No JVD present. No tracheal deviation present.  Cardiovascular: Normal rate.   Respiratory: Effort normal.  GI: Soft.  Musculoskeletal:  Right shoulder: Normal.       Left shoulder: Normal.       Right hip: Normal.       Left hip: Normal.       Right knee: She exhibits decreased range of motion, swelling, effusion and bony tenderness. She exhibits no ecchymosis, no deformity, no erythema, no LCL laxity, normal patellar mobility and no MCL laxity. tenderness found. No medial joint line, no lateral joint line, no MCL, no LCL and no patellar tendon tenderness noted.       Left knee: She exhibits normal range of motion, no swelling, no effusion, no deformity, normal alignment, no LCL laxity, normal patellar mobility, no bony tenderness and no MCL laxity. no tenderness found. No medial joint line, no lateral joint line, no MCL, no LCL and no patellar tendon tenderness noted.  Lymphadenopathy:    She has no cervical adenopathy.  Neurological: She is alert.  Skin: Skin is warm and dry.  Psychiatric: She has a normal mood and affect. Her behavior is normal.    Assessment/Plan: Differential diagnosis occult fracture right knee Versus acute effusion secondary to degenerative joint disease, ruptured Baker's cyst.  Plan we'll recommend plain films to complete diagnostic workup. Knee immobilizer and change to hinged knee brace if plain films are also negative. Start physical therapy tomorrow after CT scan and plain films have been correlated. The effusion is not large and does not require  aspiration.  Fuller Canada 08/21/2011, 9:37 AM

## 2011-08-21 NOTE — Progress Notes (Signed)
Physical Therapy Evaluation Patient Details Name: [name] MRN: [id number] DOB: 1921-03-18 Today's Date: 08/21/2011  Problem List:  Patient Active Problem List  Diagnoses  . HERPES ZOSTER  . DIABETES MELLITUS, TYPE II  . HYPERLIPIDEMIA  . Hypertension  . Chronic diastolic heart failure  . SPINAL STENOSIS, LUMBAR  . FRACTURE, FEMUR, INTERTROCHANTERIC REGION  . Chronic anticoagulation  . Arteriosclerotic cardiovascular disease (ASCVD)  . Carcinoma of breast  . Atrial fibrillation  . MVP (mitral valve prolapse)  . PVD (peripheral vascular disease)  . CVD (cerebrovascular disease)  . Hypothyroid  . Osteoporosis  . Right knee pain  . Effusion of right knee  . Varicose veins of legs  . Lower extremity edema  . Bradycardia    Past Medical History:  Past Medical History  Diagnosis Date  . Hypertension   . Carcinoma of breast     left masectomy in 1995  . Atrial fibrillation 2007    CHF with preserved LV systolic function - 2008; moderate LVH  . Arteriosclerotic cardiovascular disease (ASCVD)     cath in 2001- 40% LAD and 50% RCA; stent for 80% LAD in 5/04; residual 50% LAD, 80% small D1, 70% small OM1 50% ostial RCA, nl EF   . MVP (mitral valve prolapse)     moderate; with moderate MR  . PVD (peripheral vascular disease)     ABIs of 0.64 and 0.59, right and left leg in 2009  . CVD (cerebrovascular disease)     plaque w/o focal disease in 2006; h/o CVA  . Diabetes mellitus type II     no insulin   . Hypothyroid   . Osteoporosis   . Edema   . Herpes zoster   . Chronic hoarseness   . Bronchitis     history  . Varicose veins of legs 08/20/2011  . Lower extremity edema 08/20/2011  . Chronic diastolic heart failure 11/21/2009   Past Surgical History:  Past Surgical History  Procedure Date  . Orif of left hip 05/22/06    Romeo Apple  . Left masectomy 1995  . Cholecystectomy 2006  . Abdominal hysterectomy     abd?  . Cataract extraction, bilateral     PT  Assessment/Plan/Recommendation PT Assessment Clinical Impression Statement: Pt with decreased activity tolerance who will benefit from skilled care while she is in the hospital to improve functional level. PT Recommendation/Assessment: Patient will need skilled PT in the acute care venue PT Problem List: Decreased activity tolerance;Decreased mobility PT Therapy Diagnosis : Difficulty walking PT Plan PT Frequency: Min 3X/week PT Treatment/Interventions: Gait training;Stair training. Pt has 4 steps to get into her house so will need to navigate steps prior to leaving hospital. PT Recommendation Equipment Recommended: Toilet rise with handles PT Goals  Acute Rehab PT Goals PT Goal Formulation: With patient Pt will go Supine/Side to Sit: with modified independence Pt will Ambulate: 51 - 150 feet;with modified independence Pt will Go Up / Down Stairs: 3-5 stairs;with modified independence  PT Evaluation Precautions/Restrictions  Precautions Required Braces or Orthoses: No Restrictions Weight Bearing Restrictions: No Prior Functioning  Home Living Lives With: Alone Receives Help From: Family;Personal care attendant Type of Home: House Home Layout: One level Home Access: Stairs to enter Entrance Stairs-Rails: Right Entrance Stairs-Number of Steps: 5 Bathroom Shower/Tub: Engineer, manufacturing systems: Standard Prior Function Level of Independence: Requires assistive device for independence Driving: No Vocation: Retired Financial risk analyst Arousal/Alertness: Awake/alert Overall Cognitive Status: Appears within functional limits for tasks assessed Orientation Level: Oriented  X4 Sensation/Coordination Sensation Light Touch: Appears Intact Coordination Gross Motor Movements are Fluid and Coordinated: Yes Fine Motor Movements are Fluid and Coordinated: Not tested Extremity Assessment   Mobility (including Balance) Bed Mobility Bed Mobility: Yes Rolling Right: 5: Set  up Supine to Sit: 3: Mod assist Sitting - Scoot to Edge of Bed: 5: Supervision Transfers Transfers: Yes Sit to Stand: 4: Min assist (mod assist for commode) Stand to Sit: 5: Supervision (min assist to low commode) Ambulation/Gait Ambulation/Gait: Yes Ambulation/Gait Assistance: 6: Modified independent (Device/Increase time) Ambulation Distance (Feet): 25 Feet Assistive device: Rolling walker Gait Pattern: Decreased step length - right;Decreased step length - left;Decreased dorsiflexion - left;Decreased dorsiflexion - right Gait velocity: slow Stairs: No  Balance Balance Assessed: Yes Static Standing Balance Static Standing - Balance Support: No upper extremity supported Static Standing - Level of Assistance: 6: Modified independent (Device/Increase time) Static Standing - Comment/# of Minutes: 3 minutes.  Pt able to wash hands, wash face and brush teeth at sink. Exercise    End of Session PT - End of Session Equipment Utilized During Treatment: Gait belt Activity Tolerance: Patient tolerated treatment well Patient left: in chair;with call bell in reach Nurse Communication: Mobility status for transfers General Behavior During Session: [facility] for tasks performed Cognition: [facility] for tasks performed  Sennie Borden,CINDY 08/21/2011, 10:39 AM

## 2011-08-21 NOTE — Progress Notes (Signed)
ANTICOAGULATION CONSULT NOTE   Pharmacy Consult for Coumadin  Indication: atrial fibrillation  No Known Allergies  Patient Measurements: Height: 5' (152.4 cm) Weight: 139 lb 15.9 oz (63.5 kg) IBW/kg (Calculated) : 45.5   Vital Signs: Temp: 98.7 F (37.1 C) (01/09 0600) BP: 150/70 mmHg (01/09 0600) Pulse Rate: 79  (01/09 0600)  Labs:  Basename 08/21/11 0451 08/20/11 1348 08/20/11 1148 08/19/11 1311 08/19/11 1234  HGB 13.2 -- 12.7 -- --  HCT 40.3 -- 39.0 -- 41.5  PLT 224 -- 233 -- 265  APTT -- -- -- -- --  LABPROT 31.8* -- 30.5* -- --  INR 3.02* -- 2.87* 2.9 --  HEPARINUNFRC -- -- -- -- --  CREATININE 0.63 -- 0.78 -- 0.92  CKTOTAL -- 51 -- -- --  CKMB -- 3.3 -- -- --  TROPONINI -- <0.30 -- -- --   Estimated Creatinine Clearance: 40.4 ml/min (by C-G formula based on Cr of 0.63).  Medical History: Past Medical History  Diagnosis Date  . Hypertension   . Carcinoma of breast     left masectomy in 1995  . Atrial fibrillation 2007    CHF with preserved LV systolic function - 2008; moderate LVH  . Arteriosclerotic cardiovascular disease (ASCVD)     cath in 2001- 40% LAD and 50% RCA; stent for 80% LAD in 5/04; residual 50% LAD, 80% small D1, 70% small OM1 50% ostial RCA, nl EF   . MVP (mitral valve prolapse)     moderate; with moderate MR  . PVD (peripheral vascular disease)     ABIs of 0.64 and 0.59, right and left leg in 2009  . CVD (cerebrovascular disease)     plaque w/o focal disease in 2006; h/o CVA  . Diabetes mellitus type II     no insulin   . Hypothyroid   . Osteoporosis   . Edema   . Herpes zoster   . Chronic hoarseness   . Bronchitis     history  . Varicose veins of legs 08/20/2011  . Lower extremity edema 08/20/2011  . Chronic diastolic heart failure 11/21/2009    Medications:  Scheduled:     . acetaminophen  650 mg Oral BID  . antiseptic oral rinse  15 mL Mouth Rinse BID  . aspirin EC  81 mg Oral Q0700  . beta carotene w/minerals  1 tablet Oral  Daily  . calcium-vitamin D  1 tablet Oral Daily  . digoxin  125 mcg Oral Daily  . diltiazem  240 mg Oral QHS  . docusate sodium  100 mg Oral BID  . furosemide  20 mg Oral Daily  . glipiZIDE  5 mg Oral Daily  . insulin aspart  0-5 Units Subcutaneous QHS  . insulin aspart  0-9 Units Subcutaneous TID WC  . levothyroxine  25 mcg Oral Q0700  . loratadine  10 mg Oral Daily  . losartan  50 mg Oral Daily  . metFORMIN  500 mg Oral BID WC  .  morphine injection  4 mg Intravenous Once  . multivitamin  1 tablet Oral Daily  . nebivolol  10 mg Oral Daily  . ondansetron  4 mg Intravenous Once  . pantoprazole  40 mg Oral Q1200  . potassium chloride SA  20 mEq Oral Daily  . predniSONE  10 mg Oral BID WC  . warfarin  1 mg Oral ONCE-1800  . warfarin  2 mg Oral ONCE-1800  . DISCONTD: sodium chloride   Intravenous STAT  Assessment: INR therapeutic on upper end of range  Goal of Therapy:  INR 2-3   Plan:  Coumadin 1 mg tonight (dose reduction) INR daily until stable  Annya Lizana A 08/21/2011,10:19 AM

## 2011-08-21 NOTE — Progress Notes (Signed)
Patient was up constantly using the bedpan during the night, approximately 10-12 times.  She states she goes to the bathroom like this at home as well.  She states she takes her diuretic in the AM.  Just wanted to [facility] the physician to this.

## 2011-08-21 NOTE — Progress Notes (Signed)
Subjective: The patient says her knee feels better. She has no complaints of chest pain or chest congestion. She says that she urinated all night.  Objective: Vital signs in last 24 hours: Filed Vitals:   08/20/11 2100 08/20/11 2108 08/21/11 0600 08/21/11 0842  BP:  166/66 150/70   Pulse:  73 79   Temp:  98.8 F (37.1 C) 98.7 F (37.1 C)   TempSrc:  Oral    Resp:  18 20   Height:      Weight:   63.5 kg (139 lb 15.9 oz)   SpO2: 99% 99% 99% 92%    Intake/Output Summary (Last 24 hours) at 08/21/11 1112 Last data filed at 08/21/11 0700  Gross per 24 hour  Intake    240 ml  Output   1050 ml  Net   -810 ml    Weight change:   Physical exam: Lungs: Clear to auscultation bilaterally. Heart: Irregular, irregular. Abdomen: Positive bowel sounds, soft, nontender, nondistended. Extremities: Noted right knee joint effusion, significantly less than yesterday. She still has some tenderness in the popliteal area and posterior calf area without any masses palpated. There is no warmth over the knee. Less global right lower extremity edema at approximately trace to 1+ and trace of pedal edema on the left lower extremity. Again noted multiple varicosities of the lower extremities bilaterally. Neurologic: She is alert and oriented x3.  Lab Results: Basic Metabolic Panel:  Basename 08/21/11 0451 08/20/11 1148  NA 142 137  K 3.8 4.4  CL 110 103  CO2 27 28  GLUCOSE 92 182*  BUN 19 30*  CREATININE 0.63 0.78  CALCIUM 9.1 9.8  MG -- --  PHOS -- --   Liver Function Tests:  Basename 08/19/11 1234  AST 16  ALT 17  ALKPHOS 65  BILITOT 0.4  PROT 6.8  ALBUMIN 3.4*   No results found for this basename: LIPASE:2,AMYLASE:2 in the last 72 hours No results found for this basename: AMMONIA:2 in the last 72 hours CBC:  Basename 08/21/11 0451 08/20/11 1148  WBC 8.0 10.3  NEUTROABS -- 8.1*  HGB 13.2 12.7  HCT 40.3 39.0  MCV 90.2 90.3  PLT 224 233   Cardiac Enzymes:  Basename 08/20/11  1348  CKTOTAL 51  CKMB 3.3  CKMBINDEX --  TROPONINI <0.30   BNP: No results found for this basename: PROBNP:3 in the last 72 hours D-Dimer: No results found for this basename: DDIMER:2 in the last 72 hours CBG:  Basename 08/21/11 0753 08/20/11 2214 08/20/11 1744  GLUCAP 83 141* 135*   Hemoglobin A1C: No results found for this basename: HGBA1C in the last 72 hours Fasting Lipid Panel: No results found for this basename: CHOL,HDL,LDLCALC,TRIG,CHOLHDL,LDLDIRECT in the last 72 hours Thyroid Function Tests: No results found for this basename: TSH,T4TOTAL,FREET4,T3FREE,THYROIDAB in the last 72 hours Anemia Panel: No results found for this basename: VITAMINB12,FOLATE,FERRITIN,TIBC,IRON,RETICCTPCT in the last 72 hours Coagulation:  Basename 08/21/11 0451 08/20/11 1148  LABPROT 31.8* 30.5*  INR 3.02* 2.87*   Urine Drug Screen: Drugs of Abuse  No results found for this basename: labopia, cocainscrnur, labbenz, amphetmu, thcu, labbarb    Alcohol Level: No results found for this basename: ETH:2 in the last 72 hours   Micro: No results found for this or any previous visit (from the past 240 hour(s)).  Studies/Results: Ct Knee Right Wo Contrast  08/20/2011  *RADIOLOGY REPORT*  Clinical Data: Fall pop in right knee yesterday.  Unable to ambulate.  CT OF THE RIGHT KNEE  WITHOUT CONTRAST  Technique:  Multidetector CT imaging was performed according to the standard protocol. Multiplanar CT image reconstructions were also generated.  Comparison: None.  Findings: Within the limitations of CT imaging, the anterior cruciate ligament and posterior cruciate ligament appeared be grossly intact as do the distal quadriceps tendon and patellar tendon.  Partially calcified meniscus.  Limited evaluation of the meniscus by unenhanced CT.  Joint effusion without lipohemarthrosis to indicate radiographically occult fracture.  Tricompartment degenerative changes.  Vascular calcifications.  IMPRESSION: Within  the limitations of CT imaging, the anterior cruciate ligament and posterior cruciate ligament appeared be grossly intact as do the distal quadriceps tendon and patellar tendon.  Partially calcified meniscus.  Limited evaluation of the meniscus by unenhanced CT.  Joint effusion without lipohemarthrosis to indicate radiographically occult fracture.  Tricompartment degenerative changes.  Vascular calcifications.  Original Report Authenticated By: Fuller Canada, M.D.   Dg Chest Port 1 View  08/20/2011  *RADIOLOGY REPORT*  Clinical Data: Left chest pain.  PORTABLE CHEST - 1 VIEW  Comparison: 10/18/2010  Findings: Cardiomegaly.  Mild peribronchial thickening, stable.  No confluent opacities, effusions or acute bony abnormality.  IMPRESSION: Bronchitic changes, stable.  Cardiomegaly.  Original Report Authenticated By: Cyndie Chime, M.D.    Medications: I have reviewed the patient's current medications.  Assessment: Principal Problem:  *Right knee pain Active Problems:  DIABETES MELLITUS, TYPE II  Hypertension  Chronic diastolic heart failure  Atrial fibrillation  Hypothyroid  Effusion of right knee  Varicose veins of legs  Lower extremity edema  Bradycardia  Right knee DJD  1. Right knee pain with knee effusion and associated degenerative joint disease radiographically and a possibility of an occult fracture per CT scan. She is being treated appropriately with analgesics as needed and with scheduled Tylenol. Also, she is being given twice a day dosing of prednisone to help decrease the inflammation. Orthopedic surgeon Dr. Romeo Apple has seen and evaluated the patient, greatly appreciated. He has ordered plain x-rays of her knee immobilizer. The physical therapist, Debarah Crape has seen and evaluated the patient, greatly appreciated. She is recommending  short-term skilled nursing facility placement. The patient is not necessarily receptive to this recommendation.   Chronic atrial fibrillation. Rate  controlled. Adequately anticoagulated.  Hypertension. Currently stable.  Type 2 diabetes mellitus. Currently stable and controlled.  Plan:  1. Decrease the IV fluids.  2 view x-rays of the knee ordered by Dr. Romeo Apple. We'll followup on the results and his recommendations.  We'll discontinue glipizide while the patient is on sliding scale NovoLog to avoid symptomatic hypoglycemia.   LOS: 1 day   [name] 08/21/2011, 11:12 AM

## 2011-08-22 DIAGNOSIS — M79609 Pain in unspecified limb: Secondary | ICD-10-CM | POA: Diagnosis not present

## 2011-08-22 DIAGNOSIS — M2540 Effusion, unspecified joint: Secondary | ICD-10-CM | POA: Diagnosis not present

## 2011-08-22 DIAGNOSIS — D649 Anemia, unspecified: Secondary | ICD-10-CM | POA: Diagnosis not present

## 2011-08-22 DIAGNOSIS — E119 Type 2 diabetes mellitus without complications: Secondary | ICD-10-CM | POA: Diagnosis not present

## 2011-08-22 MED ORDER — PREDNISONE 10 MG PO TABS: 10.0000 mg | ORAL_TABLET | Freq: Every day | ORAL | Status: DC

## 2011-08-22 MED ORDER — ACETAMINOPHEN 325 MG PO TABS: 650.0000 mg | ORAL_TABLET | Freq: Two times a day (BID) | ORAL | Status: AC

## 2011-08-22 MED ORDER — WARFARIN SODIUM 2 MG PO TABS: 2.0000 mg | ORAL_TABLET | Freq: Once | ORAL | Status: DC

## 2011-08-22 NOTE — Progress Notes (Signed)
ANTICOAGULATION CONSULT NOTE   Pharmacy Consult for Coumadin  Indication: atrial fibrillation  No Known Allergies  Patient Measurements: Height: 5' (152.4 cm) Weight: 139 lb 15.9 oz (63.5 kg) IBW/kg (Calculated) : 45.5   Vital Signs: Temp: 98.6 F (37 C) (01/10 0516) Temp src: Oral (01/10 0516) BP: 173/79 mmHg (01/10 1031) Pulse Rate: 63  (01/10 1031)  Labs:  Basename 08/22/11 0454 08/21/11 0451 08/20/11 1348 08/20/11 1148 08/19/11 1234  HGB -- 13.2 -- 12.7 --  HCT -- 40.3 -- 39.0 41.5  PLT -- 224 -- 233 265  APTT -- -- -- -- --  LABPROT 29.4* 31.8* -- 30.5* --  INR 2.73* 3.02* -- 2.87* --  HEPARINUNFRC -- -- -- -- --  CREATININE -- 0.63 -- 0.78 0.92  CKTOTAL -- -- 51 -- --  CKMB -- -- 3.3 -- --  TROPONINI -- -- <0.30 -- --   Estimated Creatinine Clearance: 40.4 ml/min (by C-G formula based on Cr of 0.63).  Medical History: Past Medical History  Diagnosis Date  . Hypertension   . Carcinoma of breast     left masectomy in 1995  . Atrial fibrillation 2007    CHF with preserved LV systolic function - 2008; moderate LVH  . Arteriosclerotic cardiovascular disease (ASCVD)     cath in 2001- 40% LAD and 50% RCA; stent for 80% LAD in 5/04; residual 50% LAD, 80% small D1, 70% small OM1 50% ostial RCA, nl EF   . MVP (mitral valve prolapse)     moderate; with moderate MR  . PVD (peripheral vascular disease)     ABIs of 0.64 and 0.59, right and left leg in 2009  . CVD (cerebrovascular disease)     plaque w/o focal disease in 2006; h/o CVA  . Diabetes mellitus type II     no insulin   . Hypothyroid   . Osteoporosis   . Edema   . Herpes zoster   . Chronic hoarseness   . Bronchitis     history  . Varicose veins of legs 08/20/2011  . Lower extremity edema 08/20/2011  . Chronic diastolic heart failure 11/21/2009  . Right knee DJD 08/21/2011   Medications:  Scheduled:     . acetaminophen  650 mg Oral BID  . antiseptic oral rinse  15 mL Mouth Rinse BID  . aspirin EC  81  mg Oral Q0700  . beta carotene w/minerals  1 tablet Oral Daily  . calcium-vitamin D  1 tablet Oral Daily  . digoxin  125 mcg Oral Daily  . diltiazem  240 mg Oral QHS  . docusate sodium  100 mg Oral BID  . furosemide  20 mg Oral Daily  . insulin aspart  0-5 Units Subcutaneous QHS  . insulin aspart  0-9 Units Subcutaneous TID WC  . levothyroxine  25 mcg Oral Q0700  . loratadine  10 mg Oral Daily  . losartan  50 mg Oral Daily  . metFORMIN  500 mg Oral BID WC  . milk and molasses   Rectal Once  . multivitamin  1 tablet Oral Daily  . nebivolol  10 mg Oral Daily  . pantoprazole  40 mg Oral Q1200  . potassium chloride SA  20 mEq Oral Daily  . predniSONE  10 mg Oral BID WC  . sodium chloride  3 mL Intravenous Q12H  . warfarin  1 mg Oral ONCE-1800  . warfarin  2 mg Oral ONCE-1800  . DISCONTD: glipiZIDE  5 mg Oral Daily  Assessment: INR therapeutic   Goal of Therapy:  INR 2-3   Plan:  Coumadin 2mg  today INR daily until stable  [name] A 08/22/2011,10:53 AM

## 2011-08-22 NOTE — Discharge Summary (Signed)
Physician Discharge Summary  [name] MRN: [id number] DOB/AGE: 1920/11/15 78 y.o.  PCP: Dwana Melena, MD   Admit date: 08/20/2011 Discharge date: 08/22/2011  Discharge Diagnoses:  1. Right knee pain and swelling. Possible occult fracture versus exacerbation of tricompartmental degenerative joint disease. 2. Chronic atrial fibrillation with occasional asymptomatic bradycardia. 3. Chronic anticoagulation secondary to Coumadin. Her INR was 2.73 at the time of discharge. 4. Type 2 diabetes mellitus. Her hemoglobin A1c was 6.7. 5. Hypothyroidism. Her TSH was within normal limits at 0.7 and her free T4 was within normal limits at 1.22. 6. Chronic bilateral lower extremity edema in the setting of chronic varicose veins. 7. Hypertension. Well-controlled. 8. Chronic diastolic heart failure. Compensated.   Current Discharge Medication List    START taking these medications   Details  acetaminophen (TYLENOL) 325 MG tablet Take 2 tablets (650 mg total) by mouth 2 (two) times daily. Qty: 30 tablet    predniSONE (DELTASONE) 10 MG tablet Take 1 tablet (10 mg total) by mouth daily. TAKE FOR 5 MORE DAYS. Qty: 5 tablet, Refills: 0      CONTINUE these medications which have NOT CHANGED   Details  aspirin 81 MG EC tablet Take 81 mg by mouth every morning.     b complex-vitamin c-folic acid (NEPHRO-VITE) 0.8 MG TABS Take 0.8 mg by mouth at bedtime.      beta carotene w/minerals (OCUVITE) tablet Take 1 tablet by mouth daily.      calcium-vitamin D (OSCAL 500/200 D-3) 500-200 MG-UNIT per tablet Take 1 tablet by mouth daily.      digoxin (LANOXIN) 0.125 MG tablet Take 125 mcg by mouth daily.      diltiazem (CARDIZEM CD) 240 MG 24 hr capsule Take 240 mg by mouth at bedtime.     fexofenadine (ALLEGRA) 180 MG tablet Take 180 mg by mouth daily.      furosemide (LASIX) 20 MG tablet Take 20 mg by mouth daily.      glipiZIDE (GLUCOTROL) 5 MG tablet Take 5 mg by mouth daily.     levothyroxine  (SYNTHROID, LEVOTHROID) 25 MCG tablet Take 25 mcg by mouth every morning.     losartan (COZAAR) 50 MG tablet Take 50 mg by mouth daily.      metFORMIN (GLUCOPHAGE) 500 MG tablet Take 500 mg by mouth 2 (two) times daily with a meal.      nebivolol (BYSTOLIC) 10 MG tablet Take 10 mg by mouth daily.      omeprazole (PRILOSEC) 20 MG capsule Take 20 mg by mouth 2 (two) times daily.      potassium chloride SA (K-DUR,KLOR-CON) 20 MEQ tablet Take 20 mEq by mouth daily.     Associated Diagnoses: Carcinoma of breast    raloxifene (EVISTA) 60 MG tablet Take 60 mg by mouth daily.     rosuvastatin (CRESTOR) 10 MG tablet Take 10 mg by mouth at bedtime as needed and may repeat dose one time if needed.      traMADol-acetaminophen (ULTRACET) 37.5-325 MG per tablet Take 1 tablet by mouth every 6 (six) hours as needed. For pain     warfarin (COUMADIN) 2 MG tablet Take 2-4 mg by mouth as directed. Takes 4 mg on Mondays and Fridays and 2 mg the rest of the week. **Takes at bedtime**      STOP taking these medications     Cod Liver Oil CAPS      potassium chloride 20 MEQ/15ML (10%) solution  Discharge Condition: Improved and stable  Disposition: Home or Self Care   Consults: Dr. Fuller Canada.   Significant Diagnostic Studies: Dg Knee 1-2 Views Right  08/21/2011  *RADIOLOGY REPORT*  Clinical Data: Right knee pain and swelling.  RIGHT KNEE - 1-2 VIEW  Comparison: CT of the right knee dated 08/20/2011.  Findings: Mild joint space narrowing and subchondral sclerosis predominately in the medial compartment.  Subtle calcification of the menisci, consistent with chondrocalcinosis.  Lateral projection demonstrates a small suprapatellar knee effusion.  No displaced fracture, subluxation or dislocation.  Numerous vascular calcifications are noted.  IMPRESSION:  1.  Small suprapatellar joint effusion.  Otherwise, no acute findings. 2.  Very mild changes of osteoarthritis most notably within the  medial compartment. 3.  Mild chondrocalcinosis.  Original Report Authenticated By: Florencia Reasons, M.D.   Ct Knee Right Wo Contrast  08/20/2011  *RADIOLOGY REPORT*  Clinical Data: Fall pop in right knee yesterday.  Unable to ambulate.  CT OF THE RIGHT KNEE WITHOUT CONTRAST  Technique:  Multidetector CT imaging was performed according to the standard protocol. Multiplanar CT image reconstructions were also generated.  Comparison: None.  Findings: Within the limitations of CT imaging, the anterior cruciate ligament and posterior cruciate ligament appeared be grossly intact as do the distal quadriceps tendon and patellar tendon.  Partially calcified meniscus.  Limited evaluation of the meniscus by unenhanced CT.  Joint effusion without lipohemarthrosis to indicate radiographically occult fracture.  Tricompartment degenerative changes.  Vascular calcifications.  IMPRESSION: Within the limitations of CT imaging, the anterior cruciate ligament and posterior cruciate ligament appeared be grossly intact as do the distal quadriceps tendon and patellar tendon.  Partially calcified meniscus.  Limited evaluation of the meniscus by unenhanced CT.  Joint effusion without lipohemarthrosis to indicate radiographically occult fracture.  Tricompartment degenerative changes.  Vascular calcifications.  Original Report Authenticated By: Fuller Canada, M.D.   US Venous Img Lower Unilateral Right  08/14/2011  *RADIOLOGY REPORT*  Clinical Data: Right-sided calf pain.  RIGHT LOWER EXTREMITY VENOUS DUPLEX ULTRASOUND  Technique:  Gray-scale sonography with graded compression, as well as color Doppler and duplex ultrasound, were performed to evaluate the deep venous system of the lower extremity from the level of the common femoral vein through the popliteal and proximal calf veins. Spectral Doppler was utilized to evaluate flow at rest and with distal augmentation maneuvers.  Comparison:  None.  Findings: Normal flow,  compressibility, and augmentation within the distal common femoral, proximal greater saphenous/profunda femoral, entire femoral, and popliteal veins.  Calf edema is identified. Limited evaluation of the calf veins without gross thrombus identified.  IMPRESSION:  1.  No evidence of deep venous thrombosis within the right lower extremity. 2.  Calf edema.  Original Report Authenticated By: Consuello Bossier, M.D.   Dg Chest Port 1 View  08/20/2011  *RADIOLOGY REPORT*  Clinical Data: Left chest pain.  PORTABLE CHEST - 1 VIEW  Comparison: 10/18/2010  Findings: Cardiomegaly.  Mild peribronchial thickening, stable.  No confluent opacities, effusions or acute bony abnormality.  IMPRESSION: Bronchitic changes, stable.  Cardiomegaly.  Original Report Authenticated By: Cyndie Chime, M.D.     Microbiology: No results found for this or any previous visit (from the past 240 hour(s)).   Labs: Results for orders placed during the hospital encounter of 08/20/11 (from the past 48 hour(s))  GLUCOSE, CAPILLARY     Status: Abnormal   Collection Time   08/20/11  5:44 PM      Component Value  Range Comment   Glucose-Capillary 135 (*) 70 - 99 (mg/dL)    Comment 1 Notify RN      Comment 2 Documented in Chart     GLUCOSE, CAPILLARY     Status: Abnormal   Collection Time   08/20/11 10:14 PM      Component Value Range Comment   Glucose-Capillary 141 (*) 70 - 99 (mg/dL)    Comment 1 Notify RN      Comment 2 Documented in Chart     HEMOGLOBIN A1C     Status: Abnormal   Collection Time   08/21/11  4:51 AM      Component Value Range Comment   Hemoglobin A1C 6.7 (*) <5.7 (%)    Mean Plasma Glucose 146 (*) <117 (mg/dL)   TSH     Status: Normal   Collection Time   08/21/11  4:51 AM      Component Value Range Comment   TSH 0.742  0.350 - 4.500 (uIU/mL)   BASIC METABOLIC PANEL     Status: Abnormal   Collection Time   08/21/11  4:51 AM      Component Value Range Comment   Sodium 142  135 - 145 (mEq/L)    Potassium 3.8  3.5 -  5.1 (mEq/L)    Chloride 110  96 - 112 (mEq/L)    CO2 27  19 - 32 (mEq/L)    Glucose, Bld 92  70 - 99 (mg/dL)    BUN 19  6 - 23 (mg/dL) DELTA CHECK NOTED   Creatinine, Ser 0.63  0.50 - 1.10 (mg/dL)    Calcium 9.1  8.4 - 10.5 (mg/dL)    GFR calc non Af Amer 78 (*) >90 (mL/min)    GFR calc Af Amer >90  >90 (mL/min)   CBC     Status: Normal   Collection Time   08/21/11  4:51 AM      Component Value Range Comment   WBC 8.0  4.0 - 10.5 (K/uL)    RBC 4.47  3.87 - 5.11 (MIL/uL)    Hemoglobin 13.2  12.0 - 15.0 (g/dL)    HCT 16.1  09.6 - 04.5 (%)    MCV 90.2  78.0 - 100.0 (fL)    MCH 29.5  26.0 - 34.0 (pg)    MCHC 32.8  30.0 - 36.0 (g/dL)    RDW 40.9  81.1 - 91.4 (%)    Platelets 224  150 - 400 (K/uL)   PROTIME-INR     Status: Abnormal   Collection Time   08/21/11  4:51 AM      Component Value Range Comment   Prothrombin Time 31.8 (*) 11.6 - 15.2 (seconds)    INR 3.02 (*) 0.00 - 1.49    T4, FREE     Status: Normal   Collection Time   08/21/11  4:51 AM      Component Value Range Comment   Free T4 1.22  0.80 - 1.80 (ng/dL)   GLUCOSE, CAPILLARY     Status: Normal   Collection Time   08/21/11  7:53 AM      Component Value Range Comment   Glucose-Capillary 83  70 - 99 (mg/dL)   GLUCOSE, CAPILLARY     Status: Abnormal   Collection Time   08/21/11 11:20 AM      Component Value Range Comment   Glucose-Capillary 177 (*) 70 - 99 (mg/dL)   GLUCOSE, CAPILLARY     Status: Abnormal   Collection Time  08/21/11  4:39 PM      Component Value Range Comment   Glucose-Capillary 164 (*) 70 - 99 (mg/dL)   GLUCOSE, CAPILLARY     Status: Abnormal   Collection Time   08/21/11  8:33 PM      Component Value Range Comment   Glucose-Capillary 220 (*) 70 - 99 (mg/dL)   PROTIME-INR     Status: Abnormal   Collection Time   08/22/11  4:54 AM      Component Value Range Comment   Prothrombin Time 29.4 (*) 11.6 - 15.2 (seconds)    INR 2.73 (*) 0.00 - 1.49    GLUCOSE, CAPILLARY     Status: Abnormal   Collection Time     08/22/11  7:35 AM      Component Value Range Comment   Glucose-Capillary 135 (*) 70 - 99 (mg/dL)   GLUCOSE, CAPILLARY     Status: Abnormal   Collection Time   08/22/11 11:01 AM      Component Value Range Comment   Glucose-Capillary 140 (*) 70 - 99 (mg/dL)      HPI : The patient is an 78 year old woman with a past medical history significant for chronic leg swelling, chronic atrial fibrillation, chronic anticoagulation, and type 2 diabetes mellitus, who presented to the emergency department on 08/19/2010 with a chief complaint of right knee pain and swelling. She denies trauma or falling. A venous ultrasound was performed last week in the emergency department and it was negative for DVT. On admission, she was noted to be afebrile and hemodynamically stable although she was intermittently bradycardic with a heart rate of 57 beats per minute. Her lab data were significant for BUN of 30, INR is 2.87, glucose of 209, and normal cardiac enzymes. She was admitted for further evaluation and management.  HOSPITAL COURSE: The patient was started on treatment with prednisone 10 mg twice a day and twice a day dosing of arthritis strength Tylenol. Lasix was continued to help with generalized swelling. Stronger analgesics were ordered as needed. For further evaluation, a CT scan of her knee was ordered. It revealed findings consistent with an occult fracture. Orthopedic surgeon Dr. Fuller Canada was consulted. Following his assessment, his differential diagnoses included occult fracture of the right knee versus effusion secondary to joint degenerative joint disease versus a ruptured Bakers cyst. He recommended obtaining plain films to complete the diagnostic workup. He ordered a knee immobilizer and the start of physical therapy. He did not believe the effusion was large enough to aspirate. The plain x-rays of the knee revealed a small effusion and tricompartmental degenerative changes. It revealed no  fracture.  She was evaluated by the physical therapist. Following her assessment, she recommended home health physical therapy. The patient already had a walker at home. The patient, however, did not believe she needed physical therapy. Her pain resolved. The edema and joint effusion subsided substantially prior to discharge. At the time of discharge, she was ambulating in her room unassisted. Dr. Romeo Apple reevaluated her and recommended no further treatment, however, he wanted her to followup with him in the office in a week or 2. An appointment was made. She was continued on prednisone for 5 more days and analgesics as needed.  Her INR remained therapeutic. Her blood pressure remained stable. Her glycemic control was excellent. Her thyroid function was within normal limits on replacement therapy. She was discharged to home in improved and stable condition.    Discharge Exam: Blood pressure 151/81, pulse 65, temperature  98.5 F (36.9 C), temperature source Oral, resp. rate 20, height 5' (1.524 m), weight 63.5 kg (139 lb 15.9 oz), SpO2 97.00%. Lungs: Clear to auscultation bilaterally. Heart: Irregular, irregular, with borderline tachycardia. Abdomen: Positive bowel sounds, soft, nontender, nondistended. Extremities: Trace of right knee effusion, no warmth, no tenderness, trace of pedal edema bilaterally. Stable varicose veins of the lower extremities.    Discharge Orders    Future Appointments: Provider: Department: Dept Phone: Center:   09/02/2011 9:45 AM [facility] [phone] None   09/03/2011 3:45 PM Fuller Canada, MD [facility] [phone] [facility]   09/30/2011 1:50 PM Louanna Raw, RN [facility] [phone] [facility]   06/10/2012 2:00 PM Randall An, MD [facility] [phone] None     Future Orders Please Complete By Expires   Diet - low sodium heart healthy      Increase activity slowly      Scheduling Instructions:   USE  WALKER AND BRACE AS NEEDED.      Follow-up Information    Follow up with HALL,ZACK on 08/29/2011. (AT 11:00 AM)       Follow up with Fuller Canada, MD on 09/03/2011. (AT 3:30 PM)    Contact information:   [id number] [place] [address] [phone]          Discharge time: 40 minutes.   Signed: Mulki Roesler 08/22/2011, 2:49 PM

## 2011-08-22 NOTE — Progress Notes (Signed)
Objective: Vital signs in last 24 hours: Temp:  [97.8 F (36.6 C)-98.6 F (37 C)] 98.6 F (37 C) (01/10 0516) Pulse Rate:  [52-79] 63  (01/10 1031) Resp:  [20] 20  (01/10 0516) BP: (162-179)/(67-79) 173/79 mmHg (01/10 1031) SpO2:  [91 %-94 %] 91 % (01/10 0516)  Intake/Output from previous day: 01/09 0701 - 01/10 0700 In: 480 [P.O.:480] Out: 2225 [Urine:2225] Intake/Output this shift: Total I/O In: 3 [I.V.:3] Out: 200 [Urine:200]   Basename 08/21/11 0451 08/20/11 1148  HGB 13.2 12.7    Basename 08/21/11 0451 08/20/11 1148  WBC 8.0 10.3  RBC 4.47 4.32  HCT 40.3 39.0  PLT 224 233    Basename 08/21/11 0451 08/20/11 1148  NA 142 137  K 3.8 4.4  CL 110 103  CO2 27 28  BUN 19 30*  CREATININE 0.63 0.78  GLUCOSE 92 182*  CALCIUM 9.1 9.8    Basename 08/22/11 0454 08/21/11 0451  LABPT -- --  INR 2.73* 3.02*    The brace can be removed. The patient will take at home use only as needed. She will followup in the office in a week for reevaluation.  Fuller Canada 08/22/2011, 1:20 PM

## 2011-08-22 NOTE — Progress Notes (Signed)
Pt discharged home with family member.  Follow up appts in place with PCP and ortho MD.  PT instructed on new medications and how to take them.  Pt verbalizes understanding of discharge instructions

## 2011-08-22 NOTE — Progress Notes (Signed)
UR Chart Review Completed  

## 2011-08-22 NOTE — Progress Notes (Signed)
Physical Therapy Treatment Patient Details Name: [name] MRN: [id number] DOB: 03-16-1921 Today's Date: 08/22/2011 Time: 9:07-9:35 Charge: therapeutic activity 28 min PT Assessment/Plan  PT - Assessment/Plan Comments on Treatment Session: Pt tolerated treatment well, very coorperative and performed all activities correctly without difficulty.   PT Plan: Other (comment) (increase ambulation distance/ begin stair training before D/) PT Goals  Acute Rehab PT Goals PT Goal Formulation: With patient Pt will go Supine/Side to Sit: with modified independence Pt will Ambulate: 51 - 150 feet;with modified independence Pt will Go Up / Down Stairs: 3-5 stairs;with modified independence  PT Treatment Precautions/Restrictions  Precautions Precautions: Fall Required Braces or Orthoses: No Restrictions Weight Bearing Restrictions: No Mobility (including Balance) Transfers Sit to Stand: 5: Supervision Sit to Stand Details (indicate cue type and reason): min cueing for safety push from chair Stand to Sit: 5: Supervision Stand to Sit Details: min cueing to feel chair on back of knee and reach for chair with arms Ambulation/Gait Ambulation/Gait: Yes Ambulation/Gait Assistance: 6: Modified independent (Device/Increase time);5: Supervision Ambulation/Gait Assistance Details (indicate cue type and reason): min cueing for posture and to increase stride length Ambulation Distance (Feet): 104 Feet Assistive device: Rolling walker Gait Pattern: Decreased stride length;Trunk flexed    Exercise  Total Joint Exercises Ankle Circles/Pumps: AROM;Both;15 reps;Seated Quad Sets: AROM;Right;15 reps;Seated Gluteal Sets: AROM;Both;Other reps (comment);Limitations;Seated Gluteal Sets Limitations: 15 Heel Slides: Both;15 reps;Seated Hip ABduction/ADduction: AROM;Both;15 reps;Seated Long Arc Quad: Both;15 reps;Seated General Exercises - Lower Extremity Ankle Circles/Pumps: AROM;Both;15 reps;Seated Quad  Sets: AROM;Both;15 reps;Seated Gluteal Sets: AROM;Both;Other reps (comment);Seated Long Arc Quad: 15 reps;AROM;Both;Seated Heel Slides: AROM;Right;15 reps;Seated Hip ABduction/ADduction: AROM;Both;Seated Toe Raises: 10 reps;Standing Heel Raises: 10 reps;Standing Mini-Sqauts: 10 reps;Standing End of Session PT - End of Session Equipment Utilized During Treatment: Gait belt Activity Tolerance: Patient tolerated treatment well Patient left: in chair;with call bell in reach General Behavior During Session: [facility] for tasks performed Cognition: [facility] for tasks performed  [name] 08/22/2011, 9:41 AM

## 2011-08-26 ENCOUNTER — Telehealth: Payer: Self-pay | Admitting: Orthopedic Surgery

## 2011-08-26 NOTE — Telephone Encounter (Signed)
Patient called and cancelled the hospital fol/up appointment for her knee for 09/03/11. She states she feels it is not necessary right now, but said will call back when ready to re-schedule.

## 2011-08-29 ENCOUNTER — Ambulatory Visit: Payer: Medicare Other | Admitting: Orthopedic Surgery

## 2011-09-02 ENCOUNTER — Encounter (HOSPITAL_BASED_OUTPATIENT_CLINIC_OR_DEPARTMENT_OTHER): Payer: Medicare Other

## 2011-09-02 DIAGNOSIS — M810 Age-related osteoporosis without current pathological fracture: Secondary | ICD-10-CM | POA: Diagnosis not present

## 2011-09-02 DIAGNOSIS — C50919 Malignant neoplasm of unspecified site of unspecified female breast: Secondary | ICD-10-CM | POA: Diagnosis not present

## 2011-09-02 LAB — BASIC METABOLIC PANEL
BUN: 22 mg/dL (ref 6–23)
CO2: 27 mEq/L (ref 19–32)
Chloride: 105 mEq/L (ref 96–112)
Glucose, Bld: 172 mg/dL — ABNORMAL HIGH (ref 70–99)
Potassium: 4.7 mEq/L (ref 3.5–5.1)
Sodium: 137 mEq/L (ref 135–145)

## 2011-09-02 MED ORDER — SODIUM CHLORIDE 0.9 % IJ SOLN
INTRAMUSCULAR | Status: AC
  Administered 2011-09-02: 10 mL
  Filled 2011-09-02: qty 10

## 2011-09-02 MED ORDER — ZOLEDRONIC ACID 4 MG/5ML IV CONC
4.0000 mg | Freq: Once | INTRAVENOUS | Status: AC
  Administered 2011-09-02: 4 mg via INTRAVENOUS
  Filled 2011-09-02: qty 5

## 2011-09-02 MED ORDER — SODIUM CHLORIDE 0.9 % IJ SOLN
10.0000 mL | INTRAMUSCULAR | Status: DC | PRN
  Administered 2011-09-02: 10 mL
  Filled 2011-09-02: qty 10

## 2011-09-02 MED ORDER — SODIUM CHLORIDE 0.9 % IV SOLN
Freq: Once | INTRAVENOUS | Status: AC
  Administered 2011-09-02: 11:00:00 via INTRAVENOUS

## 2011-09-02 NOTE — Progress Notes (Signed)
Tolerated infusion well. 

## 2011-09-03 ENCOUNTER — Ambulatory Visit: Payer: Medicare Other | Admitting: Orthopedic Surgery

## 2011-09-30 ENCOUNTER — Ambulatory Visit (INDEPENDENT_AMBULATORY_CARE_PROVIDER_SITE_OTHER): Payer: Medicare Other | Admitting: *Deleted

## 2011-09-30 DIAGNOSIS — I4891 Unspecified atrial fibrillation: Secondary | ICD-10-CM

## 2011-09-30 DIAGNOSIS — I6350 Cerebral infarction due to unspecified occlusion or stenosis of unspecified cerebral artery: Secondary | ICD-10-CM | POA: Diagnosis not present

## 2011-09-30 DIAGNOSIS — Z7901 Long term (current) use of anticoagulants: Secondary | ICD-10-CM | POA: Diagnosis not present

## 2011-09-30 DIAGNOSIS — I639 Cerebral infarction, unspecified: Secondary | ICD-10-CM

## 2011-09-30 LAB — POCT INR: INR: 3

## 2011-11-04 DIAGNOSIS — L259 Unspecified contact dermatitis, unspecified cause: Secondary | ICD-10-CM | POA: Diagnosis not present

## 2011-11-11 ENCOUNTER — Ambulatory Visit (INDEPENDENT_AMBULATORY_CARE_PROVIDER_SITE_OTHER): Payer: Medicare Other | Admitting: *Deleted

## 2011-11-11 DIAGNOSIS — I4891 Unspecified atrial fibrillation: Secondary | ICD-10-CM | POA: Diagnosis not present

## 2011-11-11 DIAGNOSIS — Z7901 Long term (current) use of anticoagulants: Secondary | ICD-10-CM

## 2011-11-11 DIAGNOSIS — I6350 Cerebral infarction due to unspecified occlusion or stenosis of unspecified cerebral artery: Secondary | ICD-10-CM

## 2011-11-11 DIAGNOSIS — I639 Cerebral infarction, unspecified: Secondary | ICD-10-CM

## 2011-11-11 LAB — POCT INR: INR: 3

## 2011-12-05 DIAGNOSIS — E119 Type 2 diabetes mellitus without complications: Secondary | ICD-10-CM | POA: Diagnosis not present

## 2011-12-05 DIAGNOSIS — E1159 Type 2 diabetes mellitus with other circulatory complications: Secondary | ICD-10-CM | POA: Diagnosis not present

## 2011-12-20 ENCOUNTER — Other Ambulatory Visit (HOSPITAL_COMMUNITY): Payer: Self-pay | Admitting: Oncology

## 2011-12-23 ENCOUNTER — Ambulatory Visit (INDEPENDENT_AMBULATORY_CARE_PROVIDER_SITE_OTHER): Payer: Medicare Other | Admitting: *Deleted

## 2011-12-23 DIAGNOSIS — Z7901 Long term (current) use of anticoagulants: Secondary | ICD-10-CM | POA: Diagnosis not present

## 2011-12-23 DIAGNOSIS — I6350 Cerebral infarction due to unspecified occlusion or stenosis of unspecified cerebral artery: Secondary | ICD-10-CM | POA: Diagnosis not present

## 2011-12-23 DIAGNOSIS — I639 Cerebral infarction, unspecified: Secondary | ICD-10-CM

## 2011-12-23 DIAGNOSIS — I4891 Unspecified atrial fibrillation: Secondary | ICD-10-CM | POA: Diagnosis not present

## 2011-12-31 DIAGNOSIS — H353290 Exudative age-related macular degeneration, unspecified eye, stage unspecified: Secondary | ICD-10-CM | POA: Diagnosis not present

## 2011-12-31 DIAGNOSIS — E1139 Type 2 diabetes mellitus with other diabetic ophthalmic complication: Secondary | ICD-10-CM | POA: Diagnosis not present

## 2011-12-31 DIAGNOSIS — H35379 Puckering of macula, unspecified eye: Secondary | ICD-10-CM | POA: Diagnosis not present

## 2011-12-31 DIAGNOSIS — E11329 Type 2 diabetes mellitus with mild nonproliferative diabetic retinopathy without macular edema: Secondary | ICD-10-CM | POA: Diagnosis not present

## 2012-01-14 DIAGNOSIS — B029 Zoster without complications: Secondary | ICD-10-CM | POA: Diagnosis not present

## 2012-01-20 ENCOUNTER — Ambulatory Visit (INDEPENDENT_AMBULATORY_CARE_PROVIDER_SITE_OTHER): Payer: Medicare Other | Admitting: *Deleted

## 2012-01-20 DIAGNOSIS — Z7901 Long term (current) use of anticoagulants: Secondary | ICD-10-CM

## 2012-01-20 DIAGNOSIS — I6350 Cerebral infarction due to unspecified occlusion or stenosis of unspecified cerebral artery: Secondary | ICD-10-CM | POA: Diagnosis not present

## 2012-01-20 DIAGNOSIS — I4891 Unspecified atrial fibrillation: Secondary | ICD-10-CM | POA: Diagnosis not present

## 2012-01-20 DIAGNOSIS — I639 Cerebral infarction, unspecified: Secondary | ICD-10-CM

## 2012-01-20 LAB — POCT INR: INR: 2.6

## 2012-01-21 DIAGNOSIS — B029 Zoster without complications: Secondary | ICD-10-CM | POA: Diagnosis not present

## 2012-01-21 DIAGNOSIS — E119 Type 2 diabetes mellitus without complications: Secondary | ICD-10-CM | POA: Diagnosis not present

## 2012-01-21 DIAGNOSIS — E039 Hypothyroidism, unspecified: Secondary | ICD-10-CM | POA: Diagnosis not present

## 2012-02-17 ENCOUNTER — Ambulatory Visit (INDEPENDENT_AMBULATORY_CARE_PROVIDER_SITE_OTHER): Payer: Medicare Other | Admitting: *Deleted

## 2012-02-17 DIAGNOSIS — I4891 Unspecified atrial fibrillation: Secondary | ICD-10-CM | POA: Diagnosis not present

## 2012-02-17 DIAGNOSIS — I6350 Cerebral infarction due to unspecified occlusion or stenosis of unspecified cerebral artery: Secondary | ICD-10-CM

## 2012-02-17 DIAGNOSIS — Z7901 Long term (current) use of anticoagulants: Secondary | ICD-10-CM | POA: Diagnosis not present

## 2012-02-17 DIAGNOSIS — I639 Cerebral infarction, unspecified: Secondary | ICD-10-CM

## 2012-02-17 LAB — POCT INR: INR: 2.7

## 2012-03-16 ENCOUNTER — Ambulatory Visit (INDEPENDENT_AMBULATORY_CARE_PROVIDER_SITE_OTHER): Payer: Medicare Other | Admitting: *Deleted

## 2012-03-16 DIAGNOSIS — Z7901 Long term (current) use of anticoagulants: Secondary | ICD-10-CM

## 2012-03-16 DIAGNOSIS — I639 Cerebral infarction, unspecified: Secondary | ICD-10-CM

## 2012-03-16 DIAGNOSIS — I4891 Unspecified atrial fibrillation: Secondary | ICD-10-CM | POA: Diagnosis not present

## 2012-03-16 DIAGNOSIS — I6350 Cerebral infarction due to unspecified occlusion or stenosis of unspecified cerebral artery: Secondary | ICD-10-CM

## 2012-03-16 LAB — POCT INR: INR: 2.6

## 2012-03-17 DIAGNOSIS — E1159 Type 2 diabetes mellitus with other circulatory complications: Secondary | ICD-10-CM | POA: Diagnosis not present

## 2012-03-17 DIAGNOSIS — E119 Type 2 diabetes mellitus without complications: Secondary | ICD-10-CM | POA: Diagnosis not present

## 2012-04-20 ENCOUNTER — Ambulatory Visit (INDEPENDENT_AMBULATORY_CARE_PROVIDER_SITE_OTHER): Payer: Medicare Other | Admitting: *Deleted

## 2012-04-20 DIAGNOSIS — I6350 Cerebral infarction due to unspecified occlusion or stenosis of unspecified cerebral artery: Secondary | ICD-10-CM | POA: Diagnosis not present

## 2012-04-20 DIAGNOSIS — I4891 Unspecified atrial fibrillation: Secondary | ICD-10-CM | POA: Diagnosis not present

## 2012-04-20 DIAGNOSIS — Z7901 Long term (current) use of anticoagulants: Secondary | ICD-10-CM

## 2012-04-20 DIAGNOSIS — I639 Cerebral infarction, unspecified: Secondary | ICD-10-CM

## 2012-04-20 LAB — POCT INR: INR: 2.6

## 2012-04-24 DIAGNOSIS — R609 Edema, unspecified: Secondary | ICD-10-CM | POA: Diagnosis not present

## 2012-04-24 DIAGNOSIS — M79609 Pain in unspecified limb: Secondary | ICD-10-CM | POA: Diagnosis not present

## 2012-04-24 DIAGNOSIS — Z23 Encounter for immunization: Secondary | ICD-10-CM | POA: Diagnosis not present

## 2012-04-24 DIAGNOSIS — I10 Essential (primary) hypertension: Secondary | ICD-10-CM | POA: Diagnosis not present

## 2012-05-18 ENCOUNTER — Other Ambulatory Visit (HOSPITAL_COMMUNITY): Payer: Self-pay | Admitting: Oncology

## 2012-06-01 ENCOUNTER — Ambulatory Visit (INDEPENDENT_AMBULATORY_CARE_PROVIDER_SITE_OTHER): Payer: Medicare Other | Admitting: *Deleted

## 2012-06-01 DIAGNOSIS — I4891 Unspecified atrial fibrillation: Secondary | ICD-10-CM | POA: Diagnosis not present

## 2012-06-01 DIAGNOSIS — Z7901 Long term (current) use of anticoagulants: Secondary | ICD-10-CM

## 2012-06-01 DIAGNOSIS — I6350 Cerebral infarction due to unspecified occlusion or stenosis of unspecified cerebral artery: Secondary | ICD-10-CM | POA: Diagnosis not present

## 2012-06-01 DIAGNOSIS — I639 Cerebral infarction, unspecified: Secondary | ICD-10-CM

## 2012-06-01 LAB — POCT INR: INR: 2.1

## 2012-06-10 ENCOUNTER — Encounter (HOSPITAL_COMMUNITY): Payer: Medicare Other | Attending: Oncology | Admitting: Oncology

## 2012-06-10 VITALS — BP 167/71 | HR 78 | Temp 97.8°F | Resp 20 | Wt 138.1 lb

## 2012-06-10 DIAGNOSIS — Z853 Personal history of malignant neoplasm of breast: Secondary | ICD-10-CM

## 2012-06-10 DIAGNOSIS — M810 Age-related osteoporosis without current pathological fracture: Secondary | ICD-10-CM | POA: Diagnosis not present

## 2012-06-10 DIAGNOSIS — I4891 Unspecified atrial fibrillation: Secondary | ICD-10-CM

## 2012-06-10 DIAGNOSIS — C50919 Malignant neoplasm of unspecified site of unspecified female breast: Secondary | ICD-10-CM

## 2012-06-10 DIAGNOSIS — I872 Venous insufficiency (chronic) (peripheral): Secondary | ICD-10-CM | POA: Diagnosis not present

## 2012-06-10 NOTE — Progress Notes (Signed)
Problem number 1 stage I adenocarcinoma the left breast (T1 C., N0) status post left modified radical mastectomy in July 1995 for 1.4 cm cancer. Lymph nodes are negative. Your receptors negative but your Septra 30% positive. She was treated 5 years of tamoxifen after her surgery. Problem #2 A. fib on Coumadin Problem #3 hypertension Problem #4 osteoporosis on therapy Problem #5 diabetes mellitus under good control Problem #6 left hip fracture in October 2007 repaired by Dr. 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 She has some aching in the bottoms of her feet when she stands on them for some period of time. Nothing is visible. She also has pain in the base of the right toe on the right foot that is the big toe on the right foot. She has chronic swelling of the lower legs left greater than right which is not new or different. Abdomen is soft and nontender. Left chest wall is negative. Right breast is negative. She has no lymphadenopathy in any location. Lungs are clear. Heart shows an irregularly irregular rhythm with a grade 2/6 systolic ejection murmur.  She has no evidence for ongoing or recurrent disease. We will see her back in a year and she will continue therapy for her osteoporosis

## 2012-06-23 DIAGNOSIS — E119 Type 2 diabetes mellitus without complications: Secondary | ICD-10-CM | POA: Diagnosis not present

## 2012-06-23 DIAGNOSIS — E1159 Type 2 diabetes mellitus with other circulatory complications: Secondary | ICD-10-CM | POA: Diagnosis not present

## 2012-07-13 ENCOUNTER — Ambulatory Visit (INDEPENDENT_AMBULATORY_CARE_PROVIDER_SITE_OTHER): Payer: Medicare Other | Admitting: *Deleted

## 2012-07-13 DIAGNOSIS — I4891 Unspecified atrial fibrillation: Secondary | ICD-10-CM

## 2012-07-13 DIAGNOSIS — Z7901 Long term (current) use of anticoagulants: Secondary | ICD-10-CM | POA: Diagnosis not present

## 2012-07-13 DIAGNOSIS — I6350 Cerebral infarction due to unspecified occlusion or stenosis of unspecified cerebral artery: Secondary | ICD-10-CM | POA: Diagnosis not present

## 2012-07-13 DIAGNOSIS — I639 Cerebral infarction, unspecified: Secondary | ICD-10-CM

## 2012-07-13 LAB — POCT INR: INR: 3

## 2012-07-17 DIAGNOSIS — F0390 Unspecified dementia without behavioral disturbance: Secondary | ICD-10-CM | POA: Diagnosis not present

## 2012-08-24 ENCOUNTER — Ambulatory Visit (INDEPENDENT_AMBULATORY_CARE_PROVIDER_SITE_OTHER): Payer: Medicare Other | Admitting: *Deleted

## 2012-08-24 DIAGNOSIS — I639 Cerebral infarction, unspecified: Secondary | ICD-10-CM

## 2012-08-24 DIAGNOSIS — Z7901 Long term (current) use of anticoagulants: Secondary | ICD-10-CM | POA: Diagnosis not present

## 2012-08-24 DIAGNOSIS — I6350 Cerebral infarction due to unspecified occlusion or stenosis of unspecified cerebral artery: Secondary | ICD-10-CM | POA: Diagnosis not present

## 2012-08-24 DIAGNOSIS — I4891 Unspecified atrial fibrillation: Secondary | ICD-10-CM

## 2012-08-31 ENCOUNTER — Other Ambulatory Visit (HOSPITAL_COMMUNITY): Payer: Medicare Other

## 2012-09-01 ENCOUNTER — Encounter (HOSPITAL_COMMUNITY): Payer: Medicare Other | Attending: Oncology

## 2012-09-01 ENCOUNTER — Encounter (HOSPITAL_BASED_OUTPATIENT_CLINIC_OR_DEPARTMENT_OTHER): Payer: Medicare Other

## 2012-09-01 DIAGNOSIS — M1710 Unilateral primary osteoarthritis, unspecified knee: Secondary | ICD-10-CM | POA: Insufficient documentation

## 2012-09-01 DIAGNOSIS — M1711 Unilateral primary osteoarthritis, right knee: Secondary | ICD-10-CM

## 2012-09-01 DIAGNOSIS — M810 Age-related osteoporosis without current pathological fracture: Secondary | ICD-10-CM

## 2012-09-01 LAB — BASIC METABOLIC PANEL
BUN: 25 mg/dL — ABNORMAL HIGH (ref 6–23)
Chloride: 103 mEq/L (ref 96–112)
Creatinine, Ser: 0.69 mg/dL (ref 0.50–1.10)
GFR calc Af Amer: 87 mL/min — ABNORMAL LOW (ref >90)
GFR calc non Af Amer: 75 mL/min — ABNORMAL LOW (ref >90)
Glucose, Bld: 163 mg/dL — ABNORMAL HIGH (ref 70–99)
Potassium: 4.1 mEq/L (ref 3.5–5.1)

## 2012-09-01 MED ORDER — ZOLEDRONIC ACID 4 MG/5ML IV CONC
4.0000 mg | Freq: Once | INTRAVENOUS | Status: AC
  Administered 2012-09-01: 4 mg via INTRAVENOUS
  Filled 2012-09-01: qty 5

## 2012-09-01 MED ORDER — SODIUM CHLORIDE 0.9 % IV SOLN
Freq: Once | INTRAVENOUS | Status: AC
  Administered 2012-09-01: 13:00:00 via INTRAVENOUS

## 2012-09-01 MED ORDER — SODIUM CHLORIDE 0.9 % IJ SOLN
10.0000 mL | INTRAMUSCULAR | Status: DC | PRN
Start: 2012-09-01 — End: 2012-09-01
  Administered 2012-09-01: 10 mL
  Filled 2012-09-01: qty 10

## 2012-09-01 NOTE — Progress Notes (Signed)
Labs drawn today for bmp 

## 2012-09-01 NOTE — Progress Notes (Signed)
Tolerated zometa infusion well.  Iv in right AC.  Good blood return.

## 2012-09-21 ENCOUNTER — Ambulatory Visit (INDEPENDENT_AMBULATORY_CARE_PROVIDER_SITE_OTHER): Payer: Medicare Other | Admitting: *Deleted

## 2012-09-21 DIAGNOSIS — I4891 Unspecified atrial fibrillation: Secondary | ICD-10-CM

## 2012-09-21 DIAGNOSIS — Z7901 Long term (current) use of anticoagulants: Secondary | ICD-10-CM

## 2012-09-21 DIAGNOSIS — I6350 Cerebral infarction due to unspecified occlusion or stenosis of unspecified cerebral artery: Secondary | ICD-10-CM

## 2012-09-21 DIAGNOSIS — I639 Cerebral infarction, unspecified: Secondary | ICD-10-CM

## 2012-09-28 DIAGNOSIS — H348392 Tributary (branch) retinal vein occlusion, unspecified eye, stable: Secondary | ICD-10-CM | POA: Diagnosis not present

## 2012-09-29 ENCOUNTER — Telehealth (HOSPITAL_COMMUNITY): Payer: Self-pay

## 2012-09-29 ENCOUNTER — Other Ambulatory Visit (HOSPITAL_COMMUNITY): Payer: Medicare Other

## 2012-09-29 DIAGNOSIS — C50919 Malignant neoplasm of unspecified site of unspecified female breast: Secondary | ICD-10-CM

## 2012-09-29 DIAGNOSIS — R252 Cramp and spasm: Secondary | ICD-10-CM

## 2012-09-29 NOTE — Telephone Encounter (Signed)
Call from patient with complaints of cramping in left calf muscle off and on since she received zometa on 09/01/12.  Cramping was more severe on Sunday and today.  Wants to know if cramping is caused by the zometa and what to do?

## 2012-09-29 NOTE — Telephone Encounter (Signed)
Patient notified that MD wants her to come in for blood work to see if we can determine the cause of her muscle cramps.  Scheduled to come in on 2/19 @ 1:20pm.

## 2012-09-30 ENCOUNTER — Encounter (HOSPITAL_COMMUNITY): Payer: Medicare Other | Attending: Oncology

## 2012-09-30 DIAGNOSIS — C50919 Malignant neoplasm of unspecified site of unspecified female breast: Secondary | ICD-10-CM | POA: Insufficient documentation

## 2012-09-30 DIAGNOSIS — R252 Cramp and spasm: Secondary | ICD-10-CM | POA: Insufficient documentation

## 2012-09-30 DIAGNOSIS — Z853 Personal history of malignant neoplasm of breast: Secondary | ICD-10-CM | POA: Diagnosis not present

## 2012-09-30 LAB — BASIC METABOLIC PANEL
BUN: 22 mg/dL (ref 6–23)
Chloride: 102 mEq/L (ref 96–112)
Creatinine, Ser: 0.79 mg/dL (ref 0.50–1.10)
GFR calc Af Amer: 83 mL/min — ABNORMAL LOW (ref >90)
Glucose, Bld: 108 mg/dL — ABNORMAL HIGH (ref 70–99)
Potassium: 4.1 mEq/L (ref 3.5–5.1)

## 2012-09-30 NOTE — Progress Notes (Signed)
[name] presented for labwork. Labs per MD order drawn via Peripheral Line 25 gauge needle inserted in rt ac.  Good blood return present. Procedure without incident.  Needle removed intact. Patient tolerated procedure well.

## 2012-10-06 DIAGNOSIS — E1159 Type 2 diabetes mellitus with other circulatory complications: Secondary | ICD-10-CM | POA: Diagnosis not present

## 2012-10-15 ENCOUNTER — Ambulatory Visit (INDEPENDENT_AMBULATORY_CARE_PROVIDER_SITE_OTHER): Payer: Medicare Other | Admitting: *Deleted

## 2012-10-15 DIAGNOSIS — I4891 Unspecified atrial fibrillation: Secondary | ICD-10-CM | POA: Diagnosis not present

## 2012-10-15 DIAGNOSIS — I639 Cerebral infarction, unspecified: Secondary | ICD-10-CM

## 2012-10-19 DIAGNOSIS — E119 Type 2 diabetes mellitus without complications: Secondary | ICD-10-CM | POA: Diagnosis not present

## 2012-10-23 ENCOUNTER — Encounter: Payer: Self-pay | Admitting: Cardiology

## 2012-11-11 ENCOUNTER — Encounter: Payer: Self-pay | Admitting: Oncology

## 2012-11-12 ENCOUNTER — Ambulatory Visit (INDEPENDENT_AMBULATORY_CARE_PROVIDER_SITE_OTHER): Payer: Medicare Other | Admitting: *Deleted

## 2012-11-12 DIAGNOSIS — Z7901 Long term (current) use of anticoagulants: Secondary | ICD-10-CM | POA: Diagnosis not present

## 2012-11-12 DIAGNOSIS — I6350 Cerebral infarction due to unspecified occlusion or stenosis of unspecified cerebral artery: Secondary | ICD-10-CM | POA: Diagnosis not present

## 2012-11-12 DIAGNOSIS — I639 Cerebral infarction, unspecified: Secondary | ICD-10-CM

## 2012-11-12 DIAGNOSIS — I4891 Unspecified atrial fibrillation: Secondary | ICD-10-CM | POA: Diagnosis not present

## 2012-11-12 LAB — POCT INR: INR: 2.1

## 2012-12-10 ENCOUNTER — Ambulatory Visit (INDEPENDENT_AMBULATORY_CARE_PROVIDER_SITE_OTHER): Payer: Medicare Other | Admitting: *Deleted

## 2012-12-10 DIAGNOSIS — Z7901 Long term (current) use of anticoagulants: Secondary | ICD-10-CM

## 2012-12-10 DIAGNOSIS — I6350 Cerebral infarction due to unspecified occlusion or stenosis of unspecified cerebral artery: Secondary | ICD-10-CM

## 2012-12-10 DIAGNOSIS — I4891 Unspecified atrial fibrillation: Secondary | ICD-10-CM | POA: Diagnosis not present

## 2012-12-10 DIAGNOSIS — I639 Cerebral infarction, unspecified: Secondary | ICD-10-CM

## 2012-12-10 LAB — POCT INR: INR: 2.7

## 2012-12-15 DIAGNOSIS — E119 Type 2 diabetes mellitus without complications: Secondary | ICD-10-CM | POA: Diagnosis not present

## 2012-12-15 DIAGNOSIS — E1159 Type 2 diabetes mellitus with other circulatory complications: Secondary | ICD-10-CM | POA: Diagnosis not present

## 2013-01-14 ENCOUNTER — Ambulatory Visit (INDEPENDENT_AMBULATORY_CARE_PROVIDER_SITE_OTHER): Payer: Medicare Other | Admitting: *Deleted

## 2013-01-14 DIAGNOSIS — Z7901 Long term (current) use of anticoagulants: Secondary | ICD-10-CM

## 2013-01-14 DIAGNOSIS — I6350 Cerebral infarction due to unspecified occlusion or stenosis of unspecified cerebral artery: Secondary | ICD-10-CM

## 2013-01-14 DIAGNOSIS — I4891 Unspecified atrial fibrillation: Secondary | ICD-10-CM | POA: Diagnosis not present

## 2013-01-14 DIAGNOSIS — I639 Cerebral infarction, unspecified: Secondary | ICD-10-CM

## 2013-01-14 LAB — POCT INR: INR: 2.5

## 2013-02-23 DIAGNOSIS — E119 Type 2 diabetes mellitus without complications: Secondary | ICD-10-CM | POA: Diagnosis not present

## 2013-02-23 DIAGNOSIS — E1159 Type 2 diabetes mellitus with other circulatory complications: Secondary | ICD-10-CM | POA: Diagnosis not present

## 2013-02-25 ENCOUNTER — Ambulatory Visit (INDEPENDENT_AMBULATORY_CARE_PROVIDER_SITE_OTHER): Payer: Medicare Other | Admitting: *Deleted

## 2013-02-25 DIAGNOSIS — E039 Hypothyroidism, unspecified: Secondary | ICD-10-CM | POA: Diagnosis not present

## 2013-02-25 DIAGNOSIS — Z7901 Long term (current) use of anticoagulants: Secondary | ICD-10-CM | POA: Diagnosis not present

## 2013-02-25 DIAGNOSIS — E785 Hyperlipidemia, unspecified: Secondary | ICD-10-CM | POA: Diagnosis not present

## 2013-02-25 DIAGNOSIS — E119 Type 2 diabetes mellitus without complications: Secondary | ICD-10-CM | POA: Diagnosis not present

## 2013-02-25 DIAGNOSIS — I639 Cerebral infarction, unspecified: Secondary | ICD-10-CM

## 2013-02-25 DIAGNOSIS — I4891 Unspecified atrial fibrillation: Secondary | ICD-10-CM

## 2013-02-25 DIAGNOSIS — I6350 Cerebral infarction due to unspecified occlusion or stenosis of unspecified cerebral artery: Secondary | ICD-10-CM | POA: Diagnosis not present

## 2013-03-10 ENCOUNTER — Encounter: Payer: Self-pay | Admitting: Cardiology

## 2013-03-12 DIAGNOSIS — T148XXA Other injury of unspecified body region, initial encounter: Secondary | ICD-10-CM | POA: Diagnosis not present

## 2013-03-16 DIAGNOSIS — T148XXA Other injury of unspecified body region, initial encounter: Secondary | ICD-10-CM | POA: Diagnosis not present

## 2013-03-29 DIAGNOSIS — H353290 Exudative age-related macular degeneration, unspecified eye, stage unspecified: Secondary | ICD-10-CM | POA: Diagnosis not present

## 2013-03-29 DIAGNOSIS — H35359 Cystoid macular degeneration, unspecified eye: Secondary | ICD-10-CM | POA: Diagnosis not present

## 2013-03-29 DIAGNOSIS — E11339 Type 2 diabetes mellitus with moderate nonproliferative diabetic retinopathy without macular edema: Secondary | ICD-10-CM | POA: Diagnosis not present

## 2013-03-29 DIAGNOSIS — E1139 Type 2 diabetes mellitus with other diabetic ophthalmic complication: Secondary | ICD-10-CM | POA: Diagnosis not present

## 2013-04-07 ENCOUNTER — Ambulatory Visit (INDEPENDENT_AMBULATORY_CARE_PROVIDER_SITE_OTHER): Payer: Medicare Other | Admitting: Cardiology

## 2013-04-07 ENCOUNTER — Ambulatory Visit (INDEPENDENT_AMBULATORY_CARE_PROVIDER_SITE_OTHER): Payer: Medicare Other | Admitting: *Deleted

## 2013-04-07 ENCOUNTER — Encounter: Payer: Self-pay | Admitting: Cardiology

## 2013-04-07 VITALS — BP 179/85 | HR 64 | Ht 60.0 in | Wt 135.0 lb

## 2013-04-07 DIAGNOSIS — E785 Hyperlipidemia, unspecified: Secondary | ICD-10-CM

## 2013-04-07 DIAGNOSIS — I6350 Cerebral infarction due to unspecified occlusion or stenosis of unspecified cerebral artery: Secondary | ICD-10-CM | POA: Diagnosis not present

## 2013-04-07 DIAGNOSIS — I4891 Unspecified atrial fibrillation: Secondary | ICD-10-CM

## 2013-04-07 DIAGNOSIS — I639 Cerebral infarction, unspecified: Secondary | ICD-10-CM

## 2013-04-07 DIAGNOSIS — I7090 Unspecified atherosclerosis: Secondary | ICD-10-CM | POA: Diagnosis not present

## 2013-04-07 DIAGNOSIS — I2510 Atherosclerotic heart disease of native coronary artery without angina pectoris: Secondary | ICD-10-CM | POA: Diagnosis not present

## 2013-04-07 DIAGNOSIS — I10 Essential (primary) hypertension: Secondary | ICD-10-CM

## 2013-04-07 DIAGNOSIS — Z7901 Long term (current) use of anticoagulants: Secondary | ICD-10-CM

## 2013-04-07 DIAGNOSIS — R001 Bradycardia, unspecified: Secondary | ICD-10-CM

## 2013-04-07 DIAGNOSIS — I498 Other specified cardiac arrhythmias: Secondary | ICD-10-CM | POA: Diagnosis not present

## 2013-04-07 DIAGNOSIS — C50919 Malignant neoplasm of unspecified site of unspecified female breast: Secondary | ICD-10-CM

## 2013-04-07 LAB — POCT INR: INR: 2.7

## 2013-04-07 MED ORDER — DILTIAZEM HCL ER COATED BEADS 180 MG PO CP24: 180.0000 mg | ORAL_CAPSULE | Freq: Every day | ORAL | Status: DC

## 2013-04-07 MED ORDER — TERAZOSIN HCL 5 MG PO CAPS: 5.0000 mg | ORAL_CAPSULE | Freq: Every day | ORAL | Status: DC

## 2013-04-07 MED ORDER — LOSARTAN POTASSIUM 100 MG PO TABS: 100.0000 mg | ORAL_TABLET | Freq: Every day | ORAL | Status: DC

## 2013-04-07 MED ORDER — TERAZOSIN HCL 1 MG PO CAPS: 1.0000 mg | ORAL_CAPSULE | Freq: Every day | ORAL | Status: DC

## 2013-04-07 NOTE — Progress Notes (Deleted)
Name: [name]    DOB: [birth year]  Age: 79 y.o.  MR#: [id number]       PCP:  [name], MD      Insurance: Payor: MEDICARE / Plan: MEDICARE PART A AND B / Product Type: *No Product type* /   CC:   No chief complaint on file. LIST  VS Filed Vitals:   04/07/13 1426  BP: 179/85  Pulse: 64  Height: 5' (1.524 m)  Weight: 135 lb (61.236 kg)    Weights Current Weight  04/07/13 135 lb (61.236 kg)  06/10/12 138 lb 1.6 oz (62.642 kg)  08/21/11 139 lb 15.9 oz (63.5 kg)    Blood Pressure  BP Readings from Last 3 Encounters:  04/07/13 179/85  06/10/12 167/71  09/02/11 147/69     Admit date:  (Not on file) Last encounter with RMR:  Visit date not found   Allergy Review of patient's allergies indicates no known allergies.  Current Outpatient Prescriptions  Medication Sig Dispense Refill  . aspirin 81 MG EC tablet Take 81 mg by mouth every morning.       Marland Kitchen b complex-vitamin c-folic acid (NEPHRO-VITE) 0.8 MG TABS Take 0.8 mg by mouth at bedtime.        . beta carotene w/minerals (OCUVITE) tablet Take 1 tablet by mouth daily.        . calcium-vitamin D (OSCAL 500/200 D-3) 500-200 MG-UNIT per tablet Take 1 tablet by mouth daily.        . digoxin (LANOXIN) 0.125 MG tablet Take 125 mcg by mouth daily.        Marland Kitchen diltiazem (CARDIZEM CD) 240 MG 24 hr capsule Take 240 mg by mouth at bedtime.       . EVISTA 60 MG tablet take 1 tablet by mouth once daily  30 each  4  . fexofenadine (ALLEGRA) 180 MG tablet Take 180 mg by mouth daily.        . furosemide (LASIX) 20 MG tablet Take 20 mg by mouth daily.        Marland Kitchen glipiZIDE (GLUCOTROL) 5 MG tablet Take 5 mg by mouth daily.       Marland Kitchen levothyroxine (SYNTHROID, LEVOTHROID) 25 MCG tablet Take 25 mcg by mouth every morning.       Marland Kitchen losartan (COZAAR) 50 MG tablet Take 50 mg by mouth daily.        . metFORMIN (GLUCOPHAGE) 500 MG tablet Take 500 mg by mouth 2 (two) times daily with a meal.        . nebivolol (BYSTOLIC) 10 MG tablet Take 10 mg by mouth daily.         Marland Kitchen omeprazole (PRILOSEC) 20 MG capsule Take 20 mg by mouth 2 (two) times daily.        . potassium chloride SA (K-DUR,KLOR-CON) 20 MEQ tablet Take 20 mEq by mouth daily.        . raloxifene (EVISTA) 60 MG tablet Take 60 mg by mouth daily.       . rosuvastatin (CRESTOR) 10 MG tablet Take 10 mg by mouth at bedtime as needed and may repeat dose one time if needed.        . warfarin (COUMADIN) 2 MG tablet Take 2-4 mg by mouth as directed. Takes 4 mg on Mondays and Fridays and 2 mg the rest of the week. **Takes at bedtime**       No current facility-administered medications for this visit.    Discontinued Meds:  Medications Discontinued During This Encounter  Medication Reason  . traMADol-acetaminophen (ULTRACET) 37.5-325 MG per tablet Error  . predniSONE (DELTASONE) 10 MG tablet Error    Patient Active Problem List   Diagnosis Date Noted  . Lower extremity edema 08/20/2011  . Bradycardia 08/20/2011  . Arteriosclerotic cardiovascular disease (ASCVD)   . Carcinoma of breast   . Atrial fibrillation   . MVP (mitral valve prolapse)   . PVD (peripheral vascular disease)   . CVD (cerebrovascular disease)   . Hypothyroid   . Osteoporosis   . Chronic anticoagulation 11/22/2010  . HERPES ZOSTER 11/21/2009  . DIABETES MELLITUS, TYPE II 11/21/2009  . Hyperlipidemia 11/21/2009  . Hypertension 11/21/2009  . Chronic diastolic heart failure 11/21/2009  . SPINAL STENOSIS, LUMBAR 09/05/2008  . FRACTURE, FEMUR, INTERTROCHANTERIC REGION 08/24/2008    LABS    Component Value Date/Time   NA 139 09/30/2012 1318   NA 139 09/01/2012 1201   NA 137 09/02/2011 1002   K 4.1 09/30/2012 1318   K 4.1 09/01/2012 1201   K 4.7 09/02/2011 1002   CL 102 09/30/2012 1318   CL 103 09/01/2012 1201   CL 105 09/02/2011 1002   CO2 28 09/30/2012 1318   CO2 27 09/01/2012 1201   CO2 27 09/02/2011 1002   GLUCOSE 108* 09/30/2012 1318   GLUCOSE 163* 09/01/2012 1201   GLUCOSE 172* 09/02/2011 1002   BUN 22 09/30/2012 1318    BUN 25* 09/01/2012 1201   BUN 22 09/02/2011 1002   CREATININE 0.79 09/30/2012 1318   CREATININE 0.69 09/01/2012 1201   CREATININE 0.72 09/02/2011 1002   CREATININE 0.79 03/18/2011 1510   CALCIUM 9.7 09/30/2012 1318   CALCIUM 9.2 09/01/2012 1201   CALCIUM 9.6 09/02/2011 1002   GFRNONAA 72* 09/30/2012 1318   GFRNONAA 75* 09/01/2012 1201   GFRNONAA 74* 09/02/2011 1002   GFRAA 83* 09/30/2012 1318   GFRAA 87* 09/01/2012 1201   GFRAA 86* 09/02/2011 1002   CMP     Component Value Date/Time   NA 139 09/30/2012 1318   K 4.1 09/30/2012 1318   CL 102 09/30/2012 1318   CO2 28 09/30/2012 1318   GLUCOSE 108* 09/30/2012 1318   BUN 22 09/30/2012 1318   CREATININE 0.79 09/30/2012 1318   CREATININE 0.79 03/18/2011 1510   CALCIUM 9.7 09/30/2012 1318   PROT 6.8 08/19/2011 1234   ALBUMIN 3.4* 08/19/2011 1234   AST 16 08/19/2011 1234   ALT 17 08/19/2011 1234   ALKPHOS 65 08/19/2011 1234   BILITOT 0.4 08/19/2011 1234   GFRNONAA 72* 09/30/2012 1318   GFRAA 83* 09/30/2012 1318       Component Value Date/Time   WBC 8.0 08/21/2011 0451   WBC 10.3 08/20/2011 1148   WBC 9.9 08/19/2011 1234   HGB 13.2 08/21/2011 0451   HGB 12.7 08/20/2011 1148   HGB 13.4 08/19/2011 1234   HCT 40.3 08/21/2011 0451   HCT 39.0 08/20/2011 1148   HCT 41.5 08/19/2011 1234   MCV 90.2 08/21/2011 0451   MCV 90.3 08/20/2011 1148   MCV 91.2 08/19/2011 1234    Lipid Panel     Component Value Date/Time   CHOL  Value: 120        ATP III CLASSIFICATION:  <200     mg/dL   Desirable  409-811  mg/dL   Borderline High  >=914    mg/dL   High        02/17/2955 0615   TRIG 74 10/19/2010 0615  HDL 39* 10/19/2010 0615   CHOLHDL 3.1 10/19/2010 0615   VLDL 15 10/19/2010 0615   LDLCALC  Value: 66        Total Cholesterol/HDL:CHD Risk Coronary Heart Disease Risk Table                     Men   Women  1/2 Average Risk   3.4   3.3  Average Risk       5.0   4.4  2 X Average Risk   9.6   7.1  3 X Average Risk  23.4   11.0        Use the calculated Patient Ratio above and the CHD Risk Table to determine  the patient's CHD Risk.        ATP III CLASSIFICATION (LDL):  <100     mg/dL   Optimal  016-010  mg/dL   Near or Above                    Optimal  130-159  mg/dL   Borderline  932-355  mg/dL   High  >732     mg/dL   Very High 2/0/2542 7062    ABG No results found for this basename: phart, pco2, pco2art, po2, po2art, hco3, tco2, acidbasedef, o2sat     Lab Results  Component Value Date   TSH 0.742 08/21/2011   BNP (last 3 results) No results found for this basename: PROBNP,  in the last 8760 hours Cardiac Panel (last 3 results) No results found for this basename: CKTOTAL, CKMB, TROPONINI, RELINDX,  in the last 72 hours  Iron/TIBC/Ferritin    Component Value Date/Time   FERRITIN 38 05/01/2007 1318     EKG Orders placed in visit on 04/07/13  . EKG 12-LEAD     Prior Assessment and Plan Problem List as of 04/07/2013     Cardiovascular and Mediastinum   Hypertension   Last Assessment & Plan   03/18/2011 Office Visit Edited 03/18/2011  5:12 PM by Kathlen Brunswick, MD     Blood pressure control is marginal, but patient reports invariably good results when blood pressures assessed outside of a healthcare setting.  She will collect her values for [place], which will be reviewed at her next visit.  A metabolic profile and CBC will be obtained.    Chronic diastolic heart failure   Last Assessment & Plan   03/18/2011 Office Visit Written 03/18/2011  5:05 PM by Kathlen Brunswick, MD     No apparent active congestive heart failure at the present time.    Arteriosclerotic cardiovascular disease (ASCVD)   Atrial fibrillation   Last Assessment & Plan   03/18/2011 Office Visit Written 03/21/2011 10:55 PM by Kathlen Brunswick, MD     Atrial fibrillation is chronic and asymptomatic.  We will continue anticoagulation and our rate control strategy.     MVP (mitral valve prolapse)   PVD (peripheral vascular disease)   CVD (cerebrovascular disease)     Endocrine   DIABETES MELLITUS, TYPE II   Hypothyroid      Musculoskeletal and Integument   FRACTURE, FEMUR, INTERTROCHANTERIC REGION   Osteoporosis     Other   Lower extremity edema   HERPES ZOSTER   Hyperlipidemia   Last Assessment & Plan   03/18/2011 Office Visit Written 03/18/2011  5:08 PM by Kathlen Brunswick, MD     Excellent lipid profile when last assessed 6 months ago.  We will  continue to monitor.    SPINAL STENOSIS, LUMBAR   Last Assessment & Plan   03/18/2011 Office Visit Written 03/18/2011  5:13 PM by Kathlen Brunswick, MD     Ms. Poirier has chronic back pain with left leg discomfort as well, which may be related to degenerative joint disease of the lumbosacral spine.  She's been taking Tylenol without much relief.  She is worried about taking nonsteroidals in the setting of full anticoagulation.  I provided her with a prescription for Ultracet.  If this is ineffective, I would encourage her to use nonsteroidals at the lowest effective dose once or twice a day.    Chronic anticoagulation   Last Assessment & Plan   03/18/2011 Office Visit Written 03/18/2011  5:06 PM by Kathlen Brunswick, MD     Anticoagulation has been stable and therapeutic.  A CBC and stool for Hemoccult testing will be obtained to exclude occult GI blood loss.    Carcinoma of breast   Bradycardia       Imaging: No results found.

## 2013-04-07 NOTE — Progress Notes (Signed)
Patient ID: [name], female   DOB: September 30, 1920, 79 y.o.   MRN: [id number]  HPI: Scheduled return visit for continuing assessment and treatment of atrial fibrillation and valvular heart disease in this very pleasant older woman. She has done well in recent months with adequate exercise tolerance and no chest pain or dyspnea.  Some weeks ago, she experienced pain and swelling in her left leg over the distal fibula. She was evaluated and treated in an urgent care center with resolution of her symptoms; however, the diagnosis is unclear based upon the patient's description.  Current Outpatient Prescriptions  Medication Sig Dispense Refill  . aspirin 81 MG EC tablet Take 81 mg by mouth every morning.       Marland Kitchen b complex-vitamin c-folic acid (NEPHRO-VITE) 0.8 MG TABS Take 0.8 mg by mouth at bedtime.        . beta carotene w/minerals (OCUVITE) tablet Take 1 tablet by mouth daily.        . calcium-vitamin D (OSCAL 500/200 D-3) 500-200 MG-UNIT per tablet Take 1 tablet by mouth daily.        . digoxin (LANOXIN) 0.125 MG tablet Take 125 mcg by mouth daily.        Marland Kitchen diltiazem (CARDIZEM CD) 180 MG 24 hr capsule Take 1 capsule (180 mg total) by mouth daily.  90 capsule  3  . EVISTA 60 MG tablet take 1 tablet by mouth once daily  30 each  4  . fexofenadine (ALLEGRA) 180 MG tablet Take 180 mg by mouth daily.        . furosemide (LASIX) 20 MG tablet Take 20 mg by mouth daily.        Marland Kitchen glipiZIDE (GLUCOTROL) 5 MG tablet Take 5 mg by mouth daily.       Marland Kitchen levothyroxine (SYNTHROID, LEVOTHROID) 25 MCG tablet Take 25 mcg by mouth every morning.       Marland Kitchen losartan (COZAAR) 100 MG tablet Take 1 tablet (100 mg total) by mouth daily.  90 tablet  3  . metFORMIN (GLUCOPHAGE) 500 MG tablet Take 500 mg by mouth 2 (two) times daily with a meal.        . nebivolol (BYSTOLIC) 10 MG tablet Take 10 mg by mouth daily.        Marland Kitchen omeprazole (PRILOSEC) 20 MG capsule Take 20 mg by mouth 2 (two) times daily.        . potassium chloride SA  (K-DUR,KLOR-CON) 20 MEQ tablet Take 20 mEq by mouth daily.        . raloxifene (EVISTA) 60 MG tablet Take 60 mg by mouth daily.       . rosuvastatin (CRESTOR) 10 MG tablet Take 10 mg by mouth at bedtime as needed and may repeat dose one time if needed.        . terazosin (HYTRIN) 1 MG capsule Take 1 capsule (1 mg total) by mouth at bedtime.  30 capsule  0  . terazosin (HYTRIN) 5 MG capsule Take 1 capsule (5 mg total) by mouth at bedtime.  90 capsule  1  . warfarin (COUMADIN) 2 MG tablet Take 2-4 mg by mouth as directed. Takes 4 mg on Mondays and Fridays and 2 mg the rest of the week. **Takes at bedtime**       No current facility-administered medications for this visit.   No Known Allergies   Past medical history, social history, and family history reviewed and updated.  ROS: Denies palpitations, lightheadedness or syncope. All  other systems reviewed and are negative.  PHYSICAL EXAM: BP 179/85  Pulse 64  Ht 5' (1.524 m)  Wt 61.236 kg (135 lb)  BMI 26.37 kg/m2;  Body mass index is 26.37 kg/(m^2). General-Well developed; no acute distress Body habitus-proportionate weight and height Neck-No JVD; no carotid bruits Lungs-clear lung fields; resonant to percussion; moderate kyphosis Cardiovascular-normal PMI; normal S1 and S2 Abdomen-normal bowel sounds; soft and non-tender without masses or organomegaly Musculoskeletal-No deformities, no cyanosis or clubbing Neurologic-Normal cranial nerves; symmetric strength and tone Skin-Warm, no significant lesions Extremities-distal pulses intact; 1-2+ edema  EKG: Technically suboptimal tracing; atrial fibrillation with a slow ventricular response; ventricular rate of 57 bpm; nonspecific ST segment abnormality; no previous tracing for comparison.  Lewiston Bing, MD 04/07/2013  2:33 PM  ASSESSMENT AND PLAN

## 2013-04-07 NOTE — Assessment & Plan Note (Signed)
She reports taking rosuvastatin only approximately 3 days per week, but this appears to be providing adequate control of hyperlipidemia based upon a recent profile.

## 2013-04-07 NOTE — Assessment & Plan Note (Signed)
Control of hypertension is inadequate. Dose of losartan will be increased 100 mg per day. Terazosin will be added to medical regime and titrated during continued monitoring of BP.

## 2013-04-07 NOTE — Patient Instructions (Addendum)
Your physician recommends that you schedule a follow-up appointment in: 6 MONTHS WITH MD/NP/PA AND ONE MONTH WITH NURSE TO RE-CHECK BLOOD PRESSURE  Your physician has requested that you regularly monitor and record your blood pressure readings at home. Please use the same machine at the same time of day to check your readings and record them to bring to your follow-up visit.BRING RECORDS WITH YOU AT NEXT OFFICE VISIT  Your Physician recommends that you complete the Hemoccult package given to you today, instructions are enclosed in your packet. Once completed please return to this office.  Your physician has recommended you make the following change in your medication:   1) STOP TAKING ASPIRIN 2) DECREASE DILTIAZEM TO 180MG  WITH YOUR NEXT REFILL, COMPLETE CURRENT DOSAGE FIRST 3) INCREASE LOSARTAN TO 100MG  ONCE DAILY 4) START TAKING HYTRIN 1MG  EVERY NIGHT AT BEDTIME FOR 3 DAYS, THEN INCREASE TO 2MG  (TAKE TWO OF THE ONE MG TABLETS TO EQUAL 2MG ) EVERY NIGHT AT BEDTIME FOR 3 DAYS, THEN INCREASE TO 5 MG ONCE DAILY ONGOING

## 2013-04-07 NOTE — Assessment & Plan Note (Addendum)
No clinical evidence for progression of disease or significant ischemia for at least a decade; we will continue to attempt to optimize treatment of cardiovascular risk factors.    Rx with aspirin is not known to be beneficial in patients fully anticoagulated with warfarin; accordingly, aspirin will be discontinued.

## 2013-04-07 NOTE — Assessment & Plan Note (Addendum)
She has done remarkably well with chronic anticoagulation.  Consideration can be given to substitution of a novel oral anticoagulant, but in consideration of her uncomplicated therapy with warfarin to date, we will continue use of this agent and include careful monitoring of the level of anticoagulation, serial CBCs and stool Hemoccult testing.

## 2013-04-07 NOTE — Assessment & Plan Note (Signed)
Patient is asymptomatic with respect to arrhythmia. Heart rate control is slightly excessive. Diltiazem dose will be decreased to 180 mg per day with next prescription.

## 2013-04-14 ENCOUNTER — Ambulatory Visit (INDEPENDENT_AMBULATORY_CARE_PROVIDER_SITE_OTHER): Payer: Medicare Other | Admitting: *Deleted

## 2013-04-14 DIAGNOSIS — Z7901 Long term (current) use of anticoagulants: Secondary | ICD-10-CM

## 2013-04-22 DIAGNOSIS — Z7901 Long term (current) use of anticoagulants: Secondary | ICD-10-CM | POA: Diagnosis not present

## 2013-04-22 DIAGNOSIS — L089 Local infection of the skin and subcutaneous tissue, unspecified: Secondary | ICD-10-CM | POA: Diagnosis not present

## 2013-04-22 LAB — POC HEMOCCULT BLD/STL (HOME/3-CARD/SCREEN)
Card #2 Fecal Occult Blod, POC: NEGATIVE
Card #3 Fecal Occult Blood, POC: NEGATIVE
Fecal Occult Blood, POC: NEGATIVE

## 2013-04-23 ENCOUNTER — Encounter: Payer: Self-pay | Admitting: *Deleted

## 2013-04-26 DIAGNOSIS — E119 Type 2 diabetes mellitus without complications: Secondary | ICD-10-CM | POA: Diagnosis not present

## 2013-04-26 DIAGNOSIS — I8310 Varicose veins of unspecified lower extremity with inflammation: Secondary | ICD-10-CM | POA: Diagnosis not present

## 2013-05-04 DIAGNOSIS — B351 Tinea unguium: Secondary | ICD-10-CM | POA: Diagnosis not present

## 2013-05-04 DIAGNOSIS — E1149 Type 2 diabetes mellitus with other diabetic neurological complication: Secondary | ICD-10-CM | POA: Diagnosis not present

## 2013-05-10 DIAGNOSIS — E119 Type 2 diabetes mellitus without complications: Secondary | ICD-10-CM | POA: Diagnosis not present

## 2013-05-10 DIAGNOSIS — I8310 Varicose veins of unspecified lower extremity with inflammation: Secondary | ICD-10-CM | POA: Diagnosis not present

## 2013-05-19 ENCOUNTER — Ambulatory Visit: Payer: Medicare Other | Admitting: *Deleted

## 2013-05-19 ENCOUNTER — Ambulatory Visit (INDEPENDENT_AMBULATORY_CARE_PROVIDER_SITE_OTHER): Payer: Medicare Other | Admitting: *Deleted

## 2013-05-19 VITALS — BP 161/75 | HR 70 | Ht 60.0 in | Wt 136.0 lb

## 2013-05-19 DIAGNOSIS — I639 Cerebral infarction, unspecified: Secondary | ICD-10-CM

## 2013-05-19 DIAGNOSIS — Z7901 Long term (current) use of anticoagulants: Secondary | ICD-10-CM

## 2013-05-19 DIAGNOSIS — I4891 Unspecified atrial fibrillation: Secondary | ICD-10-CM | POA: Diagnosis not present

## 2013-05-19 DIAGNOSIS — I6350 Cerebral infarction due to unspecified occlusion or stenosis of unspecified cerebral artery: Secondary | ICD-10-CM

## 2013-05-19 DIAGNOSIS — I10 Essential (primary) hypertension: Secondary | ICD-10-CM

## 2013-05-19 LAB — POCT INR: INR: 3.1

## 2013-05-19 NOTE — Patient Instructions (Signed)
Your physician recommends that you schedule a follow-up appointment in: TO BE DETERMINED   

## 2013-05-19 NOTE — Progress Notes (Signed)
Pt present for blood pressure check. BP is 161/75 with Pulse 70. Pt has no complaints this visit. Pt does state her BP has been running lower at home. Please advise.  [facility], LPN   LAST OV 1-61-09 VS- discharge instructions  BP 179/85  Pulse 64  Ht 5' (1.524 m)  Wt 61.236 kg (135 lb)  BMI 26.37 kg/m2;    1) STOP TAKING ASPIRIN  2) DECREASE DILTIAZEM TO 180MG  WITH YOUR NEXT REFILL, COMPLETE CURRENT DOSAGE FIRST  3) INCREASE LOSARTAN TO 100MG  ONCE DAILY  4) START TAKING HYTRIN 1MG  EVERY NIGHT AT BEDTIME FOR 3 DAYS, THEN INCREASE TO 2MG  (TAKE TWO OF THE ONE MG TABLETS TO EQUAL 2MG ) EVERY NIGHT AT BEDTIME FOR 3 DAYS, THEN INCREASE TO 5 MG ONCE DAILY ONGOING

## 2013-05-24 DIAGNOSIS — E119 Type 2 diabetes mellitus without complications: Secondary | ICD-10-CM | POA: Diagnosis not present

## 2013-05-24 DIAGNOSIS — I8310 Varicose veins of unspecified lower extremity with inflammation: Secondary | ICD-10-CM | POA: Diagnosis not present

## 2013-05-28 DIAGNOSIS — Z23 Encounter for immunization: Secondary | ICD-10-CM | POA: Diagnosis not present

## 2013-06-01 ENCOUNTER — Ambulatory Visit (HOSPITAL_COMMUNITY): Payer: Medicare Other

## 2013-06-01 DIAGNOSIS — B029 Zoster without complications: Secondary | ICD-10-CM | POA: Diagnosis not present

## 2013-06-07 DIAGNOSIS — E119 Type 2 diabetes mellitus without complications: Secondary | ICD-10-CM | POA: Diagnosis not present

## 2013-06-07 DIAGNOSIS — I8310 Varicose veins of unspecified lower extremity with inflammation: Secondary | ICD-10-CM | POA: Diagnosis not present

## 2013-06-09 ENCOUNTER — Encounter (HOSPITAL_COMMUNITY): Payer: Self-pay

## 2013-06-09 ENCOUNTER — Encounter (HOSPITAL_COMMUNITY): Payer: Medicare Other | Attending: Hematology and Oncology

## 2013-06-09 VITALS — BP 150/77 | HR 92 | Temp 97.7°F | Resp 16 | Wt 134.1 lb

## 2013-06-09 DIAGNOSIS — E039 Hypothyroidism, unspecified: Secondary | ICD-10-CM

## 2013-06-09 DIAGNOSIS — Z09 Encounter for follow-up examination after completed treatment for conditions other than malignant neoplasm: Secondary | ICD-10-CM | POA: Insufficient documentation

## 2013-06-09 DIAGNOSIS — Z853 Personal history of malignant neoplasm of breast: Secondary | ICD-10-CM | POA: Insufficient documentation

## 2013-06-09 DIAGNOSIS — C50912 Malignant neoplasm of unspecified site of left female breast: Secondary | ICD-10-CM

## 2013-06-09 DIAGNOSIS — I4891 Unspecified atrial fibrillation: Secondary | ICD-10-CM

## 2013-06-09 DIAGNOSIS — C50919 Malignant neoplasm of unspecified site of unspecified female breast: Secondary | ICD-10-CM | POA: Diagnosis not present

## 2013-06-09 LAB — CBC WITH DIFFERENTIAL/PLATELET
Basophils Absolute: 0 10*3/uL (ref 0.0–0.1)
Basophils Relative: 0 % (ref 0–1)
Hemoglobin: 14 g/dL (ref 12.0–15.0)
MCHC: 32.7 g/dL (ref 30.0–36.0)
Monocytes Relative: 9 % (ref 3–12)
Neutro Abs: 5.9 10*3/uL (ref 1.7–7.7)
Neutrophils Relative %: 67 % (ref 43–77)
RBC: 4.66 MIL/uL (ref 3.87–5.11)
RDW: 14.3 % (ref 11.5–15.5)
WBC: 8.8 10*3/uL (ref 4.0–10.5)

## 2013-06-09 LAB — COMPREHENSIVE METABOLIC PANEL
AST: 15 U/L (ref 0–37)
Albumin: 3.6 g/dL (ref 3.5–5.2)
Alkaline Phosphatase: 55 U/L (ref 39–117)
BUN: 29 mg/dL — ABNORMAL HIGH (ref 6–23)
Chloride: 99 mEq/L (ref 96–112)
Glucose, Bld: 157 mg/dL — ABNORMAL HIGH (ref 70–99)
Potassium: 4.5 mEq/L (ref 3.5–5.1)
Total Bilirubin: 0.3 mg/dL (ref 0.3–1.2)

## 2013-06-09 MED ORDER — ZOLEDRONIC ACID 5 MG/100ML IV SOLN: 5.0000 mg | Freq: Once | INTRAVENOUS | Status: DC

## 2013-06-09 NOTE — Patient Instructions (Addendum)
[facility] Discharge Instructions  RECOMMENDATIONS MADE BY THE CONSULTANT AND ANY TEST RESULTS WILL BE SENT TO YOUR REFERRING PHYSICIAN.  EXAM FINDINGS BY THE PHYSICIAN TODAY AND SIGNS OR SYMPTOMS TO REPORT TO CLINIC OR PRIMARY PHYSICIAN:    Return in 1 year to see doctor and for labs  You will be receiving RECLAST not Zometa once a year.    Thank you for choosing [facility] to provide your oncology and hematology care.  To afford each patient quality time with our providers, please arrive at least 15 minutes before your scheduled appointment time.  With your help, our goal is to use those 15 minutes to complete the necessary work-up to ensure our physicians have the information they need to help with your evaluation and healthcare recommendations.    Effective January 1st, 2014, we ask that you re-schedule your appointment with our physicians should you arrive 10 or more minutes late for your appointment.  We strive to give you quality time with our providers, and arriving late affects you and other patients whose appointments are after yours.    Again, thank you for choosing [facility].  Our hope is that these requests will decrease the amount of time that you wait before being seen by our physicians.       _____________________________________________________________  Should you have questions after your visit to [facility], please contact our office at [phone] between the hours of 8:30 a.m. and 5:00 p.m.  Voicemails left after 4:30 p.m. will not be returned until the following business day.  For prescription refill requests, have your pharmacy contact our office with your prescription refill request.    Zoledronic Acid injection (Paget's Disease, Osteoporosis) What is this medicine? ZOLEDRONIC ACID (ZOE le dron ik AS id) lowers the amount of calcium loss from bone. It is used to treat Paget's disease and osteoporosis  in women. This medicine may be used for other purposes; ask your health care provider or pharmacist if you have questions. What should I tell my health care provider before I take this medicine? They need to know if you have any of these conditions: -aspirin-sensitive asthma -dental disease -kidney disease -low levels of calcium in the blood -past surgery on the parathyroid gland or intestines -an unusual or allergic reaction to zoledronic acid, other medicines, foods, dyes, or preservatives -pregnant or trying to get pregnant -breast-feeding How should I use this medicine? This medicine is for infusion into a vein. It is given by a health care professional in a hospital or clinic setting. Talk to your pediatrician regarding the use of this medicine in children. This medicine is not approved for use in children. Overdosage: If you think you have taken too much of this medicine contact a poison control center or emergency room at once. NOTE: This medicine is only for you. Do not share this medicine with others. What if I miss a dose? It is important not to miss your dose. Call your doctor or health care professional if you are unable to keep an appointment. What may interact with this medicine? -certain antibiotics given by injection -NSAIDs, medicines for pain and inflammation, like ibuprofen or naproxen -some diuretics like bumetanide, furosemide -teriparatide This list may not describe all possible interactions. Give your health care provider a list of all the medicines, herbs, non-prescription drugs, or dietary supplements you use. Also tell them if you smoke, drink alcohol, or use illegal drugs. Some items  may interact with your medicine. What should I watch for while using this medicine? Visit your doctor or health care professional for regular checkups. It may be some time before you see the benefit from this medicine. Do not stop taking your medicine unless your doctor tells you to.  Your doctor may order blood tests or other tests to see how you are doing. Women should inform their doctor if they wish to become pregnant or think they might be pregnant. There is a potential for serious side effects to an unborn child. Talk to your health care professional or pharmacist for more information. You should make sure that you get enough calcium and vitamin D while you are taking this medicine. Discuss the foods you eat and the vitamins you take with your health care professional. Some people who take this medicine have severe bone, joint, and/or muscle pain. This medicine may also increase your risk for a broken thigh bone. Tell your doctor right away if you have pain in your upper leg or groin. Tell your doctor if you have any pain that does not go away or that gets worse. What side effects may I notice from receiving this medicine? Side effects that you should report to your doctor or health care professional as soon as possible: -allergic reactions like skin rash, itching or hives, swelling of the face, lips, or tongue -breathing problems -changes in vision -feeling faint or lightheaded, falls -jaw burning, cramping, or pain -muscle cramps, stiffness, or weakness -trouble passing urine or change in the amount of urine Side effects that usually do not require medical attention (report to your doctor or health care professional if they continue or are bothersome): -bone, joint, or muscle pain -fever -irritation at site where injected -loss of appetite -nausea, vomiting -stomach upset -tired This list may not describe all possible side effects. Call your doctor for medical advice about side effects. You may report side effects to FDA at 1-800-FDA-1088. Where should I keep my medicine? This drug is given in a hospital or clinic and will not be stored at home. NOTE: This sheet is a summary. It may not cover all possible information. If you have questions about this medicine, talk  to your doctor, pharmacist, or health care provider.  2013, [employer]. (01/25/2011 9:08:15 AM)

## 2013-06-09 NOTE — Addendum Note (Signed)
Addended by: Valrie Hart A on: 06/09/2013 03:30 PM   Modules accepted: Orders

## 2013-06-09 NOTE — Progress Notes (Signed)
[facility]  OFFICE PROGRESS NOTE  Mellen, [facility] Edwards, MD  [address] [place] [address]  DIAGNOSIS: Carcinoma of breast, left - Plan: CBC with Differential, Comprehensive metabolic panel, CEA, CA 125  Atrial fibrillation  Hypothyroid - Plan: TSH  Chief Complaint  Patient presents with  . Breast Cancer    CURRENT THERAPY: Watchful expectation.  INTERVAL HISTORY: [name] 79 y.o. female returns for followup of stage I left breast cancer, status post left modified radical mastectomy in July 1995 for a 1.4 cm tumor that was ER negative, PR positive treated postoperatively with 5 years of tamoxifen. About 3 weeks ago she developed a rash below her right breast which was diagnosed as shingles and for which she is taking famciclovir plus prednisone. No new vesicles are noted. She did not have any encrustations. Pain is controlled with minimal analgesic use. Self breast  examination is unremarkable. She denies any nausea, vomiting, diarrhea, constipation, melena, hematochezia, hematuria, vaginal bleeding or discharge, incontinence, PND, orthopnea, palpitations, headache, or seizures.    MEDICAL HISTORY: Past Medical History  Diagnosis Date  . Hypertension   . Carcinoma of breast     left masectomy in 1995  . Atrial fibrillation 2007    CHF with preserved LV systolic function - 2008; moderate LVH  . Arteriosclerotic cardiovascular disease (ASCVD)     cath in 2001- 40% LAD and 50% RCA; stent for 80% LAD in 5/04; residual 50% LAD, 80% small D1, 70% small OM1 50% ostial RCA, nl EF   . MVP (mitral valve prolapse)     moderate; with moderate MR  . PVD (peripheral vascular disease)     ABIs of 0.64 and 0.59, right and left leg in 2009  . CVD (cerebrovascular disease)     plaque w/o focal disease in 2006; h/o CVA  . Diabetes mellitus type II     no insulin   . Hypothyroid   . Osteoporosis   . Edema   . Herpes zoster   . Chronic  hoarseness   . Bronchitis     history  . Varicose veins of legs 08/20/2011  . Lower extremity edema 08/20/2011  . Chronic diastolic heart failure 11/21/2009  . Right knee DJD 08/21/2011    INTERIM HISTORY: has DIABETES MELLITUS, TYPE II; Hyperlipidemia; Hypertension; SPINAL STENOSIS, LUMBAR; Chronic anticoagulation; Arteriosclerotic cardiovascular disease (ASCVD); Carcinoma of breast; Atrial fibrillation; MVP (mitral valve prolapse); PVD (peripheral vascular disease); CVD (cerebrovascular disease); Hypothyroid; Osteoporosis; and Lower extremity edema on her problem list.   stage I adenocarcinoma the left breast (T1 C., N0) status post left modified radical mastectomy in July 1995 for 1.4 cm cancer. Lymph nodes are negative. ER negative but your PR 30% positive. She was treated 5 years of tamoxifen after her surgery.  ALLERGIES:  has No Known Allergies.  MEDICATIONS: has a current medication list which includes the following prescription(s): b complex-vitamin c-folic acid, beta carotene w/minerals, calcium-vitamin d, digoxin, diltiazem, evista, fexofenadine, furosemide, glipizide, levothyroxine, losartan, metformin, nebivolol, omeprazole, potassium chloride sa, raloxifene, rosuvastatin, terazosin, terazosin, and warfarin.  SURGICAL HISTORY:  Past Surgical History  Procedure Laterality Date  . Orif of left hip  05/22/06    Romeo Apple  . Left masectomy  1995  . Cholecystectomy  2006  . Abdominal hysterectomy      abd?  . Cataract extraction, bilateral    . Colonoscopy  Date unknown    FAMILY HISTORY: family history is  not on file.  SOCIAL HISTORY:  reports that she has never smoked. She has never used smokeless tobacco. She reports that she does not drink alcohol or use illicit drugs.  REVIEW OF SYSTEMS:  Other than that discussed above is noncontributory.  PHYSICAL EXAMINATION: ECOG PERFORMANCE STATUS: 2 - Symptomatic, <50% confined to bed  There were no vitals taken for this  visit.  GENERAL:alert, no distress and comfortable. Uses a walker. SKIN: skin color, texture, turgor are normal, no rashes or significant lesions EYES: PERLA; Conjunctiva are pink and non-injected, sclera clear OROPHARYNX:no exudate, no erythema on lips, buccal mucosa, or tongue. NECK: supple, thyroid normal size, non-tender, without nodularity. No masses CHEST: Right breast without mass. Erythematous rash below the right breast in the distribution of T4 left breast without mass. LYMPH:  no palpable lymphadenopathy in the cervical, axillary or inguinal LUNGS: clear to auscultation and percussion with normal breathing effort HEART: regular rate & rhythm and no murmurs and no lower extremity edema ABDOMEN:abdomen soft, non-tender and normal bowel sounds MUSCULOSKELETAL:no cyanosis of digits and no clubbing. Range of motion normal.  NEURO: alert & oriented x 3 with fluent speech, no focal motor/sensory deficits   LABORATORY DATA: Anti-coag visit on 05/19/2013  Component Date Value Range Status  . INR 05/19/2013 3.1   Final    PATHOLOGY:  Urinalysis    Component Value Date/Time   COLORURINE YELLOW 10/18/2010 1959   APPEARANCEUR CLEAR 10/18/2010 1959   LABSPEC <1.005* 10/18/2010 1959   PHURINE 5.5 10/18/2010 1959   GLUCOSEU NEGATIVE 10/18/2010 1959   HGBUR TRACE* 10/18/2010 1959   BILIRUBINUR NEGATIVE 10/18/2010 1959   KETONESUR NEGATIVE 10/18/2010 1959   PROTEINUR TRACE* 10/18/2010 1959   UROBILINOGEN 0.2 10/18/2010 1959   NITRITE NEGATIVE 10/18/2010 1959   LEUKOCYTESUR NEGATIVE 10/18/2010 1959    RADIOGRAPHIC STUDIES: No results found.  ASSESSMENT:  #1.Stage I adenocarcinoma the left breast (T1 C., N0) status post left modified radical mastectomy in July 1995 for 1.4 cm cancer. Lymph nodes are negative. ER negative but your PR 30% positive. She was treated 5 years of tamoxifen after her surgery. #2. Herpes zoster involving the right T4 dermatome, improving. #3. Osteoporosis, to receive week last  in January 2015. #4. Diabetes mellitus, type II, non-insulin requiring, controlled. #5. Left hip fracture in October 2007, status post surgery with good result.  PLAN:  #1. Return in January 2015 to receive week last. #2. His testicles begin to reappear, recommend calling PCP for additional Famvir. #3. Followup in one year. Patient no longer undergoes mammography.   All questions were answered. The patient knows to call the clinic with any problems, questions or concerns. We can certainly see the patient much sooner if necessary.   I spent 25 minutes counseling the patient face to face. The total time spent in the appointment was 30 minutes.    Maurilio Lovely, MD 06/09/2013 2:00 PM

## 2013-06-10 LAB — CEA: CEA: <0.5 ng/mL (ref 0.0–5.0)

## 2013-06-15 NOTE — Progress Notes (Signed)
Correction:  Under #2 in the plan should read as follows: If vesicles begin to reappear, recommend calling PCP for additional Famvir.

## 2013-06-16 ENCOUNTER — Telehealth: Payer: Self-pay | Admitting: *Deleted

## 2013-06-16 NOTE — Telephone Encounter (Signed)
Have pt return in 3 weeks for repeat BP check with nurse.

## 2013-06-16 NOTE — Telephone Encounter (Signed)
Message copied by Thompson Grayer on Wed Jun 16, 2013  9:58 AM ------      Message from: Prentice Docker A      Created: Wed Jun 16, 2013  8:42 AM       Please have her record her BP's in a log (check them three times per week, different days and times of day) and have her return them in a month.      Thanks.            ----- Message -----         From: Thompson Grayer, LPN         Sent: 06/14/2013   8:47 AM           To: Laqueta Linden, MD                   ------

## 2013-06-16 NOTE — Telephone Encounter (Signed)
FYI: Pt has coumadin check on the 19th, will have her come in that day for BP check. Thanks

## 2013-06-16 NOTE — Telephone Encounter (Signed)
Spoke to pt she is unable to check BP at home by herself. Please advise

## 2013-06-21 DIAGNOSIS — E119 Type 2 diabetes mellitus without complications: Secondary | ICD-10-CM | POA: Diagnosis not present

## 2013-06-21 DIAGNOSIS — I8310 Varicose veins of unspecified lower extremity with inflammation: Secondary | ICD-10-CM | POA: Diagnosis not present

## 2013-06-30 ENCOUNTER — Ambulatory Visit (INDEPENDENT_AMBULATORY_CARE_PROVIDER_SITE_OTHER): Payer: Medicare Other | Admitting: *Deleted

## 2013-06-30 VITALS — BP 156/78 | HR 77 | Ht 60.0 in | Wt 138.0 lb

## 2013-06-30 DIAGNOSIS — I6350 Cerebral infarction due to unspecified occlusion or stenosis of unspecified cerebral artery: Secondary | ICD-10-CM | POA: Diagnosis not present

## 2013-06-30 DIAGNOSIS — I4891 Unspecified atrial fibrillation: Secondary | ICD-10-CM

## 2013-06-30 DIAGNOSIS — I639 Cerebral infarction, unspecified: Secondary | ICD-10-CM

## 2013-06-30 DIAGNOSIS — Z7901 Long term (current) use of anticoagulants: Secondary | ICD-10-CM | POA: Diagnosis not present

## 2013-06-30 DIAGNOSIS — I10 Essential (primary) hypertension: Secondary | ICD-10-CM

## 2013-06-30 NOTE — Progress Notes (Signed)
Pt present for BP check. BP is 156/78 with a pulse of 77. Pt states that she feels tired and weak most of the time. She is taking her medication as directed. Please advise.

## 2013-06-30 NOTE — Patient Instructions (Signed)
Your physician recommends that you schedule a follow-up appointment in: TO BE DETERMINED   

## 2013-07-05 DIAGNOSIS — E119 Type 2 diabetes mellitus without complications: Secondary | ICD-10-CM | POA: Diagnosis not present

## 2013-07-05 DIAGNOSIS — I8310 Varicose veins of unspecified lower extremity with inflammation: Secondary | ICD-10-CM | POA: Diagnosis not present

## 2013-07-06 DIAGNOSIS — H35379 Puckering of macula, unspecified eye: Secondary | ICD-10-CM | POA: Diagnosis not present

## 2013-07-06 DIAGNOSIS — E1139 Type 2 diabetes mellitus with other diabetic ophthalmic complication: Secondary | ICD-10-CM | POA: Diagnosis not present

## 2013-07-06 DIAGNOSIS — H35369 Drusen (degenerative) of macula, unspecified eye: Secondary | ICD-10-CM | POA: Diagnosis not present

## 2013-07-12 ENCOUNTER — Encounter: Payer: Self-pay | Admitting: *Deleted

## 2013-07-12 DIAGNOSIS — E119 Type 2 diabetes mellitus without complications: Secondary | ICD-10-CM | POA: Diagnosis not present

## 2013-07-12 DIAGNOSIS — I509 Heart failure, unspecified: Secondary | ICD-10-CM | POA: Diagnosis not present

## 2013-07-12 DIAGNOSIS — I4891 Unspecified atrial fibrillation: Secondary | ICD-10-CM | POA: Diagnosis not present

## 2013-07-12 DIAGNOSIS — E039 Hypothyroidism, unspecified: Secondary | ICD-10-CM | POA: Diagnosis not present

## 2013-07-12 DIAGNOSIS — L97809 Non-pressure chronic ulcer of other part of unspecified lower leg with unspecified severity: Secondary | ICD-10-CM | POA: Diagnosis not present

## 2013-07-12 DIAGNOSIS — I10 Essential (primary) hypertension: Secondary | ICD-10-CM | POA: Diagnosis not present

## 2013-07-26 DIAGNOSIS — I8310 Varicose veins of unspecified lower extremity with inflammation: Secondary | ICD-10-CM | POA: Diagnosis not present

## 2013-07-26 DIAGNOSIS — E119 Type 2 diabetes mellitus without complications: Secondary | ICD-10-CM | POA: Diagnosis not present

## 2013-07-27 ENCOUNTER — Encounter: Payer: Self-pay | Admitting: Cardiovascular Disease

## 2013-08-11 ENCOUNTER — Ambulatory Visit (INDEPENDENT_AMBULATORY_CARE_PROVIDER_SITE_OTHER): Payer: Medicare Other | Admitting: *Deleted

## 2013-08-11 DIAGNOSIS — I4891 Unspecified atrial fibrillation: Secondary | ICD-10-CM

## 2013-08-11 DIAGNOSIS — Z7901 Long term (current) use of anticoagulants: Secondary | ICD-10-CM

## 2013-08-11 DIAGNOSIS — I6350 Cerebral infarction due to unspecified occlusion or stenosis of unspecified cerebral artery: Secondary | ICD-10-CM

## 2013-08-11 DIAGNOSIS — I639 Cerebral infarction, unspecified: Secondary | ICD-10-CM

## 2013-08-11 LAB — POCT INR: INR: 3.5

## 2013-08-16 DIAGNOSIS — I8310 Varicose veins of unspecified lower extremity with inflammation: Secondary | ICD-10-CM | POA: Diagnosis not present

## 2013-08-16 DIAGNOSIS — E119 Type 2 diabetes mellitus without complications: Secondary | ICD-10-CM | POA: Diagnosis not present

## 2013-08-26 ENCOUNTER — Other Ambulatory Visit (HOSPITAL_COMMUNITY): Payer: Self-pay

## 2013-08-26 DIAGNOSIS — L918 Other hypertrophic disorders of the skin: Secondary | ICD-10-CM | POA: Diagnosis not present

## 2013-08-26 DIAGNOSIS — C50919 Malignant neoplasm of unspecified site of unspecified female breast: Secondary | ICD-10-CM

## 2013-08-26 DIAGNOSIS — M810 Age-related osteoporosis without current pathological fracture: Secondary | ICD-10-CM

## 2013-09-01 ENCOUNTER — Ambulatory Visit (INDEPENDENT_AMBULATORY_CARE_PROVIDER_SITE_OTHER): Payer: Medicare Other | Admitting: *Deleted

## 2013-09-01 ENCOUNTER — Encounter (HOSPITAL_BASED_OUTPATIENT_CLINIC_OR_DEPARTMENT_OTHER): Payer: Medicare Other

## 2013-09-01 ENCOUNTER — Encounter (HOSPITAL_COMMUNITY): Payer: Medicare Other | Attending: Hematology and Oncology

## 2013-09-01 VITALS — BP 145/49 | HR 52 | Temp 97.6°F | Resp 18

## 2013-09-01 DIAGNOSIS — I6350 Cerebral infarction due to unspecified occlusion or stenosis of unspecified cerebral artery: Secondary | ICD-10-CM

## 2013-09-01 DIAGNOSIS — I4891 Unspecified atrial fibrillation: Secondary | ICD-10-CM

## 2013-09-01 DIAGNOSIS — Z7901 Long term (current) use of anticoagulants: Secondary | ICD-10-CM

## 2013-09-01 DIAGNOSIS — M810 Age-related osteoporosis without current pathological fracture: Secondary | ICD-10-CM | POA: Diagnosis not present

## 2013-09-01 DIAGNOSIS — I639 Cerebral infarction, unspecified: Secondary | ICD-10-CM

## 2013-09-01 DIAGNOSIS — C50919 Malignant neoplasm of unspecified site of unspecified female breast: Secondary | ICD-10-CM

## 2013-09-01 LAB — COMPREHENSIVE METABOLIC PANEL
ALK PHOS: 62 U/L (ref 39–117)
ALT: 13 U/L (ref 0–35)
AST: 14 U/L (ref 0–37)
Albumin: 3.5 g/dL (ref 3.5–5.2)
BILIRUBIN TOTAL: 0.4 mg/dL (ref 0.3–1.2)
BUN: 29 mg/dL — ABNORMAL HIGH (ref 6–23)
CHLORIDE: 99 meq/L (ref 96–112)
CO2: 29 mEq/L (ref 19–32)
CREATININE: 0.8 mg/dL (ref 0.50–1.10)
Calcium: 9.3 mg/dL (ref 8.4–10.5)
GFR calc Af Amer: 73 mL/min — ABNORMAL LOW (ref >90)
GFR calc non Af Amer: 63 mL/min — ABNORMAL LOW (ref >90)
GLUCOSE: 234 mg/dL — AB (ref 70–99)
POTASSIUM: 4.9 meq/L (ref 3.7–5.3)
Sodium: 138 mEq/L (ref 137–147)
Total Protein: 6.8 g/dL (ref 6.0–8.3)

## 2013-09-01 LAB — POCT INR: INR: 3.3

## 2013-09-01 MED ORDER — SODIUM CHLORIDE 0.9 % IV SOLN
Freq: Once | INTRAVENOUS | Status: AC
Start: 2013-09-01 — End: 2013-09-01
  Administered 2013-09-01: 13:00:00 via INTRAVENOUS

## 2013-09-01 MED ORDER — ZOLEDRONIC ACID 5 MG/100ML IV SOLN
5.0000 mg | Freq: Once | INTRAVENOUS | Status: AC
  Administered 2013-09-01: 5 mg via INTRAVENOUS
  Filled 2013-09-01: qty 100

## 2013-09-01 NOTE — Progress Notes (Signed)
Labs drawn today for cmp 

## 2013-09-01 NOTE — Progress Notes (Signed)
Tolerated well

## 2013-09-17 DIAGNOSIS — B351 Tinea unguium: Secondary | ICD-10-CM | POA: Diagnosis not present

## 2013-09-17 DIAGNOSIS — E1149 Type 2 diabetes mellitus with other diabetic neurological complication: Secondary | ICD-10-CM | POA: Diagnosis not present

## 2013-09-22 DIAGNOSIS — H353290 Exudative age-related macular degeneration, unspecified eye, stage unspecified: Secondary | ICD-10-CM | POA: Diagnosis not present

## 2013-09-22 DIAGNOSIS — E11339 Type 2 diabetes mellitus with moderate nonproliferative diabetic retinopathy without macular edema: Secondary | ICD-10-CM | POA: Diagnosis not present

## 2013-09-22 DIAGNOSIS — H35379 Puckering of macula, unspecified eye: Secondary | ICD-10-CM | POA: Diagnosis not present

## 2013-09-22 DIAGNOSIS — E11329 Type 2 diabetes mellitus with mild nonproliferative diabetic retinopathy without macular edema: Secondary | ICD-10-CM | POA: Diagnosis not present

## 2013-09-22 DIAGNOSIS — H35359 Cystoid macular degeneration, unspecified eye: Secondary | ICD-10-CM | POA: Diagnosis not present

## 2013-10-05 ENCOUNTER — Other Ambulatory Visit: Payer: Self-pay | Admitting: Cardiology

## 2013-10-06 ENCOUNTER — Ambulatory Visit (INDEPENDENT_AMBULATORY_CARE_PROVIDER_SITE_OTHER): Payer: Medicare Other | Admitting: *Deleted

## 2013-10-06 DIAGNOSIS — I4891 Unspecified atrial fibrillation: Secondary | ICD-10-CM

## 2013-10-06 DIAGNOSIS — Z5181 Encounter for therapeutic drug level monitoring: Secondary | ICD-10-CM | POA: Diagnosis not present

## 2013-10-06 DIAGNOSIS — I6350 Cerebral infarction due to unspecified occlusion or stenosis of unspecified cerebral artery: Secondary | ICD-10-CM

## 2013-10-06 DIAGNOSIS — Z7901 Long term (current) use of anticoagulants: Secondary | ICD-10-CM

## 2013-10-06 DIAGNOSIS — I639 Cerebral infarction, unspecified: Secondary | ICD-10-CM

## 2013-10-06 LAB — POCT INR: INR: 5.4

## 2013-10-18 ENCOUNTER — Ambulatory Visit (INDEPENDENT_AMBULATORY_CARE_PROVIDER_SITE_OTHER): Payer: Medicare Other | Admitting: *Deleted

## 2013-10-18 DIAGNOSIS — I4891 Unspecified atrial fibrillation: Secondary | ICD-10-CM

## 2013-10-18 DIAGNOSIS — I639 Cerebral infarction, unspecified: Secondary | ICD-10-CM

## 2013-10-18 DIAGNOSIS — I6350 Cerebral infarction due to unspecified occlusion or stenosis of unspecified cerebral artery: Secondary | ICD-10-CM | POA: Diagnosis not present

## 2013-10-18 DIAGNOSIS — Z7901 Long term (current) use of anticoagulants: Secondary | ICD-10-CM | POA: Diagnosis not present

## 2013-10-18 DIAGNOSIS — Z5181 Encounter for therapeutic drug level monitoring: Secondary | ICD-10-CM

## 2013-10-18 LAB — POCT INR: INR: 3.1

## 2013-10-26 DIAGNOSIS — I999 Unspecified disorder of circulatory system: Secondary | ICD-10-CM | POA: Diagnosis not present

## 2013-10-26 DIAGNOSIS — IMO0001 Reserved for inherently not codable concepts without codable children: Secondary | ICD-10-CM | POA: Diagnosis not present

## 2013-10-26 DIAGNOSIS — I743 Embolism and thrombosis of arteries of the lower extremities: Secondary | ICD-10-CM | POA: Diagnosis not present

## 2013-10-26 DIAGNOSIS — I10 Essential (primary) hypertension: Secondary | ICD-10-CM | POA: Diagnosis not present

## 2013-10-26 DIAGNOSIS — E119 Type 2 diabetes mellitus without complications: Secondary | ICD-10-CM | POA: Diagnosis not present

## 2013-10-26 DIAGNOSIS — I83229 Varicose veins of left lower extremity with both ulcer of unspecified site and inflammation: Secondary | ICD-10-CM | POA: Diagnosis not present

## 2013-10-26 DIAGNOSIS — Z853 Personal history of malignant neoplasm of breast: Secondary | ICD-10-CM | POA: Diagnosis not present

## 2013-10-26 DIAGNOSIS — E785 Hyperlipidemia, unspecified: Secondary | ICD-10-CM | POA: Diagnosis not present

## 2013-10-26 DIAGNOSIS — L97929 Non-pressure chronic ulcer of unspecified part of left lower leg with unspecified severity: Secondary | ICD-10-CM | POA: Diagnosis not present

## 2013-10-26 DIAGNOSIS — L97909 Non-pressure chronic ulcer of unspecified part of unspecified lower leg with unspecified severity: Secondary | ICD-10-CM | POA: Diagnosis not present

## 2013-10-26 DIAGNOSIS — I739 Peripheral vascular disease, unspecified: Secondary | ICD-10-CM | POA: Diagnosis not present

## 2013-10-26 DIAGNOSIS — I779 Disorder of arteries and arterioles, unspecified: Secondary | ICD-10-CM | POA: Diagnosis not present

## 2013-10-26 DIAGNOSIS — I872 Venous insufficiency (chronic) (peripheral): Secondary | ICD-10-CM | POA: Diagnosis not present

## 2013-10-26 DIAGNOSIS — L97809 Non-pressure chronic ulcer of other part of unspecified lower leg with unspecified severity: Secondary | ICD-10-CM | POA: Diagnosis not present

## 2013-10-26 DIAGNOSIS — Z7901 Long term (current) use of anticoagulants: Secondary | ICD-10-CM | POA: Diagnosis not present

## 2013-10-26 DIAGNOSIS — Z79899 Other long term (current) drug therapy: Secondary | ICD-10-CM | POA: Diagnosis not present

## 2013-10-26 DIAGNOSIS — I83219 Varicose veins of right lower extremity with both ulcer of unspecified site and inflammation: Secondary | ICD-10-CM | POA: Diagnosis not present

## 2013-10-28 DIAGNOSIS — I83219 Varicose veins of right lower extremity with both ulcer of unspecified site and inflammation: Secondary | ICD-10-CM | POA: Diagnosis not present

## 2013-10-28 DIAGNOSIS — Z79899 Other long term (current) drug therapy: Secondary | ICD-10-CM | POA: Diagnosis not present

## 2013-10-28 DIAGNOSIS — I739 Peripheral vascular disease, unspecified: Secondary | ICD-10-CM | POA: Diagnosis not present

## 2013-10-28 DIAGNOSIS — E119 Type 2 diabetes mellitus without complications: Secondary | ICD-10-CM | POA: Diagnosis not present

## 2013-10-28 DIAGNOSIS — IMO0001 Reserved for inherently not codable concepts without codable children: Secondary | ICD-10-CM | POA: Diagnosis not present

## 2013-10-28 DIAGNOSIS — L98499 Non-pressure chronic ulcer of skin of other sites with unspecified severity: Secondary | ICD-10-CM | POA: Diagnosis not present

## 2013-10-28 DIAGNOSIS — I872 Venous insufficiency (chronic) (peripheral): Secondary | ICD-10-CM | POA: Diagnosis not present

## 2013-10-28 DIAGNOSIS — L97919 Non-pressure chronic ulcer of unspecified part of right lower leg with unspecified severity: Secondary | ICD-10-CM | POA: Diagnosis not present

## 2013-10-28 DIAGNOSIS — Z853 Personal history of malignant neoplasm of breast: Secondary | ICD-10-CM | POA: Diagnosis not present

## 2013-10-28 DIAGNOSIS — L97809 Non-pressure chronic ulcer of other part of unspecified lower leg with unspecified severity: Secondary | ICD-10-CM | POA: Diagnosis not present

## 2013-11-01 ENCOUNTER — Ambulatory Visit (INDEPENDENT_AMBULATORY_CARE_PROVIDER_SITE_OTHER): Payer: Medicare Other | Admitting: *Deleted

## 2013-11-01 DIAGNOSIS — Z5181 Encounter for therapeutic drug level monitoring: Secondary | ICD-10-CM | POA: Diagnosis not present

## 2013-11-01 DIAGNOSIS — I4891 Unspecified atrial fibrillation: Secondary | ICD-10-CM

## 2013-11-01 DIAGNOSIS — I639 Cerebral infarction, unspecified: Secondary | ICD-10-CM

## 2013-11-01 DIAGNOSIS — Z7901 Long term (current) use of anticoagulants: Secondary | ICD-10-CM | POA: Diagnosis not present

## 2013-11-01 DIAGNOSIS — I6350 Cerebral infarction due to unspecified occlusion or stenosis of unspecified cerebral artery: Secondary | ICD-10-CM | POA: Diagnosis not present

## 2013-11-01 LAB — POCT INR: INR: 2.5

## 2013-11-02 DIAGNOSIS — IMO0001 Reserved for inherently not codable concepts without codable children: Secondary | ICD-10-CM | POA: Diagnosis not present

## 2013-11-02 DIAGNOSIS — L97929 Non-pressure chronic ulcer of unspecified part of left lower leg with unspecified severity: Secondary | ICD-10-CM | POA: Diagnosis not present

## 2013-11-02 DIAGNOSIS — E119 Type 2 diabetes mellitus without complications: Secondary | ICD-10-CM | POA: Diagnosis not present

## 2013-11-02 DIAGNOSIS — I83219 Varicose veins of right lower extremity with both ulcer of unspecified site and inflammation: Secondary | ICD-10-CM | POA: Diagnosis not present

## 2013-11-02 DIAGNOSIS — I739 Peripheral vascular disease, unspecified: Secondary | ICD-10-CM | POA: Diagnosis not present

## 2013-11-02 DIAGNOSIS — Z853 Personal history of malignant neoplasm of breast: Secondary | ICD-10-CM | POA: Diagnosis not present

## 2013-11-02 DIAGNOSIS — Z79899 Other long term (current) drug therapy: Secondary | ICD-10-CM | POA: Diagnosis not present

## 2013-11-02 DIAGNOSIS — L97809 Non-pressure chronic ulcer of other part of unspecified lower leg with unspecified severity: Secondary | ICD-10-CM | POA: Diagnosis not present

## 2013-11-02 DIAGNOSIS — I83229 Varicose veins of left lower extremity with both ulcer of unspecified site and inflammation: Secondary | ICD-10-CM | POA: Diagnosis not present

## 2013-11-02 DIAGNOSIS — I872 Venous insufficiency (chronic) (peripheral): Secondary | ICD-10-CM | POA: Diagnosis not present

## 2013-11-09 DIAGNOSIS — Z79899 Other long term (current) drug therapy: Secondary | ICD-10-CM | POA: Diagnosis not present

## 2013-11-09 DIAGNOSIS — I83229 Varicose veins of left lower extremity with both ulcer of unspecified site and inflammation: Secondary | ICD-10-CM | POA: Diagnosis not present

## 2013-11-09 DIAGNOSIS — L97809 Non-pressure chronic ulcer of other part of unspecified lower leg with unspecified severity: Secondary | ICD-10-CM | POA: Diagnosis not present

## 2013-11-09 DIAGNOSIS — I739 Peripheral vascular disease, unspecified: Secondary | ICD-10-CM | POA: Diagnosis not present

## 2013-11-09 DIAGNOSIS — L98499 Non-pressure chronic ulcer of skin of other sites with unspecified severity: Secondary | ICD-10-CM | POA: Diagnosis not present

## 2013-11-09 DIAGNOSIS — Z853 Personal history of malignant neoplasm of breast: Secondary | ICD-10-CM | POA: Diagnosis not present

## 2013-11-09 DIAGNOSIS — I83219 Varicose veins of right lower extremity with both ulcer of unspecified site and inflammation: Secondary | ICD-10-CM | POA: Diagnosis not present

## 2013-11-09 DIAGNOSIS — E119 Type 2 diabetes mellitus without complications: Secondary | ICD-10-CM | POA: Diagnosis not present

## 2013-11-09 DIAGNOSIS — I872 Venous insufficiency (chronic) (peripheral): Secondary | ICD-10-CM | POA: Diagnosis not present

## 2013-11-09 DIAGNOSIS — IMO0001 Reserved for inherently not codable concepts without codable children: Secondary | ICD-10-CM | POA: Diagnosis not present

## 2013-11-16 DIAGNOSIS — I83229 Varicose veins of left lower extremity with both ulcer of unspecified site and inflammation: Secondary | ICD-10-CM | POA: Diagnosis not present

## 2013-11-16 DIAGNOSIS — I83219 Varicose veins of right lower extremity with both ulcer of unspecified site and inflammation: Secondary | ICD-10-CM | POA: Diagnosis not present

## 2013-11-16 DIAGNOSIS — L97809 Non-pressure chronic ulcer of other part of unspecified lower leg with unspecified severity: Secondary | ICD-10-CM | POA: Diagnosis not present

## 2013-11-16 DIAGNOSIS — I872 Venous insufficiency (chronic) (peripheral): Secondary | ICD-10-CM | POA: Diagnosis not present

## 2013-11-29 ENCOUNTER — Ambulatory Visit (INDEPENDENT_AMBULATORY_CARE_PROVIDER_SITE_OTHER): Payer: Medicare Other | Admitting: *Deleted

## 2013-11-29 DIAGNOSIS — I639 Cerebral infarction, unspecified: Secondary | ICD-10-CM

## 2013-11-29 DIAGNOSIS — I4891 Unspecified atrial fibrillation: Secondary | ICD-10-CM | POA: Diagnosis not present

## 2013-11-29 DIAGNOSIS — Z7901 Long term (current) use of anticoagulants: Secondary | ICD-10-CM

## 2013-11-29 DIAGNOSIS — Z5181 Encounter for therapeutic drug level monitoring: Secondary | ICD-10-CM

## 2013-11-29 DIAGNOSIS — I6350 Cerebral infarction due to unspecified occlusion or stenosis of unspecified cerebral artery: Secondary | ICD-10-CM | POA: Diagnosis not present

## 2013-11-29 LAB — POCT INR: INR: 1.8

## 2013-12-08 DIAGNOSIS — L97929 Non-pressure chronic ulcer of unspecified part of left lower leg with unspecified severity: Secondary | ICD-10-CM | POA: Diagnosis not present

## 2013-12-08 DIAGNOSIS — L97809 Non-pressure chronic ulcer of other part of unspecified lower leg with unspecified severity: Secondary | ICD-10-CM | POA: Diagnosis not present

## 2013-12-08 DIAGNOSIS — I872 Venous insufficiency (chronic) (peripheral): Secondary | ICD-10-CM | POA: Diagnosis not present

## 2013-12-08 DIAGNOSIS — I83219 Varicose veins of right lower extremity with both ulcer of unspecified site and inflammation: Secondary | ICD-10-CM | POA: Diagnosis not present

## 2013-12-14 DIAGNOSIS — L97929 Non-pressure chronic ulcer of unspecified part of left lower leg with unspecified severity: Secondary | ICD-10-CM | POA: Diagnosis not present

## 2013-12-14 DIAGNOSIS — L97809 Non-pressure chronic ulcer of other part of unspecified lower leg with unspecified severity: Secondary | ICD-10-CM | POA: Diagnosis not present

## 2013-12-14 DIAGNOSIS — I872 Venous insufficiency (chronic) (peripheral): Secondary | ICD-10-CM | POA: Diagnosis not present

## 2013-12-14 DIAGNOSIS — I83219 Varicose veins of right lower extremity with both ulcer of unspecified site and inflammation: Secondary | ICD-10-CM | POA: Diagnosis not present

## 2013-12-20 ENCOUNTER — Ambulatory Visit (INDEPENDENT_AMBULATORY_CARE_PROVIDER_SITE_OTHER): Payer: Medicare Other | Admitting: *Deleted

## 2013-12-20 DIAGNOSIS — Z5181 Encounter for therapeutic drug level monitoring: Secondary | ICD-10-CM | POA: Diagnosis not present

## 2013-12-20 DIAGNOSIS — I639 Cerebral infarction, unspecified: Secondary | ICD-10-CM

## 2013-12-20 DIAGNOSIS — I6350 Cerebral infarction due to unspecified occlusion or stenosis of unspecified cerebral artery: Secondary | ICD-10-CM

## 2013-12-20 DIAGNOSIS — I4891 Unspecified atrial fibrillation: Secondary | ICD-10-CM | POA: Diagnosis not present

## 2013-12-20 DIAGNOSIS — Z7901 Long term (current) use of anticoagulants: Secondary | ICD-10-CM

## 2013-12-20 LAB — POCT INR: INR: 1.8

## 2013-12-21 DIAGNOSIS — I83219 Varicose veins of right lower extremity with both ulcer of unspecified site and inflammation: Secondary | ICD-10-CM | POA: Diagnosis not present

## 2013-12-28 DIAGNOSIS — I83219 Varicose veins of right lower extremity with both ulcer of unspecified site and inflammation: Secondary | ICD-10-CM | POA: Diagnosis not present

## 2013-12-28 DIAGNOSIS — L97929 Non-pressure chronic ulcer of unspecified part of left lower leg with unspecified severity: Secondary | ICD-10-CM | POA: Diagnosis not present

## 2014-01-04 ENCOUNTER — Telehealth: Payer: Self-pay | Admitting: *Deleted

## 2014-01-04 DIAGNOSIS — L97929 Non-pressure chronic ulcer of unspecified part of left lower leg with unspecified severity: Secondary | ICD-10-CM | POA: Diagnosis not present

## 2014-01-04 DIAGNOSIS — L97919 Non-pressure chronic ulcer of unspecified part of right lower leg with unspecified severity: Secondary | ICD-10-CM | POA: Diagnosis not present

## 2014-01-04 DIAGNOSIS — I83219 Varicose veins of right lower extremity with both ulcer of unspecified site and inflammation: Secondary | ICD-10-CM | POA: Diagnosis not present

## 2014-01-04 MED ORDER — TERAZOSIN HCL 5 MG PO CAPS: ORAL_CAPSULE | ORAL | Status: DC

## 2014-01-04 NOTE — Addendum Note (Signed)
Addended by: Truett Mainland on: 01/04/2014 02:16 PM   Modules accepted: Orders

## 2014-01-04 NOTE — Telephone Encounter (Signed)
TERAZOSIN 5 MG #90

## 2014-01-04 NOTE — Telephone Encounter (Signed)
Holly Not sure why I got this.  This is Hytrin. Lattie Haw

## 2014-01-04 NOTE — Telephone Encounter (Signed)
Pt has not been seen in a year. Last seen with Dr Lattie Haw, which he stated in his LOV that the Hytrin needed to be titrated down. Gave 15 pills and will not refill after until pt makes an appointment.

## 2014-01-05 DIAGNOSIS — I872 Venous insufficiency (chronic) (peripheral): Secondary | ICD-10-CM | POA: Diagnosis not present

## 2014-01-05 DIAGNOSIS — I2510 Atherosclerotic heart disease of native coronary artery without angina pectoris: Secondary | ICD-10-CM | POA: Diagnosis not present

## 2014-01-05 DIAGNOSIS — E785 Hyperlipidemia, unspecified: Secondary | ICD-10-CM | POA: Diagnosis not present

## 2014-01-05 DIAGNOSIS — M810 Age-related osteoporosis without current pathological fracture: Secondary | ICD-10-CM | POA: Diagnosis not present

## 2014-01-05 DIAGNOSIS — E039 Hypothyroidism, unspecified: Secondary | ICD-10-CM | POA: Diagnosis not present

## 2014-01-05 DIAGNOSIS — I4891 Unspecified atrial fibrillation: Secondary | ICD-10-CM | POA: Diagnosis not present

## 2014-01-05 DIAGNOSIS — E119 Type 2 diabetes mellitus without complications: Secondary | ICD-10-CM | POA: Diagnosis not present

## 2014-01-05 DIAGNOSIS — R609 Edema, unspecified: Secondary | ICD-10-CM | POA: Diagnosis not present

## 2014-01-05 DIAGNOSIS — I10 Essential (primary) hypertension: Secondary | ICD-10-CM | POA: Diagnosis not present

## 2014-01-05 DIAGNOSIS — M48061 Spinal stenosis, lumbar region without neurogenic claudication: Secondary | ICD-10-CM | POA: Diagnosis not present

## 2014-01-05 DIAGNOSIS — L97209 Non-pressure chronic ulcer of unspecified calf with unspecified severity: Secondary | ICD-10-CM | POA: Diagnosis not present

## 2014-01-06 DIAGNOSIS — I872 Venous insufficiency (chronic) (peripheral): Secondary | ICD-10-CM | POA: Diagnosis not present

## 2014-01-06 DIAGNOSIS — I4891 Unspecified atrial fibrillation: Secondary | ICD-10-CM | POA: Diagnosis not present

## 2014-01-06 DIAGNOSIS — L97209 Non-pressure chronic ulcer of unspecified calf with unspecified severity: Secondary | ICD-10-CM | POA: Diagnosis not present

## 2014-01-06 DIAGNOSIS — R609 Edema, unspecified: Secondary | ICD-10-CM | POA: Diagnosis not present

## 2014-01-06 DIAGNOSIS — E119 Type 2 diabetes mellitus without complications: Secondary | ICD-10-CM | POA: Diagnosis not present

## 2014-01-06 DIAGNOSIS — I2510 Atherosclerotic heart disease of native coronary artery without angina pectoris: Secondary | ICD-10-CM | POA: Diagnosis not present

## 2014-01-10 ENCOUNTER — Ambulatory Visit (INDEPENDENT_AMBULATORY_CARE_PROVIDER_SITE_OTHER): Payer: Medicare Other | Admitting: *Deleted

## 2014-01-10 DIAGNOSIS — Z5181 Encounter for therapeutic drug level monitoring: Secondary | ICD-10-CM

## 2014-01-10 DIAGNOSIS — Z7901 Long term (current) use of anticoagulants: Secondary | ICD-10-CM | POA: Diagnosis not present

## 2014-01-10 DIAGNOSIS — I4891 Unspecified atrial fibrillation: Secondary | ICD-10-CM | POA: Diagnosis not present

## 2014-01-10 DIAGNOSIS — I6350 Cerebral infarction due to unspecified occlusion or stenosis of unspecified cerebral artery: Secondary | ICD-10-CM

## 2014-01-10 DIAGNOSIS — I639 Cerebral infarction, unspecified: Secondary | ICD-10-CM

## 2014-01-10 LAB — POCT INR: INR: 1.7

## 2014-01-11 DIAGNOSIS — I2510 Atherosclerotic heart disease of native coronary artery without angina pectoris: Secondary | ICD-10-CM | POA: Diagnosis not present

## 2014-01-11 DIAGNOSIS — R609 Edema, unspecified: Secondary | ICD-10-CM | POA: Diagnosis not present

## 2014-01-11 DIAGNOSIS — E119 Type 2 diabetes mellitus without complications: Secondary | ICD-10-CM | POA: Diagnosis not present

## 2014-01-11 DIAGNOSIS — L97209 Non-pressure chronic ulcer of unspecified calf with unspecified severity: Secondary | ICD-10-CM | POA: Diagnosis not present

## 2014-01-11 DIAGNOSIS — I872 Venous insufficiency (chronic) (peripheral): Secondary | ICD-10-CM | POA: Diagnosis not present

## 2014-01-11 DIAGNOSIS — I4891 Unspecified atrial fibrillation: Secondary | ICD-10-CM | POA: Diagnosis not present

## 2014-01-18 DIAGNOSIS — R609 Edema, unspecified: Secondary | ICD-10-CM | POA: Diagnosis not present

## 2014-01-18 DIAGNOSIS — I2510 Atherosclerotic heart disease of native coronary artery without angina pectoris: Secondary | ICD-10-CM | POA: Diagnosis not present

## 2014-01-18 DIAGNOSIS — E119 Type 2 diabetes mellitus without complications: Secondary | ICD-10-CM | POA: Diagnosis not present

## 2014-01-18 DIAGNOSIS — I4891 Unspecified atrial fibrillation: Secondary | ICD-10-CM | POA: Diagnosis not present

## 2014-01-18 DIAGNOSIS — L97209 Non-pressure chronic ulcer of unspecified calf with unspecified severity: Secondary | ICD-10-CM | POA: Diagnosis not present

## 2014-01-18 DIAGNOSIS — I872 Venous insufficiency (chronic) (peripheral): Secondary | ICD-10-CM | POA: Diagnosis not present

## 2014-01-19 ENCOUNTER — Telehealth: Payer: Self-pay | Admitting: Adult Health

## 2014-01-19 DIAGNOSIS — I2510 Atherosclerotic heart disease of native coronary artery without angina pectoris: Secondary | ICD-10-CM | POA: Diagnosis not present

## 2014-01-19 DIAGNOSIS — I4891 Unspecified atrial fibrillation: Secondary | ICD-10-CM | POA: Diagnosis not present

## 2014-01-19 DIAGNOSIS — L97209 Non-pressure chronic ulcer of unspecified calf with unspecified severity: Secondary | ICD-10-CM | POA: Diagnosis not present

## 2014-01-19 DIAGNOSIS — I872 Venous insufficiency (chronic) (peripheral): Secondary | ICD-10-CM | POA: Diagnosis not present

## 2014-01-19 DIAGNOSIS — R609 Edema, unspecified: Secondary | ICD-10-CM | POA: Diagnosis not present

## 2014-01-19 DIAGNOSIS — E119 Type 2 diabetes mellitus without complications: Secondary | ICD-10-CM | POA: Diagnosis not present

## 2014-01-19 MED ORDER — TERAZOSIN HCL 5 MG PO CAPS: ORAL_CAPSULE | ORAL | Status: DC

## 2014-01-19 NOTE — Telephone Encounter (Signed)
Medication sent via escribe.  

## 2014-01-19 NOTE — Telephone Encounter (Signed)
Received fax refill request  Rx # [id number] Medication:  Terazosin 5 mg capsule  Qty 15 Sig:  Take one capsule by mouth at bedtime Physician:  Purcell Nails

## 2014-01-20 DIAGNOSIS — I4891 Unspecified atrial fibrillation: Secondary | ICD-10-CM | POA: Diagnosis not present

## 2014-01-20 DIAGNOSIS — IMO0001 Reserved for inherently not codable concepts without codable children: Secondary | ICD-10-CM | POA: Diagnosis not present

## 2014-01-20 DIAGNOSIS — E039 Hypothyroidism, unspecified: Secondary | ICD-10-CM | POA: Diagnosis not present

## 2014-01-20 DIAGNOSIS — K921 Melena: Secondary | ICD-10-CM | POA: Diagnosis not present

## 2014-01-20 DIAGNOSIS — E119 Type 2 diabetes mellitus without complications: Secondary | ICD-10-CM | POA: Diagnosis not present

## 2014-01-24 ENCOUNTER — Ambulatory Visit (INDEPENDENT_AMBULATORY_CARE_PROVIDER_SITE_OTHER): Payer: Medicare Other | Admitting: *Deleted

## 2014-01-24 ENCOUNTER — Ambulatory Visit (INDEPENDENT_AMBULATORY_CARE_PROVIDER_SITE_OTHER): Payer: Medicare Other | Admitting: Internal Medicine

## 2014-01-24 VITALS — BP 122/60 | HR 65 | Ht 60.0 in | Wt 133.0 lb

## 2014-01-24 DIAGNOSIS — I6350 Cerebral infarction due to unspecified occlusion or stenosis of unspecified cerebral artery: Secondary | ICD-10-CM

## 2014-01-24 DIAGNOSIS — I639 Cerebral infarction, unspecified: Secondary | ICD-10-CM

## 2014-01-24 DIAGNOSIS — I4891 Unspecified atrial fibrillation: Secondary | ICD-10-CM

## 2014-01-24 DIAGNOSIS — Z7901 Long term (current) use of anticoagulants: Secondary | ICD-10-CM | POA: Diagnosis not present

## 2014-01-24 DIAGNOSIS — Z5181 Encounter for therapeutic drug level monitoring: Secondary | ICD-10-CM

## 2014-01-24 LAB — POCT INR: INR: 1.8

## 2014-01-24 MED ORDER — TERAZOSIN HCL 5 MG PO CAPS: ORAL_CAPSULE | ORAL | Status: DC

## 2014-01-24 NOTE — Patient Instructions (Signed)
Your physician wants you to follow-up in: 1 year with Dr.Ross You will receive a reminder letter in the mail two months in advance. If you don't receive a letter, please call our office to schedule the follow-up appointment.   Your physician recommends that you continue on your current medications as directed. Please refer to the Current Medication list given to you today.    Thank you for choosing [facility] !

## 2014-01-24 NOTE — Progress Notes (Addendum)
Patient ID: [name], female   DOB: [birth year], 80 y.o.   MRN: [id number]  HPI:  [name] is a 80 yo who was previously followed by R Rothbart.  She was last seen in August 2014.  SHe has a history of atrial fibrillation. Since seen she continues to have intermitt weak spells.  Occur mostly in AM  Has to rest.   Gets up 4x per night to go to bathroom then goes right back to sleep  No signif dizziness Notes increased stress in life Deneis CP  Breathing is OK Current Outpatient Prescriptions  Medication Sig Dispense Refill  . ACETAMINOPHEN PO Take 250 mg by mouth as needed.      . beta carotene w/minerals (OCUVITE) tablet Take 1 tablet by mouth daily.        . calcium-vitamin D (OSCAL 500/200 D-3) 500-200 MG-UNIT per tablet Take 1 tablet by mouth daily.        . digoxin (LANOXIN) 0.125 MG tablet Take 125 mcg by mouth daily.        Marland Kitchen diltiazem (CARDIZEM CD) 180 MG 24 hr capsule Take 1 capsule (180 mg total) by mouth daily.  90 capsule  3  . EVISTA 60 MG tablet take 1 tablet by mouth once daily  30 each  4  . fexofenadine (ALLEGRA) 180 MG tablet Take 180 mg by mouth daily.        . furosemide (LASIX) 20 MG tablet Take 20 mg by mouth daily.        Marland Kitchen glipiZIDE (GLUCOTROL) 5 MG tablet Take 5 mg by mouth daily.       Marland Kitchen levothyroxine (SYNTHROID, LEVOTHROID) 25 MCG tablet Take 25 mcg by mouth every morning.       Marland Kitchen losartan (COZAAR) 100 MG tablet Take 1 tablet (100 mg total) by mouth daily.  90 tablet  3  . metFORMIN (GLUCOPHAGE) 500 MG tablet Take 500 mg by mouth 2 (two) times daily with a meal.        . Multiple Vitamins-Minerals (CENTRUM) tablet Take 1 tablet by mouth daily.      . nebivolol (BYSTOLIC) 10 MG tablet Take 10 mg by mouth daily.        Marland Kitchen omeprazole (PRILOSEC) 20 MG capsule Take 20 mg by mouth 2 (two) times daily.        . potassium chloride SA (K-DUR,KLOR-CON) 20 MEQ tablet Take 20 mEq by mouth daily.        . raloxifene (EVISTA) 60 MG tablet Take 60 mg by mouth daily.       .  rosuvastatin (CRESTOR) 10 MG tablet Take 10 mg by mouth at bedtime as needed and may repeat dose one time if needed.        . terazosin (HYTRIN) 5 MG capsule TAKE ONE CAPSULE BY MOUTH AT BEDTIME  30 capsule  6  . triamcinolone cream (KENALOG) 0.1 %       . warfarin (COUMADIN) 2 MG tablet Take 2-4 mg by mouth as directed. Takes 4 mg on Mondays and Fridays and 2 mg the rest of the week. **Takes at bedtime**       No current facility-administered medications for this visit.   No Known Allergies   Past medical history, social history, and family history reviewed and updated.  ROS: Denies palpitations, lightheadedness or syncope. All other systems reviewed and are negative.  PHYSICAL EXAM: BP 122/60  Pulse 65  Ht 5' (1.524 m)  Wt 133 lb (60.328 kg)  BMI 25.97 kg/m2;  Body mass index is 25.97 kg/(m^2). General-Patient is in  no acute distress Body habitus-proportionate weight and height Neck-No JVD; no carotid bruits Lungs-clear lung fields; resonant to percussion; moderate kyphosis Cardiovascular-normal PMI; normal S1 and S2  II/VI systolic murmur LSB Abdomen-normal bowel sounds; soft and non-tender  Musculoskeletal-No deformities, no cyanosis or clubbing Neurologic-Normal cranial nerves; symmetric strength and tone Skin-Warm, no significant lesions Extremities-distal pulses intact; Tr  edema  EKG: Atrial fibrillation  63 bpm       ASSESSMENT AND PLAN  1.  Atrial fibrillation.  Keep on rate control and coumadin  Follw up in coumadin clinic  2.  Weakness.  I am not convinced this is cardiac in origin  Chronic  No apparent progression  WIll follow.

## 2014-01-25 DIAGNOSIS — I2510 Atherosclerotic heart disease of native coronary artery without angina pectoris: Secondary | ICD-10-CM | POA: Diagnosis not present

## 2014-01-25 DIAGNOSIS — I4891 Unspecified atrial fibrillation: Secondary | ICD-10-CM | POA: Diagnosis not present

## 2014-01-25 DIAGNOSIS — R609 Edema, unspecified: Secondary | ICD-10-CM | POA: Diagnosis not present

## 2014-01-25 DIAGNOSIS — E119 Type 2 diabetes mellitus without complications: Secondary | ICD-10-CM | POA: Diagnosis not present

## 2014-01-25 DIAGNOSIS — I872 Venous insufficiency (chronic) (peripheral): Secondary | ICD-10-CM | POA: Diagnosis not present

## 2014-01-25 DIAGNOSIS — L97209 Non-pressure chronic ulcer of unspecified calf with unspecified severity: Secondary | ICD-10-CM | POA: Diagnosis not present

## 2014-01-27 DIAGNOSIS — I2510 Atherosclerotic heart disease of native coronary artery without angina pectoris: Secondary | ICD-10-CM | POA: Diagnosis not present

## 2014-01-27 DIAGNOSIS — R609 Edema, unspecified: Secondary | ICD-10-CM | POA: Diagnosis not present

## 2014-01-27 DIAGNOSIS — L97209 Non-pressure chronic ulcer of unspecified calf with unspecified severity: Secondary | ICD-10-CM | POA: Diagnosis not present

## 2014-01-27 DIAGNOSIS — I4891 Unspecified atrial fibrillation: Secondary | ICD-10-CM | POA: Diagnosis not present

## 2014-01-27 DIAGNOSIS — I872 Venous insufficiency (chronic) (peripheral): Secondary | ICD-10-CM | POA: Diagnosis not present

## 2014-01-27 DIAGNOSIS — E119 Type 2 diabetes mellitus without complications: Secondary | ICD-10-CM | POA: Diagnosis not present

## 2014-01-28 DIAGNOSIS — E1149 Type 2 diabetes mellitus with other diabetic neurological complication: Secondary | ICD-10-CM | POA: Diagnosis not present

## 2014-01-28 DIAGNOSIS — B351 Tinea unguium: Secondary | ICD-10-CM | POA: Diagnosis not present

## 2014-02-01 DIAGNOSIS — I2510 Atherosclerotic heart disease of native coronary artery without angina pectoris: Secondary | ICD-10-CM | POA: Diagnosis not present

## 2014-02-01 DIAGNOSIS — E119 Type 2 diabetes mellitus without complications: Secondary | ICD-10-CM | POA: Diagnosis not present

## 2014-02-01 DIAGNOSIS — I872 Venous insufficiency (chronic) (peripheral): Secondary | ICD-10-CM | POA: Diagnosis not present

## 2014-02-01 DIAGNOSIS — R609 Edema, unspecified: Secondary | ICD-10-CM | POA: Diagnosis not present

## 2014-02-01 DIAGNOSIS — I4891 Unspecified atrial fibrillation: Secondary | ICD-10-CM | POA: Diagnosis not present

## 2014-02-01 DIAGNOSIS — L97209 Non-pressure chronic ulcer of unspecified calf with unspecified severity: Secondary | ICD-10-CM | POA: Diagnosis not present

## 2014-02-02 DIAGNOSIS — I4891 Unspecified atrial fibrillation: Secondary | ICD-10-CM | POA: Diagnosis not present

## 2014-02-02 DIAGNOSIS — I872 Venous insufficiency (chronic) (peripheral): Secondary | ICD-10-CM | POA: Diagnosis not present

## 2014-02-02 DIAGNOSIS — E119 Type 2 diabetes mellitus without complications: Secondary | ICD-10-CM | POA: Diagnosis not present

## 2014-02-02 DIAGNOSIS — L97209 Non-pressure chronic ulcer of unspecified calf with unspecified severity: Secondary | ICD-10-CM | POA: Diagnosis not present

## 2014-02-02 DIAGNOSIS — I2510 Atherosclerotic heart disease of native coronary artery without angina pectoris: Secondary | ICD-10-CM | POA: Diagnosis not present

## 2014-02-02 DIAGNOSIS — R609 Edema, unspecified: Secondary | ICD-10-CM | POA: Diagnosis not present

## 2014-02-07 DIAGNOSIS — I872 Venous insufficiency (chronic) (peripheral): Secondary | ICD-10-CM | POA: Diagnosis not present

## 2014-02-07 DIAGNOSIS — R609 Edema, unspecified: Secondary | ICD-10-CM | POA: Diagnosis not present

## 2014-02-07 DIAGNOSIS — I4891 Unspecified atrial fibrillation: Secondary | ICD-10-CM | POA: Diagnosis not present

## 2014-02-07 DIAGNOSIS — E119 Type 2 diabetes mellitus without complications: Secondary | ICD-10-CM | POA: Diagnosis not present

## 2014-02-07 DIAGNOSIS — I2510 Atherosclerotic heart disease of native coronary artery without angina pectoris: Secondary | ICD-10-CM | POA: Diagnosis not present

## 2014-02-07 DIAGNOSIS — L97209 Non-pressure chronic ulcer of unspecified calf with unspecified severity: Secondary | ICD-10-CM | POA: Diagnosis not present

## 2014-02-08 DIAGNOSIS — I83219 Varicose veins of right lower extremity with both ulcer of unspecified site and inflammation: Secondary | ICD-10-CM | POA: Diagnosis not present

## 2014-02-08 DIAGNOSIS — Z09 Encounter for follow-up examination after completed treatment for conditions other than malignant neoplasm: Secondary | ICD-10-CM | POA: Diagnosis not present

## 2014-02-08 DIAGNOSIS — L97929 Non-pressure chronic ulcer of unspecified part of left lower leg with unspecified severity: Secondary | ICD-10-CM | POA: Diagnosis not present

## 2014-02-08 DIAGNOSIS — I872 Venous insufficiency (chronic) (peripheral): Secondary | ICD-10-CM | POA: Diagnosis not present

## 2014-02-09 DIAGNOSIS — I872 Venous insufficiency (chronic) (peripheral): Secondary | ICD-10-CM | POA: Diagnosis not present

## 2014-02-09 DIAGNOSIS — L97209 Non-pressure chronic ulcer of unspecified calf with unspecified severity: Secondary | ICD-10-CM | POA: Diagnosis not present

## 2014-02-09 DIAGNOSIS — E119 Type 2 diabetes mellitus without complications: Secondary | ICD-10-CM | POA: Diagnosis not present

## 2014-02-09 DIAGNOSIS — R609 Edema, unspecified: Secondary | ICD-10-CM | POA: Diagnosis not present

## 2014-02-09 DIAGNOSIS — I2510 Atherosclerotic heart disease of native coronary artery without angina pectoris: Secondary | ICD-10-CM | POA: Diagnosis not present

## 2014-02-09 DIAGNOSIS — I4891 Unspecified atrial fibrillation: Secondary | ICD-10-CM | POA: Diagnosis not present

## 2014-02-10 ENCOUNTER — Ambulatory Visit (INDEPENDENT_AMBULATORY_CARE_PROVIDER_SITE_OTHER): Payer: Medicare Other | Admitting: *Deleted

## 2014-02-10 DIAGNOSIS — I6350 Cerebral infarction due to unspecified occlusion or stenosis of unspecified cerebral artery: Secondary | ICD-10-CM

## 2014-02-10 DIAGNOSIS — Z7901 Long term (current) use of anticoagulants: Secondary | ICD-10-CM

## 2014-02-10 DIAGNOSIS — I639 Cerebral infarction, unspecified: Secondary | ICD-10-CM

## 2014-02-10 DIAGNOSIS — I4891 Unspecified atrial fibrillation: Secondary | ICD-10-CM | POA: Diagnosis not present

## 2014-02-10 DIAGNOSIS — I482 Chronic atrial fibrillation, unspecified: Secondary | ICD-10-CM

## 2014-02-10 DIAGNOSIS — Z5181 Encounter for therapeutic drug level monitoring: Secondary | ICD-10-CM | POA: Diagnosis not present

## 2014-02-10 LAB — POCT INR: INR: 2.2

## 2014-02-11 DIAGNOSIS — I4891 Unspecified atrial fibrillation: Secondary | ICD-10-CM | POA: Diagnosis not present

## 2014-02-11 DIAGNOSIS — R609 Edema, unspecified: Secondary | ICD-10-CM | POA: Diagnosis not present

## 2014-02-11 DIAGNOSIS — L97209 Non-pressure chronic ulcer of unspecified calf with unspecified severity: Secondary | ICD-10-CM | POA: Diagnosis not present

## 2014-02-11 DIAGNOSIS — E119 Type 2 diabetes mellitus without complications: Secondary | ICD-10-CM | POA: Diagnosis not present

## 2014-02-11 DIAGNOSIS — I872 Venous insufficiency (chronic) (peripheral): Secondary | ICD-10-CM | POA: Diagnosis not present

## 2014-02-11 DIAGNOSIS — I2510 Atherosclerotic heart disease of native coronary artery without angina pectoris: Secondary | ICD-10-CM | POA: Diagnosis not present

## 2014-02-15 DIAGNOSIS — R609 Edema, unspecified: Secondary | ICD-10-CM | POA: Diagnosis not present

## 2014-02-15 DIAGNOSIS — I2510 Atherosclerotic heart disease of native coronary artery without angina pectoris: Secondary | ICD-10-CM | POA: Diagnosis not present

## 2014-02-15 DIAGNOSIS — E119 Type 2 diabetes mellitus without complications: Secondary | ICD-10-CM | POA: Diagnosis not present

## 2014-02-15 DIAGNOSIS — L97209 Non-pressure chronic ulcer of unspecified calf with unspecified severity: Secondary | ICD-10-CM | POA: Diagnosis not present

## 2014-02-15 DIAGNOSIS — I872 Venous insufficiency (chronic) (peripheral): Secondary | ICD-10-CM | POA: Diagnosis not present

## 2014-02-15 DIAGNOSIS — I4891 Unspecified atrial fibrillation: Secondary | ICD-10-CM | POA: Diagnosis not present

## 2014-02-22 DIAGNOSIS — R609 Edema, unspecified: Secondary | ICD-10-CM | POA: Diagnosis not present

## 2014-02-22 DIAGNOSIS — I2510 Atherosclerotic heart disease of native coronary artery without angina pectoris: Secondary | ICD-10-CM | POA: Diagnosis not present

## 2014-02-22 DIAGNOSIS — I4891 Unspecified atrial fibrillation: Secondary | ICD-10-CM | POA: Diagnosis not present

## 2014-02-22 DIAGNOSIS — E119 Type 2 diabetes mellitus without complications: Secondary | ICD-10-CM | POA: Diagnosis not present

## 2014-02-22 DIAGNOSIS — L97209 Non-pressure chronic ulcer of unspecified calf with unspecified severity: Secondary | ICD-10-CM | POA: Diagnosis not present

## 2014-02-22 DIAGNOSIS — I872 Venous insufficiency (chronic) (peripheral): Secondary | ICD-10-CM | POA: Diagnosis not present

## 2014-03-01 DIAGNOSIS — E119 Type 2 diabetes mellitus without complications: Secondary | ICD-10-CM | POA: Diagnosis not present

## 2014-03-01 DIAGNOSIS — I4891 Unspecified atrial fibrillation: Secondary | ICD-10-CM | POA: Diagnosis not present

## 2014-03-01 DIAGNOSIS — R609 Edema, unspecified: Secondary | ICD-10-CM | POA: Diagnosis not present

## 2014-03-01 DIAGNOSIS — I2510 Atherosclerotic heart disease of native coronary artery without angina pectoris: Secondary | ICD-10-CM | POA: Diagnosis not present

## 2014-03-01 DIAGNOSIS — I872 Venous insufficiency (chronic) (peripheral): Secondary | ICD-10-CM | POA: Diagnosis not present

## 2014-03-01 DIAGNOSIS — L97209 Non-pressure chronic ulcer of unspecified calf with unspecified severity: Secondary | ICD-10-CM | POA: Diagnosis not present

## 2014-03-04 ENCOUNTER — Emergency Department (HOSPITAL_COMMUNITY): Payer: Medicare Other

## 2014-03-04 ENCOUNTER — Emergency Department (HOSPITAL_COMMUNITY)
Admission: EM | Admit: 2014-03-04 | Discharge: 2014-03-04 | Disposition: A | Discharge status: Home / Self Care | Payer: Medicare Other | Attending: Emergency Medicine | Admitting: Emergency Medicine

## 2014-03-04 ENCOUNTER — Encounter (HOSPITAL_COMMUNITY): Payer: Self-pay | Admitting: Emergency Medicine

## 2014-03-04 DIAGNOSIS — Z8673 Personal history of transient ischemic attack (TIA), and cerebral infarction without residual deficits: Secondary | ICD-10-CM | POA: Diagnosis not present

## 2014-03-04 DIAGNOSIS — I739 Peripheral vascular disease, unspecified: Secondary | ICD-10-CM | POA: Diagnosis not present

## 2014-03-04 DIAGNOSIS — M810 Age-related osteoporosis without current pathological fracture: Secondary | ICD-10-CM | POA: Insufficient documentation

## 2014-03-04 DIAGNOSIS — M25569 Pain in unspecified knee: Secondary | ICD-10-CM | POA: Diagnosis not present

## 2014-03-04 DIAGNOSIS — S8253XA Displaced fracture of medial malleolus of unspecified tibia, initial encounter for closed fracture: Secondary | ICD-10-CM | POA: Insufficient documentation

## 2014-03-04 DIAGNOSIS — Z79899 Other long term (current) drug therapy: Secondary | ICD-10-CM | POA: Diagnosis not present

## 2014-03-04 DIAGNOSIS — M899 Disorder of bone, unspecified: Secondary | ICD-10-CM | POA: Diagnosis not present

## 2014-03-04 DIAGNOSIS — E039 Hypothyroidism, unspecified: Secondary | ICD-10-CM | POA: Diagnosis not present

## 2014-03-04 DIAGNOSIS — X58XXXA Exposure to other specified factors, initial encounter: Secondary | ICD-10-CM | POA: Insufficient documentation

## 2014-03-04 DIAGNOSIS — I2510 Atherosclerotic heart disease of native coronary artery without angina pectoris: Secondary | ICD-10-CM | POA: Diagnosis not present

## 2014-03-04 DIAGNOSIS — IMO0002 Reserved for concepts with insufficient information to code with codable children: Secondary | ICD-10-CM | POA: Diagnosis not present

## 2014-03-04 DIAGNOSIS — Z8709 Personal history of other diseases of the respiratory system: Secondary | ICD-10-CM | POA: Diagnosis not present

## 2014-03-04 DIAGNOSIS — Y929 Unspecified place or not applicable: Secondary | ICD-10-CM | POA: Diagnosis not present

## 2014-03-04 DIAGNOSIS — E119 Type 2 diabetes mellitus without complications: Secondary | ICD-10-CM | POA: Insufficient documentation

## 2014-03-04 DIAGNOSIS — Z872 Personal history of diseases of the skin and subcutaneous tissue: Secondary | ICD-10-CM | POA: Insufficient documentation

## 2014-03-04 DIAGNOSIS — M259 Joint disorder, unspecified: Secondary | ICD-10-CM | POA: Diagnosis not present

## 2014-03-04 DIAGNOSIS — I10 Essential (primary) hypertension: Secondary | ICD-10-CM | POA: Insufficient documentation

## 2014-03-04 DIAGNOSIS — I059 Rheumatic mitral valve disease, unspecified: Secondary | ICD-10-CM | POA: Diagnosis not present

## 2014-03-04 DIAGNOSIS — I5032 Chronic diastolic (congestive) heart failure: Secondary | ICD-10-CM | POA: Diagnosis not present

## 2014-03-04 DIAGNOSIS — M5137 Other intervertebral disc degeneration, lumbosacral region: Secondary | ICD-10-CM | POA: Diagnosis not present

## 2014-03-04 DIAGNOSIS — Z7901 Long term (current) use of anticoagulants: Secondary | ICD-10-CM | POA: Insufficient documentation

## 2014-03-04 DIAGNOSIS — I4891 Unspecified atrial fibrillation: Secondary | ICD-10-CM | POA: Diagnosis not present

## 2014-03-04 DIAGNOSIS — Z853 Personal history of malignant neoplasm of breast: Secondary | ICD-10-CM | POA: Diagnosis not present

## 2014-03-04 DIAGNOSIS — G608 Other hereditary and idiopathic neuropathies: Secondary | ICD-10-CM | POA: Insufficient documentation

## 2014-03-04 DIAGNOSIS — Y939 Activity, unspecified: Secondary | ICD-10-CM | POA: Diagnosis not present

## 2014-03-04 DIAGNOSIS — M7989 Other specified soft tissue disorders: Secondary | ICD-10-CM | POA: Diagnosis not present

## 2014-03-04 DIAGNOSIS — M79609 Pain in unspecified limb: Secondary | ICD-10-CM | POA: Diagnosis not present

## 2014-03-04 DIAGNOSIS — M79605 Pain in left leg: Secondary | ICD-10-CM

## 2014-03-04 DIAGNOSIS — Z8619 Personal history of other infectious and parasitic diseases: Secondary | ICD-10-CM | POA: Diagnosis not present

## 2014-03-04 DIAGNOSIS — M47817 Spondylosis without myelopathy or radiculopathy, lumbosacral region: Secondary | ICD-10-CM | POA: Diagnosis not present

## 2014-03-04 DIAGNOSIS — I8310 Varicose veins of unspecified lower extremity with inflammation: Secondary | ICD-10-CM | POA: Insufficient documentation

## 2014-03-04 DIAGNOSIS — M1710 Unilateral primary osteoarthritis, unspecified knee: Secondary | ICD-10-CM | POA: Insufficient documentation

## 2014-03-04 DIAGNOSIS — M7730 Calcaneal spur, unspecified foot: Secondary | ICD-10-CM | POA: Diagnosis not present

## 2014-03-04 DIAGNOSIS — S8255XA Nondisplaced fracture of medial malleolus of left tibia, initial encounter for closed fracture: Secondary | ICD-10-CM

## 2014-03-04 HISTORY — DX: Polyneuropathy, unspecified: G62.9

## 2014-03-04 MED ORDER — ACETAMINOPHEN 325 MG PO TABS
650.0000 mg | ORAL_TABLET | Freq: Once | ORAL | Status: AC
  Administered 2014-03-04: 650 mg via ORAL
  Filled 2014-03-04: qty 2

## 2014-03-04 NOTE — ED Provider Notes (Signed)
CSN: [id number]     Arrival date & time 03/04/14  1513 History   First MD Initiated Contact with Patient 03/04/14 1537     Chief Complaint  Patient presents with  . Leg Pain     HPI Pt was seen at 1630. Per pt and her family, c/o gradual onset and persistence of constant LLE "swelling" and "pain" for the past 3 weeks. Symptoms are located from her left knee to foot. States she has chronic pain in her left leg "due to my neuropathy." Pt has been taking one 250mg  tylenol per day without relief. Denies injury, no focal motor weakness, no new tingling/numbness in extremity, no fevers, no rash, no wounds, no abd pain, no back pain, no CP/SOB.    Past Medical History  Diagnosis Date  . Hypertension   . Carcinoma of breast     left masectomy in 1995  . Atrial fibrillation 2007    CHF with preserved LV systolic function - 4097; moderate LVH  . Arteriosclerotic cardiovascular disease (ASCVD)     cath in 2001- 40% LAD and 50% RCA; stent for 80% LAD in 5/04; residual 50% LAD, 80% small D1, 70% small OM1 50% ostial RCA, nl EF   . MVP (mitral valve prolapse)     moderate; with moderate MR  . PVD (peripheral vascular disease)     ABIs of 0.64 and 0.59, right and left leg in 2009  . CVD (cerebrovascular disease)     plaque w/o focal disease in 2006; h/o CVA  . Diabetes mellitus type II     no insulin   . Hypothyroid   . Osteoporosis   . Edema   . Herpes zoster   . Chronic hoarseness   . Bronchitis     history  . Varicose veins of legs 08/20/2011  . Lower extremity edema 08/20/2011  . Chronic diastolic heart failure 3/53/2992  . 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 Shingles   . Ulcer     left lower leg  . Peripheral neuropathy    Past Surgical History  Procedure Laterality Date  . Orif of left hip  05/22/06    [name]  . Left masectomy  1995  . Cholecystectomy  2006  . Abdominal hysterectomy      abd?  . Cataract extraction, bilateral    . Colonoscopy  Date unknown  . Coronary angioplasty  with stent placement     Family History  Problem Relation Age of Onset  . Diabetes Mother    History  Substance Use Topics  . Smoking status: Never Smoker   . Smokeless tobacco: Never Used     Comment: tobacco use - no   . Alcohol Use: No    Review of Systems ROS: Statement: All systems negative except as marked or noted in the HPI; Constitutional: Negative for fever and chills. ; ; Eyes: Negative for eye pain, redness and discharge. ; ; ENMT: Negative for ear pain, hoarseness, nasal congestion, sinus pressure and sore throat. ; ; Cardiovascular: Negative for chest pain, palpitations, diaphoresis, dyspnea and +LLE peripheral edema. ; ; Respiratory: Negative for cough, wheezing and stridor. ; ; Gastrointestinal: Negative for nausea, vomiting, diarrhea, abdominal pain, blood in stool, hematemesis, jaundice and rectal bleeding. . ; ; Genitourinary: Negative for dysuria, flank pain and hematuria. ; ; Musculoskeletal: Negative for back pain and neck pain. Negative for swelling and trauma.; ; Skin: Negative for pruritus, rash, abrasions, blisters, bruising and skin lesion.; ; Neuro: Negative for headache, lightheadedness  and neck stiffness. Negative for weakness, altered level of consciousness , altered mental status, extremity weakness, paresthesias, involuntary movement, seizure and syncope.      Allergies  Review of patient's allergies indicates no known allergies.  Home Medications   Prior to Admission medications   Medication Sig Start Date End Date Taking? Authorizing Provider  Acetaminophen 500 MG coapsule Take 250 mg by mouth daily as needed for fever or pain.   Yes Historical Provider, MD  beta carotene w/minerals (OCUVITE) tablet Take 1 tablet by mouth daily.     Yes Historical Provider, MD  Calcium Carbonate-Vitamin D (CALCARB 600/D PO) Take 1 tablet by mouth at bedtime.   Yes Historical Provider, MD  digoxin (LANOXIN) 0.125 MG tablet Take 125 mcg by mouth daily. Takes after lunch    Yes Historical Provider, MD  diltiazem (DILACOR XR) 180 MG 24 hr capsule Take 180 mg by mouth at bedtime.   Yes Historical Provider, MD  furosemide (LASIX) 20 MG tablet Take 20 mg by mouth every morning.    Yes Historical Provider, MD  glipiZIDE (GLUCOTROL) 5 MG tablet Take 5 mg by mouth every morning.    Yes Historical Provider, MD  levothyroxine (SYNTHROID, LEVOTHROID) 25 MCG tablet Take 25 mcg by mouth every morning.    Yes Historical Provider, MD  losartan (COZAAR) 50 MG tablet Take 50 mg by mouth daily. Daily after lunch   Yes Historical Provider, MD  metFORMIN (GLUCOPHAGE) 500 MG tablet Take 500 mg by mouth 2 (two) times daily with a meal.     Yes Historical Provider, MD  Multiple Vitamins-Minerals (CENTRUM) tablet Take 1 tablet by mouth daily.   Yes Historical Provider, MD  nebivolol (BYSTOLIC) 10 MG tablet Take 10 mg by mouth every morning.    Yes Historical Provider, MD  omeprazole (PRILOSEC) 20 MG capsule Take 20 mg by mouth 2 (two) times daily.     Yes Historical Provider, MD  potassium chloride SA (K-DUR,KLOR-CON) 20 MEQ tablet Take 20 mEq by mouth every morning.    Yes Historical Provider, MD  raloxifene (EVISTA) 60 MG tablet Take 60 mg by mouth every morning.   Yes Historical Provider, MD  terazosin (HYTRIN) 5 MG capsule Take 5 mg by mouth at bedtime.   Yes Historical Provider, MD  warfarin (COUMADIN) 2 MG tablet Take 2-3 mg by mouth as directed. Takes one tablet daily except takes one and one-half tablet on Sundays **Takes at bedtime**   Yes Historical Provider, MD   BP 190/69  Pulse 75  Temp(Src) 98.2 F (36.8 C) (Oral)  Resp 18  SpO2 98% Physical Exam 1635: Physical examination:  Nursing notes reviewed; Vital signs and O2 SAT reviewed;  Constitutional: Well developed, Well nourished, Well hydrated, In no acute distress; Head:  Normocephalic, atraumatic; Eyes: EOMI, PERRL, No scleral icterus; ENMT: Mouth and pharynx normal, Mucous membranes moist; Neck: Supple, Full range of  motion, No lymphadenopathy; Cardiovascular: Regular rate and rhythm, No gallop; Respiratory: Breath sounds clear & equal bilaterally, No wheezes.  Speaking full sentences with ease, Normal respiratory effort/excursion; Chest: Nontender, Movement normal; Abdomen: Soft, Nontender, Nondistended, Normal bowel sounds; Genitourinary: No CVA tenderness; Spine:  No midline CS, TS, LS tenderness.; Extremities: Pulses normal, No tenderness, +1 LLE edema from foot to knee with calf asymmetry and chronic stasis changes. 2 well healed wounds left lower anterior-lateral leg. No open wounds. Left foot/ankle/knee/hip NT to palp. No erythema, no ecchymosis, no deformity. +FROM left knee, including able to lift extended LLE against gravity,  and extend left lower leg against resistance.  No ligamentous laxity.  No patellar or quad tendon step-offs.  NMS intact left foot, strong pedal pp. +plantarflexion of left foot w/calf squeeze.  No palpable gap left Achilles's tendon. Muscles compartments soft. No proximal fibular head tenderness.  No erythema, warmth, ecchymosis or deformity.; Neuro: AA&Ox3, Major CN grossly intact.  Speech clear. No gross focal motor or sensory deficits in extremities.; Skin: Color normal, Warm, Dry.   ED Course  Procedures    MDM  MDM Reviewed: previous chart, nursing note and vitals Interpretation: ultrasound and x-ray     Results for orders placed in visit on 02/10/14  POCT INR      Result Value Ref Range   INR 2.2     Dg Lumbar Spine Complete 03/04/2014   CLINICAL DATA:  Low back and left leg pain.  EXAM: LUMBAR SPINE - COMPLETE 4+ VIEW  COMPARISON:  CT scan from 2010.  FINDINGS: Stable alignment of the lumbar vertebral bodies with degenerative anterolisthesis of L4. Advanced degenerative disc disease again noted at T12-L1, L1-2 and L5-S1. Slightly progressive degenerative disc disease at L4-5. I do not see an acute lumbar compression fracture. Stable bilateral pars defects at L5. Stable  aortic calcifications. The bony pelvis is intact. Left hip hardware is noted.  IMPRESSION: Degenerative lumbar spondylosis with multilevel disc disease and facet disease. No definite acute fracture.  Bilateral pars defects at L5.   Electronically Signed   By: Kalman Jewels M.D.   On: 03/04/2014 17:58   Dg Hip Complete Left 03/04/2014   CLINICAL DATA:  Low back pain extending into left leg and left foot  EXAM: LEFT HIP - COMPLETE 2+ VIEW  COMPARISON:  None.  FINDINGS: Pelvic bones are intact. Intra medullary rod left femur, without evidence of acute complicating features. It is noted that the screws to the distal aspect of the intra medullary rod extends all the way across the femur from lateral to medial and extends about 1 cm into the medial soft tissues.  There is extensive femoral artery calcification.  There is a healed intertrochanteric left femur fracture.  IMPRESSION: See above description of status post ORIF left femur. No acute findings.   Electronically Signed   By: Skipper Cliche M.D.   On: 03/04/2014 17:59   Dg Ankle Complete Left 03/04/2014   CLINICAL DATA:  left leg pain left leg pain  EXAM: LEFT ANKLE COMPLETE - 3+ VIEW  COMPARISON:  05/20/2006  FINDINGS: Patient is osteopenic. There is a subtle transverse lucency across the medial malleolus suggesting nondisplaced fracture. There is overlying soft tissue swelling. Ankle mortise appears intact. Fibula and posterior malleolus intact. Small calcaneal spur at the plantar aponeurosis.  IMPRESSION: 1. Suspected nondisplaced fracture, medial malleolus, with overlying soft tissue swelling.   Electronically Signed   By: Arne Cleveland M.D.   On: 03/04/2014 18:01   US Venous Img Lower Unilateral Left 03/04/2014   CLINICAL DATA:  Left knee pain  EXAM: LEFT LOWER EXTREMITY VENOUS DUPLEX ULTRASOUND  TECHNIQUE: Doppler venous assessment of the left lower extremity deep venous system was performed, including characterization of spectral flow,  compressibility, and phasicity.  COMPARISON:  None.  FINDINGS: There is complete compressibility of the left common femoral, femoral, and popliteal veins. Doppler analysis demonstrates respiratory phasicity and augmentation of flow upon calf compression.  IMPRESSION: No evidence of DVT.   Electronically Signed   By: Maryclare Bean M.D.   On: 03/04/2014 16:18   Dg Knee  Complete 4 Views Left 03/04/2014   CLINICAL DATA:  Low back pain.  EXAM: LEFT KNEE - COMPLETE 4+ VIEW  COMPARISON:  No priors.  FINDINGS: Four views of the left knee demonstrate no acute displaced fracture, subluxation, dislocation, joint or soft tissue abnormality. Extensive vascular calcifications are noted. Mild degenerative changes of tricompartmental osteoarthritis are noted.  IMPRESSION: 1. No acute radiographic abnormality of the left knee. 2. Mild tricompartmental osteoarthritis. 3. Extensive atherosclerosis.   Electronically Signed   By: Vinnie Langton M.D.   On: 03/04/2014 17:59   Dg Foot Complete Left 03/04/2014   CLINICAL DATA:  left leg pain left leg pain  EXAM: LEFT FOOT - COMPLETE 3+ VIEW  COMPARISON:  05/20/2006  FINDINGS: Diffuse osteopenia. Normal alignment. Negative for fracture or dislocation. Calcaneal spur at the plantar aponeurosis.  IMPRESSION: Osteopenia without acute abnormality.   Electronically Signed   By: Arne Cleveland M.D.   On: 03/04/2014 18:02    1845:  Will apply cam walker due to pt's age and risk of fall with crutches/plaster splint. Pt already has a walker, shower chair, etc, at home. Pt wants to go home now. Dx and testing d/w pt and family.  Questions answered.  Verb understanding, agreeable to d/c home with outpt f/u.   Alfonzo Feller, DO 03/07/14 1931

## 2014-03-04 NOTE — Discharge Instructions (Signed)
°Emergency Department Resource Guide °1) Find a Doctor and Pay Out of Pocket °Although you won't have to find out who is covered by your insurance plan, it is a good idea to ask around and get recommendations. You will then need to call the office and see if the doctor you have chosen will accept you as a new patient and what types of options they offer for patients who are self-pay. Some doctors offer discounts or will set up payment plans for their patients who do not have insurance, but you will need to ask so you aren't surprised when you get to your appointment. ° °2) Contact Your Local Health Department °Not all health departments have doctors that can see patients for sick visits, but many do, so it is worth a call to see if yours does. If you don't know where your local health department is, you can check in your phone book. The CDC also has a tool to help you locate your state's health department, and many state websites also have listings of all of their local health departments. ° °3) Find a Walk-in Clinic °If your illness is not likely to be very severe or complicated, you may want to try a walk in clinic. These are popping up all over the country in pharmacies, drugstores, and shopping centers. They're usually staffed by nurse practitioners or physician assistants that have been trained to treat common illnesses and complaints. They're usually fairly quick and inexpensive. However, if you have serious medical issues or chronic medical problems, these are probably not your best option. ° °No Primary Care Doctor: °- Call [facility] at  832-8000 - they can help you locate a primary care doctor that  accepts your insurance, provides certain services, etc. °- Physician Referral Service- 1-800-533-3463 ° °Chronic Pain Problems: °Organization         Address  Phone   Notes  °[facility]  ([phone]) [phone] Patients need to be referred by their primary care doctor.  ° °Medication  Assistance: °Organization         Address  Phone   Notes  °[place] [facility] [address] °[place], [place] [address] (336) 641-8030 --Must be a resident of [place] °-- Must have NO insurance coverage whatsoever (no Medicaid/ Medicare, etc.) °-- The pt. MUST have a primary care doctor that directs their care regularly and follows them in the community °  °[facility]  (866) 331-1348   °[employer]  (888) 892-1162   ° °Agencies that provide inexpensive medical care: °Organization         Address  Phone   Notes  °[facility]  (336) 832-8035   °[facility]    (336) 832-7272   °[facility] [address] °[place], [place] [address] (336) 832-4777   °[facility] [address], °[place] (336) 271-4999   °[facility]    (336) 373-0678   °[facility]    (336) 272-1050   °[facility] ° [address], [place] Phone:  (336) 832-4444, Fax:  (336) 832-4440 Hours of Operation:  9 am - 6 pm, M-F.  Also accepts Medicaid/Medicare and self-pay.  °[facility] ° [address], [place] Phone: (336) [phone], Fax: (336) [phone]. Hours of Operation:  8:30 am - 5:30 pm, M-F.  Also accepts Medicaid and self-pay.  °[facility] [place] [address], [place] Phone: (336) 878-6027   °[facility] [address], [place], [place] (336)723-1848, Ext. 123 Mondays & Thursdays: 7-9 AM.  First 15 patients are seen on a first come, first serve basis. °  ° °Medicaid-accepting [place] Providers: ° °Organization         Address  Phone   Notes  °[facility] [address], [place] (336) 641-2100 Also accepts self-pay patients.  °[facility] [address], [place] ° (336) 856-9996   °[facility] [address], [place]  (336) 288-8857   °[facility] [address] [place] [address], [place] (336) 299-7000   °Veita Bland [address], [place]  ° (336) 373-1557 Only accepts [place] Access Medicaid patients after they have their name applied to their card.  ° °Self-Pay (no insurance) in [place]: ° °Organization         Address  Phone   Notes  °Sickle Cell Patients, [facility] [address], [place] (336) 832-1970   °[facility] [address], [place] (336) 832-4400   °[facility] ° [address], [place] (336) 992-4800   °[facility]/Dr. Osei-Bonsu ° [address] [place] [address], [place] or [address], [place] (336) [phone] Phone number for both [place] and [place] locations is the same.  °[facility] [address], [place] (336) 299-0000   °[facility] [address] [place] [address], [place] or [address] (336) 852-7530 °(336) 878-2260   °[facility] [address], [place] (336) 350-1642, phone; (336) 294-5005, fax Sees patients 1st and 3rd Saturday of every month.  Must not qualify for public or private insurance (i.e. Medicaid, Medicare, [place] Health Choice, Veterans' Benefits) • Household income should be no more than 200% of the poverty level •The clinic cannot treat you if you are pregnant or think you are pregnant • Sexually transmitted diseases are not treated at the clinic.  ° ° °Dental Care: °Organization         Address  Phone  Notes  °[place] [facility] [address], [place] (336) 641-6152 Accepts children up to age 21 who are enrolled in Medicaid or [place] Health Choice; pregnant women with a Medicaid card; and children who have applied for Medicaid or [place] Health Choice, but were declined, whose parents can pay a reduced fee at time of service.  °[place]  [facility] [place]  [address], [place] (336) 641-7733 Accepts children up to age 21 who are enrolled in Medicaid or [place] Health Choice; pregnant women with a Medicaid card; and children who have applied for Medicaid or [place] Health Choice, but were declined, whose parents can pay a reduced fee at time of service.  °[facility] ° [address], [place] (336) 641-4533 Patients are seen by appointment only. Walk-ins are not accepted. [facility] will see patients 18 years of age and older. °Monday - Tuesday (8am-5pm) °Most Wednesdays (8:30-5pm) °$30 per visit, cash only  °[facility] ° [address], [place] (336) 641-4533 Patients are seen by appointment only. Walk-ins are not accepted. [facility] will see patients 18 years of age and older. °One   Wednesday Evening (Monthly: Volunteer Based).  $30 per visit, cash only  °[facility]  (919) 537-3737 for adults; Children under age 4, call Graduate Pediatric Dentistry at ([phone]) [phone]. Children aged 4-14, please call ([phone]) [phone] to request a pediatric application. ° Dental services are provided in all areas of dental care including fillings, crowns and bridges, complete and partial dentures, implants, gum treatment, root canals, and extractions. Preventive care is also provided. Treatment is provided to both adults and children. °Patients are selected via a lottery and there is often a waiting list. °  °[facility] [address], °[place] ° (336) 763-8833 [email or web address] °  °[facility] [address], [place], [place] (336)723-1848, Ext. 123 Second and Fourth Thursday of each month, opens at 6:30 AM; Clinic ends at 9 AM.  Patients are seen on a first-come first-served basis, and a limited number are seen during each clinic.  ° °[facility] ° [address], [place], [place] (336) 723-7904    Eligibility Requirements °You must have lived in [place], [place], or [place] counties for at least the last three months. °  You cannot be eligible for state or federal sponsored healthcare insurance, including Veterans Administration, Medicaid, or Medicare. °  You generally cannot be eligible for healthcare insurance through your employer.  °  How to apply: °Eligibility screenings are held every Tuesday and Wednesday afternoon from 1:00 pm until 4:00 pm. You do not need an appointment for the interview!  °[facility] [address], [place], [place] 336-631-2330   °[facility]  336-342-8273   °[facility]  336-703-3100   °[facility]  336-570-6415   ° °Behavioral Health Resources in the Community: °Intensive Outpatient Programs °Organization         Address  Phone  Notes  °[facility] [address], [place], [place] 336-878-6098   °[facility] [address], [place], [place] 336-832-9800   °[facility] [address], [place], [place] ° 336-882-2125   °[facility] [address],  °[place], [place] 1-800-853-5163 or 336-641-4981   °Substance Abuse Resources °Organization         Address  Phone  Notes  °[facility]  336-882-2125   °[facility]  336-784-9470   °[facility]  336-285-9073   °[facility]  336-845-3988   °Residential & Outpatient Substance Abuse Program  1-800-659-3381   °Psychological Services °Organization         Address  Phone  Notes  °[facility]  336- 832-9600   °[facility]  336- 378-7881   °[facility] [address], [place] 1-800-853-5163 or 336-641-4981   ° °Mobile Crisis Teams °Organization         Address  Phone  Notes  °[facility], Mobile Crisis Care Unit  1-877-626-1772   °Assertive °[facility] ° [address]  [place], [place] 336-834-9664   °Sharon DeEsch [address] °[place] 336-554-5454   ° °Self-Help/Support Groups °Organization         Address  Phone             Notes  °[facility]  - variety of support groups  336- 373-1402 Call for more information  °Narcotics Anonymous (NA), [facility] [address], °[place]   2 meetings at this location  ° °  Residential Treatment Programs Organization         Address  Phone  Notes  [facility] [address]    [place]  1-(252) 197-1374   [facility]  [address] [place] [address], [place], [place]   [place], [place] [phone] Admissions: 8am-3pm M-F  [facility] [place] [address]    [place], [place] 935-701-7793   [facility] [address] [place], [place], [place]   [facility] [address],  [place], [place]   [facility] Intensive Outpatient [address], [place], [place]   [facility] ([place]  [place], [place] 1-(530)236-3470 or 616-222-3110   [facility] [address] [address], [place], [place] Accepts Medicaid  [facility] [address],  [place] 1-979-114-4820 Substance Abuse/Addiction Treatment   [facility]         Address  Phone  Notes  [facility]  870-049-9704   Domenic Schwab, PhD [address] [place], [place]   854-070-1688 or 774-849-6013   [facility] [place], [place] [phone]   [facility] [address], [place], [place] 714-400-9758 Insurance/Medicaid/sponsorship through [facility] Families [address]                                    [place], [place] [phone] [place] [address], [place] [phone]    Dr. Adele Schilder  ([phone]) [phone]   [facility] Dept. 1) [address] [place] 2) [place] 3)  [place], [place] [phone] [phone]  (567)[phone]   [place] 872-472-7229 or [phone] (After Hours)       Take over the counter tylenol, as directed on packaging, as needed for discomfort. Apply moist heat or ice to the area(s) of discomfort, for 15 minutes at a time, several times per day for the next few days.  Do not fall asleep on a heating or ice pack. Elevated your left ankle as much as possible. Wear the cam walker and walk with your walker until you are seen in follow up. Call your regular Orthopedic doctor on Monday to schedule a follow up appointment in the next 3 days.  Return to the Emergency Department immediately if worsening.

## 2014-03-04 NOTE — ED Notes (Signed)
PT c/o left lower leg pain. Pt denies any injury.

## 2014-03-04 NOTE — ED Notes (Signed)
Pt in ultrasound at this time. Son present in room.

## 2014-03-04 NOTE — ED Notes (Signed)
Lle pain and swelling. Recent wound healing treatment to lle. Pt on coumadin.

## 2014-03-04 NOTE — ED Notes (Signed)
MD at bedside. 

## 2014-03-10 ENCOUNTER — Ambulatory Visit (INDEPENDENT_AMBULATORY_CARE_PROVIDER_SITE_OTHER): Payer: Medicare Other | Admitting: *Deleted

## 2014-03-10 DIAGNOSIS — Z5181 Encounter for therapeutic drug level monitoring: Secondary | ICD-10-CM

## 2014-03-10 DIAGNOSIS — I4891 Unspecified atrial fibrillation: Secondary | ICD-10-CM | POA: Diagnosis not present

## 2014-03-10 DIAGNOSIS — G589 Mononeuropathy, unspecified: Secondary | ICD-10-CM | POA: Diagnosis not present

## 2014-03-10 DIAGNOSIS — M25569 Pain in unspecified knee: Secondary | ICD-10-CM | POA: Diagnosis not present

## 2014-03-10 DIAGNOSIS — I639 Cerebral infarction, unspecified: Secondary | ICD-10-CM

## 2014-03-10 DIAGNOSIS — Z7901 Long term (current) use of anticoagulants: Secondary | ICD-10-CM

## 2014-03-10 DIAGNOSIS — I6350 Cerebral infarction due to unspecified occlusion or stenosis of unspecified cerebral artery: Secondary | ICD-10-CM | POA: Diagnosis not present

## 2014-03-10 DIAGNOSIS — M1990 Unspecified osteoarthritis, unspecified site: Secondary | ICD-10-CM | POA: Diagnosis not present

## 2014-03-10 LAB — POCT INR: INR: 1.9

## 2014-03-21 ENCOUNTER — Encounter (HOSPITAL_COMMUNITY): Payer: Self-pay | Admitting: Emergency Medicine

## 2014-03-21 ENCOUNTER — Telehealth: Payer: Self-pay | Admitting: Orthopedic Surgery

## 2014-03-21 ENCOUNTER — Emergency Department (HOSPITAL_COMMUNITY)
Admission: EM | Admit: 2014-03-21 | Discharge: 2014-03-21 | Disposition: A | Discharge status: Home / Self Care | Payer: Medicare Other | Attending: Emergency Medicine | Admitting: Emergency Medicine

## 2014-03-21 DIAGNOSIS — Z853 Personal history of malignant neoplasm of breast: Secondary | ICD-10-CM | POA: Insufficient documentation

## 2014-03-21 DIAGNOSIS — Z8619 Personal history of other infectious and parasitic diseases: Secondary | ICD-10-CM | POA: Diagnosis not present

## 2014-03-21 DIAGNOSIS — E119 Type 2 diabetes mellitus without complications: Secondary | ICD-10-CM | POA: Insufficient documentation

## 2014-03-21 DIAGNOSIS — Z79899 Other long term (current) drug therapy: Secondary | ICD-10-CM | POA: Diagnosis not present

## 2014-03-21 DIAGNOSIS — E039 Hypothyroidism, unspecified: Secondary | ICD-10-CM | POA: Insufficient documentation

## 2014-03-21 DIAGNOSIS — Z8739 Personal history of other diseases of the musculoskeletal system and connective tissue: Secondary | ICD-10-CM | POA: Insufficient documentation

## 2014-03-21 DIAGNOSIS — Z7901 Long term (current) use of anticoagulants: Secondary | ICD-10-CM | POA: Diagnosis not present

## 2014-03-21 DIAGNOSIS — IMO0002 Reserved for concepts with insufficient information to code with codable children: Secondary | ICD-10-CM | POA: Diagnosis not present

## 2014-03-21 DIAGNOSIS — Z791 Long term (current) use of non-steroidal anti-inflammatories (NSAID): Secondary | ICD-10-CM | POA: Insufficient documentation

## 2014-03-21 DIAGNOSIS — M25562 Pain in left knee: Secondary | ICD-10-CM

## 2014-03-21 DIAGNOSIS — M25569 Pain in unspecified knee: Secondary | ICD-10-CM | POA: Diagnosis not present

## 2014-03-21 DIAGNOSIS — Z8709 Personal history of other diseases of the respiratory system: Secondary | ICD-10-CM | POA: Diagnosis not present

## 2014-03-21 DIAGNOSIS — I4891 Unspecified atrial fibrillation: Secondary | ICD-10-CM | POA: Diagnosis not present

## 2014-03-21 DIAGNOSIS — I5032 Chronic diastolic (congestive) heart failure: Secondary | ICD-10-CM | POA: Insufficient documentation

## 2014-03-21 DIAGNOSIS — I10 Essential (primary) hypertension: Secondary | ICD-10-CM | POA: Insufficient documentation

## 2014-03-21 DIAGNOSIS — Z872 Personal history of diseases of the skin and subcutaneous tissue: Secondary | ICD-10-CM | POA: Diagnosis not present

## 2014-03-21 MED ORDER — NAPROXEN 250 MG PO TABS: 250.0000 mg | ORAL_TABLET | Freq: Two times a day (BID) | ORAL | Status: DC

## 2014-03-21 MED ORDER — PREDNISONE 10 MG PO TABS: 20.0000 mg | ORAL_TABLET | Freq: Every day | ORAL | Status: DC

## 2014-03-21 NOTE — ED Notes (Signed)
Pt c/o pain and swelling to left knee for the past few days.  Denies injury.  FOot warm to touch, pedal pulse present.

## 2014-03-21 NOTE — Telephone Encounter (Signed)
Patient called this morning to relay that she was having knee/leg pain, and was having difficulty walking.  She also mentioned having had surgery by Dr [name] several years ago; also mentioned having been treated for a wound on her leg by [facility] wound center.  She asked for appointment today, which I had relayed was unfortunately not available, and that I would need to further discuss what she is needing; we then lost connection on the phone.  I pulled up chart based on name and date of birth, and called her back, and she relayed that she just returned from [facility] Emergency room.  States she was given prescriptions, and said she will see how she does; she had also spoken with her primary care doctor's office today, Dr. Wende Neighbors.  She will call back, or will have Dr Nevada Crane, call us back if needs to schedule.

## 2014-03-21 NOTE — Discharge Instructions (Signed)
Ice pack, medication for pain and prednisone. Followup with orthopedic doctor

## 2014-03-21 NOTE — ED Provider Notes (Signed)
CSN: [id number]     Arrival date & time 03/21/14  1509 History   First MD Initiated Contact with Patient 03/21/14 1551     Chief Complaint  Patient presents with  . Knee Pain     (Consider location/radiation/quality/duration/timing/severity/associated sxs/prior Treatment) HPI...Marland Kitchen persistent left knee pain for several weeks.  No trauma.   Patient seen on March 04, 2014.   Plain films of left knee show osteoarthritis, but no fracture.  Ultrasound revealed no DVT.   No new injury. No other complaints   Past Medical History  Diagnosis Date  . Hypertension   . Carcinoma of breast     left masectomy in 1995  . Atrial fibrillation 2007    CHF with preserved LV systolic function - 2992; moderate LVH  . Arteriosclerotic cardiovascular disease (ASCVD)     cath in 2001- 40% LAD and 50% RCA; stent for 80% LAD in 5/04; residual 50% LAD, 80% small D1, 70% small OM1 50% ostial RCA, nl EF   . MVP (mitral valve prolapse)     moderate; with moderate MR  . PVD (peripheral vascular disease)     ABIs of 0.64 and 0.59, right and left leg in 2009  . CVD (cerebrovascular disease)     plaque w/o focal disease in 2006; h/o CVA  . Diabetes mellitus type II     no insulin   . Hypothyroid   . Osteoporosis   . Edema   . Herpes zoster   . Chronic hoarseness   . Bronchitis     history  . Varicose veins of legs 08/20/2011  . Lower extremity edema 08/20/2011  . Chronic diastolic heart failure 12/06/8339  . 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 Shingles   . Ulcer     left lower leg  . Peripheral neuropathy    Past Surgical History  Procedure Laterality Date  . Orif of left hip  05/22/06    [name]  . Left masectomy  1995  . Cholecystectomy  2006  . Abdominal hysterectomy      abd?  . Cataract extraction, bilateral    . Colonoscopy  Date unknown  . Coronary angioplasty with stent placement     Family History  Problem Relation Age of Onset  . Diabetes Mother    History  Substance Use Topics  . Smoking  status: Never Smoker   . Smokeless tobacco: Never Used     Comment: tobacco use - no   . Alcohol Use: No   OB History   Grav Para Term Preterm Abortions TAB SAB Ect Mult Living                 Review of Systems  All other systems reviewed and are negative.     Allergies  Review of patient's allergies indicates no known allergies.  Home Medications   Prior to Admission medications   Medication Sig Start Date End Date Taking? Authorizing Provider  Acetaminophen 500 MG coapsule Take 250 mg by mouth daily as needed for fever or pain.   Yes Historical Provider, MD  beta carotene w/minerals (OCUVITE) tablet Take 1 tablet by mouth daily.     Yes Historical Provider, MD  Calcium Carbonate-Vitamin D (CALCARB 600/D PO) Take 1 tablet by mouth at bedtime.   Yes Historical Provider, MD  digoxin (LANOXIN) 0.125 MG tablet Take 125 mcg by mouth daily. Takes after lunch   Yes Historical Provider, MD  diltiazem (DILACOR XR) 180 MG 24 hr capsule Take 180 mg  by mouth at bedtime.   Yes Historical Provider, MD  furosemide (LASIX) 20 MG tablet Take 20 mg by mouth every morning.    Yes Historical Provider, MD  glipiZIDE (GLUCOTROL) 5 MG tablet Take 5 mg by mouth every morning.    Yes Historical Provider, MD  levothyroxine (SYNTHROID, LEVOTHROID) 25 MCG tablet Take 25 mcg by mouth every morning.    Yes Historical Provider, MD  losartan (COZAAR) 50 MG tablet Take 50 mg by mouth daily. Daily after lunch   Yes Historical Provider, MD  metFORMIN (GLUCOPHAGE) 500 MG tablet Take 500 mg by mouth 2 (two) times daily with a meal.     Yes Historical Provider, MD  Multiple Vitamins-Minerals (CENTRUM) tablet Take 1 tablet by mouth daily.   Yes Historical Provider, MD  nebivolol (BYSTOLIC) 10 MG tablet Take 10 mg by mouth every morning.    Yes Historical Provider, MD  omeprazole (PRILOSEC) 20 MG capsule Take 20 mg by mouth 2 (two) times daily.     Yes Historical Provider, MD  potassium chloride SA (K-DUR,KLOR-CON) 20  MEQ tablet Take 20 mEq by mouth every morning.    Yes Historical Provider, MD  raloxifene (EVISTA) 60 MG tablet Take 60 mg by mouth every morning.   Yes Historical Provider, MD  terazosin (HYTRIN) 5 MG capsule Take 5 mg by mouth at bedtime.   Yes Historical Provider, MD  warfarin (COUMADIN) 2 MG tablet Take 2-3 mg by mouth as directed. Takes one tablet daily except takes one and one-half tablet on Sundays **Takes at bedtime**   Yes Historical Provider, MD  naproxen (NAPROSYN) 250 MG tablet Take 1 tablet (250 mg total) by mouth 2 (two) times daily with a meal. 03/21/14   Nat Christen, MD  predniSONE (DELTASONE) 10 MG tablet Take 2 tablets (20 mg total) by mouth daily. 03/21/14   Nat Christen, MD   BP 132/64  Pulse 70  Temp(Src) 98.2 F (36.8 C) (Oral)  Resp 18  Ht 5' (1.524 m)  Wt 132 lb (59.875 kg)  BMI 25.78 kg/m2  SpO2 98% Physical Exam  Constitutional: She is oriented to person, place, and time. She appears well-developed and well-nourished.  Musculoskeletal:  Left knee:  Most tender in the medial inferior aspect of knee. No erythema or edema. Minimal pain with range of motion.  Neurological: She is alert and oriented to person, place, and time.  Skin: Skin is warm and dry.  Psychiatric: She has a normal mood and affect.    ED Course  Procedures (including critical care time) Labs Review Labs Reviewed - No data to display  Imaging Review No results found.   EKG Interpretation None      MDM   Final diagnoses:  Left knee pain    No evidence of a septic joint. Rx Naprosyn 250 mg and prednisone.  Patient will followup with orthopedic surgeon for potential steroid injection.    Nat Christen, MD 03/21/14 [id number]

## 2014-03-21 NOTE — ED Notes (Signed)
Was seen at wound clinic.  Discharge after removing bandage to left lower leg.  Nurse followed up for two weeks following d/cd  Had small well healing wound to left lower outer ankle.

## 2014-03-21 NOTE — ED Notes (Signed)
Pain to left knee, rates pain 9.  Pain progressively getting worse and noted increasing swelling this week.

## 2014-03-31 ENCOUNTER — Ambulatory Visit (INDEPENDENT_AMBULATORY_CARE_PROVIDER_SITE_OTHER): Payer: Medicare Other | Admitting: *Deleted

## 2014-03-31 DIAGNOSIS — I4891 Unspecified atrial fibrillation: Secondary | ICD-10-CM | POA: Diagnosis not present

## 2014-03-31 DIAGNOSIS — I6350 Cerebral infarction due to unspecified occlusion or stenosis of unspecified cerebral artery: Secondary | ICD-10-CM

## 2014-03-31 DIAGNOSIS — Z5181 Encounter for therapeutic drug level monitoring: Secondary | ICD-10-CM

## 2014-03-31 DIAGNOSIS — I639 Cerebral infarction, unspecified: Secondary | ICD-10-CM

## 2014-03-31 DIAGNOSIS — Z7901 Long term (current) use of anticoagulants: Secondary | ICD-10-CM

## 2014-03-31 LAB — POCT INR: INR: 2.3

## 2014-04-04 ENCOUNTER — Ambulatory Visit (INDEPENDENT_AMBULATORY_CARE_PROVIDER_SITE_OTHER): Payer: Medicare Other | Admitting: Orthopedic Surgery

## 2014-04-04 VITALS — BP 142/61 | Ht 60.0 in | Wt 132.0 lb

## 2014-04-04 DIAGNOSIS — M1710 Unilateral primary osteoarthritis, unspecified knee: Secondary | ICD-10-CM

## 2014-04-04 DIAGNOSIS — M1712 Unilateral primary osteoarthritis, left knee: Secondary | ICD-10-CM

## 2014-04-04 MED ORDER — ACETAMINOPHEN-CODEINE #3 300-30 MG PO TABS: 1.0000 | ORAL_TABLET | Freq: Four times a day (QID) | ORAL | Status: DC | PRN

## 2014-04-05 ENCOUNTER — Encounter: Payer: Self-pay | Admitting: Orthopedic Surgery

## 2014-04-05 NOTE — Progress Notes (Signed)
New patient  Chief Complaint  Patient presents with  . Knee Pain    Left knee pain, er on 03-04-14., no injury.    HISTORY:  Two-month history of left knee pain with giving way catching swelling. She also has stabbing and aching 10 out of 10 pain with some radiation up into the hip area and she has a peripheral neuropathy in the foot. X-rays of her knee show degenerative arthritis. She has been medicated with various ointments, naproxen, and prednisone. She has difficulty standing and walking. She is noted to have improvement in symptoms if she doesn't walk a lot sits and applies ice.  Review of systems has been recorded reviewed and signed and scanned into the chart  Past Medical History  Diagnosis Date  . Hypertension   . Carcinoma of breast     left masectomy in 1995  . Atrial fibrillation 2007    CHF with preserved LV systolic function - 6834; moderate LVH  . Arteriosclerotic cardiovascular disease (ASCVD)     cath in 2001- 40% LAD and 50% RCA; stent for 80% LAD in 5/04; residual 50% LAD, 80% small D1, 70% small OM1 50% ostial RCA, nl EF   . MVP (mitral valve prolapse)     moderate; with moderate MR  . PVD (peripheral vascular disease)     ABIs of 0.64 and 0.59, right and left leg in 2009  . CVD (cerebrovascular disease)     plaque w/o focal disease in 2006; h/o CVA  . Diabetes mellitus type II     no insulin   . Hypothyroid   . Osteoporosis   . Edema   . Herpes zoster   . Chronic hoarseness   . Bronchitis     history  . Varicose veins of legs 08/20/2011  . Lower extremity edema 08/20/2011  . Chronic diastolic heart failure 1/96/2229  . 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 Shingles   . Ulcer     left lower leg  . Peripheral neuropathy    Past Surgical History  Procedure Laterality Date  . Orif of left hip  05/22/06    [name]  . Left masectomy  1995  . Cholecystectomy  2006  . Abdominal hysterectomy      abd?  . Cataract extraction, bilateral    . Colonoscopy  Date  unknown  . Coronary angioplasty with stent placement      BP 142/61  Ht 5' (1.524 m)  Wt 132 lb (59.875 kg)  BMI 25.78 kg/m2 General appearance is normal she is well-kept well-groomed. She is oriented to person place and time. She is a pleasant mood. She presents in a wheelchair today secondary to her difficulties with ambulation. Inspection of the left knee shows a valgus deformity small joint effusion medial and lateral joint line tenderness knee flexion 110 slight flexion contracture 5. Collateral ligaments and cruciate ligaments are stable. Motor exam shows no weakness or atrophy. The skin is normal and intact without rash or laceration or ulceration  Has good distal pulse, skin sensation is normal around the knee. There is no swelling of the lymph nodes. There are no pathologic reflexes.  X-rays show arthritis  The patient is 80 years old it is doubtful she did have any surgical intervention expresses to Mrs. Blodgett and her son. I offered him injection of the left knee with a knee brace and Tylenol #3 for pain and when necessary followup  Knee  Injection Procedure Note  Pre-operative Diagnosis: left knee oa  Post-operative Diagnosis: same  Indications: pain  Anesthesia: ethyl chloride   Procedure Details   Verbal consent was obtained for the procedure. Time out was completed.The joint was prepped with alcohol, followed by  Ethyl chloride spray and A 20 gauge needle was inserted into the knee via lateral approach; 11ml 1% lidocaine and 1 ml of depomedrol  was then injected into the joint . The needle was removed and the area cleansed and dressed.  Complications:  None; patient tolerated the procedure well.

## 2014-04-08 DIAGNOSIS — E1149 Type 2 diabetes mellitus with other diabetic neurological complication: Secondary | ICD-10-CM | POA: Diagnosis not present

## 2014-04-08 DIAGNOSIS — B351 Tinea unguium: Secondary | ICD-10-CM | POA: Diagnosis not present

## 2014-04-12 ENCOUNTER — Encounter (HOSPITAL_COMMUNITY): Payer: Self-pay | Admitting: Emergency Medicine

## 2014-04-12 ENCOUNTER — Telehealth: Payer: Self-pay | Admitting: Orthopedic Surgery

## 2014-04-12 ENCOUNTER — Observation Stay (HOSPITAL_COMMUNITY)
Admission: EM | Admit: 2014-04-12 | Discharge: 2014-04-13 | Disposition: A | Discharge status: Home / Self Care | Payer: Medicare Other | Attending: Family Medicine | Admitting: Family Medicine

## 2014-04-12 ENCOUNTER — Observation Stay (HOSPITAL_COMMUNITY): Payer: Medicare Other

## 2014-04-12 ENCOUNTER — Emergency Department (HOSPITAL_COMMUNITY): Payer: Medicare Other

## 2014-04-12 DIAGNOSIS — Z8709 Personal history of other diseases of the respiratory system: Secondary | ICD-10-CM | POA: Insufficient documentation

## 2014-04-12 DIAGNOSIS — I059 Rheumatic mitral valve disease, unspecified: Secondary | ICD-10-CM | POA: Diagnosis not present

## 2014-04-12 DIAGNOSIS — Z872 Personal history of diseases of the skin and subcutaneous tissue: Secondary | ICD-10-CM | POA: Insufficient documentation

## 2014-04-12 DIAGNOSIS — Z8673 Personal history of transient ischemic attack (TIA), and cerebral infarction without residual deficits: Secondary | ICD-10-CM | POA: Insufficient documentation

## 2014-04-12 DIAGNOSIS — G609 Hereditary and idiopathic neuropathy, unspecified: Secondary | ICD-10-CM | POA: Insufficient documentation

## 2014-04-12 DIAGNOSIS — M25559 Pain in unspecified hip: Principal | ICD-10-CM | POA: Insufficient documentation

## 2014-04-12 DIAGNOSIS — R141 Gas pain: Secondary | ICD-10-CM | POA: Diagnosis not present

## 2014-04-12 DIAGNOSIS — R6889 Other general symptoms and signs: Secondary | ICD-10-CM | POA: Diagnosis not present

## 2014-04-12 DIAGNOSIS — I5032 Chronic diastolic (congestive) heart failure: Secondary | ICD-10-CM | POA: Diagnosis not present

## 2014-04-12 DIAGNOSIS — I739 Peripheral vascular disease, unspecified: Secondary | ICD-10-CM | POA: Diagnosis not present

## 2014-04-12 DIAGNOSIS — M24159 Other articular cartilage disorders, unspecified hip: Secondary | ICD-10-CM | POA: Diagnosis present

## 2014-04-12 DIAGNOSIS — R600 Localized edema: Secondary | ICD-10-CM | POA: Diagnosis present

## 2014-04-12 DIAGNOSIS — M549 Dorsalgia, unspecified: Secondary | ICD-10-CM | POA: Insufficient documentation

## 2014-04-12 DIAGNOSIS — Z7901 Long term (current) use of anticoagulants: Secondary | ICD-10-CM | POA: Insufficient documentation

## 2014-04-12 DIAGNOSIS — I4891 Unspecified atrial fibrillation: Secondary | ICD-10-CM | POA: Diagnosis not present

## 2014-04-12 DIAGNOSIS — IMO0002 Reserved for concepts with insufficient information to code with codable children: Secondary | ICD-10-CM | POA: Diagnosis not present

## 2014-04-12 DIAGNOSIS — E039 Hypothyroidism, unspecified: Secondary | ICD-10-CM | POA: Diagnosis not present

## 2014-04-12 DIAGNOSIS — I2510 Atherosclerotic heart disease of native coronary artery without angina pectoris: Secondary | ICD-10-CM | POA: Diagnosis present

## 2014-04-12 DIAGNOSIS — E119 Type 2 diabetes mellitus without complications: Secondary | ICD-10-CM | POA: Diagnosis not present

## 2014-04-12 DIAGNOSIS — Z853 Personal history of malignant neoplasm of breast: Secondary | ICD-10-CM | POA: Insufficient documentation

## 2014-04-12 DIAGNOSIS — M1710 Unilateral primary osteoarthritis, unspecified knee: Secondary | ICD-10-CM | POA: Insufficient documentation

## 2014-04-12 DIAGNOSIS — Z79899 Other long term (current) drug therapy: Secondary | ICD-10-CM | POA: Diagnosis not present

## 2014-04-12 DIAGNOSIS — M169 Osteoarthritis of hip, unspecified: Secondary | ICD-10-CM

## 2014-04-12 DIAGNOSIS — I10 Essential (primary) hypertension: Secondary | ICD-10-CM | POA: Diagnosis not present

## 2014-04-12 DIAGNOSIS — I8390 Asymptomatic varicose veins of unspecified lower extremity: Secondary | ICD-10-CM | POA: Diagnosis not present

## 2014-04-12 DIAGNOSIS — I7090 Unspecified atherosclerosis: Secondary | ICD-10-CM | POA: Diagnosis not present

## 2014-04-12 DIAGNOSIS — M76899 Other specified enthesopathies of unspecified lower limb, excluding foot: Secondary | ICD-10-CM | POA: Diagnosis not present

## 2014-04-12 DIAGNOSIS — M25551 Pain in right hip: Secondary | ICD-10-CM

## 2014-04-12 DIAGNOSIS — M810 Age-related osteoporosis without current pathological fracture: Secondary | ICD-10-CM | POA: Diagnosis not present

## 2014-04-12 DIAGNOSIS — Z8619 Personal history of other infectious and parasitic diseases: Secondary | ICD-10-CM | POA: Diagnosis not present

## 2014-04-12 DIAGNOSIS — R140 Abdominal distension (gaseous): Secondary | ICD-10-CM | POA: Diagnosis present

## 2014-04-12 DIAGNOSIS — R143 Flatulence: Secondary | ICD-10-CM | POA: Diagnosis not present

## 2014-04-12 DIAGNOSIS — M47817 Spondylosis without myelopathy or radiculopathy, lumbosacral region: Secondary | ICD-10-CM | POA: Diagnosis not present

## 2014-04-12 DIAGNOSIS — M1610 Unilateral primary osteoarthritis, unspecified hip: Secondary | ICD-10-CM

## 2014-04-12 DIAGNOSIS — E785 Hyperlipidemia, unspecified: Secondary | ICD-10-CM | POA: Diagnosis present

## 2014-04-12 DIAGNOSIS — M7061 Trochanteric bursitis, right hip: Secondary | ICD-10-CM

## 2014-04-12 LAB — GLUCOSE, CAPILLARY
GLUCOSE-CAPILLARY: 335 mg/dL — AB (ref 70–99)
GLUCOSE-CAPILLARY: 290 mg/dL — AB (ref 70–99)

## 2014-04-12 LAB — PROTIME-INR
INR: 2.92 — ABNORMAL HIGH (ref 0.00–1.49)
Prothrombin Time: 30.5 seconds — ABNORMAL HIGH (ref 11.6–15.2)

## 2014-04-12 LAB — BASIC METABOLIC PANEL
Anion gap: 7 (ref 5–15)
BUN: 30 mg/dL — ABNORMAL HIGH (ref 6–23)
CALCIUM: 8.4 mg/dL (ref 8.4–10.5)
CHLORIDE: 108 meq/L (ref 96–112)
CO2: 26 mEq/L (ref 19–32)
CREATININE: 0.64 mg/dL (ref 0.50–1.10)
GFR calc non Af Amer: 76 mL/min — ABNORMAL LOW (ref >90)
GFR, EST AFRICAN AMERICAN: 88 mL/min — AB (ref >90)
Glucose, Bld: 88 mg/dL (ref 70–99)
Potassium: 4.2 mEq/L (ref 3.7–5.3)
Sodium: 141 mEq/L (ref 137–147)

## 2014-04-12 LAB — CBC WITH DIFFERENTIAL/PLATELET
BASOS ABS: 0 10*3/uL (ref 0.0–0.1)
BASOS PCT: 0 % (ref 0–1)
EOS ABS: 0.1 10*3/uL (ref 0.0–0.7)
Eosinophils Relative: 2 % (ref 0–5)
HEMATOCRIT: 35.1 % — AB (ref 36.0–46.0)
Hemoglobin: 11.7 g/dL — ABNORMAL LOW (ref 12.0–15.0)
Lymphocytes Relative: 28 % (ref 12–46)
Lymphs Abs: 1.6 10*3/uL (ref 0.7–4.0)
MCH: 30.1 pg (ref 26.0–34.0)
MCHC: 33.3 g/dL (ref 30.0–36.0)
MCV: 90.2 fL (ref 78.0–100.0)
MONO ABS: 0.6 10*3/uL (ref 0.1–1.0)
Monocytes Relative: 10 % (ref 3–12)
NEUTROS ABS: 3.5 10*3/uL (ref 1.7–7.7)
Neutrophils Relative %: 60 % (ref 43–77)
Platelets: 165 10*3/uL (ref 150–400)
RBC: 3.89 MIL/uL (ref 3.87–5.11)
RDW: 15.5 % (ref 11.5–15.5)
WBC: 5.8 10*3/uL (ref 4.0–10.5)

## 2014-04-12 LAB — HEMOGLOBIN A1C
HEMOGLOBIN A1C: 6.8 % — AB (ref <5.7)
MEAN PLASMA GLUCOSE: 148 mg/dL — AB (ref <117)

## 2014-04-12 LAB — TSH: TSH: 1.8 u[IU]/mL (ref 0.350–4.500)

## 2014-04-12 MED ORDER — TRAMADOL HCL 50 MG PO TABS: 50.0000 mg | ORAL_TABLET | Freq: Once | ORAL | Status: DC

## 2014-04-12 MED ORDER — TRAMADOL-ACETAMINOPHEN 37.5-325 MG PO TABS: 1.0000 | ORAL_TABLET | ORAL | Status: DC | PRN

## 2014-04-12 MED ORDER — POTASSIUM CHLORIDE CRYS ER 20 MEQ PO TBCR
20.0000 meq | EXTENDED_RELEASE_TABLET | Freq: Every morning | ORAL | Status: DC
  Administered 2014-04-12 – 2014-04-13 (×2): 20 meq via ORAL
  Filled 2014-04-12 (×2): qty 1

## 2014-04-12 MED ORDER — OCUVITE PO TABS
1.0000 | ORAL_TABLET | Freq: Every day | ORAL | Status: DC
  Administered 2014-04-12 – 2014-04-13 (×2): 1 via ORAL
  Filled 2014-04-12 (×4): qty 1

## 2014-04-12 MED ORDER — RALOXIFENE HCL 60 MG PO TABS
60.0000 mg | ORAL_TABLET | Freq: Every morning | ORAL | Status: DC
  Administered 2014-04-13: 60 mg via ORAL
  Filled 2014-04-12: qty 1

## 2014-04-12 MED ORDER — METHYLPREDNISOLONE SODIUM SUCC 125 MG IJ SOLR
125.0000 mg | Freq: Once | INTRAMUSCULAR | Status: AC
  Administered 2014-04-12: 125 mg via INTRAVENOUS
  Filled 2014-04-12: qty 2

## 2014-04-12 MED ORDER — TRAMADOL HCL 50 MG PO TABS
50.0000 mg | ORAL_TABLET | Freq: Once | ORAL | Status: AC
  Administered 2014-04-12: 50 mg via ORAL
  Filled 2014-04-12: qty 1

## 2014-04-12 MED ORDER — LEVOTHYROXINE SODIUM 25 MCG PO TABS
25.0000 ug | ORAL_TABLET | ORAL | Status: DC
  Administered 2014-04-13: 25 ug via ORAL
  Filled 2014-04-12: qty 1

## 2014-04-12 MED ORDER — ATORVASTATIN CALCIUM 10 MG PO TABS
10.0000 mg | ORAL_TABLET | Freq: Every day | ORAL | Status: DC
  Administered 2014-04-12 – 2014-04-13 (×2): 10 mg via ORAL
  Filled 2014-04-12 (×2): qty 1

## 2014-04-12 MED ORDER — NEBIVOLOL HCL 10 MG PO TABS
10.0000 mg | ORAL_TABLET | Freq: Every morning | ORAL | Status: DC
  Administered 2014-04-12 – 2014-04-13 (×2): 10 mg via ORAL
  Filled 2014-04-12 (×4): qty 1

## 2014-04-12 MED ORDER — TERAZOSIN HCL 5 MG PO CAPS
5.0000 mg | ORAL_CAPSULE | Freq: Every day | ORAL | Status: DC
  Administered 2014-04-12: 5 mg via ORAL
  Filled 2014-04-12: qty 1

## 2014-04-12 MED ORDER — WARFARIN SODIUM 2 MG PO TABS
2.0000 mg | ORAL_TABLET | Freq: Once | ORAL | Status: AC
  Administered 2014-04-12: 2 mg via ORAL
  Filled 2014-04-12: qty 1

## 2014-04-12 MED ORDER — MORPHINE SULFATE 4 MG/ML IJ SOLN: 4.0000 mg | Freq: Once | INTRAMUSCULAR | Status: AC

## 2014-04-12 MED ORDER — DILTIAZEM HCL ER COATED BEADS 180 MG PO CP24
180.0000 mg | ORAL_CAPSULE | Freq: Every day | ORAL | Status: DC
  Administered 2014-04-12: 180 mg via ORAL
  Filled 2014-04-12: qty 1

## 2014-04-12 MED ORDER — LOSARTAN POTASSIUM 50 MG PO TABS
50.0000 mg | ORAL_TABLET | Freq: Every day | ORAL | Status: DC
  Administered 2014-04-12 – 2014-04-13 (×2): 50 mg via ORAL
  Filled 2014-04-12 (×2): qty 1

## 2014-04-12 MED ORDER — INSULIN ASPART 100 UNIT/ML ~~LOC~~ SOLN
0.0000 [IU] | Freq: Three times a day (TID) | SUBCUTANEOUS | Status: DC
  Administered 2014-04-12: 7 [IU] via SUBCUTANEOUS
  Administered 2014-04-13: 3 [IU] via SUBCUTANEOUS
  Administered 2014-04-13 (×2): 5 [IU] via SUBCUTANEOUS

## 2014-04-12 MED ORDER — DIGOXIN 125 MCG PO TABS
125.0000 ug | ORAL_TABLET | Freq: Every day | ORAL | Status: DC
  Administered 2014-04-12 – 2014-04-13 (×2): 125 ug via ORAL
  Filled 2014-04-12 (×2): qty 1

## 2014-04-12 MED ORDER — PANTOPRAZOLE SODIUM 40 MG PO TBEC
40.0000 mg | DELAYED_RELEASE_TABLET | Freq: Every day | ORAL | Status: DC
Start: 2014-04-12 — End: 2014-04-13
  Administered 2014-04-12 – 2014-04-13 (×2): 40 mg via ORAL
  Filled 2014-04-12 (×2): qty 1

## 2014-04-12 MED ORDER — INSULIN ASPART 100 UNIT/ML ~~LOC~~ SOLN: 0.0000 [IU] | Freq: Every day | SUBCUTANEOUS | Status: DC

## 2014-04-12 MED ORDER — MORPHINE SULFATE 4 MG/ML IJ SOLN: 4.0000 mg | Freq: Once | INTRAMUSCULAR | Status: DC

## 2014-04-12 MED ORDER — WARFARIN - PHARMACIST DOSING INPATIENT
Status: DC
  Administered 2014-04-12 – 2014-04-13 (×2)

## 2014-04-12 MED ORDER — FUROSEMIDE 20 MG PO TABS
20.0000 mg | ORAL_TABLET | Freq: Every morning | ORAL | Status: DC
  Administered 2014-04-12 – 2014-04-13 (×2): 20 mg via ORAL
  Filled 2014-04-12 (×2): qty 1

## 2014-04-12 MED ORDER — GLIPIZIDE 5 MG PO TABS
5.0000 mg | ORAL_TABLET | Freq: Every day | ORAL | Status: DC
  Administered 2014-04-13: 5 mg via ORAL
  Filled 2014-04-12: qty 1

## 2014-04-12 MED ORDER — METFORMIN HCL 500 MG PO TABS: 500.0000 mg | ORAL_TABLET | Freq: Two times a day (BID) | ORAL | Status: DC

## 2014-04-12 MED ORDER — MORPHINE SULFATE 2 MG/ML IJ SOLN
INTRAMUSCULAR | Status: AC
  Administered 2014-04-12: 4 mg via INTRAMUSCULAR
  Filled 2014-04-12: qty 2

## 2014-04-12 NOTE — Progress Notes (Signed)
ANTICOAGULATION CONSULT NOTE - Initial Consult  Pharmacy Consult for Coumadin (chronic Rx PTA) Indication: atrial fibrillation  No Known Allergies  Patient Measurements: Height: 5' (152.4 cm) Weight: 132 lb (59.875 kg) IBW/kg (Calculated) : 45.5  Vital Signs: Temp: 98.1 F (36.7 C) (09/01 0302) Temp src: Oral (09/01 0302) BP: 138/42 mmHg (09/01 1130) Pulse Rate: 63 (09/01 1130)  Labs:  Recent Labs  04/12/14 0956  HGB 11.7*  HCT 35.1*  PLT 165  LABPROT 30.5*  INR 2.92*  CREATININE 0.64   Estimated Creatinine Clearance: 37.1 ml/min (by C-G formula based on Cr of 0.64).  Medical History: Past Medical History  Diagnosis Date  . Hypertension   . Carcinoma of breast     left masectomy in 1995  . Atrial fibrillation 2007    CHF with preserved LV systolic function - 9323; moderate LVH  . Arteriosclerotic cardiovascular disease (ASCVD)     cath in 2001- 40% LAD and 50% RCA; stent for 80% LAD in 5/04; residual 50% LAD, 80% small D1, 70% small OM1 50% ostial RCA, nl EF   . MVP (mitral valve prolapse)     moderate; with moderate MR  . PVD (peripheral vascular disease)     ABIs of 0.64 and 0.59, right and left leg in 2009  . CVD (cerebrovascular disease)     plaque w/o focal disease in 2006; h/o CVA  . Diabetes mellitus type II     no insulin   . Hypothyroid   . Osteoporosis   . Edema   . Herpes zoster   . Chronic hoarseness   . Bronchitis     history  . Varicose veins of legs 08/20/2011  . Lower extremity edema 08/20/2011  . Chronic diastolic heart failure 5/57/3220  . 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 Shingles   . Ulcer     left lower leg  . Peripheral neuropathy    Medications:  Prescriptions prior to admission  Medication Sig Dispense Refill  . Acetaminophen 500 MG coapsule Take 250 mg by mouth daily as needed for fever or pain.      . beta carotene w/minerals (OCUVITE) tablet Take 1 tablet by mouth daily.        . Calcium Carbonate-Vitamin D (CALCARB 600/D PO)  Take 1 tablet by mouth at bedtime.      . digoxin (LANOXIN) 0.125 MG tablet Take 125 mcg by mouth daily. Takes after lunch      . diltiazem (DILACOR XR) 180 MG 24 hr capsule Take 180 mg by mouth at bedtime.      . furosemide (LASIX) 20 MG tablet Take 20 mg by mouth every morning.       Marland Kitchen glipiZIDE (GLUCOTROL) 5 MG tablet Take 5 mg by mouth every morning.       Marland Kitchen levothyroxine (SYNTHROID, LEVOTHROID) 25 MCG tablet Take 25 mcg by mouth every morning.       Marland Kitchen losartan (COZAAR) 50 MG tablet Take 50 mg by mouth daily. Daily after lunch      . metFORMIN (GLUCOPHAGE) 500 MG tablet Take 500 mg by mouth 2 (two) times daily with a meal.        . Multiple Vitamins-Minerals (CENTRUM) tablet Take 1 tablet by mouth daily.      . nebivolol (BYSTOLIC) 10 MG tablet Take 10 mg by mouth every morning.       Marland Kitchen omeprazole (PRILOSEC) 20 MG capsule Take 20 mg by mouth 2 (two) times daily.        Marland Kitchen  potassium chloride SA (K-DUR,KLOR-CON) 20 MEQ tablet Take 20 mEq by mouth every morning.       . raloxifene (EVISTA) 60 MG tablet Take 60 mg by mouth every morning.      . rosuvastatin (CRESTOR) 10 MG tablet Take 10 mg by mouth daily.      Marland Kitchen terazosin (HYTRIN) 5 MG capsule Take 5 mg by mouth at bedtime.      Marland Kitchen warfarin (COUMADIN) 2 MG tablet Take 2-3 mg by mouth at bedtime. Takes one tablet daily on Sun, Tues, Wed, Fri and Sat.  Takes one and one-half tablet on Mon and Thurs.        Assessment: 80yo female on chronic Coumadin PTA.  Home dose is listed above.  INR is therapeutic on admission.  Pt has h/o afib.    Goal of Therapy:  INR 2-3 Monitor platelets by anticoagulation protocol: Yes   Plan:  Coumadin 2mg  po today x 1 (home regimen) INR daily for now  Hart Robinsons A 04/12/2014,1:04 PM

## 2014-04-12 NOTE — ED Notes (Addendum)
With some difficulty and needing one assist pt was able to get to bathroom using a walker. States her pain was a 10 while up and is subsiding now that she's back lying on the stretcher.

## 2014-04-12 NOTE — H&P (Signed)
[facility] History and Physical  [name] YCX:[id number] DOB: 06-19-1921 DOA: 04/12/2014  Referring physician:  PCP: Delphina Cahill, MD   Chief Complaint: right hip pain/low back pain  HPI: [name] is a 80 y.o. female history that includes diabetes, hypertension, A. Fib, chronic diastolic heart failure, peripheral vascular disease, hypothyroidism presents to the emergency department chief complaint of right hip pain and low back pain. She states that she has not suffered any recent falls or trauma. She states that 2 days ago when she was walking to the bathroom she felt a "pop" in the back of her right knee. Since that time she's developed worsening pain with ambulation. The pain has spread to her lateral thigh and up into her hip and low back on the right side. She denies any numbness or tingling of her lower extremities. She denies any incontinence of bowel or bladder. She does indicate that she had an injection in her left knee about a week ago and got good relief for about one day and then the pain in the left knee came back. She saw Dr. [name] at that time.  She denies any fever chills chest pain abdominal pain nausea vomiting. She denies any dysuria hematuria frequency or urgency. She does complain of some abdominal distention but reports several loose stools yesterday.  Initial evaluation in the emergency department reveals trochanteric bursitis of her right hip and anterior labral tear via MRI. She is provided with Solu-Medrol and pain medicine in the emergency department. She is still unable to bear weight and we are consulted for admission. Basic metabolic panel unremarkable, complete blood count unremarkable. INR 2.92, EKG with A. fib at a rate of 55. She is hemodynamically stable and not hypoxic.  Review of Systems:  10 point review of systems completed and all systems are negative except as indicated in the history of present illness   Past Medical History  Diagnosis  Date  . Hypertension   . Carcinoma of breast     left masectomy in 1995  . Atrial fibrillation 2007    CHF with preserved LV systolic function - 4970; moderate LVH  . Arteriosclerotic cardiovascular disease (ASCVD)     cath in 2001- 40% LAD and 50% RCA; stent for 80% LAD in 5/04; residual 50% LAD, 80% small D1, 70% small OM1 50% ostial RCA, nl EF   . MVP (mitral valve prolapse)     moderate; with moderate MR  . PVD (peripheral vascular disease)     ABIs of 0.64 and 0.59, right and left leg in 2009  . CVD (cerebrovascular disease)     plaque w/o focal disease in 2006; h/o CVA  . Diabetes mellitus type II     no insulin   . Hypothyroid   . Osteoporosis   . Edema   . Herpes zoster   . Chronic hoarseness   . Bronchitis     history  . Varicose veins of legs 08/20/2011  . Lower extremity edema 08/20/2011  . Chronic diastolic heart failure 2/63/7858  . 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 Shingles   . Ulcer     left lower leg  . Peripheral neuropathy    Past Surgical History  Procedure Laterality Date  . Orif of left hip  05/22/06    [name]  . Left masectomy  1995  . Cholecystectomy  2006  . Abdominal hysterectomy      abd?  . Cataract extraction, bilateral    . Colonoscopy  Date unknown  . Coronary angioplasty with stent placement     Social History:  reports that she has never smoked. She has never used smokeless tobacco. She reports that she does not drink alcohol or use illicit drugs. He lives alone she ambulates with a walker she has an aide 3 days a week for half a day. No Known Allergies  Family History  Problem Relation Age of Onset  . Diabetes Mother     Family medical history reviewed and is noncontributory to the admission of this elderly lady Prior to Admission medications   Medication Sig Start Date End Date Taking? Authorizing Provider  Acetaminophen 500 MG coapsule Take 250 mg by mouth daily as needed for fever or pain.   Yes Historical Provider, MD  beta carotene  w/minerals (OCUVITE) tablet Take 1 tablet by mouth daily.     Yes Historical Provider, MD  Calcium Carbonate-Vitamin D (CALCARB 600/D PO) Take 1 tablet by mouth at bedtime.   Yes Historical Provider, MD  digoxin (LANOXIN) 0.125 MG tablet Take 125 mcg by mouth daily. Takes after lunch   Yes Historical Provider, MD  diltiazem (DILACOR XR) 180 MG 24 hr capsule Take 180 mg by mouth at bedtime.   Yes Historical Provider, MD  furosemide (LASIX) 20 MG tablet Take 20 mg by mouth every morning.    Yes Historical Provider, MD  glipiZIDE (GLUCOTROL) 5 MG tablet Take 5 mg by mouth every morning.    Yes Historical Provider, MD  levothyroxine (SYNTHROID, LEVOTHROID) 25 MCG tablet Take 25 mcg by mouth every morning.    Yes Historical Provider, MD  losartan (COZAAR) 50 MG tablet Take 50 mg by mouth daily. Daily after lunch   Yes Historical Provider, MD  metFORMIN (GLUCOPHAGE) 500 MG tablet Take 500 mg by mouth 2 (two) times daily with a meal.     Yes Historical Provider, MD  Multiple Vitamins-Minerals (CENTRUM) tablet Take 1 tablet by mouth daily.   Yes Historical Provider, MD  nebivolol (BYSTOLIC) 10 MG tablet Take 10 mg by mouth every morning.    Yes Historical Provider, MD  omeprazole (PRILOSEC) 20 MG capsule Take 20 mg by mouth 2 (two) times daily.     Yes Historical Provider, MD  potassium chloride SA (K-DUR,KLOR-CON) 20 MEQ tablet Take 20 mEq by mouth every morning.    Yes Historical Provider, MD  raloxifene (EVISTA) 60 MG tablet Take 60 mg by mouth every morning.   Yes Historical Provider, MD  rosuvastatin (CRESTOR) 10 MG tablet Take 10 mg by mouth daily.   Yes Historical Provider, MD  terazosin (HYTRIN) 5 MG capsule Take 5 mg by mouth at bedtime.   Yes Historical Provider, MD  warfarin (COUMADIN) 2 MG tablet Take 2-3 mg by mouth at bedtime. Takes one tablet daily on Sun, Tues, Wed, Fri and Sat.  Takes one and one-half tablet on Mon and Thurs.   Yes Historical Provider, MD   Physical Exam: Filed Vitals:    04/12/14 1100 04/12/14 1110 04/12/14 1115 04/12/14 1130  BP:  132/68  138/42  Pulse:   76 63  Temp:      TempSrc:      Resp: 20 16 13 18   Height:      Weight:      SpO2:  95% 96% 94%    Wt Readings from Last 3 Encounters:  04/12/14 59.875 kg (132 lb)  04/04/14 59.875 kg (132 lb)  03/21/14 59.875 kg (132 lb)    General:  Appears  calm and comfortable Eyes: PERRL, normal lids, irises & conjunctiva ENT: grossly normal hearing, because membranes of her mouth are slightly pale but Neck: no LAD, masses or thyromegaly Cardiovascular: Irregularly irregular. no gallop no rub. Bilateral lower extremity edema with left greater than right trace to 1+  Respiratory: CTA bilaterally, no w/r/r. Normal respiratory effort. Hear no wheeze no rhonchi Abdomen: soft, somewhat distended positive bowel sounds nontender to palpation no mass organomegaly noted Skin: no rash or induration seen on limited exam. Lower extremities with chronic venous stasis changes Musculoskeletal: Joints without swelling/erythema. Left knee somewhat tender to palpation with full range of motion. Right knee and hip with good flexion and extension. Range of motion to the hip minimally decreased do to pain on flexion. Mild tenderness lateral aspect of right thigh and right lumbar spine Psychiatric: grossly normal mood and affect, speech fluent and appropriate Neurologic: grossly non-focal.          Labs on Admission:  Basic Metabolic Panel:  Recent Labs Lab 04/12/14 0956  NA 141  K 4.2  CL 108  CO2 26  GLUCOSE 88  BUN 30*  CREATININE 0.64  CALCIUM 8.4   Liver Function Tests: No results found for this basename: AST, ALT, ALKPHOS, BILITOT, PROT, ALBUMIN,  in the last 168 hours No results found for this basename: LIPASE, AMYLASE,  in the last 168 hours No results found for this basename: AMMONIA,  in the last 168 hours CBC:  Recent Labs Lab 04/12/14 0956  WBC 5.8  NEUTROABS 3.5  HGB 11.7*  HCT 35.1*  MCV 90.2   PLT 165   Cardiac Enzymes: No results found for this basename: CKTOTAL, CKMB, CKMBINDEX, TROPONINI,  in the last 168 hours  BNP (last 3 results) No results found for this basename: PROBNP,  in the last 8760 hours CBG: No results found for this basename: GLUCAP,  in the last 168 hours  Radiological Exams on Admission: Dg Lumbar Spine Complete  04/12/2014   CLINICAL DATA:  Back pain  EXAM: LUMBAR SPINE - COMPLETE 4+ VIEW  COMPARISON:  03/04/2014  FINDINGS: Osteopenia and multilevel degenerative changes. Grade 1 anterolisthesis of L4 on L5 and of L5 on S1, similar to prior. Left L5 pars defect. Mild superior endplate height loss at L4, similar to prior. No definite acute fracture or dislocation. Sacrum obscured by overlying bowel. Atherosclerotic vascular calcifications. Surgical clips project over the right upper quadrant  IMPRESSION: Osteopenia and multilevel degenerative changes. No acute osseous finding.   Electronically Signed   By: Carlos Levering M.D.   On: 04/12/2014 04:26   Dg Hip Complete Right  04/12/2014   CLINICAL DATA:  BACK PAIN, right hip pain. Right hip pops when walking.  EXAM: RIGHT HIP - COMPLETE 2+ VIEW  COMPARISON:  03/04/2014 contralateral hip radiographs  FINDINGS: Osteopenia. No displaced acute fracture or dislocation. Mild right hip DJD. Atherosclerotic vascular calcifications. Left femur rod and screw hardware again noted.  IMPRESSION: Mild right hip DJD.  No acute osseous finding.  Recommend MRI if concern for radiographically occult pathology persists.   Electronically Signed   By: Carlos Levering M.D.   On: 04/12/2014 04:21   Mr Hip Right Wo Contrast  04/12/2014   CLINICAL DATA:  Right hip pain for several days. History of breast cancer. Query occult fracture.  EXAM: MR OF THE RIGHT HIP WITHOUT CONTRAST  TECHNIQUE: Multiplanar, multisequence MR imaging was performed. No intravenous contrast was administered.  COMPARISON:  Radiographs from 04/12/2014  FINDINGS: Bones: No  right hip fracture. No definite signs of left periprosthetic hip fracture although metal artifact obscures bony structures immediately adjacent to the left hip screw.  Articular cartilage and labrum  Articular cartilage:  Grossly intact  Labrum:  Anterior superior labral tear  Joint or bursal effusion  Joint effusion:  Absent  Bursae:  Mild trochanteric bursitis and adjacent subcutaneous edema.  Muscles and tendons  Muscles and tendons: Torn iliotibial band on the right, image 15 series 2, with surrounding edema.  Other findings  Miscellaneous:  Relatively atrophic gluteus medius and gluteus minimus bilaterally. Incidental lower lumbar spondylosis and degenerative disc disease.  IMPRESSION: 1. Acutely torn right iliotibial band with adjacent trochanteric bursitis and subcutaneous edema. 2. Anterior superior labral tear on the right.   Electronically Signed   By: Sherryl Barters M.D.   On: 04/12/2014 08:09    EKG: Independently reviewed. A. fib  Assessment/Plan Principal Problem:   Hip pain: Likely related to trochanteric bursitis and labral tear per MRI. Surgery of right hip shows degenerative disease without fracture. Will admit for observation to medical floor. She received 125 mg of Solu-Medrol and 4 mg of morphine and 50 mg of Ultram in the emergency department and was quite comfortable at the time of my exam. Will consult orthopedic surgeon regarding steroid treatment and other recommendations for management. Active Problems:   Labral tear of hip, degenerative/ Trochanteric bursitis of left hip: No report of injury or fall or trauma. See #1. Will request physical therapy evaluation as well.  Atrial fibrillation: Rate controlled. Chart review indicates she saw Dr. Harrington Challenger with cardiology in June of this year. Will continue Coumadin per pharmacy.. INR currently 2.9. Continue home dig, diltiazem. Check a TSH.    Hypothyroid: Check a TSH. Continue home Synthroid    Lower extremity edema: Likely  related to chronic diastolic heart failure. Currently at baseline. Will continue home Lasix. Will monitor intake and output. Obtain daily weights.      DIABETES MELLITUS, TYPE II: She is on oral agents. I will continue these. Obtain a hemoglobin A1c. Also use sliding scale insulin as indicated for optimal control. Provide car modified diet    Hyperlipidemia: Continue home meds. Obtain a lipid panel    Hypertension: Controlled. Continue home medications    Chronic anticoagulation: Takes Coumadin for A. fib. INR 2.9 on admission. Will order Coumadin per pharmacy    Arteriosclerotic cardiovascular disease (ASCVD): Stable at baseline. No chest pain. EKG without acute changes. Continue home medication  Chronic diastolic heart failure related to atrial fibrillation. Continue Lasix. Continue beta blocker. Monitor intake and output and obtain daily weights.      Dr. Althia Forts office called. Patient prefers Dr [name] as she has seen him recently  Code Status: DNR DVT Prophylaxis: Family Communication: son at bedside Disposition Plan: home when ready  Time spent: 1 minutes  [facility] Pager [phone]  **Disclaimer: This note may have been dictated with voice recognition software. Similar sounding words can inadvertently be transcribed and this note may contain transcription errors which may not have been corrected upon publication of note.**

## 2014-04-12 NOTE — Telephone Encounter (Signed)
Received call from [place], NP, states she is admitting patient "not for knee problem", however, requesting consult, said that patient is requesting Dr [name] to see her, since she had just seen him here in the office (04/04/14) for her knee.  Pager ph# is [phone].

## 2014-04-12 NOTE — ED Notes (Signed)
Onset of right low back/hip pain started 2 days ago, feels it pop. Tonight got up to bathroom, "took along time to get into the bathroom" then it hurt to sit. Used her lifeline to call for help. States pain is 8/10 when she tries to walk. No pain at rest now on the stretcher.

## 2014-04-12 NOTE — H&P (Signed)
Patient seen and examined . Agree with NP's admission H&P and plan.  Right hip bursitis/ muscle tear patient has tenderness to pressure on right lateral thigh and gluteus. Normal knee flexion and ROM of hip. Plan no pain control and PT. Further recommendations per orthopedics.  Chronic diastolic HF Appears euvolemic. Seems to have chr trace leg edema. continue digoxin and lasix.   DM Hold metformin. On SSI.   afib Rate controlled. continue Cardizem and coumadin. INR therapeutic

## 2014-04-12 NOTE — ED Provider Notes (Signed)
CSN: [id number]     Arrival date & time 04/12/14  0258 History   First MD Initiated Contact with Patient 04/12/14 [phone]     Chief Complaint  Patient presents with  . Back Pain     (Consider location/radiation/quality/duration/timing/severity/associated sxs/prior Treatment) HPI Comments: Patient is a 80 year old female with history of hypertension, atrial fibrillation, diabetes. She presents for evaluation of pain in her right hip. She denies any injury or trauma. Has been hurting for the past 2 days she believes she can feel a click when she walks. This evening, she got up to go to the bathroom and her pain significantly worsened. She had her medical alert button and was brought here by EMTs to be evaluated. She denies any weakness or numbness in her leg. Her pain is worsened with movement and palpation and relieved with rest.  Patient is a 80 y.o. female presenting with back pain. The history is provided by the patient.  Back Pain Location:  Lumbar spine Quality:  Stabbing Radiates to: Right hip. Pain severity:  Moderate Pain is:  Same all the time Onset quality:  Sudden Duration:  2 days Timing:  Constant Progression:  Worsening Chronicity:  New   Past Medical History  Diagnosis Date  . Hypertension   . Carcinoma of breast     left masectomy in 1995  . Atrial fibrillation 2007    CHF with preserved LV systolic function - 8938; moderate LVH  . Arteriosclerotic cardiovascular disease (ASCVD)     cath in 2001- 40% LAD and 50% RCA; stent for 80% LAD in 5/04; residual 50% LAD, 80% small D1, 70% small OM1 50% ostial RCA, nl EF   . MVP (mitral valve prolapse)     moderate; with moderate MR  . PVD (peripheral vascular disease)     ABIs of 0.64 and 0.59, right and left leg in 2009  . CVD (cerebrovascular disease)     plaque w/o focal disease in 2006; h/o CVA  . Diabetes mellitus type II     no insulin   . Hypothyroid   . Osteoporosis   . Edema   . Herpes zoster   . Chronic  hoarseness   . Bronchitis     history  . Varicose veins of legs 08/20/2011  . Lower extremity edema 08/20/2011  . Chronic diastolic heart failure 08/12/7508  . 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 Shingles   . Ulcer     left lower leg  . Peripheral neuropathy    Past Surgical History  Procedure Laterality Date  . Orif of left hip  05/22/06    [name]  . Left masectomy  1995  . Cholecystectomy  2006  . Abdominal hysterectomy      abd?  . Cataract extraction, bilateral    . Colonoscopy  Date unknown  . Coronary angioplasty with stent placement     Family History  Problem Relation Age of Onset  . Diabetes Mother    History  Substance Use Topics  . Smoking status: Never Smoker   . Smokeless tobacco: Never Used     Comment: tobacco use - no   . Alcohol Use: No   OB History   Grav Para Term Preterm Abortions TAB SAB Ect Mult Living                 Review of Systems  Musculoskeletal: Positive for back pain.  All other systems reviewed and are negative.     Allergies  Review of patient's  allergies indicates no known allergies.  Home Medications   Prior to Admission medications   Medication Sig Start Date End Date Taking? Authorizing Provider  digoxin (LANOXIN) 0.125 MG tablet Take 125 mcg by mouth daily. Takes after lunch   Yes Historical Provider, MD  diltiazem (DILACOR XR) 180 MG 24 hr capsule Take 180 mg by mouth at bedtime.   Yes Historical Provider, MD  furosemide (LASIX) 20 MG tablet Take 20 mg by mouth every morning.    Yes Historical Provider, MD  glipiZIDE (GLUCOTROL) 5 MG tablet Take 5 mg by mouth every morning.    Yes Historical Provider, MD  levothyroxine (SYNTHROID, LEVOTHROID) 25 MCG tablet Take 25 mcg by mouth every morning.    Yes Historical Provider, MD  losartan (COZAAR) 50 MG tablet Take 50 mg by mouth daily. Daily after lunch   Yes Historical Provider, MD  metFORMIN (GLUCOPHAGE) 500 MG tablet Take 500 mg by mouth 2 (two) times daily with a meal.     Yes  Historical Provider, MD  Multiple Vitamins-Minerals (CENTRUM) tablet Take 1 tablet by mouth daily.   Yes Historical Provider, MD  nebivolol (BYSTOLIC) 10 MG tablet Take 10 mg by mouth every morning.    Yes Historical Provider, MD  omeprazole (PRILOSEC) 20 MG capsule Take 20 mg by mouth 2 (two) times daily.     Yes Historical Provider, MD  potassium chloride SA (K-DUR,KLOR-CON) 20 MEQ tablet Take 20 mEq by mouth every morning.    Yes Historical Provider, MD  raloxifene (EVISTA) 60 MG tablet Take 60 mg by mouth every morning.   Yes Historical Provider, MD  terazosin (HYTRIN) 5 MG capsule Take 5 mg by mouth at bedtime.   Yes Historical Provider, MD  warfarin (COUMADIN) 2 MG tablet Take 2-3 mg by mouth as directed. Takes one tablet daily except takes one and one-half tablet on Sundays **Takes at bedtime**   Yes Historical Provider, MD  Acetaminophen 500 MG coapsule Take 250 mg by mouth daily as needed for fever or pain.    Historical Provider, MD  acetaminophen-codeine (TYLENOL #3) 300-30 MG per tablet Take 1 tablet by mouth every 6 (six) hours as needed for moderate pain. 04/04/14   Carole Civil, MD  beta carotene w/minerals (OCUVITE) tablet Take 1 tablet by mouth daily.      Historical Provider, MD  Calcium Carbonate-Vitamin D (CALCARB 600/D PO) Take 1 tablet by mouth at bedtime.    Historical Provider, MD  naproxen (NAPROSYN) 250 MG tablet Take 1 tablet (250 mg total) by mouth 2 (two) times daily with a meal. 03/21/14   Nat Christen, MD  predniSONE (DELTASONE) 10 MG tablet Take 2 tablets (20 mg total) by mouth daily. 03/21/14   Nat Christen, MD   BP 172/85  Pulse 95  Temp(Src) 98.1 F (36.7 C) (Oral)  Resp 20  Ht 5' (1.524 m)  Wt 132 lb (59.875 kg)  BMI 25.78 kg/m2  SpO2 98% Physical Exam  Nursing note and vitals reviewed. Constitutional: She is oriented to person, place, and time. She appears well-developed and well-nourished. No distress.  HENT:  Head: Normocephalic and atraumatic.   Neck: Normal range of motion. Neck supple.  Musculoskeletal:  There is tenderness to palpation in the posterior aspect of the right hip. There is no gross deformity or obvious abnormality. DP and PT pulses are easily palpable. Strength, sensation are intact to the distal right leg.  Neurological: She is alert and oriented to person, place, and time.  Skin:  Skin is warm and dry. She is not diaphoretic.    ED Course  Procedures (including critical care time) Labs Review Labs Reviewed - No data to display  Imaging Review No results found.   EKG Interpretation None      MDM   Final diagnoses:  None    Patient presents with complaints of pain in the back of her right hip that started 2 days ago. Substantially worsened this evening when she attempted to ambulate to the bathroom. She used her medic alert necklace to summon 911 and was transported here for further evaluation. There is no injury or trauma and x-rays of the hip reveal no evidence for fracture. She is given pain medication, however is not feeling any better. As she is unable to ambulate, she is not going to be able to return to her home as she lives alone. She will undergo an MRI of her hip to rule out the possibility of fracture or other soft tissue injury. Care will be signed out to Dr. Roderic Palau at shift change. He will obtain the results of the MRI and determine the final disposition.    Veryl Speak, MD 04/12/14 [phone]

## 2014-04-13 DIAGNOSIS — M25559 Pain in unspecified hip: Secondary | ICD-10-CM | POA: Diagnosis not present

## 2014-04-13 DIAGNOSIS — R140 Abdominal distension (gaseous): Secondary | ICD-10-CM | POA: Diagnosis present

## 2014-04-13 LAB — GLUCOSE, CAPILLARY
GLUCOSE-CAPILLARY: 214 mg/dL — AB (ref 70–99)
Glucose-Capillary: 252 mg/dL — ABNORMAL HIGH (ref 70–99)
Glucose-Capillary: 286 mg/dL — ABNORMAL HIGH (ref 70–99)

## 2014-04-13 LAB — PROTIME-INR
INR: 2.8 — ABNORMAL HIGH (ref 0.00–1.49)
PROTHROMBIN TIME: 29.5 s — AB (ref 11.6–15.2)

## 2014-04-13 MED ORDER — PREDNISONE 20 MG PO TABS
60.0000 mg | ORAL_TABLET | Freq: Every day | ORAL | Status: DC
  Administered 2014-04-13: 60 mg via ORAL
  Filled 2014-04-13: qty 3

## 2014-04-13 MED ORDER — PREDNISONE 20 MG PO TABS: 60.0000 mg | ORAL_TABLET | Freq: Every day | ORAL | Status: AC

## 2014-04-13 MED ORDER — PSYLLIUM 95 % PO PACK
1.0000 | PACK | Freq: Every day | ORAL | Status: DC
  Administered 2014-04-13: 1 via ORAL
  Filled 2014-04-13 (×3): qty 1

## 2014-04-13 MED ORDER — PSYLLIUM 95 % PO PACK: 1.0000 | PACK | Freq: Every day | ORAL | Status: DC

## 2014-04-13 MED ORDER — WARFARIN SODIUM 2 MG PO TABS
2.0000 mg | ORAL_TABLET | Freq: Once | ORAL | Status: AC
  Administered 2014-04-13: 2 mg via ORAL
  Filled 2014-04-13: qty 1

## 2014-04-13 MED ORDER — TRAMADOL-ACETAMINOPHEN 37.5-325 MG PO TABS: 1.0000 | ORAL_TABLET | ORAL | Status: DC | PRN

## 2014-04-13 NOTE — Care Management Note (Addendum)
    Page 1 of 2   04/13/2014     4:14:17 PM CARE MANAGEMENT NOTE 04/13/2014  Patient:  [name], [name]   Account Number:  [id number]  Date Initiated:  04/13/2014  Documentation initiated by:  Jolene Provost  Subjective/Objective Assessment:   Pt is from home, lives alone. Pt has an aid that comes out 6 days a week through the [facility] and [place]. Pt has a walker that she uses regularly.     Action/Plan:   Pt plans to discharge home with self care. Pt will have [place] PT at discharge. Romualdo Bolk, of [facility] per pt's choice, has been notified of referral and will obtain pt's information from chart. Pt has no further CM needs at this time.   Anticipated DC Date:  04/13/2014   Anticipated DC Plan:  [place]  CM consult      Choice offered to / List presented to:  C-1 Patient   DME arranged  BEDSIDE COMMODE        [place] arranged  HH-2 PT  HH-1 RN  [place].   Status of service:  Completed, signed off Medicare Important Message given?   (If response is "NO", the following Medicare IM given date fields will be blank) Date Medicare IM given:   Medicare IM given by:   Date Additional Medicare IM given:   Additional Medicare IM given by:    Discharge Disposition:  [place]  Per UR Regulation:    If discussed at Long Length of Stay Meetings, dates discussed:    Comments:  04/13/2014 [place], RN, MSN, PCCN Pt's visitor voiced concerns that pt "should not be discharged" When disccusion had with Pt, she voiced concerns that she is scared to go home alone and is "too tired to go home today anyway" MD, NP and CSW in room for conversation with pt. After much discussion pt will have RN and CSW added to [facility] services and pt will have BSC delivered to home. Romualdo Bolk of [facility] notified of both [facility] addition and DME need. Plan continues for pt to discharge home  with [facility] services today.  04/13/2014 [place], RN, MSN, [facility]

## 2014-04-13 NOTE — Evaluation (Signed)
Physical Therapy Evaluation Patient Details Name: [name] MRN: [id number] DOB: [birth year] Today's Date: 04/13/2014   History of Present Illness  [name] is a 80 y.o. female history that includes diabetes, hypertension, A. Fib, chronic diastolic heart failure, peripheral vascular disease, hypothyroidism presents to the emergency department chief complaint of right hip pain and low back pain. She states that she has not suffered any recent falls or trauma. She states that 2 days ago when she was walking to the bathroom she felt a "pop" in the back of her right knee. Since that time she's developed worsening pain with ambulation. The pain has spread to her lateral thigh and up into her hip and low back on the right side. She denies any numbness or tingling of her lower extremities. She denies any incontinence of bowel or bladder. She does indicate that she had an injection in her left knee about a week ago and got good relief for about one day and then the pain in the left knee came back. She saw Dr. [name] at that time.  Initial evaluation in the emergency department reveals trochanteric bursitis of her right hip and anterior labral tear via MRI. She is provided with Solu-Medrol and pain medicine in the emergency department. She is still unable to bear weight and we are consulted for admission.  Clinical Impression  Pt is a 80 year old female who presents to PT with dx of hip pain.  MRI revelas Rt trochanteric bursitis and anterior labral tear.  Pt reports recent hx of Lt knee pain and received injection from MD [name] on 04/05/14, though pain still continues during WB activities.  During evaluation, pt was mod (I) with bed mobility skills, supervision during transfers, and supervision/min guard and use of RW during gait for 60 feet.  After ~30 feet pt reported onset of Lt knee/leg pain, and at ~50 feet pt reported onset of Rt hip pain.  Gait distance limited secondary to pain in Lt > Rt LE.   Recommend continued PT to address strengthening, activity tolerance, and pain management for improved functional mobility skills with transition to HHPT at discharge.  Pt may benefit from 3 in 1 commode, as gait distance may be limited at home secondary to pain, though pt reports she did not want one because she did not want to have to clean it out.      Follow Up Recommendations Home health PT    Equipment Recommendations    None recommended.  (Advised pt she may benefit from 3-in-1 commode, though pt reported she did not want to have to clean one out and would prefer to use the bathroom).       Precautions / Restrictions Precautions Precautions: Fall Restrictions Weight Bearing Restrictions: No      Mobility  Bed Mobility Overal bed mobility: Modified Independent                Transfers Overall transfer level: Needs assistance Equipment used: Rolling walker (2 wheeled) Transfers: Sit to/from Stand Sit to Stand: Supervision            Ambulation/Gait Ambulation/Gait assistance: Supervision;Min guard Ambulation Distance (Feet): 60 Feet Assistive device: Rolling walker (2 wheeled) Gait Pattern/deviations: Trunk flexed;Step-through pattern;Decreased dorsiflexion - right;Decreased dorsiflexion - left   Gait velocity interpretation: Below normal speed for age/gender General Gait Details: Increasing complaints of Lt knee pain limited ambulation distance     Balance Overall balance assessment: Needs assistance Sitting-balance support: Feet supported;No upper extremity supported  Sitting balance-Leahy Scale: Good     Standing balance support: Bilateral upper extremity supported;During functional activity Standing balance-Leahy Scale: Fair                               Pertinent Vitals/Pain Pain Assessment: Faces Faces Pain Scale: Hurts little more (During gait) Pain Location: Lt knee Pain Intervention(s): Limited activity within patient's  tolerance;Repositioned    Home Living Family/patient expects to be discharged to:: Private residence Living Arrangements: Alone Available Help at Discharge: Personal care attendant;Family;Available PRN/intermittently (Aide M,T,R 3 hours a day, son lives in [place]) Type of Home: House Home Access: Stairs to enter Entrance Stairs-Rails: Left Entrance Stairs-Number of Steps: 6 Home Layout: One level Home Equipment: Shower seat;Grab bars - toilet;Grab bars - tub/shower;Toilet riser;Walker - 2 wheels;Cane - quad Brewing technologist) Additional Comments: Tub Shower    Prior Function Level of Independence: Needs assistance;Independent with assistive device(s)   Gait / Transfers Assistance Needed: Pt reports she is mod (I) with bed mobility skills, transfers, and household ambulation with use of RW.  Pt reports she is able to amb into MD offices with RW.   ADL's / Homemaking Assistance Needed: Pt reports aide assists with light household cleaning, bathing, grooming           Extremity/Trunk Assessment               Lower Extremity Assessment: Generalized weakness         Communication   Communication: No difficulties  Cognition Arousal/Alertness: Awake/alert Behavior During Therapy: WFL for tasks assessed/performed Overall Cognitive Status: Within Functional Limits for tasks assessed                        Assessment/Plan    PT Assessment Patient needs continued PT services  PT Diagnosis Difficulty walking;Generalized weakness   PT Problem List Decreased activity tolerance;Pain;Decreased mobility  PT Treatment Interventions Gait training;Functional mobility training;Therapeutic activities;Therapeutic exercise;Stair training;Patient/family education   PT Goals (Current goals can be found in the Care Plan section) Acute Rehab PT Goals Patient Stated Goal: decrease pain in Lt knee (when walking) PT Goal Formulation: With patient Time For Goal Achievement:  04/27/14 Potential to Achieve Goals: Fair    Frequency Min 3X/week    End of Session Equipment Utilized During Treatment: Gait belt Activity Tolerance: Patient limited by pain Patient left: in bed;with call bell/phone within reach;with bed alarm set      Functional Assessment Tool Used: Clinical Judgement Functional Limitation: Mobility: Walking and moving around Mobility: Walking and Moving Around Current Status [phone]): At least 1 percent but less than 20 percent impaired, limited or restricted Mobility: Walking and Moving Around Goal Status [phone]): 0 percent impaired, limited or restricted    Time: 4665-9935 PT Time Calculation (min): 20 min   Charges:   PT Evaluation $Initial PT Evaluation Tier I: 1 Procedure     PT G Codes:   Functional Assessment Tool Used: Clinical Judgement Functional Limitation: Mobility: Walking and moving around    [facility] 04/13/2014, 11:27 AM

## 2014-04-13 NOTE — Telephone Encounter (Signed)
Per call from [place], floor 300, relays that the orthopedic consult request has been cancelled; states patient is being discharged to home today, 04/13/14.

## 2014-04-13 NOTE — Discharge Summary (Signed)
Physician Discharge Summary  ERIAN LARIVIERE YWV:[id number] DOB: 05-17-1921 DOA: 04/12/2014  PCP: Delphina Cahill, MD  Admit date: 04/12/2014 Discharge date: 04/13/2014  Time spent: 40 minutes  Recommendations for Outpatient Follow-up:  1. Follow up with Dr Nevada Crane PCP in 1-2 weeks for evaluation of trochanteric bursitis. Recommend evaluation of abdominal distention as well.   2. Home health PT for strength and endurence.   Discharge Diagnoses:  Principal Problem:   Hip pain Active Problems:   DIABETES MELLITUS, TYPE II   Hyperlipidemia   Hypertension   Chronic anticoagulation   Arteriosclerotic cardiovascular disease (ASCVD)   Atrial fibrillation   Hypothyroid   Lower extremity edema   Labral tear of hip, degenerative   Trochanteric bursitis of right hip   Abdominal distention   Discharge Condition: stable  Diet recommendation: heart healthy carb modified  Filed Weights   04/12/14 0302 04/12/14 1424 04/13/14 0600  Weight: 59.875 kg (132 lb) 63 kg (138 lb 14.2 oz) 60.782 kg (134 lb)    History of present illness:  [name] is a 80 y.o. female history that includes diabetes, hypertension, A. Fib, chronic diastolic heart failure, peripheral vascular disease, hypothyroidism presented to the emergency department chief complaint of right hip pain and low back pain. She stated that she had not suffered any recent falls or trauma. She stated that 2 days prior when she was walking to the bathroom she felt a "pop" in the back of her right knee. Since that time she developed worsening pain with ambulation. The pain spread to her lateral thigh and up into her hip and low back on the right side. She denied any numbness or tingling of her lower extremities. She denied any incontinence of bowel or bladder. She did indicate that she had an injection in her left knee about a week prior and got good relief for about one day and then the pain in the left knee came back. She saw Dr. [name] at that time.    She denied any fever chills chest pain abdominal pain nausea vomiting. She denied any dysuria hematuria frequency or urgency. She did complain of some abdominal distention.   Initial evaluation in the emergency department revealed trochanteric bursitis of her right hip and anterior labral tear via MRI. She was provided with Solu-Medrol and pain medicine in the emergency department. She was still unable to bear weight.   Basic metabolic panel unremarkable, complete blood count unremarkable. INR 2.92, EKG with A. fib at a rate of 55. She was hemodynamically stable and not hypoxic.   Hospital Course:  Hip pain: Likely related to trochanteric bursitis and labral tear per MRI. xray of right hip shows degenerative disease without fracture. Admited for observation to medical floor. She was given 124mg  solumedrol on day of admission and steroid burst will be continued. At discharge provided with 5 days prednisone 60mg  to be taken daily to complete 7 days of steroids. She was evaluated by PT and while pain with ambulation much improved at discharge [facility] PT recommended. Pain managed with Ultracet. Follow up with PCP in 1-2 weeks for evaluation of symptoms   Active Problems:  Labral tear of hip, degenerative/ Trochanteric bursitis of left hip: No report of injury or fall or trauma. See #1. HH PT arranged.  Abdominal distention: denied pain/nausea. Denies constipation/diarrhea.  Abdominal xray with diffuse gaseous bowel distention, favor adynamic ileus over distal colon obstruction. Will start miralax. Follow up with PCP    Atrial fibrillation: Rate controlled. Chart review  indicates she saw Dr. Harrington Challenger with cardiology in June of this year. INR therapeutic. Continue home regimen. Has INR check scheduled 04/28/14. Continue home dig, diltiazem. TSH within the limits of normal   Hypothyroid: TSH 1.8. Continue home Synthroid   Lower extremity edema: Likely related to chronic diastolic heart failure. Remained  at  baseline.    DIABETES MELLITUS, TYPE II: controlled.  Hemoglobin A1c 6.8.    Hyperlipidemia: continue home meds   Hypertension: Controlled.    Chronic anticoagulation: Takes Coumadin for A. fib. INR 2.9 on admission. Next INR check 04/28/14. Continue home regimen at discharge   Arteriosclerotic cardiovascular disease (ASCVD): remained stable at baseline. No chest pain. EKG without acute changes.    Chronic diastolic heart failure related to atrial fibrillation. Compensated.   Procedures:  none  Consultations:  none  Discharge Exam: Filed Vitals:   04/13/14 0600  BP: 151/55  Pulse: 77  Temp: 97.7 F (36.5 C)  Resp: 20    General: well nourished. Appears comfortable Cardiovascular: irregularly irregular i hear no gallup or rub. Trace LE edema PPP Respiratory: normal effort BS clear bilaterally no wheeze rhonchi MS: joints without swelling/erythema. Mild tenderness to right lateral thigh. Full ROM to right knee and hip  Discharge Instructions You were cared for by a hospitalist during your hospital stay. If you have any questions about your discharge medications or the care you received while you were in the hospital after you are discharged, you can call the unit and asked to speak with the hospitalist on call if the hospitalist that took care of you is not available. Once you are discharged, your primary care physician will handle any further medical issues. Please note that NO REFILLS for any discharge medications will be authorized once you are discharged, as it is imperative that you return to your primary care physician (or establish a relationship with a primary care physician if you do not have one) for your aftercare needs so that they can reassess your need for medications and monitor your lab values.     Medication List         Acetaminophen 500 MG coapsule  Take 250 mg by mouth daily as needed for fever or pain.     beta carotene w/minerals tablet  Take 1  tablet by mouth daily.     CENTRUM tablet  Take 1 tablet by mouth daily.     CALCARB 600/D PO  Take 1 tablet by mouth at bedtime.     digoxin 0.125 MG tablet  Commonly known as:  LANOXIN  Take 125 mcg by mouth daily. Takes after lunch     diltiazem 180 MG 24 hr capsule  Commonly known as:  DILACOR XR  Take 180 mg by mouth at bedtime.     furosemide 20 MG tablet  Commonly known as:  LASIX  Take 20 mg by mouth every morning.     glipiZIDE 5 MG tablet  Commonly known as:  GLUCOTROL  Take 5 mg by mouth every morning.     levothyroxine 25 MCG tablet  Commonly known as:  SYNTHROID, LEVOTHROID  Take 25 mcg by mouth every morning.     losartan 50 MG tablet  Commonly known as:  COZAAR  Take 50 mg by mouth daily. Daily after lunch     metFORMIN 500 MG tablet  Commonly known as:  GLUCOPHAGE  Take 500 mg by mouth 2 (two) times daily with a meal.     nebivolol 10 MG tablet  Commonly known as:  BYSTOLIC  Take 10 mg by mouth every morning.     omeprazole 20 MG capsule  Commonly known as:  PRILOSEC  Take 20 mg by mouth 2 (two) times daily.     potassium chloride SA 20 MEQ tablet  Commonly known as:  K-DUR,KLOR-CON  Take 20 mEq by mouth every morning.     predniSONE 20 MG tablet  Commonly known as:  DELTASONE  Take 3 tablets (60 mg total) by mouth daily before breakfast.     psyllium 95 % Pack  Commonly known as:  HYDROCIL/METAMUCIL  Take 1 packet by mouth daily.     raloxifene 60 MG tablet  Commonly known as:  EVISTA  Take 60 mg by mouth every morning.     rosuvastatin 10 MG tablet  Commonly known as:  CRESTOR  Take 10 mg by mouth daily.     terazosin 5 MG capsule  Commonly known as:  HYTRIN  Take 5 mg by mouth at bedtime.     traMADol-acetaminophen 37.5-325 MG per tablet  Commonly known as:  ULTRACET  Take 1 tablet by mouth every 4 (four) hours as needed for moderate pain.     warfarin 2 MG tablet  Commonly known as:  COUMADIN  Take 2-3 mg by mouth at  bedtime. Takes one tablet daily on Sun, Tues, Wed, Fri and Sat.  Takes one and one-half tablet on Mon and Thurs.       No Known Allergies Follow-up Information   Follow up with Delphina Cahill, MD. Schedule an appointment as soon as possible for a visit in 1 week. (for evaluation of symptoms)    Specialty:  Internal Medicine   Contact information:    [place] [address] [phone]        The results of significant diagnostics from this hospitalization (including imaging, microbiology, ancillary and laboratory) are listed below for reference.    Significant Diagnostic Studies: Dg Lumbar Spine Complete  04/12/2014   CLINICAL DATA:  Back pain  EXAM: LUMBAR SPINE - COMPLETE 4+ VIEW  COMPARISON:  03/04/2014  FINDINGS: Osteopenia and multilevel degenerative changes. Grade 1 anterolisthesis of L4 on L5 and of L5 on S1, similar to prior. Left L5 pars defect. Mild superior endplate height loss at L4, similar to prior. No definite acute fracture or dislocation. Sacrum obscured by overlying bowel. Atherosclerotic vascular calcifications. Surgical clips project over the right upper quadrant  IMPRESSION: Osteopenia and multilevel degenerative changes. No acute osseous finding.   Electronically Signed   By: Carlos Levering M.D.   On: 04/12/2014 04:26   Dg Hip Complete Right  04/12/2014   CLINICAL DATA:  BACK PAIN, right hip pain. Right hip pops when walking.  EXAM: RIGHT HIP - COMPLETE 2+ VIEW  COMPARISON:  03/04/2014 contralateral hip radiographs  FINDINGS: Osteopenia. No displaced acute fracture or dislocation. Mild right hip DJD. Atherosclerotic vascular calcifications. Left femur rod and screw hardware again noted.  IMPRESSION: Mild right hip DJD.  No acute osseous finding.  Recommend MRI if concern for radiographically occult pathology persists.   Electronically Signed   By: Carlos Levering M.D.   On: 04/12/2014 04:21   Dg Abd 1 View  04/12/2014   CLINICAL DATA:  Abdominal distention   EXAM: ABDOMEN - 1 VIEW  COMPARISON:  Abdomen CT 05/02/2008  FINDINGS: Diffuse gaseous distension of small and large bowel. The cecum measures up to 11 cm. When accounting for stool, no definitive pneumatosis. No  evidence of volvulus. Supine imaging is limited for detecting pneumoperitoneum. There is amorphous calcification over the left flank, at least partially explained by atherosclerosis. Cholecystectomy changes. No concerning intra-abdominal mass effect.  No acute osseous findings.  Status post proximal left femur ORIF.  IMPRESSION: Diffuse gaseous bowel distention, favor adynamic ileus over distal colon obstruction.   Electronically Signed   By: Jorje Guild M.D.   On: 04/12/2014 15:19   Mr Hip Right Wo Contrast  04/12/2014   CLINICAL DATA:  Right hip pain for several days. History of breast cancer. Query occult fracture.  EXAM: MR OF THE RIGHT HIP WITHOUT CONTRAST  TECHNIQUE: Multiplanar, multisequence MR imaging was performed. No intravenous contrast was administered.  COMPARISON:  Radiographs from 04/12/2014  FINDINGS: Bones: No right hip fracture. No definite signs of left periprosthetic hip fracture although metal artifact obscures bony structures immediately adjacent to the left hip screw.  Articular cartilage and labrum  Articular cartilage:  Grossly intact  Labrum:  Anterior superior labral tear  Joint or bursal effusion  Joint effusion:  Absent  Bursae:  Mild trochanteric bursitis and adjacent subcutaneous edema.  Muscles and tendons  Muscles and tendons: Torn iliotibial band on the right, image 15 series 2, with surrounding edema.  Other findings  Miscellaneous:  Relatively atrophic gluteus medius and gluteus minimus bilaterally. Incidental lower lumbar spondylosis and degenerative disc disease.  IMPRESSION: 1. Acutely torn right iliotibial band with adjacent trochanteric bursitis and subcutaneous edema. 2. Anterior superior labral tear on the right.   Electronically Signed   By: Sherryl Barters  M.D.   On: 04/12/2014 08:09    Microbiology: No results found for this or any previous visit (from the past 240 hour(s)).   Labs: Basic Metabolic Panel:  Recent Labs Lab 04/12/14 0956  NA 141  K 4.2  CL 108  CO2 26  GLUCOSE 88  BUN 30*  CREATININE 0.64  CALCIUM 8.4   Liver Function Tests: No results found for this basename: AST, ALT, ALKPHOS, BILITOT, PROT, ALBUMIN,  in the last 168 hours No results found for this basename: LIPASE, AMYLASE,  in the last 168 hours No results found for this basename: AMMONIA,  in the last 168 hours CBC:  Recent Labs Lab 04/12/14 0956  WBC 5.8  NEUTROABS 3.5  HGB 11.7*  HCT 35.1*  MCV 90.2  PLT 165   Cardiac Enzymes: No results found for this basename: CKTOTAL, CKMB, CKMBINDEX, TROPONINI,  in the last 168 hours BNP: BNP (last 3 results) No results found for this basename: PROBNP,  in the last 8760 hours CBG:  Recent Labs Lab 04/12/14 1641 04/12/14 2039 04/13/14 0719 04/13/14 1119  GLUCAP 335* 290* 214* 252*       Signed:  BLACK,KAREN M  [facility] 04/13/2014, 12:49 PM    I agree with the History/assesment & plan per Midlevel provider as per above Further details as follows:-              Pauin in leg much better, no specific issues today and came to hopsital becuas eshe was "scared she woulfd fall" although she was able to ambulate slowly  Needs good bowel regimen-passing stool well  Very concerned about staying alone.  May benefit from either ALF placement or a sitter/private helperto assess needs at home in long-term  Verneita Griffes, MD [place] [phone]

## 2014-04-13 NOTE — Progress Notes (Signed)
Prior to DC patients friend voiced concern over patient having BSC available for tonight.  [facility] care, they stated they had not received any orders pertaining to this patient, and even if they were given the order, would not be able to send equipment out tonight.  Patient asked if using [facility] would be ok, as the [facility] could be picked up from the store on the way home.  Patient verbalized that this would be ok.  [place] called and made aware - gave instructions to give patients family the written order form MD and bring to store. Dr. Verlon Au called and notified, giving [facility] # as [facility] requested would be needed.  Patients son given paper order and obtained patient insurance card to take to store.  Patient left in stable condition in [facility] with help of patient advocate.

## 2014-04-13 NOTE — Progress Notes (Signed)
Patient discharging home, waiting arrival for son to take home.  [place] set up by CM.  IV removed - WNL.  Reviewed DC instructions with patient.  Instructed to have follow up appt, with PCP, pt states she believes she already has one in place, but will call to make one if not.  Instructed to notify Dr. [name] of this new pain as well as she is followed by him also.  Instructed on new medications.  HF education done and given regarding swelling, weight gain, and daily weights.  Patient verbalizes understanding.  No questions at this time.  Also educated to apply ice to affected area if needed and to notify MD if having any increased pain.  Patient stable to DC home when son arrives, will be assisted off unit via WC.

## 2014-04-13 NOTE — Clinical Social Work Psychosocial (Signed)
Clinical Social Work Department BRIEF PSYCHOSOCIAL ASSESSMENT 04/13/2014  Patient:  [name], [name]     Account Number:  [id number]     Admit date:  04/12/2014  Clinical Social Worker:  Wyatt Haste Date/Time:  04/13/2014 04:13 PM  Referred by:  CSW  Date Referred:  04/13/2014 Referred for  ALF Placement   Other Referral:   Interview type:  Patient Other interview type:   pastor and friend    PSYCHOSOCIAL DATA Living Status:  ALONE Admitted from facility:   Level of care:   Primary support name:  Grayland Ormond Primary support relationship to patient:  CHILD, ADULT Degree of support available:   supportive per pt    CURRENT CONCERNS Current Concerns  Post-Acute Placement   Other Concerns:    SOCIAL WORK ASSESSMENT / PLAN CSW met with pt, pt's pastor, and a friend at bedside along with CM. Pt alert and oriented x4. Doristine Bosworth and friend very concerned about pt d/c home today. They state that pt cannot walk and is not safe to be at home. They were made aware that pt ambulated with supervision today with PT and recommendation was for home health. Pt lives alone and has two sons. One lives in [place] and [place] checks on pt daily. However, she states that he is currently at the beach and will not be home until Friday. Pt ready for d/c today after overnight observation. CSW and CM discussed options of home with home health vs ALF private pay. Pt's income is just over Medicaid cut off. She states she is not interested in placement and couldn't afford it anyway. However, pt continued to insist that she was too tired to go home today and wanted EMS transport. CSW shared criteria for EMS which pt does not meet. Offered to arrange, with pt aware that she would need to pay the bill. Extensive discussion with everyone present about d/c plan. Friends asked about SNF. CSW explained requirement for 3 day qualifying stay per Medicare criteria which pt has not met. Pt eventually said she would go home today and  her son has arranged to come pick her up. Pt was concerned about her driveway, but pastor said they have a wheelchair available for use. Pt's pastor and friend understood situation following discussion and were appreciative of information and time spent explaining. Pt agreeable to bedside commode as recommended and will also have home health PT/OT, RN, and Education officer, museum. She already has an aid through [employer] 3 days a week for personal care assistance.   Assessment/plan status:  Referral to [employer] Other assessment/ plan:   Information/referral to community resources:   CM for home health/equipment    PATIENT'S/FAMILY'S RESPONSE TO PLAN OF CARE: Pt and those present are agreeable with d/c plan for pt to return home with full home health. Pt initially was hesitant to agree to so many services in her home as she said it messed with her quiet time. CSW will sign off.       Benay Pike, [place]

## 2014-04-13 NOTE — Progress Notes (Signed)
ANTICOAGULATION CONSULT NOTE - follow up  Pharmacy Consult for Coumadin (chronic Rx PTA) Indication: atrial fibrillation  No Known Allergies  Patient Measurements: Height: 5' (152.4 cm) Weight: 134 lb (60.782 kg) IBW/kg (Calculated) : 45.5  Vital Signs: Temp: 97.7 F (36.5 C) (09/02 1419) Temp src: Oral (09/02 1419) BP: 159/51 mmHg (09/02 1419) Pulse Rate: 89 (09/02 1419)  Labs:  Recent Labs  04/12/14 0956 04/13/14 0635  HGB 11.7*  --   HCT 35.1*  --   PLT 165  --   LABPROT 30.5* 29.5*  INR 2.92* 2.80*  CREATININE 0.64  --    Estimated Creatinine Clearance: 37.3 ml/min (by C-G formula based on Cr of 0.64).  Medical History: Past Medical History  Diagnosis Date  . Hypertension   . Carcinoma of breast     left masectomy in 1995  . Atrial fibrillation 2007    CHF with preserved LV systolic function - 4270; moderate LVH  . Arteriosclerotic cardiovascular disease (ASCVD)     cath in 2001- 40% LAD and 50% RCA; stent for 80% LAD in 5/04; residual 50% LAD, 80% small D1, 70% small OM1 50% ostial RCA, nl EF   . MVP (mitral valve prolapse)     moderate; with moderate MR  . PVD (peripheral vascular disease)     ABIs of 0.64 and 0.59, right and left leg in 2009  . CVD (cerebrovascular disease)     plaque w/o focal disease in 2006; h/o CVA  . Diabetes mellitus type II     no insulin   . Hypothyroid   . Osteoporosis   . Edema   . Herpes zoster   . Chronic hoarseness   . Bronchitis     history  . Varicose veins of legs 08/20/2011  . Lower extremity edema 08/20/2011  . Chronic diastolic heart failure 02/02/7627  . 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 Shingles   . Ulcer     left lower leg  . Peripheral neuropathy    Medications:  Prescriptions prior to admission  Medication Sig Dispense Refill  . Acetaminophen 500 MG coapsule Take 250 mg by mouth daily as needed for fever or pain.      . beta carotene w/minerals (OCUVITE) tablet Take 1 tablet by mouth daily.        . Calcium  Carbonate-Vitamin D (CALCARB 600/D PO) Take 1 tablet by mouth at bedtime.      . digoxin (LANOXIN) 0.125 MG tablet Take 125 mcg by mouth daily. Takes after lunch      . diltiazem (DILACOR XR) 180 MG 24 hr capsule Take 180 mg by mouth at bedtime.      . furosemide (LASIX) 20 MG tablet Take 20 mg by mouth every morning.       Marland Kitchen glipiZIDE (GLUCOTROL) 5 MG tablet Take 5 mg by mouth every morning.       Marland Kitchen levothyroxine (SYNTHROID, LEVOTHROID) 25 MCG tablet Take 25 mcg by mouth every morning.       Marland Kitchen losartan (COZAAR) 50 MG tablet Take 50 mg by mouth daily. Daily after lunch      . metFORMIN (GLUCOPHAGE) 500 MG tablet Take 500 mg by mouth 2 (two) times daily with a meal.        . Multiple Vitamins-Minerals (CENTRUM) tablet Take 1 tablet by mouth daily.      . nebivolol (BYSTOLIC) 10 MG tablet Take 10 mg by mouth every morning.       Marland Kitchen omeprazole (PRILOSEC) 20  MG capsule Take 20 mg by mouth 2 (two) times daily.        . potassium chloride SA (K-DUR,KLOR-CON) 20 MEQ tablet Take 20 mEq by mouth every morning.       . raloxifene (EVISTA) 60 MG tablet Take 60 mg by mouth every morning.      . rosuvastatin (CRESTOR) 10 MG tablet Take 10 mg by mouth daily.      Marland Kitchen terazosin (HYTRIN) 5 MG capsule Take 5 mg by mouth at bedtime.      Marland Kitchen warfarin (COUMADIN) 2 MG tablet Take 2-3 mg by mouth at bedtime. Takes one tablet daily on Sun, Tues, Wed, Fri and Sat.  Takes one and one-half tablet on Mon and Thurs.       Assessment: 80yo female on chronic Coumadin PTA.  Home dose is listed above.  INR is therapeutic.  Pt has h/o afib.    Goal of Therapy:  INR 2-3 Monitor platelets by anticoagulation protocol: Yes   Plan:  Coumadin 2mg  po today x 1  INR daily for now  Ena Dawley 04/13/2014,4:02 PM

## 2014-04-13 NOTE — Progress Notes (Signed)
UR Completed.  Cheick Suhr Jane [phone] 04/13/2014  

## 2014-04-14 DIAGNOSIS — I2510 Atherosclerotic heart disease of native coronary artery without angina pectoris: Secondary | ICD-10-CM | POA: Diagnosis not present

## 2014-04-14 DIAGNOSIS — Z7901 Long term (current) use of anticoagulants: Secondary | ICD-10-CM | POA: Diagnosis not present

## 2014-04-14 DIAGNOSIS — I4891 Unspecified atrial fibrillation: Secondary | ICD-10-CM | POA: Diagnosis not present

## 2014-04-14 DIAGNOSIS — I509 Heart failure, unspecified: Secondary | ICD-10-CM | POA: Diagnosis not present

## 2014-04-14 DIAGNOSIS — M24151 Other articular cartilage disorders, right hip: Secondary | ICD-10-CM | POA: Diagnosis not present

## 2014-04-14 DIAGNOSIS — Z5181 Encounter for therapeutic drug level monitoring: Secondary | ICD-10-CM | POA: Diagnosis not present

## 2014-04-14 DIAGNOSIS — E119 Type 2 diabetes mellitus without complications: Secondary | ICD-10-CM | POA: Diagnosis not present

## 2014-04-14 DIAGNOSIS — M7061 Trochanteric bursitis, right hip: Secondary | ICD-10-CM | POA: Diagnosis not present

## 2014-04-14 DIAGNOSIS — I10 Essential (primary) hypertension: Secondary | ICD-10-CM | POA: Diagnosis not present

## 2014-04-15 DIAGNOSIS — M24151 Other articular cartilage disorders, right hip: Secondary | ICD-10-CM | POA: Diagnosis not present

## 2014-04-15 DIAGNOSIS — I2510 Atherosclerotic heart disease of native coronary artery without angina pectoris: Secondary | ICD-10-CM | POA: Diagnosis not present

## 2014-04-15 DIAGNOSIS — E119 Type 2 diabetes mellitus without complications: Secondary | ICD-10-CM | POA: Diagnosis not present

## 2014-04-15 DIAGNOSIS — I509 Heart failure, unspecified: Secondary | ICD-10-CM | POA: Diagnosis not present

## 2014-04-15 DIAGNOSIS — M7061 Trochanteric bursitis, right hip: Secondary | ICD-10-CM | POA: Diagnosis not present

## 2014-04-15 DIAGNOSIS — I4891 Unspecified atrial fibrillation: Secondary | ICD-10-CM | POA: Diagnosis not present

## 2014-04-16 DIAGNOSIS — I2510 Atherosclerotic heart disease of native coronary artery without angina pectoris: Secondary | ICD-10-CM | POA: Diagnosis not present

## 2014-04-16 DIAGNOSIS — I4891 Unspecified atrial fibrillation: Secondary | ICD-10-CM | POA: Diagnosis not present

## 2014-04-16 DIAGNOSIS — M7061 Trochanteric bursitis, right hip: Secondary | ICD-10-CM | POA: Diagnosis not present

## 2014-04-16 DIAGNOSIS — E119 Type 2 diabetes mellitus without complications: Secondary | ICD-10-CM | POA: Diagnosis not present

## 2014-04-16 DIAGNOSIS — I509 Heart failure, unspecified: Secondary | ICD-10-CM | POA: Diagnosis not present

## 2014-04-16 DIAGNOSIS — M24151 Other articular cartilage disorders, right hip: Secondary | ICD-10-CM | POA: Diagnosis not present

## 2014-04-18 DIAGNOSIS — I2510 Atherosclerotic heart disease of native coronary artery without angina pectoris: Secondary | ICD-10-CM | POA: Diagnosis not present

## 2014-04-18 DIAGNOSIS — M24151 Other articular cartilage disorders, right hip: Secondary | ICD-10-CM | POA: Diagnosis not present

## 2014-04-18 DIAGNOSIS — M7061 Trochanteric bursitis, right hip: Secondary | ICD-10-CM | POA: Diagnosis not present

## 2014-04-18 DIAGNOSIS — E119 Type 2 diabetes mellitus without complications: Secondary | ICD-10-CM | POA: Diagnosis not present

## 2014-04-18 DIAGNOSIS — I4891 Unspecified atrial fibrillation: Secondary | ICD-10-CM | POA: Diagnosis not present

## 2014-04-18 DIAGNOSIS — I509 Heart failure, unspecified: Secondary | ICD-10-CM | POA: Diagnosis not present

## 2014-04-19 ENCOUNTER — Emergency Department (HOSPITAL_COMMUNITY): Payer: Medicare Other

## 2014-04-19 ENCOUNTER — Encounter (HOSPITAL_COMMUNITY): Payer: Self-pay | Admitting: Emergency Medicine

## 2014-04-19 ENCOUNTER — Emergency Department (HOSPITAL_COMMUNITY)
Admission: EM | Admit: 2014-04-19 | Discharge: 2014-04-19 | Disposition: A | Discharge status: Home / Self Care | Payer: Medicare Other | Attending: Emergency Medicine | Admitting: Emergency Medicine

## 2014-04-19 DIAGNOSIS — S7001XA Contusion of right hip, initial encounter: Secondary | ICD-10-CM

## 2014-04-19 DIAGNOSIS — M24151 Other articular cartilage disorders, right hip: Secondary | ICD-10-CM | POA: Diagnosis not present

## 2014-04-19 DIAGNOSIS — R279 Unspecified lack of coordination: Secondary | ICD-10-CM | POA: Diagnosis not present

## 2014-04-19 DIAGNOSIS — S41009A Unspecified open wound of unspecified shoulder, initial encounter: Secondary | ICD-10-CM | POA: Diagnosis not present

## 2014-04-19 DIAGNOSIS — S40029A Contusion of unspecified upper arm, initial encounter: Secondary | ICD-10-CM | POA: Insufficient documentation

## 2014-04-19 DIAGNOSIS — M7061 Trochanteric bursitis, right hip: Secondary | ICD-10-CM | POA: Diagnosis not present

## 2014-04-19 DIAGNOSIS — Y929 Unspecified place or not applicable: Secondary | ICD-10-CM | POA: Insufficient documentation

## 2014-04-19 DIAGNOSIS — Z8739 Personal history of other diseases of the musculoskeletal system and connective tissue: Secondary | ICD-10-CM | POA: Insufficient documentation

## 2014-04-19 DIAGNOSIS — Z8673 Personal history of transient ischemic attack (TIA), and cerebral infarction without residual deficits: Secondary | ICD-10-CM | POA: Diagnosis not present

## 2014-04-19 DIAGNOSIS — M7989 Other specified soft tissue disorders: Secondary | ICD-10-CM | POA: Diagnosis not present

## 2014-04-19 DIAGNOSIS — Z8709 Personal history of other diseases of the respiratory system: Secondary | ICD-10-CM | POA: Diagnosis not present

## 2014-04-19 DIAGNOSIS — E119 Type 2 diabetes mellitus without complications: Secondary | ICD-10-CM | POA: Insufficient documentation

## 2014-04-19 DIAGNOSIS — IMO0002 Reserved for concepts with insufficient information to code with codable children: Secondary | ICD-10-CM | POA: Diagnosis not present

## 2014-04-19 DIAGNOSIS — F329 Major depressive disorder, single episode, unspecified: Secondary | ICD-10-CM | POA: Insufficient documentation

## 2014-04-19 DIAGNOSIS — I4891 Unspecified atrial fibrillation: Secondary | ICD-10-CM | POA: Diagnosis not present

## 2014-04-19 DIAGNOSIS — S7000XA Contusion of unspecified hip, initial encounter: Secondary | ICD-10-CM | POA: Insufficient documentation

## 2014-04-19 DIAGNOSIS — Z872 Personal history of diseases of the skin and subcutaneous tissue: Secondary | ICD-10-CM | POA: Diagnosis not present

## 2014-04-19 DIAGNOSIS — S40022A Contusion of left upper arm, initial encounter: Secondary | ICD-10-CM

## 2014-04-19 DIAGNOSIS — F3289 Other specified depressive episodes: Secondary | ICD-10-CM | POA: Diagnosis not present

## 2014-04-19 DIAGNOSIS — Z7901 Long term (current) use of anticoagulants: Secondary | ICD-10-CM | POA: Diagnosis not present

## 2014-04-19 DIAGNOSIS — R296 Repeated falls: Secondary | ICD-10-CM | POA: Insufficient documentation

## 2014-04-19 DIAGNOSIS — Z79899 Other long term (current) drug therapy: Secondary | ICD-10-CM | POA: Insufficient documentation

## 2014-04-19 DIAGNOSIS — E039 Hypothyroidism, unspecified: Secondary | ICD-10-CM | POA: Insufficient documentation

## 2014-04-19 DIAGNOSIS — S8990XA Unspecified injury of unspecified lower leg, initial encounter: Secondary | ICD-10-CM | POA: Insufficient documentation

## 2014-04-19 DIAGNOSIS — I5032 Chronic diastolic (congestive) heart failure: Secondary | ICD-10-CM | POA: Diagnosis not present

## 2014-04-19 DIAGNOSIS — Z8669 Personal history of other diseases of the nervous system and sense organs: Secondary | ICD-10-CM | POA: Diagnosis not present

## 2014-04-19 DIAGNOSIS — Z8619 Personal history of other infectious and parasitic diseases: Secondary | ICD-10-CM | POA: Insufficient documentation

## 2014-04-19 DIAGNOSIS — G319 Degenerative disease of nervous system, unspecified: Secondary | ICD-10-CM | POA: Insufficient documentation

## 2014-04-19 DIAGNOSIS — S5010XA Contusion of unspecified forearm, initial encounter: Secondary | ICD-10-CM | POA: Diagnosis not present

## 2014-04-19 DIAGNOSIS — S301XXA Contusion of abdominal wall, initial encounter: Secondary | ICD-10-CM | POA: Insufficient documentation

## 2014-04-19 DIAGNOSIS — S99919A Unspecified injury of unspecified ankle, initial encounter: Secondary | ICD-10-CM | POA: Diagnosis not present

## 2014-04-19 DIAGNOSIS — I509 Heart failure, unspecified: Secondary | ICD-10-CM | POA: Diagnosis not present

## 2014-04-19 DIAGNOSIS — Z853 Personal history of malignant neoplasm of breast: Secondary | ICD-10-CM | POA: Insufficient documentation

## 2014-04-19 DIAGNOSIS — I10 Essential (primary) hypertension: Secondary | ICD-10-CM | POA: Insufficient documentation

## 2014-04-19 DIAGNOSIS — S4980XA Other specified injuries of shoulder and upper arm, unspecified arm, initial encounter: Secondary | ICD-10-CM | POA: Diagnosis not present

## 2014-04-19 DIAGNOSIS — S46909A Unspecified injury of unspecified muscle, fascia and tendon at shoulder and upper arm level, unspecified arm, initial encounter: Secondary | ICD-10-CM | POA: Diagnosis not present

## 2014-04-19 DIAGNOSIS — Z9861 Coronary angioplasty status: Secondary | ICD-10-CM | POA: Insufficient documentation

## 2014-04-19 DIAGNOSIS — Y9389 Activity, other specified: Secondary | ICD-10-CM | POA: Diagnosis not present

## 2014-04-19 DIAGNOSIS — R404 Transient alteration of awareness: Secondary | ICD-10-CM | POA: Diagnosis not present

## 2014-04-19 DIAGNOSIS — I2510 Atherosclerotic heart disease of native coronary artery without angina pectoris: Secondary | ICD-10-CM | POA: Diagnosis not present

## 2014-04-19 DIAGNOSIS — R079 Chest pain, unspecified: Secondary | ICD-10-CM | POA: Diagnosis not present

## 2014-04-19 DIAGNOSIS — S99929A Unspecified injury of unspecified foot, initial encounter: Secondary | ICD-10-CM

## 2014-04-19 LAB — CBC
HCT: 37.6 % (ref 36.0–46.0)
Hemoglobin: 12.4 g/dL (ref 12.0–15.0)
MCH: 29.8 pg (ref 26.0–34.0)
MCHC: 33 g/dL (ref 30.0–36.0)
MCV: 90.4 fL (ref 78.0–100.0)
PLATELETS: 246 10*3/uL (ref 150–400)
RBC: 4.16 MIL/uL (ref 3.87–5.11)
RDW: 15.1 % (ref 11.5–15.5)
WBC: 9.2 10*3/uL (ref 4.0–10.5)

## 2014-04-19 LAB — COMPREHENSIVE METABOLIC PANEL
ALK PHOS: 60 U/L (ref 39–117)
ALT: 16 U/L (ref 0–35)
AST: 12 U/L (ref 0–37)
Albumin: 3 g/dL — ABNORMAL LOW (ref 3.5–5.2)
Anion gap: 9 (ref 5–15)
BUN: 26 mg/dL — ABNORMAL HIGH (ref 6–23)
CHLORIDE: 101 meq/L (ref 96–112)
CO2: 29 mEq/L (ref 19–32)
Calcium: 9 mg/dL (ref 8.4–10.5)
Creatinine, Ser: 0.8 mg/dL (ref 0.50–1.10)
GFR calc non Af Amer: 63 mL/min — ABNORMAL LOW (ref >90)
GFR, EST AFRICAN AMERICAN: 72 mL/min — AB (ref >90)
GLUCOSE: 145 mg/dL — AB (ref 70–99)
POTASSIUM: 4.4 meq/L (ref 3.7–5.3)
SODIUM: 139 meq/L (ref 137–147)
TOTAL PROTEIN: 6.1 g/dL (ref 6.0–8.3)
Total Bilirubin: 0.3 mg/dL (ref 0.3–1.2)

## 2014-04-19 LAB — DIFFERENTIAL
Basophils Absolute: 0 10*3/uL (ref 0.0–0.1)
Basophils Relative: 0 % (ref 0–1)
Eosinophils Absolute: 0.2 10*3/uL (ref 0.0–0.7)
Eosinophils Relative: 2 % (ref 0–5)
LYMPHS ABS: 1.6 10*3/uL (ref 0.7–4.0)
LYMPHS PCT: 18 % (ref 12–46)
Monocytes Absolute: 1 10*3/uL (ref 0.1–1.0)
Monocytes Relative: 10 % (ref 3–12)
NEUTROS ABS: 6.5 10*3/uL (ref 1.7–7.7)
NEUTROS PCT: 70 % (ref 43–77)

## 2014-04-19 LAB — URINALYSIS, ROUTINE W REFLEX MICROSCOPIC
Bilirubin Urine: NEGATIVE
Glucose, UA: NEGATIVE mg/dL
HGB URINE DIPSTICK: NEGATIVE
Ketones, ur: NEGATIVE mg/dL
Leukocytes, UA: NEGATIVE
Nitrite: NEGATIVE
Protein, ur: NEGATIVE mg/dL
Urobilinogen, UA: 0.2 mg/dL (ref 0.0–1.0)
pH: 6 (ref 5.0–8.0)

## 2014-04-19 LAB — PROTIME-INR
INR: 2.25 — AB (ref 0.00–1.49)
PROTHROMBIN TIME: 24.9 s — AB (ref 11.6–15.2)

## 2014-04-19 LAB — APTT: APTT: 32 s (ref 24–37)

## 2014-04-19 NOTE — ED Provider Notes (Signed)
CSN: [id number]     Arrival date & time 04/19/14  1609 History  This chart was scribed for [name] Rank, MD by Delphia Grates, ED Scribe. This patient was seen in room APA12/APA12 and the patient's care was started at 4:55 PM.      Chief Complaint  Patient presents with  . Arm Pain     The history is provided by the patient. No language interpreter was used.    HPI Comments: [name] is a 80 y.o. female who presents to the Emergency Department complaining of constant left arm pain that began today. Patient states she was attempting to weigh herself when she lost her balance and fell. Patient reports left arm was already sore, but states the fall made it worse. There is associated generalized weakness. She states she was constipated, but this has resolved (last BM today). Patient was seen here recently (April 12, 2014) for a "lateral right hip tear" that was not aggravated by the fall.  She denies fever, cough, vomiting, and diarrhea. Patient has history of HTN, A-fib, MVP, PVD, CVD, DM, hypothyroid, osteoporosis, peripheral neuropathy. Patient resides by herself, and desires to be placed in a rehab facility.  Past Medical History  Diagnosis Date  . Hypertension   . Carcinoma of breast     left masectomy in 1995  . Atrial fibrillation 2007    CHF with preserved LV systolic function - 6195; moderate LVH  . Arteriosclerotic cardiovascular disease (ASCVD)     cath in 2001- 40% LAD and 50% RCA; stent for 80% LAD in 5/04; residual 50% LAD, 80% small D1, 70% small OM1 50% ostial RCA, nl EF   . MVP (mitral valve prolapse)     moderate; with moderate MR  . PVD (peripheral vascular disease)     ABIs of 0.64 and 0.59, right and left leg in 2009  . CVD (cerebrovascular disease)     plaque w/o focal disease in 2006; h/o CVA  . Diabetes mellitus type II     no insulin   . Hypothyroid   . Osteoporosis   . Edema   . Herpes zoster   . Chronic hoarseness   . Bronchitis     history  .  Varicose veins of legs 08/20/2011  . Lower extremity edema 08/20/2011  . Chronic diastolic heart failure 0/93/2671  . 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 Shingles   . Ulcer     left lower leg  . Peripheral neuropathy    Past Surgical History  Procedure Laterality Date  . Orif of left hip  05/22/06    [name]  . Left masectomy  1995  . Cholecystectomy  2006  . Abdominal hysterectomy      abd?  . Cataract extraction, bilateral    . Colonoscopy  Date unknown  . Coronary angioplasty with stent placement     Family History  Problem Relation Age of Onset  . Diabetes Mother    History  Substance Use Topics  . Smoking status: Never Smoker   . Smokeless tobacco: Never Used     Comment: tobacco use - no   . Alcohol Use: No   OB History   Grav Para Term Preterm Abortions TAB SAB Ect Mult Living                 Review of Systems A complete 10 system review of systems was obtained and all systems are negative except as noted in the HPI and PMH.  Allergies  Review of patient's allergies indicates no known allergies.  Home Medications   Prior to Admission medications   Medication Sig Start Date End Date Taking? Authorizing Provider  Acetaminophen 500 MG coapsule Take 250 mg by mouth daily as needed for fever or pain.   Yes Historical Provider, MD  beta carotene w/minerals (OCUVITE) tablet Take 1 tablet by mouth daily.     Yes Historical Provider, MD  Calcium Carbonate-Vitamin D (CALCARB 600/D PO) Take 1 tablet by mouth at bedtime.   Yes Historical Provider, MD  digoxin (LANOXIN) 0.125 MG tablet Take 125 mcg by mouth daily. Takes after lunch   Yes Historical Provider, MD  diltiazem (DILACOR XR) 180 MG 24 hr capsule Take 180 mg by mouth at bedtime.   Yes Historical Provider, MD  furosemide (LASIX) 20 MG tablet Take 20 mg by mouth every morning.    Yes Historical Provider, MD  glipiZIDE (GLUCOTROL) 5 MG tablet Take 5 mg by mouth every morning.    Yes Historical Provider, MD   levothyroxine (SYNTHROID, LEVOTHROID) 25 MCG tablet Take 25 mcg by mouth every morning.    Yes Historical Provider, MD  losartan (COZAAR) 50 MG tablet Take 50 mg by mouth daily. Daily after lunch   Yes Historical Provider, MD  metFORMIN (GLUCOPHAGE) 500 MG tablet Take 500 mg by mouth 2 (two) times daily with a meal.     Yes Historical Provider, MD  Multiple Vitamins-Minerals (CENTRUM) tablet Take 1 tablet by mouth daily.   Yes Historical Provider, MD  nebivolol (BYSTOLIC) 10 MG tablet Take 10 mg by mouth every morning.    Yes Historical Provider, MD  omeprazole (PRILOSEC) 20 MG capsule Take 20 mg by mouth 2 (two) times daily.     Yes Historical Provider, MD  potassium chloride SA (K-DUR,KLOR-CON) 20 MEQ tablet Take 20 mEq by mouth every morning.    Yes Historical Provider, MD  psyllium (HYDROCIL/METAMUCIL) 95 % PACK Take 1 packet by mouth daily. 04/13/14  Yes Radene Gunning, NP  raloxifene (EVISTA) 60 MG tablet Take 60 mg by mouth every morning.   Yes Historical Provider, MD  rosuvastatin (CRESTOR) 10 MG tablet Take 10 mg by mouth daily.   Yes Historical Provider, MD  terazosin (HYTRIN) 5 MG capsule Take 5 mg by mouth at bedtime.   Yes Historical Provider, MD  traMADol-acetaminophen (ULTRACET) 37.5-325 MG per tablet Take 1 tablet by mouth every 4 (four) hours as needed for moderate pain. 04/13/14  Yes Lezlie Octave Black, NP  warfarin (COUMADIN) 2 MG tablet Take 2-3 mg by mouth at bedtime. Takes one tablet daily on Sun, Tues, Wed, Fri and Sat.  Takes one and one-half tablet on Mon and Thurs.   Yes Historical Provider, MD   Triage Vitals: BP 160/90  Pulse 57  Temp(Src) 98.6 F (37 C) (Oral)  Resp 20  SpO2 97%  Physical Exam  Nursing note and vitals reviewed. Constitutional: No distress.  HENT:  Head: Normocephalic and atraumatic.  Right Ear: External ear normal.  Left Ear: External ear normal.  Eyes: Conjunctivae are normal. Right eye exhibits no discharge. Left eye exhibits no discharge. No  scleral icterus.  Neck: Neck supple. No tracheal deviation present.  Cardiovascular: Normal rate, regular rhythm and intact distal pulses.   Pulmonary/Chest: Effort normal and breath sounds normal. No stridor. No respiratory distress. She has no wheezes. She has no rales.  Abdominal: Soft. Bowel sounds are normal. She exhibits no distension. There is no tenderness. There is no  rebound and no guarding.  Musculoskeletal: She exhibits no edema.       Left shoulder: She exhibits normal range of motion, no deformity and no laceration.       Left elbow: She exhibits no swelling and no effusion.       Right hip: She exhibits tenderness (ecchymoses right flank). She exhibits no bony tenderness.       Left knee: She exhibits swelling and effusion. She exhibits no deformity and no erythema.       Left upper arm: She exhibits tenderness. She exhibits no swelling, no edema and no deformity.  Neurological: She is alert. She has normal strength. No cranial nerve deficit (no facial droop, extraocular movements intact, no slurred speech) or sensory deficit. She exhibits normal muscle tone. She displays no seizure activity. Coordination normal.  Skin: Skin is warm and dry. No rash noted.  Psychiatric: She exhibits a depressed mood.    ED Course  Procedures (including critical care time)  DIAGNOSTIC STUDIES: Oxygen Saturation is 97% on room air, adequate by my interpretation.    COORDINATION OF CARE: At 1700 Discussed treatment plan with patient which includes imaging and labs. Patient agrees.   7:29 PM - Labs delayed. Currently awaiting results.   Labs Review Labs Reviewed  PROTIME-INR  APTT  CBC  DIFFERENTIAL  COMPREHENSIVE METABOLIC PANEL  URINALYSIS, ROUTINE W REFLEX MICROSCOPIC  I-STAT TROPOININ, ED  UA normal, CBC nl INR 2.25, troponin normal, chem panel normal except glucose 145 and BUN 26   Imaging Review Dg Chest 2 View  04/19/2014   CLINICAL DATA:  Left chest and arm pain. Weakness.  Off balance. Diabetes and hypertension.  EXAM: CHEST  2 VIEW  COMPARISON:  None.  FINDINGS: Moderate cardiomegaly remains stable. No evidence of acute infiltrate or edema. No evidence of pleural effusion. No mass or lymphadenopathy identified. Patient is partially rotated to the left.  IMPRESSION: Stable cardiomegaly.  No active lung disease.   Electronically Signed   By: Earle Gell M.D.   On: 04/19/2014 18:50   Ct Head Wo Contrast  04/19/2014   CLINICAL DATA:  Loss of balance.  No loss of consciousness.  EXAM: CT HEAD WITHOUT CONTRAST  TECHNIQUE: Contiguous axial images were obtained from the base of the skull through the vertex without intravenous contrast.  COMPARISON:  10/18/2010  FINDINGS: Re- demonstrated advanced atrophy with diffuse sulcal prominence and a mildly asymmetric prominence of the bifrontal extra-axial spaces, right greater than left, grossly unchanged. Unchanged scattered minimal periventricular hypodensities compatible with microvascular ischemic disease. Old lacunar infarct within the left insular cortex (image 15, series 2). The gray-white differentiation is otherwise well maintained without CT evidence of acute large territory infarct. Unchanged size and configuration of the ventricles and basilar cisterns. No midline shift. No intraparenchymal or extra-axial mass or hemorrhage. There are unchanged exuberant calcifications about the midline and frontal falx. Intracranial atherosclerosis. Limited visualization the paranasal sinuses and mastoid air cells are normal. Post bilateral cataract surgery. Regional soft tissues appear normal.  IMPRESSION: Stable findings of advanced atrophy and mild microvascular ischemic disease without acute intracranial process.   Electronically Signed   By: Sandi Mariscal M.D.   On: 04/19/2014 18:34   Dg Humerus Left  04/19/2014   CLINICAL DATA:  80 year old patient with the arm pain. Twisted arm while trying to prevent falling.  EXAM: LEFT HUMERUS - 2+ VIEW   COMPARISON:  None.  FINDINGS: The humerus appears osteopenic. No acute or healing fracture or focal bony  abnormality is identified. No focal soft tissue swelling appreciated.  IMPRESSION: No evidence of acute bony abnormality.  Osteopenia.   Electronically Signed   By: Curlene Dolphin M.D.   On: 04/19/2014 18:50     EKG Interpretation   Date/Time:  Tuesday April 19 2014 17:28:01 EDT Ventricular Rate:  93 PR Interval:    QRS Duration: 87 QT Interval:  354 QTC Calculation: 440 R Axis:   63 Text Interpretation:  Atrial fibrillation Minimal ST depression, diffuse  leads No significant change since last tracing Confirmed by Maor Meckel  MD-J,  Eriberto Felch ([id number]) on 04/19/2014 5:35:44 PM      MDM   Final diagnoses:  Contusion, hip, right, initial encounter  Arm contusion, left, initial encounter    Pt was recently in the hospital for similar symptoms.  She has not been in to follow up with her doctor regarding any rehab or home assistance.  Case management saw patient in the ED.  She has been able to walk with her walker.  She is not bed ridden.  Pt does not meet criteria for inpatient hospitalization.  Discussed with family PCP follow up to arrange to discuss assisted living type arrangement.  I personally performed the services described in this documentation, which was scribed in my presence.  The recorded information has been reviewed and is accurate.    [name] Rank, MD 04/19/14 2105

## 2014-04-19 NOTE — Clinical Social Work Note (Signed)
CSW referred for placement. Met with pt and pt's sons in ED. Pt is known to CSW from admission last week. Pt went home with full home health services. She states she is unable to walk now. However, after discussion, pt said she can walk it is just very slow. Pt and sons are aware that placement would be private pay as she does not have qualifying 3 day stay. Discussed ALF placement. Pt's son states pt has long term care insurance. CSW advised them to look into policy for coverage information. ALF list provided. CM checking with [facility] to make sure social worker is involved to assist. Home health PT from [facility] today was to call Dr Hall per pt to discuss possible placement. Sons appreciative of information. Notified RN of conversation. Sons are agreeable to step in and assist pt more at home until placement can be arranged. One son has been with pt for the past few days.    , LCSW [phone] 

## 2014-04-19 NOTE — ED Notes (Signed)
Discharge instructions given to son.  Son verbalized understanding to follow up with Dr. Nevada Crane tomorrow.  Patient discharged home in good condition via wheelchair.

## 2014-04-19 NOTE — Discharge Instructions (Signed)
Contusion °A contusion is a deep bruise. Contusions are the result of an injury that caused bleeding under the skin. The contusion may turn blue, purple, or yellow. Minor injuries will give you a painless contusion, but more severe contusions may stay painful and swollen for a few weeks.  °CAUSES  °A contusion is usually caused by a blow, trauma, or direct force to an area of the body. °SYMPTOMS  °· Swelling and redness of the injured area. °· Bruising of the injured area. °· Tenderness and soreness of the injured area. °· Pain. °DIAGNOSIS  °The diagnosis can be made by taking a history and physical exam. An X-ray, CT scan, or MRI may be needed to determine if there were any associated injuries, such as fractures. °TREATMENT  °Specific treatment will depend on what area of the body was injured. In general, the best treatment for a contusion is resting, icing, elevating, and applying cold compresses to the injured area. Over-the-counter medicines may also be recommended for pain control. Ask your caregiver what the best treatment is for your contusion. °HOME CARE INSTRUCTIONS  °· Put ice on the injured area. °¨ Put ice in a plastic bag. °¨ Place a towel between your skin and the bag. °¨ Leave the ice on for 15-20 minutes, 3-4 times a day, or as directed by your health care provider. °· Only take over-the-counter or prescription medicines for pain, discomfort, or fever as directed by your caregiver. Your caregiver may recommend avoiding anti-inflammatory medicines (aspirin, ibuprofen, and naproxen) for 48 hours because these medicines may increase bruising. °· Rest the injured area. °· If possible, elevate the injured area to reduce swelling. °SEEK IMMEDIATE MEDICAL CARE IF:  °· You have increased bruising or swelling. °· You have pain that is getting worse. °· Your swelling or pain is not relieved with medicines. °MAKE SURE YOU:  °· Understand these instructions. °· Will watch your condition. °· Will get help right  away if you are not doing well or get worse. °Document Released: 05/08/2005 Document Revised: 08/03/2013 Document Reviewed: 06/03/2011 °[employer]® Patient Information ©2015 [employer]. This information is not intended to replace advice given to you by your health care provider. Make sure you discuss any questions you have with your health care provider. ° °

## 2014-04-19 NOTE — ED Notes (Signed)
Meal tray given to pt.

## 2014-04-19 NOTE — ED Notes (Signed)
Social worker called and here to speak with pt and son regarding placement

## 2014-04-19 NOTE — ED Notes (Signed)
Pt states she was attempting to weigh herself when she lost her balance and twisted her left arm. Pt states she wants to be placed somewhere so she can be helped with her balance

## 2014-04-20 ENCOUNTER — Other Ambulatory Visit: Payer: Self-pay | Admitting: Cardiology

## 2014-04-20 DIAGNOSIS — M7061 Trochanteric bursitis, right hip: Secondary | ICD-10-CM | POA: Diagnosis not present

## 2014-04-20 DIAGNOSIS — M24151 Other articular cartilage disorders, right hip: Secondary | ICD-10-CM | POA: Diagnosis not present

## 2014-04-20 DIAGNOSIS — I2510 Atherosclerotic heart disease of native coronary artery without angina pectoris: Secondary | ICD-10-CM | POA: Diagnosis not present

## 2014-04-20 DIAGNOSIS — E119 Type 2 diabetes mellitus without complications: Secondary | ICD-10-CM | POA: Diagnosis not present

## 2014-04-20 DIAGNOSIS — I509 Heart failure, unspecified: Secondary | ICD-10-CM | POA: Diagnosis not present

## 2014-04-20 DIAGNOSIS — I4891 Unspecified atrial fibrillation: Secondary | ICD-10-CM | POA: Diagnosis not present

## 2014-04-20 LAB — TROPONIN I: Troponin I: <0.3 ng/mL (ref <0.30)

## 2014-04-21 DIAGNOSIS — E119 Type 2 diabetes mellitus without complications: Secondary | ICD-10-CM | POA: Diagnosis not present

## 2014-04-21 DIAGNOSIS — I509 Heart failure, unspecified: Secondary | ICD-10-CM | POA: Diagnosis not present

## 2014-04-21 DIAGNOSIS — M24151 Other articular cartilage disorders, right hip: Secondary | ICD-10-CM | POA: Diagnosis not present

## 2014-04-21 DIAGNOSIS — I4891 Unspecified atrial fibrillation: Secondary | ICD-10-CM | POA: Diagnosis not present

## 2014-04-21 DIAGNOSIS — M7061 Trochanteric bursitis, right hip: Secondary | ICD-10-CM | POA: Diagnosis not present

## 2014-04-21 DIAGNOSIS — I2510 Atherosclerotic heart disease of native coronary artery without angina pectoris: Secondary | ICD-10-CM | POA: Diagnosis not present

## 2014-04-22 ENCOUNTER — Telehealth: Payer: Self-pay | Admitting: Cardiology

## 2014-04-22 ENCOUNTER — Other Ambulatory Visit: Payer: Self-pay | Admitting: Cardiology

## 2014-04-22 MED ORDER — DILTIAZEM HCL ER 180 MG PO CP24: 180.0000 mg | ORAL_CAPSULE | Freq: Every day | ORAL | Status: DC

## 2014-04-22 NOTE — Telephone Encounter (Signed)
Pt's son called, his mother was out of cardizem, I refilled CD 180 mg #90 with 3 refills, she had been a pt of Dr. Lattie Haw but now sees Dr. Harrington Challenger.  Last seen in June and meds were continued.

## 2014-04-25 DIAGNOSIS — M24151 Other articular cartilage disorders, right hip: Secondary | ICD-10-CM | POA: Diagnosis not present

## 2014-04-25 DIAGNOSIS — E119 Type 2 diabetes mellitus without complications: Secondary | ICD-10-CM | POA: Diagnosis not present

## 2014-04-25 DIAGNOSIS — I2510 Atherosclerotic heart disease of native coronary artery without angina pectoris: Secondary | ICD-10-CM | POA: Diagnosis not present

## 2014-04-25 DIAGNOSIS — I509 Heart failure, unspecified: Secondary | ICD-10-CM | POA: Diagnosis not present

## 2014-04-25 DIAGNOSIS — M7061 Trochanteric bursitis, right hip: Secondary | ICD-10-CM | POA: Diagnosis not present

## 2014-04-25 DIAGNOSIS — I4891 Unspecified atrial fibrillation: Secondary | ICD-10-CM | POA: Diagnosis not present

## 2014-04-27 DIAGNOSIS — M24151 Other articular cartilage disorders, right hip: Secondary | ICD-10-CM | POA: Diagnosis not present

## 2014-04-27 DIAGNOSIS — M7061 Trochanteric bursitis, right hip: Secondary | ICD-10-CM | POA: Diagnosis not present

## 2014-04-27 DIAGNOSIS — I2510 Atherosclerotic heart disease of native coronary artery without angina pectoris: Secondary | ICD-10-CM | POA: Diagnosis not present

## 2014-04-27 DIAGNOSIS — I509 Heart failure, unspecified: Secondary | ICD-10-CM | POA: Diagnosis not present

## 2014-04-27 DIAGNOSIS — E119 Type 2 diabetes mellitus without complications: Secondary | ICD-10-CM | POA: Diagnosis not present

## 2014-04-27 DIAGNOSIS — I4891 Unspecified atrial fibrillation: Secondary | ICD-10-CM | POA: Diagnosis not present

## 2014-04-28 ENCOUNTER — Ambulatory Visit (INDEPENDENT_AMBULATORY_CARE_PROVIDER_SITE_OTHER): Payer: Medicare Other | Admitting: *Deleted

## 2014-04-28 ENCOUNTER — Telehealth: Payer: Self-pay | Admitting: *Deleted

## 2014-04-28 DIAGNOSIS — M7061 Trochanteric bursitis, right hip: Secondary | ICD-10-CM | POA: Diagnosis not present

## 2014-04-28 DIAGNOSIS — E119 Type 2 diabetes mellitus without complications: Secondary | ICD-10-CM | POA: Diagnosis not present

## 2014-04-28 DIAGNOSIS — M24151 Other articular cartilage disorders, right hip: Secondary | ICD-10-CM | POA: Diagnosis not present

## 2014-04-28 DIAGNOSIS — Z5181 Encounter for therapeutic drug level monitoring: Secondary | ICD-10-CM

## 2014-04-28 DIAGNOSIS — I2510 Atherosclerotic heart disease of native coronary artery without angina pectoris: Secondary | ICD-10-CM | POA: Diagnosis not present

## 2014-04-28 DIAGNOSIS — I509 Heart failure, unspecified: Secondary | ICD-10-CM | POA: Diagnosis not present

## 2014-04-28 DIAGNOSIS — I4891 Unspecified atrial fibrillation: Secondary | ICD-10-CM | POA: Diagnosis not present

## 2014-04-28 LAB — POCT INR: INR: 2.6

## 2014-04-28 NOTE — Telephone Encounter (Signed)
See coumadin note. 

## 2014-04-28 NOTE — Telephone Encounter (Signed)
INR 2.6

## 2014-05-03 DIAGNOSIS — I509 Heart failure, unspecified: Secondary | ICD-10-CM | POA: Diagnosis not present

## 2014-05-03 DIAGNOSIS — I2510 Atherosclerotic heart disease of native coronary artery without angina pectoris: Secondary | ICD-10-CM | POA: Diagnosis not present

## 2014-05-03 DIAGNOSIS — M24151 Other articular cartilage disorders, right hip: Secondary | ICD-10-CM | POA: Diagnosis not present

## 2014-05-03 DIAGNOSIS — E119 Type 2 diabetes mellitus without complications: Secondary | ICD-10-CM | POA: Diagnosis not present

## 2014-05-03 DIAGNOSIS — I4891 Unspecified atrial fibrillation: Secondary | ICD-10-CM | POA: Diagnosis not present

## 2014-05-03 DIAGNOSIS — M7061 Trochanteric bursitis, right hip: Secondary | ICD-10-CM | POA: Diagnosis not present

## 2014-05-05 DIAGNOSIS — M7061 Trochanteric bursitis, right hip: Secondary | ICD-10-CM | POA: Diagnosis not present

## 2014-05-05 DIAGNOSIS — I4891 Unspecified atrial fibrillation: Secondary | ICD-10-CM | POA: Diagnosis not present

## 2014-05-05 DIAGNOSIS — E119 Type 2 diabetes mellitus without complications: Secondary | ICD-10-CM | POA: Diagnosis not present

## 2014-05-05 DIAGNOSIS — M24151 Other articular cartilage disorders, right hip: Secondary | ICD-10-CM | POA: Diagnosis not present

## 2014-05-05 DIAGNOSIS — I2510 Atherosclerotic heart disease of native coronary artery without angina pectoris: Secondary | ICD-10-CM | POA: Diagnosis not present

## 2014-05-05 DIAGNOSIS — I509 Heart failure, unspecified: Secondary | ICD-10-CM | POA: Diagnosis not present

## 2014-05-10 DIAGNOSIS — I4891 Unspecified atrial fibrillation: Secondary | ICD-10-CM | POA: Diagnosis not present

## 2014-05-10 DIAGNOSIS — M24151 Other articular cartilage disorders, right hip: Secondary | ICD-10-CM | POA: Diagnosis not present

## 2014-05-10 DIAGNOSIS — I2510 Atherosclerotic heart disease of native coronary artery without angina pectoris: Secondary | ICD-10-CM | POA: Diagnosis not present

## 2014-05-10 DIAGNOSIS — I509 Heart failure, unspecified: Secondary | ICD-10-CM | POA: Diagnosis not present

## 2014-05-10 DIAGNOSIS — E119 Type 2 diabetes mellitus without complications: Secondary | ICD-10-CM | POA: Diagnosis not present

## 2014-05-10 DIAGNOSIS — M7061 Trochanteric bursitis, right hip: Secondary | ICD-10-CM | POA: Diagnosis not present

## 2014-05-12 DIAGNOSIS — I4891 Unspecified atrial fibrillation: Secondary | ICD-10-CM | POA: Diagnosis not present

## 2014-05-12 DIAGNOSIS — I2510 Atherosclerotic heart disease of native coronary artery without angina pectoris: Secondary | ICD-10-CM | POA: Diagnosis not present

## 2014-05-12 DIAGNOSIS — M24151 Other articular cartilage disorders, right hip: Secondary | ICD-10-CM | POA: Diagnosis not present

## 2014-05-12 DIAGNOSIS — I509 Heart failure, unspecified: Secondary | ICD-10-CM | POA: Diagnosis not present

## 2014-05-12 DIAGNOSIS — M7061 Trochanteric bursitis, right hip: Secondary | ICD-10-CM | POA: Diagnosis not present

## 2014-05-12 DIAGNOSIS — E119 Type 2 diabetes mellitus without complications: Secondary | ICD-10-CM | POA: Diagnosis not present

## 2014-05-18 ENCOUNTER — Ambulatory Visit (INDEPENDENT_AMBULATORY_CARE_PROVIDER_SITE_OTHER): Payer: PRIVATE HEALTH INSURANCE | Admitting: Cardiology

## 2014-05-18 DIAGNOSIS — I509 Heart failure, unspecified: Secondary | ICD-10-CM | POA: Diagnosis not present

## 2014-05-18 DIAGNOSIS — I4891 Unspecified atrial fibrillation: Secondary | ICD-10-CM | POA: Diagnosis not present

## 2014-05-18 DIAGNOSIS — I2510 Atherosclerotic heart disease of native coronary artery without angina pectoris: Secondary | ICD-10-CM | POA: Diagnosis not present

## 2014-05-18 DIAGNOSIS — Z5181 Encounter for therapeutic drug level monitoring: Secondary | ICD-10-CM

## 2014-05-18 DIAGNOSIS — M7061 Trochanteric bursitis, right hip: Secondary | ICD-10-CM | POA: Diagnosis not present

## 2014-05-18 DIAGNOSIS — M24151 Other articular cartilage disorders, right hip: Secondary | ICD-10-CM | POA: Diagnosis not present

## 2014-05-18 DIAGNOSIS — E119 Type 2 diabetes mellitus without complications: Secondary | ICD-10-CM | POA: Diagnosis not present

## 2014-05-18 LAB — POCT INR: INR: 2.4

## 2014-06-08 ENCOUNTER — Ambulatory Visit (INDEPENDENT_AMBULATORY_CARE_PROVIDER_SITE_OTHER): Payer: PRIVATE HEALTH INSURANCE | Admitting: *Deleted

## 2014-06-08 DIAGNOSIS — I509 Heart failure, unspecified: Secondary | ICD-10-CM | POA: Diagnosis not present

## 2014-06-08 DIAGNOSIS — E119 Type 2 diabetes mellitus without complications: Secondary | ICD-10-CM | POA: Diagnosis not present

## 2014-06-08 DIAGNOSIS — M24151 Other articular cartilage disorders, right hip: Secondary | ICD-10-CM | POA: Diagnosis not present

## 2014-06-08 DIAGNOSIS — M7061 Trochanteric bursitis, right hip: Secondary | ICD-10-CM | POA: Diagnosis not present

## 2014-06-08 DIAGNOSIS — I2510 Atherosclerotic heart disease of native coronary artery without angina pectoris: Secondary | ICD-10-CM | POA: Diagnosis not present

## 2014-06-08 DIAGNOSIS — I4891 Unspecified atrial fibrillation: Secondary | ICD-10-CM | POA: Diagnosis not present

## 2014-06-08 DIAGNOSIS — Z5181 Encounter for therapeutic drug level monitoring: Secondary | ICD-10-CM

## 2014-06-08 LAB — POCT INR: INR: 2.9

## 2014-06-09 ENCOUNTER — Ambulatory Visit (HOSPITAL_COMMUNITY): Payer: Medicare Other

## 2014-07-04 ENCOUNTER — Telehealth: Payer: Self-pay | Admitting: *Deleted

## 2014-07-04 NOTE — Telephone Encounter (Signed)
-----   Message from Malen Gauze, RN sent at 06/30/2014  5:15 PM EST ----- Spoke with Lemmie Evens RN [facility].  States they can not come out just to do a protime as I explained to pt yesterday.  Has to have some other medical necessity.  Pt was ambulating well with rolling walker when they discharged pt on 10/28.   Private pay for [facility] to come out to do an INR is $150.  Told pt to call the [facility] to see when they expect to have an opening for pt.  She will do so on Monday and call me back. ----- Message -----    From: Malen Gauze, RN    Sent: 06/29/2014   5:07 PM      To: Malen Gauze, RN  Called [facility] about checking INR on homebound status.

## 2014-07-14 ENCOUNTER — Telehealth: Payer: Self-pay | Admitting: *Deleted

## 2014-07-14 NOTE — Telephone Encounter (Signed)
Pt states she still has not had her coumadin checked.  She is waiting on a room at the [facility] but nothing is available.  States she may go to [facility] but doesn't know if they have an opening or not.  Plans to call them Monday.  Told her she needs to to come to the office for INR check as we discussed last week.  Told her if she would get her son to bring her to the office I would got to the parking lot and stick her finger.  She just doesn't want to get out.  She will talk with her son this weekend and call me back on Monday.

## 2014-07-14 NOTE — Telephone Encounter (Signed)
Patient wants return call / tgs

## 2014-07-20 ENCOUNTER — Telehealth: Payer: Self-pay | Admitting: *Deleted

## 2014-07-20 NOTE — Telephone Encounter (Signed)
Pt just wants to talk to [facility]......she is past due for follow up and is unable to get here

## 2014-07-25 ENCOUNTER — Telehealth: Payer: Self-pay | Admitting: *Deleted

## 2014-07-25 NOTE — Telephone Encounter (Signed)
Pt is calling to see if you were able to talk to Dr Harrington Challenger yet.

## 2014-07-26 NOTE — Telephone Encounter (Signed)
-----   Message from Malen Gauze, RN sent at 07/20/2014  1:49 PM EST ----- Please review ED note from 04/19/14 and my recent telephone notes. Pt has not been in to have INR checked since she was discharged from [facility] post hospitalization.  States she is not able to get out to come to office for appt.  Insurance will not pay for [facility] to come out just to check INR.  HH states pt was not homebound when PT discharged her.  Have offered pt every option imaginable to get her INR checked but she still has not made an effort to come in.  She is waiting SNF placement but no availability at this time.  Pt wants to know if she can come off coumadin.  I have discussed the risks with pt and she verbalized understanding.  Please advise. Thanks

## 2014-07-26 NOTE — Telephone Encounter (Signed)
Dr Harrington Challenger, Please advise [name]

## 2014-07-26 NOTE — Telephone Encounter (Signed)
Dr Harrington Challenger questioned if pt would try Eliquis.  I spoke with pt and she states someone from Dr [name] office is coming out to check her coumadin tomorrow.  She does not want to change to Eliquis at this time.  She will see how INR is tomorrow and let me know.  She is still awaiting nursing home placement.

## 2014-07-27 DIAGNOSIS — Z7901 Long term (current) use of anticoagulants: Secondary | ICD-10-CM | POA: Diagnosis not present

## 2014-07-28 ENCOUNTER — Ambulatory Visit (INDEPENDENT_AMBULATORY_CARE_PROVIDER_SITE_OTHER): Payer: Medicare Other | Admitting: *Deleted

## 2014-07-28 DIAGNOSIS — Z5181 Encounter for therapeutic drug level monitoring: Secondary | ICD-10-CM

## 2014-07-28 DIAGNOSIS — Z7901 Long term (current) use of anticoagulants: Secondary | ICD-10-CM | POA: Diagnosis not present

## 2014-07-28 DIAGNOSIS — I4891 Unspecified atrial fibrillation: Secondary | ICD-10-CM | POA: Diagnosis not present

## 2014-07-28 DIAGNOSIS — I639 Cerebral infarction, unspecified: Secondary | ICD-10-CM

## 2014-07-28 LAB — POCT INR: INR: 2.57

## 2014-08-29 ENCOUNTER — Telehealth: Payer: Self-pay | Admitting: *Deleted

## 2014-08-29 DIAGNOSIS — I480 Paroxysmal atrial fibrillation: Secondary | ICD-10-CM

## 2014-08-29 NOTE — Telephone Encounter (Signed)
PT/INR entered into Epic and faxed to [facility]

## 2014-08-30 DIAGNOSIS — I4891 Unspecified atrial fibrillation: Secondary | ICD-10-CM | POA: Diagnosis not present

## 2014-08-30 DIAGNOSIS — Z7901 Long term (current) use of anticoagulants: Secondary | ICD-10-CM | POA: Diagnosis not present

## 2014-08-30 LAB — PROTIME-INR: INR: 3.2 — AB (ref 0.9–1.1)

## 2014-08-31 ENCOUNTER — Ambulatory Visit (INDEPENDENT_AMBULATORY_CARE_PROVIDER_SITE_OTHER): Payer: Medicare Other | Admitting: *Deleted

## 2014-08-31 DIAGNOSIS — I4891 Unspecified atrial fibrillation: Secondary | ICD-10-CM

## 2014-08-31 DIAGNOSIS — Z5181 Encounter for therapeutic drug level monitoring: Secondary | ICD-10-CM

## 2014-09-01 ENCOUNTER — Ambulatory Visit (HOSPITAL_COMMUNITY): Payer: Medicare Other

## 2014-09-14 ENCOUNTER — Other Ambulatory Visit: Payer: Self-pay | Admitting: Internal Medicine

## 2014-09-14 MED ORDER — TERAZOSIN HCL 5 MG PO CAPS: 5.0000 mg | ORAL_CAPSULE | Freq: Every day | ORAL | Status: DC

## 2014-09-14 NOTE — Telephone Encounter (Signed)
Received fax refill request  Rx # [id number]  Medication:  Terazosin 5 mg capsule Qty 30 Sig:  Take one capsule by mouth at bedtime Physician:  Harrington Challenger

## 2014-09-14 NOTE — Telephone Encounter (Signed)
Medication sent to pharmacy  

## 2014-09-21 ENCOUNTER — Other Ambulatory Visit: Payer: Self-pay | Admitting: *Deleted

## 2014-09-21 DIAGNOSIS — Z5181 Encounter for therapeutic drug level monitoring: Secondary | ICD-10-CM | POA: Diagnosis not present

## 2014-09-21 DIAGNOSIS — I4891 Unspecified atrial fibrillation: Secondary | ICD-10-CM | POA: Diagnosis not present

## 2014-09-21 DIAGNOSIS — I480 Paroxysmal atrial fibrillation: Secondary | ICD-10-CM

## 2014-09-21 LAB — POCT INR: INR: 3.01

## 2014-09-27 ENCOUNTER — Telehealth: Payer: Self-pay | Admitting: *Deleted

## 2014-09-27 ENCOUNTER — Ambulatory Visit (INDEPENDENT_AMBULATORY_CARE_PROVIDER_SITE_OTHER): Payer: PRIVATE HEALTH INSURANCE | Admitting: *Deleted

## 2014-09-27 DIAGNOSIS — I480 Paroxysmal atrial fibrillation: Secondary | ICD-10-CM

## 2014-09-27 DIAGNOSIS — Z5181 Encounter for therapeutic drug level monitoring: Secondary | ICD-10-CM

## 2014-09-27 NOTE — Telephone Encounter (Signed)
Patient wants return phone call regarding INR results / tgs

## 2014-09-27 NOTE — Telephone Encounter (Signed)
Called.  See coumadin note. 

## 2014-10-11 DIAGNOSIS — I10 Essential (primary) hypertension: Secondary | ICD-10-CM | POA: Diagnosis not present

## 2014-10-11 DIAGNOSIS — M6281 Muscle weakness (generalized): Secondary | ICD-10-CM | POA: Diagnosis not present

## 2014-10-11 DIAGNOSIS — Z789 Other specified health status: Secondary | ICD-10-CM | POA: Diagnosis not present

## 2014-10-11 DIAGNOSIS — R2681 Unsteadiness on feet: Secondary | ICD-10-CM | POA: Diagnosis not present

## 2014-10-11 DIAGNOSIS — R41841 Cognitive communication deficit: Secondary | ICD-10-CM | POA: Diagnosis not present

## 2014-10-11 DIAGNOSIS — R262 Difficulty in walking, not elsewhere classified: Secondary | ICD-10-CM | POA: Diagnosis not present

## 2014-10-11 DIAGNOSIS — Z7901 Long term (current) use of anticoagulants: Secondary | ICD-10-CM | POA: Diagnosis not present

## 2014-10-11 DIAGNOSIS — N289 Disorder of kidney and ureter, unspecified: Secondary | ICD-10-CM | POA: Diagnosis not present

## 2014-10-11 DIAGNOSIS — D649 Anemia, unspecified: Secondary | ICD-10-CM | POA: Diagnosis not present

## 2014-10-11 DIAGNOSIS — I739 Peripheral vascular disease, unspecified: Secondary | ICD-10-CM | POA: Diagnosis not present

## 2014-10-11 DIAGNOSIS — E785 Hyperlipidemia, unspecified: Secondary | ICD-10-CM | POA: Diagnosis not present

## 2014-10-11 DIAGNOSIS — Z79899 Other long term (current) drug therapy: Secondary | ICD-10-CM | POA: Diagnosis not present

## 2014-10-11 DIAGNOSIS — R0602 Shortness of breath: Secondary | ICD-10-CM | POA: Diagnosis not present

## 2014-10-11 DIAGNOSIS — M25569 Pain in unspecified knee: Secondary | ICD-10-CM | POA: Diagnosis not present

## 2014-10-11 DIAGNOSIS — D72829 Elevated white blood cell count, unspecified: Secondary | ICD-10-CM | POA: Diagnosis not present

## 2014-10-11 DIAGNOSIS — E559 Vitamin D deficiency, unspecified: Secondary | ICD-10-CM | POA: Diagnosis not present

## 2014-10-11 DIAGNOSIS — E119 Type 2 diabetes mellitus without complications: Secondary | ICD-10-CM | POA: Diagnosis not present

## 2014-10-11 DIAGNOSIS — E039 Hypothyroidism, unspecified: Secondary | ICD-10-CM | POA: Diagnosis not present

## 2014-10-11 DIAGNOSIS — I4891 Unspecified atrial fibrillation: Secondary | ICD-10-CM | POA: Diagnosis not present

## 2014-10-12 DIAGNOSIS — I4891 Unspecified atrial fibrillation: Secondary | ICD-10-CM | POA: Diagnosis not present

## 2014-10-12 DIAGNOSIS — I10 Essential (primary) hypertension: Secondary | ICD-10-CM | POA: Diagnosis not present

## 2014-10-12 DIAGNOSIS — E119 Type 2 diabetes mellitus without complications: Secondary | ICD-10-CM | POA: Diagnosis not present

## 2014-10-12 DIAGNOSIS — Z789 Other specified health status: Secondary | ICD-10-CM | POA: Diagnosis not present

## 2014-10-12 DIAGNOSIS — M25569 Pain in unspecified knee: Secondary | ICD-10-CM | POA: Diagnosis not present

## 2014-10-12 DIAGNOSIS — D72829 Elevated white blood cell count, unspecified: Secondary | ICD-10-CM | POA: Diagnosis not present

## 2014-10-13 DIAGNOSIS — R319 Hematuria, unspecified: Secondary | ICD-10-CM | POA: Diagnosis not present

## 2014-10-13 DIAGNOSIS — R300 Dysuria: Secondary | ICD-10-CM | POA: Diagnosis not present

## 2014-10-14 DIAGNOSIS — I4891 Unspecified atrial fibrillation: Secondary | ICD-10-CM | POA: Diagnosis not present

## 2014-10-14 DIAGNOSIS — D72829 Elevated white blood cell count, unspecified: Secondary | ICD-10-CM | POA: Diagnosis not present

## 2014-10-14 DIAGNOSIS — I10 Essential (primary) hypertension: Secondary | ICD-10-CM | POA: Diagnosis not present

## 2014-10-14 DIAGNOSIS — I739 Peripheral vascular disease, unspecified: Secondary | ICD-10-CM | POA: Diagnosis not present

## 2014-10-14 DIAGNOSIS — E119 Type 2 diabetes mellitus without complications: Secondary | ICD-10-CM | POA: Diagnosis not present

## 2014-10-14 DIAGNOSIS — Z789 Other specified health status: Secondary | ICD-10-CM | POA: Diagnosis not present

## 2014-10-14 DIAGNOSIS — Z7901 Long term (current) use of anticoagulants: Secondary | ICD-10-CM | POA: Diagnosis not present

## 2014-10-14 DIAGNOSIS — M25569 Pain in unspecified knee: Secondary | ICD-10-CM | POA: Diagnosis not present

## 2014-10-17 DIAGNOSIS — Z7901 Long term (current) use of anticoagulants: Secondary | ICD-10-CM | POA: Diagnosis not present

## 2014-10-17 DIAGNOSIS — I4891 Unspecified atrial fibrillation: Secondary | ICD-10-CM | POA: Diagnosis not present

## 2014-10-18 DIAGNOSIS — I4891 Unspecified atrial fibrillation: Secondary | ICD-10-CM | POA: Diagnosis not present

## 2014-10-18 DIAGNOSIS — I10 Essential (primary) hypertension: Secondary | ICD-10-CM | POA: Diagnosis not present

## 2014-10-18 DIAGNOSIS — E119 Type 2 diabetes mellitus without complications: Secondary | ICD-10-CM | POA: Diagnosis not present

## 2014-10-21 DIAGNOSIS — E119 Type 2 diabetes mellitus without complications: Secondary | ICD-10-CM | POA: Diagnosis not present

## 2014-10-21 DIAGNOSIS — Z7901 Long term (current) use of anticoagulants: Secondary | ICD-10-CM | POA: Diagnosis not present

## 2014-10-21 DIAGNOSIS — I4891 Unspecified atrial fibrillation: Secondary | ICD-10-CM | POA: Diagnosis not present

## 2014-10-21 DIAGNOSIS — I10 Essential (primary) hypertension: Secondary | ICD-10-CM | POA: Diagnosis not present

## 2014-10-26 DIAGNOSIS — Z7901 Long term (current) use of anticoagulants: Secondary | ICD-10-CM | POA: Diagnosis not present

## 2014-10-26 DIAGNOSIS — I4891 Unspecified atrial fibrillation: Secondary | ICD-10-CM | POA: Diagnosis not present

## 2014-10-29 DIAGNOSIS — I4891 Unspecified atrial fibrillation: Secondary | ICD-10-CM | POA: Diagnosis not present

## 2014-10-29 DIAGNOSIS — I10 Essential (primary) hypertension: Secondary | ICD-10-CM | POA: Diagnosis not present

## 2014-10-29 DIAGNOSIS — Z7901 Long term (current) use of anticoagulants: Secondary | ICD-10-CM | POA: Diagnosis not present

## 2014-10-29 DIAGNOSIS — E119 Type 2 diabetes mellitus without complications: Secondary | ICD-10-CM | POA: Diagnosis not present

## 2014-11-01 DIAGNOSIS — I10 Essential (primary) hypertension: Secondary | ICD-10-CM | POA: Diagnosis not present

## 2014-11-01 DIAGNOSIS — I4891 Unspecified atrial fibrillation: Secondary | ICD-10-CM | POA: Diagnosis not present

## 2014-11-01 DIAGNOSIS — Z7901 Long term (current) use of anticoagulants: Secondary | ICD-10-CM | POA: Diagnosis not present

## 2014-11-01 DIAGNOSIS — I739 Peripheral vascular disease, unspecified: Secondary | ICD-10-CM | POA: Diagnosis not present

## 2014-11-07 DIAGNOSIS — I4891 Unspecified atrial fibrillation: Secondary | ICD-10-CM | POA: Diagnosis not present

## 2014-11-07 DIAGNOSIS — I10 Essential (primary) hypertension: Secondary | ICD-10-CM | POA: Diagnosis not present

## 2014-11-07 DIAGNOSIS — I739 Peripheral vascular disease, unspecified: Secondary | ICD-10-CM | POA: Diagnosis not present

## 2014-11-07 DIAGNOSIS — Z7901 Long term (current) use of anticoagulants: Secondary | ICD-10-CM | POA: Diagnosis not present

## 2014-11-11 DIAGNOSIS — R2681 Unsteadiness on feet: Secondary | ICD-10-CM | POA: Diagnosis not present

## 2014-11-11 DIAGNOSIS — I10 Essential (primary) hypertension: Secondary | ICD-10-CM | POA: Diagnosis not present

## 2014-11-11 DIAGNOSIS — E119 Type 2 diabetes mellitus without complications: Secondary | ICD-10-CM | POA: Diagnosis not present

## 2014-11-11 DIAGNOSIS — R41841 Cognitive communication deficit: Secondary | ICD-10-CM | POA: Diagnosis not present

## 2014-11-11 DIAGNOSIS — M6281 Muscle weakness (generalized): Secondary | ICD-10-CM | POA: Diagnosis not present

## 2014-11-11 DIAGNOSIS — R262 Difficulty in walking, not elsewhere classified: Secondary | ICD-10-CM | POA: Diagnosis not present

## 2014-11-11 DIAGNOSIS — D649 Anemia, unspecified: Secondary | ICD-10-CM | POA: Diagnosis not present

## 2014-11-11 DIAGNOSIS — Z79899 Other long term (current) drug therapy: Secondary | ICD-10-CM | POA: Diagnosis not present

## 2014-11-11 DIAGNOSIS — I739 Peripheral vascular disease, unspecified: Secondary | ICD-10-CM | POA: Diagnosis not present

## 2014-11-12 DIAGNOSIS — R41841 Cognitive communication deficit: Secondary | ICD-10-CM | POA: Diagnosis not present

## 2014-11-12 DIAGNOSIS — R262 Difficulty in walking, not elsewhere classified: Secondary | ICD-10-CM | POA: Diagnosis not present

## 2014-11-12 DIAGNOSIS — M6281 Muscle weakness (generalized): Secondary | ICD-10-CM | POA: Diagnosis not present

## 2014-11-12 DIAGNOSIS — E119 Type 2 diabetes mellitus without complications: Secondary | ICD-10-CM | POA: Diagnosis not present

## 2014-11-12 DIAGNOSIS — R2681 Unsteadiness on feet: Secondary | ICD-10-CM | POA: Diagnosis not present

## 2014-11-13 DIAGNOSIS — M6281 Muscle weakness (generalized): Secondary | ICD-10-CM | POA: Diagnosis not present

## 2014-11-13 DIAGNOSIS — R41841 Cognitive communication deficit: Secondary | ICD-10-CM | POA: Diagnosis not present

## 2014-11-13 DIAGNOSIS — E119 Type 2 diabetes mellitus without complications: Secondary | ICD-10-CM | POA: Diagnosis not present

## 2014-11-13 DIAGNOSIS — R262 Difficulty in walking, not elsewhere classified: Secondary | ICD-10-CM | POA: Diagnosis not present

## 2014-11-13 DIAGNOSIS — R2681 Unsteadiness on feet: Secondary | ICD-10-CM | POA: Diagnosis not present

## 2014-11-14 DIAGNOSIS — M25569 Pain in unspecified knee: Secondary | ICD-10-CM | POA: Diagnosis not present

## 2014-11-14 DIAGNOSIS — R531 Weakness: Secondary | ICD-10-CM | POA: Diagnosis not present

## 2014-11-14 DIAGNOSIS — R262 Difficulty in walking, not elsewhere classified: Secondary | ICD-10-CM | POA: Diagnosis not present

## 2014-11-14 DIAGNOSIS — Z7901 Long term (current) use of anticoagulants: Secondary | ICD-10-CM | POA: Diagnosis not present

## 2014-11-14 DIAGNOSIS — R2681 Unsteadiness on feet: Secondary | ICD-10-CM | POA: Diagnosis not present

## 2014-11-14 DIAGNOSIS — I4891 Unspecified atrial fibrillation: Secondary | ICD-10-CM | POA: Diagnosis not present

## 2014-11-14 DIAGNOSIS — M6281 Muscle weakness (generalized): Secondary | ICD-10-CM | POA: Diagnosis not present

## 2014-11-14 DIAGNOSIS — E119 Type 2 diabetes mellitus without complications: Secondary | ICD-10-CM | POA: Diagnosis not present

## 2014-11-14 DIAGNOSIS — R41841 Cognitive communication deficit: Secondary | ICD-10-CM | POA: Diagnosis not present

## 2014-11-15 DIAGNOSIS — R2681 Unsteadiness on feet: Secondary | ICD-10-CM | POA: Diagnosis not present

## 2014-11-15 DIAGNOSIS — R262 Difficulty in walking, not elsewhere classified: Secondary | ICD-10-CM | POA: Diagnosis not present

## 2014-11-15 DIAGNOSIS — E119 Type 2 diabetes mellitus without complications: Secondary | ICD-10-CM | POA: Diagnosis not present

## 2014-11-15 DIAGNOSIS — M6281 Muscle weakness (generalized): Secondary | ICD-10-CM | POA: Diagnosis not present

## 2014-11-15 DIAGNOSIS — R41841 Cognitive communication deficit: Secondary | ICD-10-CM | POA: Diagnosis not present

## 2014-11-16 DIAGNOSIS — R262 Difficulty in walking, not elsewhere classified: Secondary | ICD-10-CM | POA: Diagnosis not present

## 2014-11-16 DIAGNOSIS — R2681 Unsteadiness on feet: Secondary | ICD-10-CM | POA: Diagnosis not present

## 2014-11-16 DIAGNOSIS — E119 Type 2 diabetes mellitus without complications: Secondary | ICD-10-CM | POA: Diagnosis not present

## 2014-11-16 DIAGNOSIS — M6281 Muscle weakness (generalized): Secondary | ICD-10-CM | POA: Diagnosis not present

## 2014-11-16 DIAGNOSIS — R41841 Cognitive communication deficit: Secondary | ICD-10-CM | POA: Diagnosis not present

## 2014-11-17 DIAGNOSIS — M6281 Muscle weakness (generalized): Secondary | ICD-10-CM | POA: Diagnosis not present

## 2014-11-17 DIAGNOSIS — R2681 Unsteadiness on feet: Secondary | ICD-10-CM | POA: Diagnosis not present

## 2014-11-17 DIAGNOSIS — E119 Type 2 diabetes mellitus without complications: Secondary | ICD-10-CM | POA: Diagnosis not present

## 2014-11-17 DIAGNOSIS — R262 Difficulty in walking, not elsewhere classified: Secondary | ICD-10-CM | POA: Diagnosis not present

## 2014-11-17 DIAGNOSIS — R41841 Cognitive communication deficit: Secondary | ICD-10-CM | POA: Diagnosis not present

## 2014-11-18 DIAGNOSIS — R2681 Unsteadiness on feet: Secondary | ICD-10-CM | POA: Diagnosis not present

## 2014-11-18 DIAGNOSIS — R262 Difficulty in walking, not elsewhere classified: Secondary | ICD-10-CM | POA: Diagnosis not present

## 2014-11-18 DIAGNOSIS — E119 Type 2 diabetes mellitus without complications: Secondary | ICD-10-CM | POA: Diagnosis not present

## 2014-11-18 DIAGNOSIS — M6281 Muscle weakness (generalized): Secondary | ICD-10-CM | POA: Diagnosis not present

## 2014-11-18 DIAGNOSIS — R41841 Cognitive communication deficit: Secondary | ICD-10-CM | POA: Diagnosis not present

## 2014-11-21 DIAGNOSIS — R2681 Unsteadiness on feet: Secondary | ICD-10-CM | POA: Diagnosis not present

## 2014-11-21 DIAGNOSIS — R41841 Cognitive communication deficit: Secondary | ICD-10-CM | POA: Diagnosis not present

## 2014-11-21 DIAGNOSIS — I4891 Unspecified atrial fibrillation: Secondary | ICD-10-CM | POA: Diagnosis not present

## 2014-11-21 DIAGNOSIS — E119 Type 2 diabetes mellitus without complications: Secondary | ICD-10-CM | POA: Diagnosis not present

## 2014-11-21 DIAGNOSIS — R262 Difficulty in walking, not elsewhere classified: Secondary | ICD-10-CM | POA: Diagnosis not present

## 2014-11-21 DIAGNOSIS — M6281 Muscle weakness (generalized): Secondary | ICD-10-CM | POA: Diagnosis not present

## 2014-11-21 DIAGNOSIS — Z7901 Long term (current) use of anticoagulants: Secondary | ICD-10-CM | POA: Diagnosis not present

## 2014-11-22 DIAGNOSIS — M6281 Muscle weakness (generalized): Secondary | ICD-10-CM | POA: Diagnosis not present

## 2014-11-22 DIAGNOSIS — E119 Type 2 diabetes mellitus without complications: Secondary | ICD-10-CM | POA: Diagnosis not present

## 2014-11-22 DIAGNOSIS — R262 Difficulty in walking, not elsewhere classified: Secondary | ICD-10-CM | POA: Diagnosis not present

## 2014-11-22 DIAGNOSIS — R2681 Unsteadiness on feet: Secondary | ICD-10-CM | POA: Diagnosis not present

## 2014-11-22 DIAGNOSIS — R41841 Cognitive communication deficit: Secondary | ICD-10-CM | POA: Diagnosis not present

## 2014-11-23 DIAGNOSIS — E119 Type 2 diabetes mellitus without complications: Secondary | ICD-10-CM | POA: Diagnosis not present

## 2014-11-23 DIAGNOSIS — M6281 Muscle weakness (generalized): Secondary | ICD-10-CM | POA: Diagnosis not present

## 2014-11-23 DIAGNOSIS — R2681 Unsteadiness on feet: Secondary | ICD-10-CM | POA: Diagnosis not present

## 2014-11-23 DIAGNOSIS — R262 Difficulty in walking, not elsewhere classified: Secondary | ICD-10-CM | POA: Diagnosis not present

## 2014-11-23 DIAGNOSIS — R41841 Cognitive communication deficit: Secondary | ICD-10-CM | POA: Diagnosis not present

## 2014-11-24 DIAGNOSIS — R262 Difficulty in walking, not elsewhere classified: Secondary | ICD-10-CM | POA: Diagnosis not present

## 2014-11-24 DIAGNOSIS — R2681 Unsteadiness on feet: Secondary | ICD-10-CM | POA: Diagnosis not present

## 2014-11-24 DIAGNOSIS — E119 Type 2 diabetes mellitus without complications: Secondary | ICD-10-CM | POA: Diagnosis not present

## 2014-11-24 DIAGNOSIS — M6281 Muscle weakness (generalized): Secondary | ICD-10-CM | POA: Diagnosis not present

## 2014-11-24 DIAGNOSIS — R41841 Cognitive communication deficit: Secondary | ICD-10-CM | POA: Diagnosis not present

## 2014-11-25 DIAGNOSIS — R41841 Cognitive communication deficit: Secondary | ICD-10-CM | POA: Diagnosis not present

## 2014-11-25 DIAGNOSIS — R262 Difficulty in walking, not elsewhere classified: Secondary | ICD-10-CM | POA: Diagnosis not present

## 2014-11-25 DIAGNOSIS — E119 Type 2 diabetes mellitus without complications: Secondary | ICD-10-CM | POA: Diagnosis not present

## 2014-11-25 DIAGNOSIS — R2681 Unsteadiness on feet: Secondary | ICD-10-CM | POA: Diagnosis not present

## 2014-11-25 DIAGNOSIS — M6281 Muscle weakness (generalized): Secondary | ICD-10-CM | POA: Diagnosis not present

## 2014-11-28 DIAGNOSIS — M6281 Muscle weakness (generalized): Secondary | ICD-10-CM | POA: Diagnosis not present

## 2014-11-28 DIAGNOSIS — Z7901 Long term (current) use of anticoagulants: Secondary | ICD-10-CM | POA: Diagnosis not present

## 2014-11-28 DIAGNOSIS — R262 Difficulty in walking, not elsewhere classified: Secondary | ICD-10-CM | POA: Diagnosis not present

## 2014-11-28 DIAGNOSIS — E119 Type 2 diabetes mellitus without complications: Secondary | ICD-10-CM | POA: Diagnosis not present

## 2014-11-28 DIAGNOSIS — I4891 Unspecified atrial fibrillation: Secondary | ICD-10-CM | POA: Diagnosis not present

## 2014-11-28 DIAGNOSIS — R41841 Cognitive communication deficit: Secondary | ICD-10-CM | POA: Diagnosis not present

## 2014-11-28 DIAGNOSIS — R2681 Unsteadiness on feet: Secondary | ICD-10-CM | POA: Diagnosis not present

## 2014-11-29 DIAGNOSIS — R262 Difficulty in walking, not elsewhere classified: Secondary | ICD-10-CM | POA: Diagnosis not present

## 2014-11-29 DIAGNOSIS — R41841 Cognitive communication deficit: Secondary | ICD-10-CM | POA: Diagnosis not present

## 2014-11-29 DIAGNOSIS — M6281 Muscle weakness (generalized): Secondary | ICD-10-CM | POA: Diagnosis not present

## 2014-11-29 DIAGNOSIS — R2681 Unsteadiness on feet: Secondary | ICD-10-CM | POA: Diagnosis not present

## 2014-11-29 DIAGNOSIS — E119 Type 2 diabetes mellitus without complications: Secondary | ICD-10-CM | POA: Diagnosis not present

## 2014-11-30 DIAGNOSIS — R2681 Unsteadiness on feet: Secondary | ICD-10-CM | POA: Diagnosis not present

## 2014-11-30 DIAGNOSIS — I4891 Unspecified atrial fibrillation: Secondary | ICD-10-CM | POA: Diagnosis not present

## 2014-11-30 DIAGNOSIS — R262 Difficulty in walking, not elsewhere classified: Secondary | ICD-10-CM | POA: Diagnosis not present

## 2014-11-30 DIAGNOSIS — M6281 Muscle weakness (generalized): Secondary | ICD-10-CM | POA: Diagnosis not present

## 2014-11-30 DIAGNOSIS — M25569 Pain in unspecified knee: Secondary | ICD-10-CM | POA: Diagnosis not present

## 2014-11-30 DIAGNOSIS — I10 Essential (primary) hypertension: Secondary | ICD-10-CM | POA: Diagnosis not present

## 2014-11-30 DIAGNOSIS — E119 Type 2 diabetes mellitus without complications: Secondary | ICD-10-CM | POA: Diagnosis not present

## 2014-11-30 DIAGNOSIS — R41841 Cognitive communication deficit: Secondary | ICD-10-CM | POA: Diagnosis not present

## 2014-12-01 DIAGNOSIS — R262 Difficulty in walking, not elsewhere classified: Secondary | ICD-10-CM | POA: Diagnosis not present

## 2014-12-01 DIAGNOSIS — R2681 Unsteadiness on feet: Secondary | ICD-10-CM | POA: Diagnosis not present

## 2014-12-01 DIAGNOSIS — M6281 Muscle weakness (generalized): Secondary | ICD-10-CM | POA: Diagnosis not present

## 2014-12-01 DIAGNOSIS — R41841 Cognitive communication deficit: Secondary | ICD-10-CM | POA: Diagnosis not present

## 2014-12-01 DIAGNOSIS — E119 Type 2 diabetes mellitus without complications: Secondary | ICD-10-CM | POA: Diagnosis not present

## 2014-12-02 DIAGNOSIS — R2681 Unsteadiness on feet: Secondary | ICD-10-CM | POA: Diagnosis not present

## 2014-12-02 DIAGNOSIS — M6281 Muscle weakness (generalized): Secondary | ICD-10-CM | POA: Diagnosis not present

## 2014-12-02 DIAGNOSIS — E119 Type 2 diabetes mellitus without complications: Secondary | ICD-10-CM | POA: Diagnosis not present

## 2014-12-02 DIAGNOSIS — R41841 Cognitive communication deficit: Secondary | ICD-10-CM | POA: Diagnosis not present

## 2014-12-02 DIAGNOSIS — R262 Difficulty in walking, not elsewhere classified: Secondary | ICD-10-CM | POA: Diagnosis not present

## 2014-12-05 DIAGNOSIS — Z7901 Long term (current) use of anticoagulants: Secondary | ICD-10-CM | POA: Diagnosis not present

## 2014-12-05 DIAGNOSIS — R41841 Cognitive communication deficit: Secondary | ICD-10-CM | POA: Diagnosis not present

## 2014-12-05 DIAGNOSIS — R2681 Unsteadiness on feet: Secondary | ICD-10-CM | POA: Diagnosis not present

## 2014-12-05 DIAGNOSIS — M6281 Muscle weakness (generalized): Secondary | ICD-10-CM | POA: Diagnosis not present

## 2014-12-05 DIAGNOSIS — R531 Weakness: Secondary | ICD-10-CM | POA: Diagnosis not present

## 2014-12-05 DIAGNOSIS — E119 Type 2 diabetes mellitus without complications: Secondary | ICD-10-CM | POA: Diagnosis not present

## 2014-12-05 DIAGNOSIS — I4891 Unspecified atrial fibrillation: Secondary | ICD-10-CM | POA: Diagnosis not present

## 2014-12-05 DIAGNOSIS — R262 Difficulty in walking, not elsewhere classified: Secondary | ICD-10-CM | POA: Diagnosis not present

## 2014-12-06 DIAGNOSIS — R262 Difficulty in walking, not elsewhere classified: Secondary | ICD-10-CM | POA: Diagnosis not present

## 2014-12-06 DIAGNOSIS — E119 Type 2 diabetes mellitus without complications: Secondary | ICD-10-CM | POA: Diagnosis not present

## 2014-12-06 DIAGNOSIS — R41841 Cognitive communication deficit: Secondary | ICD-10-CM | POA: Diagnosis not present

## 2014-12-06 DIAGNOSIS — M6281 Muscle weakness (generalized): Secondary | ICD-10-CM | POA: Diagnosis not present

## 2014-12-06 DIAGNOSIS — R2681 Unsteadiness on feet: Secondary | ICD-10-CM | POA: Diagnosis not present

## 2014-12-07 DIAGNOSIS — R41841 Cognitive communication deficit: Secondary | ICD-10-CM | POA: Diagnosis not present

## 2014-12-07 DIAGNOSIS — R2681 Unsteadiness on feet: Secondary | ICD-10-CM | POA: Diagnosis not present

## 2014-12-07 DIAGNOSIS — R262 Difficulty in walking, not elsewhere classified: Secondary | ICD-10-CM | POA: Diagnosis not present

## 2014-12-07 DIAGNOSIS — E119 Type 2 diabetes mellitus without complications: Secondary | ICD-10-CM | POA: Diagnosis not present

## 2014-12-07 DIAGNOSIS — M6281 Muscle weakness (generalized): Secondary | ICD-10-CM | POA: Diagnosis not present

## 2014-12-08 DIAGNOSIS — R262 Difficulty in walking, not elsewhere classified: Secondary | ICD-10-CM | POA: Diagnosis not present

## 2014-12-08 DIAGNOSIS — M6281 Muscle weakness (generalized): Secondary | ICD-10-CM | POA: Diagnosis not present

## 2014-12-08 DIAGNOSIS — R2681 Unsteadiness on feet: Secondary | ICD-10-CM | POA: Diagnosis not present

## 2014-12-08 DIAGNOSIS — E119 Type 2 diabetes mellitus without complications: Secondary | ICD-10-CM | POA: Diagnosis not present

## 2014-12-08 DIAGNOSIS — R41841 Cognitive communication deficit: Secondary | ICD-10-CM | POA: Diagnosis not present

## 2014-12-10 DIAGNOSIS — R41841 Cognitive communication deficit: Secondary | ICD-10-CM | POA: Diagnosis not present

## 2014-12-10 DIAGNOSIS — M6281 Muscle weakness (generalized): Secondary | ICD-10-CM | POA: Diagnosis not present

## 2014-12-10 DIAGNOSIS — E119 Type 2 diabetes mellitus without complications: Secondary | ICD-10-CM | POA: Diagnosis not present

## 2014-12-10 DIAGNOSIS — R2681 Unsteadiness on feet: Secondary | ICD-10-CM | POA: Diagnosis not present

## 2014-12-10 DIAGNOSIS — R262 Difficulty in walking, not elsewhere classified: Secondary | ICD-10-CM | POA: Diagnosis not present

## 2014-12-11 DIAGNOSIS — I4891 Unspecified atrial fibrillation: Secondary | ICD-10-CM | POA: Diagnosis not present

## 2014-12-11 DIAGNOSIS — E039 Hypothyroidism, unspecified: Secondary | ICD-10-CM | POA: Diagnosis not present

## 2014-12-11 DIAGNOSIS — E119 Type 2 diabetes mellitus without complications: Secondary | ICD-10-CM | POA: Diagnosis not present

## 2014-12-11 DIAGNOSIS — I10 Essential (primary) hypertension: Secondary | ICD-10-CM | POA: Diagnosis not present

## 2014-12-12 DIAGNOSIS — R262 Difficulty in walking, not elsewhere classified: Secondary | ICD-10-CM | POA: Diagnosis not present

## 2014-12-12 DIAGNOSIS — M6281 Muscle weakness (generalized): Secondary | ICD-10-CM | POA: Diagnosis not present

## 2014-12-12 DIAGNOSIS — D649 Anemia, unspecified: Secondary | ICD-10-CM | POA: Diagnosis not present

## 2014-12-12 DIAGNOSIS — I739 Peripheral vascular disease, unspecified: Secondary | ICD-10-CM | POA: Diagnosis not present

## 2014-12-12 DIAGNOSIS — E119 Type 2 diabetes mellitus without complications: Secondary | ICD-10-CM | POA: Diagnosis not present

## 2014-12-12 DIAGNOSIS — R2681 Unsteadiness on feet: Secondary | ICD-10-CM | POA: Diagnosis not present

## 2014-12-12 DIAGNOSIS — I4891 Unspecified atrial fibrillation: Secondary | ICD-10-CM | POA: Diagnosis not present

## 2014-12-12 DIAGNOSIS — R41841 Cognitive communication deficit: Secondary | ICD-10-CM | POA: Diagnosis not present

## 2014-12-12 DIAGNOSIS — Z7901 Long term (current) use of anticoagulants: Secondary | ICD-10-CM | POA: Diagnosis not present

## 2014-12-12 DIAGNOSIS — R531 Weakness: Secondary | ICD-10-CM | POA: Diagnosis not present

## 2014-12-12 DIAGNOSIS — Z79899 Other long term (current) drug therapy: Secondary | ICD-10-CM | POA: Diagnosis not present

## 2014-12-13 DIAGNOSIS — M6281 Muscle weakness (generalized): Secondary | ICD-10-CM | POA: Diagnosis not present

## 2014-12-13 DIAGNOSIS — R262 Difficulty in walking, not elsewhere classified: Secondary | ICD-10-CM | POA: Diagnosis not present

## 2014-12-13 DIAGNOSIS — R2681 Unsteadiness on feet: Secondary | ICD-10-CM | POA: Diagnosis not present

## 2014-12-13 DIAGNOSIS — R41841 Cognitive communication deficit: Secondary | ICD-10-CM | POA: Diagnosis not present

## 2014-12-13 DIAGNOSIS — E119 Type 2 diabetes mellitus without complications: Secondary | ICD-10-CM | POA: Diagnosis not present

## 2014-12-14 DIAGNOSIS — E119 Type 2 diabetes mellitus without complications: Secondary | ICD-10-CM | POA: Diagnosis not present

## 2014-12-14 DIAGNOSIS — R2681 Unsteadiness on feet: Secondary | ICD-10-CM | POA: Diagnosis not present

## 2014-12-14 DIAGNOSIS — R262 Difficulty in walking, not elsewhere classified: Secondary | ICD-10-CM | POA: Diagnosis not present

## 2014-12-14 DIAGNOSIS — R41841 Cognitive communication deficit: Secondary | ICD-10-CM | POA: Diagnosis not present

## 2014-12-14 DIAGNOSIS — M6281 Muscle weakness (generalized): Secondary | ICD-10-CM | POA: Diagnosis not present

## 2014-12-15 DIAGNOSIS — M6281 Muscle weakness (generalized): Secondary | ICD-10-CM | POA: Diagnosis not present

## 2014-12-15 DIAGNOSIS — R41841 Cognitive communication deficit: Secondary | ICD-10-CM | POA: Diagnosis not present

## 2014-12-15 DIAGNOSIS — R262 Difficulty in walking, not elsewhere classified: Secondary | ICD-10-CM | POA: Diagnosis not present

## 2014-12-15 DIAGNOSIS — R2681 Unsteadiness on feet: Secondary | ICD-10-CM | POA: Diagnosis not present

## 2014-12-15 DIAGNOSIS — E119 Type 2 diabetes mellitus without complications: Secondary | ICD-10-CM | POA: Diagnosis not present

## 2014-12-16 DIAGNOSIS — R41841 Cognitive communication deficit: Secondary | ICD-10-CM | POA: Diagnosis not present

## 2014-12-16 DIAGNOSIS — E119 Type 2 diabetes mellitus without complications: Secondary | ICD-10-CM | POA: Diagnosis not present

## 2014-12-16 DIAGNOSIS — R2681 Unsteadiness on feet: Secondary | ICD-10-CM | POA: Diagnosis not present

## 2014-12-16 DIAGNOSIS — R262 Difficulty in walking, not elsewhere classified: Secondary | ICD-10-CM | POA: Diagnosis not present

## 2014-12-16 DIAGNOSIS — M6281 Muscle weakness (generalized): Secondary | ICD-10-CM | POA: Diagnosis not present

## 2014-12-19 DIAGNOSIS — M6281 Muscle weakness (generalized): Secondary | ICD-10-CM | POA: Diagnosis not present

## 2014-12-19 DIAGNOSIS — R2681 Unsteadiness on feet: Secondary | ICD-10-CM | POA: Diagnosis not present

## 2014-12-19 DIAGNOSIS — R262 Difficulty in walking, not elsewhere classified: Secondary | ICD-10-CM | POA: Diagnosis not present

## 2014-12-19 DIAGNOSIS — R41841 Cognitive communication deficit: Secondary | ICD-10-CM | POA: Diagnosis not present

## 2014-12-19 DIAGNOSIS — E119 Type 2 diabetes mellitus without complications: Secondary | ICD-10-CM | POA: Diagnosis not present

## 2014-12-20 DIAGNOSIS — R41841 Cognitive communication deficit: Secondary | ICD-10-CM | POA: Diagnosis not present

## 2014-12-20 DIAGNOSIS — E119 Type 2 diabetes mellitus without complications: Secondary | ICD-10-CM | POA: Diagnosis not present

## 2014-12-20 DIAGNOSIS — M6281 Muscle weakness (generalized): Secondary | ICD-10-CM | POA: Diagnosis not present

## 2014-12-20 DIAGNOSIS — R262 Difficulty in walking, not elsewhere classified: Secondary | ICD-10-CM | POA: Diagnosis not present

## 2014-12-20 DIAGNOSIS — R2681 Unsteadiness on feet: Secondary | ICD-10-CM | POA: Diagnosis not present

## 2014-12-21 DIAGNOSIS — R262 Difficulty in walking, not elsewhere classified: Secondary | ICD-10-CM | POA: Diagnosis not present

## 2014-12-21 DIAGNOSIS — E119 Type 2 diabetes mellitus without complications: Secondary | ICD-10-CM | POA: Diagnosis not present

## 2014-12-21 DIAGNOSIS — M6281 Muscle weakness (generalized): Secondary | ICD-10-CM | POA: Diagnosis not present

## 2014-12-21 DIAGNOSIS — R2681 Unsteadiness on feet: Secondary | ICD-10-CM | POA: Diagnosis not present

## 2014-12-21 DIAGNOSIS — R41841 Cognitive communication deficit: Secondary | ICD-10-CM | POA: Diagnosis not present

## 2014-12-22 DIAGNOSIS — M6281 Muscle weakness (generalized): Secondary | ICD-10-CM | POA: Diagnosis not present

## 2014-12-22 DIAGNOSIS — I10 Essential (primary) hypertension: Secondary | ICD-10-CM | POA: Diagnosis not present

## 2014-12-22 DIAGNOSIS — R2681 Unsteadiness on feet: Secondary | ICD-10-CM | POA: Diagnosis not present

## 2014-12-22 DIAGNOSIS — E039 Hypothyroidism, unspecified: Secondary | ICD-10-CM | POA: Diagnosis not present

## 2014-12-22 DIAGNOSIS — R41841 Cognitive communication deficit: Secondary | ICD-10-CM | POA: Diagnosis not present

## 2014-12-22 DIAGNOSIS — E119 Type 2 diabetes mellitus without complications: Secondary | ICD-10-CM | POA: Diagnosis not present

## 2014-12-22 DIAGNOSIS — R262 Difficulty in walking, not elsewhere classified: Secondary | ICD-10-CM | POA: Diagnosis not present

## 2014-12-22 DIAGNOSIS — Z7901 Long term (current) use of anticoagulants: Secondary | ICD-10-CM | POA: Diagnosis not present

## 2014-12-22 DIAGNOSIS — I4891 Unspecified atrial fibrillation: Secondary | ICD-10-CM | POA: Diagnosis not present

## 2014-12-23 DIAGNOSIS — R262 Difficulty in walking, not elsewhere classified: Secondary | ICD-10-CM | POA: Diagnosis not present

## 2014-12-23 DIAGNOSIS — M6281 Muscle weakness (generalized): Secondary | ICD-10-CM | POA: Diagnosis not present

## 2014-12-23 DIAGNOSIS — E119 Type 2 diabetes mellitus without complications: Secondary | ICD-10-CM | POA: Diagnosis not present

## 2014-12-23 DIAGNOSIS — R41841 Cognitive communication deficit: Secondary | ICD-10-CM | POA: Diagnosis not present

## 2014-12-23 DIAGNOSIS — R2681 Unsteadiness on feet: Secondary | ICD-10-CM | POA: Diagnosis not present

## 2014-12-25 DIAGNOSIS — R262 Difficulty in walking, not elsewhere classified: Secondary | ICD-10-CM | POA: Diagnosis not present

## 2014-12-25 DIAGNOSIS — E119 Type 2 diabetes mellitus without complications: Secondary | ICD-10-CM | POA: Diagnosis not present

## 2014-12-25 DIAGNOSIS — R2681 Unsteadiness on feet: Secondary | ICD-10-CM | POA: Diagnosis not present

## 2014-12-25 DIAGNOSIS — R41841 Cognitive communication deficit: Secondary | ICD-10-CM | POA: Diagnosis not present

## 2014-12-25 DIAGNOSIS — M6281 Muscle weakness (generalized): Secondary | ICD-10-CM | POA: Diagnosis not present

## 2014-12-27 DIAGNOSIS — R41841 Cognitive communication deficit: Secondary | ICD-10-CM | POA: Diagnosis not present

## 2014-12-27 DIAGNOSIS — R2681 Unsteadiness on feet: Secondary | ICD-10-CM | POA: Diagnosis not present

## 2014-12-27 DIAGNOSIS — R262 Difficulty in walking, not elsewhere classified: Secondary | ICD-10-CM | POA: Diagnosis not present

## 2014-12-27 DIAGNOSIS — M6281 Muscle weakness (generalized): Secondary | ICD-10-CM | POA: Diagnosis not present

## 2014-12-27 DIAGNOSIS — E119 Type 2 diabetes mellitus without complications: Secondary | ICD-10-CM | POA: Diagnosis not present

## 2014-12-28 DIAGNOSIS — M6281 Muscle weakness (generalized): Secondary | ICD-10-CM | POA: Diagnosis not present

## 2014-12-28 DIAGNOSIS — R2681 Unsteadiness on feet: Secondary | ICD-10-CM | POA: Diagnosis not present

## 2014-12-28 DIAGNOSIS — R262 Difficulty in walking, not elsewhere classified: Secondary | ICD-10-CM | POA: Diagnosis not present

## 2014-12-28 DIAGNOSIS — R41841 Cognitive communication deficit: Secondary | ICD-10-CM | POA: Diagnosis not present

## 2014-12-28 DIAGNOSIS — E119 Type 2 diabetes mellitus without complications: Secondary | ICD-10-CM | POA: Diagnosis not present

## 2014-12-29 DIAGNOSIS — R2681 Unsteadiness on feet: Secondary | ICD-10-CM | POA: Diagnosis not present

## 2014-12-29 DIAGNOSIS — R41841 Cognitive communication deficit: Secondary | ICD-10-CM | POA: Diagnosis not present

## 2014-12-29 DIAGNOSIS — R262 Difficulty in walking, not elsewhere classified: Secondary | ICD-10-CM | POA: Diagnosis not present

## 2014-12-29 DIAGNOSIS — M6281 Muscle weakness (generalized): Secondary | ICD-10-CM | POA: Diagnosis not present

## 2014-12-29 DIAGNOSIS — Z7901 Long term (current) use of anticoagulants: Secondary | ICD-10-CM | POA: Diagnosis not present

## 2014-12-29 DIAGNOSIS — E119 Type 2 diabetes mellitus without complications: Secondary | ICD-10-CM | POA: Diagnosis not present

## 2014-12-29 DIAGNOSIS — I4891 Unspecified atrial fibrillation: Secondary | ICD-10-CM | POA: Diagnosis not present

## 2014-12-31 DIAGNOSIS — R41841 Cognitive communication deficit: Secondary | ICD-10-CM | POA: Diagnosis not present

## 2014-12-31 DIAGNOSIS — R262 Difficulty in walking, not elsewhere classified: Secondary | ICD-10-CM | POA: Diagnosis not present

## 2014-12-31 DIAGNOSIS — R2681 Unsteadiness on feet: Secondary | ICD-10-CM | POA: Diagnosis not present

## 2014-12-31 DIAGNOSIS — E119 Type 2 diabetes mellitus without complications: Secondary | ICD-10-CM | POA: Diagnosis not present

## 2014-12-31 DIAGNOSIS — M6281 Muscle weakness (generalized): Secondary | ICD-10-CM | POA: Diagnosis not present

## 2015-01-02 DIAGNOSIS — R262 Difficulty in walking, not elsewhere classified: Secondary | ICD-10-CM | POA: Diagnosis not present

## 2015-01-02 DIAGNOSIS — R2681 Unsteadiness on feet: Secondary | ICD-10-CM | POA: Diagnosis not present

## 2015-01-02 DIAGNOSIS — M6281 Muscle weakness (generalized): Secondary | ICD-10-CM | POA: Diagnosis not present

## 2015-01-02 DIAGNOSIS — R41841 Cognitive communication deficit: Secondary | ICD-10-CM | POA: Diagnosis not present

## 2015-01-02 DIAGNOSIS — E119 Type 2 diabetes mellitus without complications: Secondary | ICD-10-CM | POA: Diagnosis not present

## 2015-01-03 DIAGNOSIS — R2681 Unsteadiness on feet: Secondary | ICD-10-CM | POA: Diagnosis not present

## 2015-01-03 DIAGNOSIS — E119 Type 2 diabetes mellitus without complications: Secondary | ICD-10-CM | POA: Diagnosis not present

## 2015-01-03 DIAGNOSIS — R262 Difficulty in walking, not elsewhere classified: Secondary | ICD-10-CM | POA: Diagnosis not present

## 2015-01-03 DIAGNOSIS — M6281 Muscle weakness (generalized): Secondary | ICD-10-CM | POA: Diagnosis not present

## 2015-01-03 DIAGNOSIS — R41841 Cognitive communication deficit: Secondary | ICD-10-CM | POA: Diagnosis not present

## 2015-01-04 DIAGNOSIS — R41841 Cognitive communication deficit: Secondary | ICD-10-CM | POA: Diagnosis not present

## 2015-01-04 DIAGNOSIS — E119 Type 2 diabetes mellitus without complications: Secondary | ICD-10-CM | POA: Diagnosis not present

## 2015-01-04 DIAGNOSIS — E1151 Type 2 diabetes mellitus with diabetic peripheral angiopathy without gangrene: Secondary | ICD-10-CM | POA: Diagnosis not present

## 2015-01-04 DIAGNOSIS — L84 Corns and callosities: Secondary | ICD-10-CM | POA: Diagnosis not present

## 2015-01-04 DIAGNOSIS — R262 Difficulty in walking, not elsewhere classified: Secondary | ICD-10-CM | POA: Diagnosis not present

## 2015-01-04 DIAGNOSIS — M6281 Muscle weakness (generalized): Secondary | ICD-10-CM | POA: Diagnosis not present

## 2015-01-04 DIAGNOSIS — R2681 Unsteadiness on feet: Secondary | ICD-10-CM | POA: Diagnosis not present

## 2015-01-05 DIAGNOSIS — E119 Type 2 diabetes mellitus without complications: Secondary | ICD-10-CM | POA: Diagnosis not present

## 2015-01-05 DIAGNOSIS — R262 Difficulty in walking, not elsewhere classified: Secondary | ICD-10-CM | POA: Diagnosis not present

## 2015-01-05 DIAGNOSIS — I4891 Unspecified atrial fibrillation: Secondary | ICD-10-CM | POA: Diagnosis not present

## 2015-01-05 DIAGNOSIS — Z7901 Long term (current) use of anticoagulants: Secondary | ICD-10-CM | POA: Diagnosis not present

## 2015-01-05 DIAGNOSIS — R41841 Cognitive communication deficit: Secondary | ICD-10-CM | POA: Diagnosis not present

## 2015-01-05 DIAGNOSIS — M6281 Muscle weakness (generalized): Secondary | ICD-10-CM | POA: Diagnosis not present

## 2015-01-05 DIAGNOSIS — R2681 Unsteadiness on feet: Secondary | ICD-10-CM | POA: Diagnosis not present

## 2015-01-06 DIAGNOSIS — R41841 Cognitive communication deficit: Secondary | ICD-10-CM | POA: Diagnosis not present

## 2015-01-06 DIAGNOSIS — E119 Type 2 diabetes mellitus without complications: Secondary | ICD-10-CM | POA: Diagnosis not present

## 2015-01-06 DIAGNOSIS — R2681 Unsteadiness on feet: Secondary | ICD-10-CM | POA: Diagnosis not present

## 2015-01-06 DIAGNOSIS — M6281 Muscle weakness (generalized): Secondary | ICD-10-CM | POA: Diagnosis not present

## 2015-01-06 DIAGNOSIS — R262 Difficulty in walking, not elsewhere classified: Secondary | ICD-10-CM | POA: Diagnosis not present

## 2015-01-09 DIAGNOSIS — R262 Difficulty in walking, not elsewhere classified: Secondary | ICD-10-CM | POA: Diagnosis not present

## 2015-01-09 DIAGNOSIS — R41841 Cognitive communication deficit: Secondary | ICD-10-CM | POA: Diagnosis not present

## 2015-01-09 DIAGNOSIS — R2681 Unsteadiness on feet: Secondary | ICD-10-CM | POA: Diagnosis not present

## 2015-01-09 DIAGNOSIS — M6281 Muscle weakness (generalized): Secondary | ICD-10-CM | POA: Diagnosis not present

## 2015-01-09 DIAGNOSIS — E119 Type 2 diabetes mellitus without complications: Secondary | ICD-10-CM | POA: Diagnosis not present

## 2015-01-10 DIAGNOSIS — M159 Polyosteoarthritis, unspecified: Secondary | ICD-10-CM | POA: Diagnosis not present

## 2015-01-10 DIAGNOSIS — E119 Type 2 diabetes mellitus without complications: Secondary | ICD-10-CM | POA: Diagnosis not present

## 2015-01-10 DIAGNOSIS — Z789 Other specified health status: Secondary | ICD-10-CM | POA: Diagnosis not present

## 2015-01-10 DIAGNOSIS — Z79899 Other long term (current) drug therapy: Secondary | ICD-10-CM | POA: Diagnosis not present

## 2015-01-10 DIAGNOSIS — I4891 Unspecified atrial fibrillation: Secondary | ICD-10-CM | POA: Diagnosis not present

## 2015-01-10 DIAGNOSIS — D649 Anemia, unspecified: Secondary | ICD-10-CM | POA: Diagnosis not present

## 2015-01-10 DIAGNOSIS — I10 Essential (primary) hypertension: Secondary | ICD-10-CM | POA: Diagnosis not present

## 2015-01-10 DIAGNOSIS — I739 Peripheral vascular disease, unspecified: Secondary | ICD-10-CM | POA: Diagnosis not present

## 2015-01-10 DIAGNOSIS — N289 Disorder of kidney and ureter, unspecified: Secondary | ICD-10-CM | POA: Diagnosis not present

## 2015-01-11 DIAGNOSIS — Z789 Other specified health status: Secondary | ICD-10-CM | POA: Diagnosis not present

## 2015-01-11 DIAGNOSIS — E039 Hypothyroidism, unspecified: Secondary | ICD-10-CM | POA: Diagnosis not present

## 2015-01-11 DIAGNOSIS — I10 Essential (primary) hypertension: Secondary | ICD-10-CM | POA: Diagnosis not present

## 2015-01-11 DIAGNOSIS — M25569 Pain in unspecified knee: Secondary | ICD-10-CM | POA: Diagnosis not present

## 2015-01-11 DIAGNOSIS — R41841 Cognitive communication deficit: Secondary | ICD-10-CM | POA: Diagnosis not present

## 2015-01-11 DIAGNOSIS — R2681 Unsteadiness on feet: Secondary | ICD-10-CM | POA: Diagnosis not present

## 2015-01-11 DIAGNOSIS — I739 Peripheral vascular disease, unspecified: Secondary | ICD-10-CM | POA: Diagnosis not present

## 2015-01-11 DIAGNOSIS — R262 Difficulty in walking, not elsewhere classified: Secondary | ICD-10-CM | POA: Diagnosis not present

## 2015-01-11 DIAGNOSIS — M6281 Muscle weakness (generalized): Secondary | ICD-10-CM | POA: Diagnosis not present

## 2015-01-11 DIAGNOSIS — E119 Type 2 diabetes mellitus without complications: Secondary | ICD-10-CM | POA: Diagnosis not present

## 2015-01-11 DIAGNOSIS — I4891 Unspecified atrial fibrillation: Secondary | ICD-10-CM | POA: Diagnosis not present

## 2015-01-11 DIAGNOSIS — E785 Hyperlipidemia, unspecified: Secondary | ICD-10-CM | POA: Diagnosis not present

## 2015-01-12 DIAGNOSIS — R2681 Unsteadiness on feet: Secondary | ICD-10-CM | POA: Diagnosis not present

## 2015-01-12 DIAGNOSIS — Z7901 Long term (current) use of anticoagulants: Secondary | ICD-10-CM | POA: Diagnosis not present

## 2015-01-12 DIAGNOSIS — R262 Difficulty in walking, not elsewhere classified: Secondary | ICD-10-CM | POA: Diagnosis not present

## 2015-01-12 DIAGNOSIS — M6281 Muscle weakness (generalized): Secondary | ICD-10-CM | POA: Diagnosis not present

## 2015-01-12 DIAGNOSIS — R41841 Cognitive communication deficit: Secondary | ICD-10-CM | POA: Diagnosis not present

## 2015-01-12 DIAGNOSIS — E119 Type 2 diabetes mellitus without complications: Secondary | ICD-10-CM | POA: Diagnosis not present

## 2015-01-12 DIAGNOSIS — I4891 Unspecified atrial fibrillation: Secondary | ICD-10-CM | POA: Diagnosis not present

## 2015-01-13 DIAGNOSIS — M6281 Muscle weakness (generalized): Secondary | ICD-10-CM | POA: Diagnosis not present

## 2015-01-13 DIAGNOSIS — E119 Type 2 diabetes mellitus without complications: Secondary | ICD-10-CM | POA: Diagnosis not present

## 2015-01-13 DIAGNOSIS — R41841 Cognitive communication deficit: Secondary | ICD-10-CM | POA: Diagnosis not present

## 2015-01-13 DIAGNOSIS — R2681 Unsteadiness on feet: Secondary | ICD-10-CM | POA: Diagnosis not present

## 2015-01-13 DIAGNOSIS — R262 Difficulty in walking, not elsewhere classified: Secondary | ICD-10-CM | POA: Diagnosis not present

## 2015-01-14 DIAGNOSIS — E119 Type 2 diabetes mellitus without complications: Secondary | ICD-10-CM | POA: Diagnosis not present

## 2015-01-14 DIAGNOSIS — R41841 Cognitive communication deficit: Secondary | ICD-10-CM | POA: Diagnosis not present

## 2015-01-14 DIAGNOSIS — R262 Difficulty in walking, not elsewhere classified: Secondary | ICD-10-CM | POA: Diagnosis not present

## 2015-01-14 DIAGNOSIS — M6281 Muscle weakness (generalized): Secondary | ICD-10-CM | POA: Diagnosis not present

## 2015-01-14 DIAGNOSIS — R2681 Unsteadiness on feet: Secondary | ICD-10-CM | POA: Diagnosis not present

## 2015-01-15 DIAGNOSIS — M6281 Muscle weakness (generalized): Secondary | ICD-10-CM | POA: Diagnosis not present

## 2015-01-15 DIAGNOSIS — R262 Difficulty in walking, not elsewhere classified: Secondary | ICD-10-CM | POA: Diagnosis not present

## 2015-01-15 DIAGNOSIS — R41841 Cognitive communication deficit: Secondary | ICD-10-CM | POA: Diagnosis not present

## 2015-01-15 DIAGNOSIS — E119 Type 2 diabetes mellitus without complications: Secondary | ICD-10-CM | POA: Diagnosis not present

## 2015-01-15 DIAGNOSIS — R2681 Unsteadiness on feet: Secondary | ICD-10-CM | POA: Diagnosis not present

## 2015-01-17 DIAGNOSIS — I4891 Unspecified atrial fibrillation: Secondary | ICD-10-CM | POA: Diagnosis not present

## 2015-01-17 DIAGNOSIS — E119 Type 2 diabetes mellitus without complications: Secondary | ICD-10-CM | POA: Diagnosis not present

## 2015-01-17 DIAGNOSIS — R262 Difficulty in walking, not elsewhere classified: Secondary | ICD-10-CM | POA: Diagnosis not present

## 2015-01-17 DIAGNOSIS — R41841 Cognitive communication deficit: Secondary | ICD-10-CM | POA: Diagnosis not present

## 2015-01-17 DIAGNOSIS — R2681 Unsteadiness on feet: Secondary | ICD-10-CM | POA: Diagnosis not present

## 2015-01-17 DIAGNOSIS — M25569 Pain in unspecified knee: Secondary | ICD-10-CM | POA: Diagnosis not present

## 2015-01-17 DIAGNOSIS — Z789 Other specified health status: Secondary | ICD-10-CM | POA: Diagnosis not present

## 2015-01-17 DIAGNOSIS — M6281 Muscle weakness (generalized): Secondary | ICD-10-CM | POA: Diagnosis not present

## 2015-01-17 DIAGNOSIS — I10 Essential (primary) hypertension: Secondary | ICD-10-CM | POA: Diagnosis not present

## 2015-01-17 DIAGNOSIS — Z7901 Long term (current) use of anticoagulants: Secondary | ICD-10-CM | POA: Diagnosis not present

## 2015-01-18 ENCOUNTER — Ambulatory Visit: Payer: Self-pay | Admitting: *Deleted

## 2015-01-18 DIAGNOSIS — Z5181 Encounter for therapeutic drug level monitoring: Secondary | ICD-10-CM

## 2015-01-18 DIAGNOSIS — I4891 Unspecified atrial fibrillation: Secondary | ICD-10-CM

## 2015-01-24 DIAGNOSIS — Z789 Other specified health status: Secondary | ICD-10-CM | POA: Diagnosis not present

## 2015-01-24 DIAGNOSIS — I4891 Unspecified atrial fibrillation: Secondary | ICD-10-CM | POA: Diagnosis not present

## 2015-01-24 DIAGNOSIS — M25569 Pain in unspecified knee: Secondary | ICD-10-CM | POA: Diagnosis not present

## 2015-01-24 DIAGNOSIS — E119 Type 2 diabetes mellitus without complications: Secondary | ICD-10-CM | POA: Diagnosis not present

## 2015-01-24 DIAGNOSIS — Z7901 Long term (current) use of anticoagulants: Secondary | ICD-10-CM | POA: Diagnosis not present

## 2015-01-24 DIAGNOSIS — I10 Essential (primary) hypertension: Secondary | ICD-10-CM | POA: Diagnosis not present

## 2015-01-26 ENCOUNTER — Encounter: Payer: Self-pay | Admitting: Internal Medicine

## 2015-01-26 ENCOUNTER — Ambulatory Visit (INDEPENDENT_AMBULATORY_CARE_PROVIDER_SITE_OTHER): Payer: Medicare Other | Admitting: Internal Medicine

## 2015-01-26 VITALS — BP 136/68 | HR 74 | Ht 60.0 in | Wt 124.0 lb

## 2015-01-26 DIAGNOSIS — E785 Hyperlipidemia, unspecified: Secondary | ICD-10-CM

## 2015-01-26 DIAGNOSIS — I10 Essential (primary) hypertension: Secondary | ICD-10-CM

## 2015-01-26 NOTE — Progress Notes (Addendum)
Cardiology Office Note   Date:  01/26/2015   ID:  [name], DOB May 22, 1921, MRN [id number]  PCP:  Delphina Cahill, MD  Cardiologist:   Dorris Carnes, MD   No chief complaint on file.     History of Present Illness: [name] is a 81 y.o. female with a history of atrial fibrillation.  I saw her in June 2015  Also a history of intermittent weak spells (not definitely cardiac in origin)  Patient has been rate controlled and on coumadin.     She is currently living at [facility]. She denies CP  Breathing is good  Denies palpitations Still weak at times  No falls  Doesn't walk Appetite OK  Sleeping OK     Current Outpatient Prescriptions  Medication Sig Dispense Refill  . Acetaminophen 500 MG coapsule Take 250 mg by mouth daily as needed for fever or pain.    . beta carotene w/minerals (OCUVITE) tablet Take 1 tablet by mouth daily.      . Calcium Carbonate-Vitamin D (CALCARB 600/D PO) Take 1 tablet by mouth at bedtime.    . digoxin (LANOXIN) 0.125 MG tablet Take 125 mcg by mouth daily. Takes after lunch    . diltiazem (DILACOR XR) 180 MG 24 hr capsule Take 1 capsule (180 mg total) by mouth at bedtime. 90 capsule 4  . furosemide (LASIX) 20 MG tablet Take 20 mg by mouth every morning.     Marland Kitchen glipiZIDE (GLUCOTROL) 5 MG tablet Take 5 mg by mouth every morning.     Marland Kitchen levothyroxine (SYNTHROID, LEVOTHROID) 25 MCG tablet Take 25 mcg by mouth every morning.     Marland Kitchen losartan (COZAAR) 50 MG tablet Take 50 mg by mouth daily. Daily after lunch    . metFORMIN (GLUCOPHAGE) 500 MG tablet Take 500 mg by mouth 2 (two) times daily with a meal.      . Multiple Vitamins-Minerals (CENTRUM) tablet Take 1 tablet by mouth daily.    . nebivolol (BYSTOLIC) 10 MG tablet Take 10 mg by mouth every morning.     Marland Kitchen omeprazole (PRILOSEC) 20 MG capsule Take 20 mg by mouth 2 (two) times daily.      . potassium chloride SA (K-DUR,KLOR-CON) 20 MEQ tablet Take 20 mEq by mouth every morning.     . psyllium (HYDROCIL/METAMUCIL)  95 % PACK Take 1 packet by mouth daily. 56 each 0  . raloxifene (EVISTA) 60 MG tablet Take 60 mg by mouth every morning.    . rosuvastatin (CRESTOR) 10 MG tablet Take 10 mg by mouth daily.    Marland Kitchen terazosin (HYTRIN) 5 MG capsule Take 1 capsule (5 mg total) by mouth at bedtime. 30 capsule 6  . traMADol-acetaminophen (ULTRACET) 37.5-325 MG per tablet Take 1 tablet by mouth every 4 (four) hours as needed for moderate pain. 30 tablet 0  . warfarin (COUMADIN) 2 MG tablet Take 2-3 mg by mouth at bedtime. Takes one tablet daily on Sun, Tues, Wed, Fri and Sat.  Takes one and one-half tablet on Mon and Thurs.     No current facility-administered medications for this visit.    Allergies:   Review of patient's allergies indicates no known allergies.   Past Medical History  Diagnosis Date  . Hypertension   . Carcinoma of breast     left masectomy in 1995  . Atrial fibrillation 2007    CHF with preserved LV systolic function - 6010; moderate LVH  . Arteriosclerotic cardiovascular disease (ASCVD)  cath in 2001- 40% LAD and 50% RCA; stent for 80% LAD in 5/04; residual 50% LAD, 80% small D1, 70% small OM1 50% ostial RCA, nl EF   . MVP (mitral valve prolapse)     moderate; with moderate MR  . PVD (peripheral vascular disease)     ABIs of 0.64 and 0.59, right and left leg in 2009  . CVD (cerebrovascular disease)     plaque w/o focal disease in 2006; h/o CVA  . Diabetes mellitus type II     no insulin   . Hypothyroid   . Osteoporosis   . Edema   . Herpes zoster   . Chronic hoarseness   . Bronchitis     history  . Varicose veins of legs 08/20/2011  . Lower extremity edema 08/20/2011  . Chronic diastolic heart failure 8/65/7846  . 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 Shingles   . Ulcer     left lower leg  . Peripheral neuropathy     Past Surgical History  Procedure Laterality Date  . Orif of left hip  05/22/06    [name]  . Left masectomy  1995  . Cholecystectomy  2006  . Abdominal hysterectomy        abd?  . Cataract extraction, bilateral    . Colonoscopy  Date unknown  . Coronary angioplasty with stent placement       Social History:  The patient  reports that she has never smoked. She has never used smokeless tobacco. She reports that she does not drink alcohol or use illicit drugs.   Family History:  The patient's family history includes Diabetes in her mother.    ROS:  Please see the history of present illness. All other systems are reviewed and  Negative to the above problem except as noted.    PHYSICAL EXAM: VS:  BP 136/68 mmHg  Pulse 74  Ht 5' (1.524 m)  Wt 124 lb (56.246 kg)  BMI 24.22 kg/m2  SpO2 98%  GEN: Well nourished, well developed, in no acute distress HEENT: normal Neck: no JVD, carotid bruits, or masses Cardiac: Irreg; no murmurs, rubs, or gallops,1+ edema  Respiratory:  clear to auscultation bilaterally, normal work of breathing GI: soft, nontender, nondistended, + BS  No hepatomegaly  MS: no deformity Moving all extremities   Skin: warm and dry, no rash Neuro:  Strength and sensation are intact Psych: euthymic mood, full affect   EKG:  EKG is ordered today.  Atrial fib  68 bpm  LVH with repol abnormalitiy.     Lipid Panel    Component Value Date/Time   CHOL  10/19/2010 0615    120        ATP III CLASSIFICATION:  <200     mg/dL   Desirable  200-239  mg/dL   Borderline High  >=240    mg/dL   High          TRIG 74 10/19/2010 0615   HDL 39* 10/19/2010 0615   CHOLHDL 3.1 10/19/2010 0615   VLDL 15 10/19/2010 0615   LDLCALC  10/19/2010 0615    66        Total Cholesterol/HDL:CHD Risk Coronary Heart Disease Risk Table                     Men   Women  1/2 Average Risk   3.4   3.3  Average Risk       5.0   4.4  2 X  Average Risk   9.6   7.1  3 X Average Risk  23.4   11.0        Use the calculated Patient Ratio above and the CHD Risk Table to determine the patient's CHD Risk.        ATP III CLASSIFICATION (LDL):  <100     mg/dL    Optimal  100-129  mg/dL   Near or Above                    Optimal  130-159  mg/dL   Borderline  160-189  mg/dL   High  >190     mg/dL   Very High      Wt Readings from Last 3 Encounters:  01/26/15 124 lb (56.246 kg)  04/13/14 134 lb (60.782 kg)  04/04/14 132 lb (59.875 kg)      ASSESSMENT AND PLAN:   1.  Atrial fib.  Continue rate control and coumadin  WIll get labs today  2.  HTN  Good control  Some perip edema but otherwise volume status is OK    3.  Hypothyroid   Check TSH  4.  HL  WIll get lipids and AST today.  F?U in 1 year.  Updated pts med list   Signed, Dorris Carnes, MD  01/26/2015 1:39 PM    [facility] [place], [place], [place]  [address] Phone: [phone]; Fax: [phone]

## 2015-01-26 NOTE — Patient Instructions (Signed)
Your physician wants you to follow-up in: 1 year with Dr Harrington Challenger.  You will receive a reminder letter in the mail two months in advance. If you don't receive a letter, please call our office to schedule the follow-up appointment.  Your physician recommends that you return for lab work in: (BMET, CBC, Lipid, AST, TSH, INR)  Your physician recommends that you continue on your current medications as directed. Please refer to the Current Medication list given to you today.  Thank you for choosing [facility]!

## 2015-01-31 DIAGNOSIS — I4891 Unspecified atrial fibrillation: Secondary | ICD-10-CM | POA: Diagnosis not present

## 2015-01-31 DIAGNOSIS — Z7901 Long term (current) use of anticoagulants: Secondary | ICD-10-CM | POA: Diagnosis not present

## 2015-02-07 DIAGNOSIS — Z7901 Long term (current) use of anticoagulants: Secondary | ICD-10-CM | POA: Diagnosis not present

## 2015-02-07 DIAGNOSIS — I4891 Unspecified atrial fibrillation: Secondary | ICD-10-CM | POA: Diagnosis not present

## 2015-02-09 DIAGNOSIS — I10 Essential (primary) hypertension: Secondary | ICD-10-CM | POA: Diagnosis not present

## 2015-02-09 DIAGNOSIS — Z79899 Other long term (current) drug therapy: Secondary | ICD-10-CM | POA: Diagnosis not present

## 2015-02-09 DIAGNOSIS — D649 Anemia, unspecified: Secondary | ICD-10-CM | POA: Diagnosis not present

## 2015-02-09 DIAGNOSIS — I4891 Unspecified atrial fibrillation: Secondary | ICD-10-CM | POA: Diagnosis not present

## 2015-02-09 DIAGNOSIS — I739 Peripheral vascular disease, unspecified: Secondary | ICD-10-CM | POA: Diagnosis not present

## 2015-02-10 DIAGNOSIS — Z7901 Long term (current) use of anticoagulants: Secondary | ICD-10-CM | POA: Diagnosis not present

## 2015-02-10 DIAGNOSIS — I4891 Unspecified atrial fibrillation: Secondary | ICD-10-CM | POA: Diagnosis not present

## 2015-02-12 DIAGNOSIS — R070 Pain in throat: Secondary | ICD-10-CM | POA: Diagnosis not present

## 2015-02-13 DIAGNOSIS — Z7901 Long term (current) use of anticoagulants: Secondary | ICD-10-CM | POA: Diagnosis not present

## 2015-02-13 DIAGNOSIS — I4891 Unspecified atrial fibrillation: Secondary | ICD-10-CM | POA: Diagnosis not present

## 2015-02-15 DIAGNOSIS — R070 Pain in throat: Secondary | ICD-10-CM | POA: Diagnosis not present

## 2015-02-15 DIAGNOSIS — E162 Hypoglycemia, unspecified: Secondary | ICD-10-CM | POA: Diagnosis not present

## 2015-02-17 DIAGNOSIS — R070 Pain in throat: Secondary | ICD-10-CM | POA: Diagnosis not present

## 2015-02-17 DIAGNOSIS — Z7901 Long term (current) use of anticoagulants: Secondary | ICD-10-CM | POA: Diagnosis not present

## 2015-02-17 DIAGNOSIS — I4891 Unspecified atrial fibrillation: Secondary | ICD-10-CM | POA: Diagnosis not present

## 2015-02-17 DIAGNOSIS — E162 Hypoglycemia, unspecified: Secondary | ICD-10-CM | POA: Diagnosis not present

## 2015-02-20 DIAGNOSIS — H35372 Puckering of macula, left eye: Secondary | ICD-10-CM | POA: Diagnosis not present

## 2015-02-20 DIAGNOSIS — H3532 Exudative age-related macular degeneration: Secondary | ICD-10-CM | POA: Diagnosis not present

## 2015-02-20 DIAGNOSIS — E119 Type 2 diabetes mellitus without complications: Secondary | ICD-10-CM | POA: Diagnosis not present

## 2015-02-20 DIAGNOSIS — E1139 Type 2 diabetes mellitus with other diabetic ophthalmic complication: Secondary | ICD-10-CM | POA: Diagnosis not present

## 2015-02-20 DIAGNOSIS — R739 Hyperglycemia, unspecified: Secondary | ICD-10-CM | POA: Diagnosis not present

## 2015-02-21 DIAGNOSIS — I10 Essential (primary) hypertension: Secondary | ICD-10-CM | POA: Diagnosis not present

## 2015-02-21 DIAGNOSIS — I739 Peripheral vascular disease, unspecified: Secondary | ICD-10-CM | POA: Diagnosis not present

## 2015-02-21 DIAGNOSIS — Z7901 Long term (current) use of anticoagulants: Secondary | ICD-10-CM | POA: Diagnosis not present

## 2015-02-21 DIAGNOSIS — I4891 Unspecified atrial fibrillation: Secondary | ICD-10-CM | POA: Diagnosis not present

## 2015-02-21 DIAGNOSIS — I509 Heart failure, unspecified: Secondary | ICD-10-CM | POA: Diagnosis not present

## 2015-02-28 DIAGNOSIS — I4891 Unspecified atrial fibrillation: Secondary | ICD-10-CM | POA: Diagnosis not present

## 2015-02-28 DIAGNOSIS — Z7901 Long term (current) use of anticoagulants: Secondary | ICD-10-CM | POA: Diagnosis not present

## 2015-03-03 DIAGNOSIS — Z7901 Long term (current) use of anticoagulants: Secondary | ICD-10-CM | POA: Diagnosis not present

## 2015-03-03 DIAGNOSIS — I4891 Unspecified atrial fibrillation: Secondary | ICD-10-CM | POA: Diagnosis not present

## 2015-03-06 DIAGNOSIS — E119 Type 2 diabetes mellitus without complications: Secondary | ICD-10-CM | POA: Diagnosis not present

## 2015-03-06 DIAGNOSIS — I4891 Unspecified atrial fibrillation: Secondary | ICD-10-CM | POA: Diagnosis not present

## 2015-03-10 DIAGNOSIS — Z7901 Long term (current) use of anticoagulants: Secondary | ICD-10-CM | POA: Diagnosis not present

## 2015-03-10 DIAGNOSIS — I4891 Unspecified atrial fibrillation: Secondary | ICD-10-CM | POA: Diagnosis not present

## 2015-03-10 DIAGNOSIS — E119 Type 2 diabetes mellitus without complications: Secondary | ICD-10-CM | POA: Diagnosis not present

## 2015-03-10 DIAGNOSIS — I10 Essential (primary) hypertension: Secondary | ICD-10-CM | POA: Diagnosis not present

## 2015-03-11 DIAGNOSIS — I10 Essential (primary) hypertension: Secondary | ICD-10-CM | POA: Diagnosis not present

## 2015-03-11 DIAGNOSIS — N289 Disorder of kidney and ureter, unspecified: Secondary | ICD-10-CM | POA: Diagnosis not present

## 2015-03-11 DIAGNOSIS — Z79899 Other long term (current) drug therapy: Secondary | ICD-10-CM | POA: Diagnosis not present

## 2015-03-11 DIAGNOSIS — E119 Type 2 diabetes mellitus without complications: Secondary | ICD-10-CM | POA: Diagnosis not present

## 2015-03-11 DIAGNOSIS — I4891 Unspecified atrial fibrillation: Secondary | ICD-10-CM | POA: Diagnosis not present

## 2015-03-11 DIAGNOSIS — D649 Anemia, unspecified: Secondary | ICD-10-CM | POA: Diagnosis not present

## 2015-03-17 DIAGNOSIS — I4891 Unspecified atrial fibrillation: Secondary | ICD-10-CM | POA: Diagnosis not present

## 2015-03-17 DIAGNOSIS — Z7901 Long term (current) use of anticoagulants: Secondary | ICD-10-CM | POA: Diagnosis not present

## 2015-03-17 DIAGNOSIS — I739 Peripheral vascular disease, unspecified: Secondary | ICD-10-CM | POA: Diagnosis not present

## 2015-03-31 DIAGNOSIS — Z7901 Long term (current) use of anticoagulants: Secondary | ICD-10-CM | POA: Diagnosis not present

## 2015-03-31 DIAGNOSIS — I4891 Unspecified atrial fibrillation: Secondary | ICD-10-CM | POA: Diagnosis not present

## 2015-04-03 DIAGNOSIS — Z7901 Long term (current) use of anticoagulants: Secondary | ICD-10-CM | POA: Diagnosis not present

## 2015-04-03 DIAGNOSIS — I4891 Unspecified atrial fibrillation: Secondary | ICD-10-CM | POA: Diagnosis not present

## 2015-04-05 DIAGNOSIS — E1140 Type 2 diabetes mellitus with diabetic neuropathy, unspecified: Secondary | ICD-10-CM | POA: Diagnosis not present

## 2015-04-05 DIAGNOSIS — I739 Peripheral vascular disease, unspecified: Secondary | ICD-10-CM | POA: Diagnosis not present

## 2015-04-05 DIAGNOSIS — M25569 Pain in unspecified knee: Secondary | ICD-10-CM | POA: Diagnosis not present

## 2015-04-05 DIAGNOSIS — M159 Polyosteoarthritis, unspecified: Secondary | ICD-10-CM | POA: Diagnosis not present

## 2015-04-06 DIAGNOSIS — Z7901 Long term (current) use of anticoagulants: Secondary | ICD-10-CM | POA: Diagnosis not present

## 2015-04-06 DIAGNOSIS — I4891 Unspecified atrial fibrillation: Secondary | ICD-10-CM | POA: Diagnosis not present

## 2015-04-10 DIAGNOSIS — E119 Type 2 diabetes mellitus without complications: Secondary | ICD-10-CM | POA: Diagnosis not present

## 2015-04-10 DIAGNOSIS — D649 Anemia, unspecified: Secondary | ICD-10-CM | POA: Diagnosis not present

## 2015-04-10 DIAGNOSIS — Z7901 Long term (current) use of anticoagulants: Secondary | ICD-10-CM | POA: Diagnosis not present

## 2015-04-10 DIAGNOSIS — I4891 Unspecified atrial fibrillation: Secondary | ICD-10-CM | POA: Diagnosis not present

## 2015-04-10 DIAGNOSIS — I739 Peripheral vascular disease, unspecified: Secondary | ICD-10-CM | POA: Diagnosis not present

## 2015-04-10 DIAGNOSIS — I10 Essential (primary) hypertension: Secondary | ICD-10-CM | POA: Diagnosis not present

## 2015-04-10 DIAGNOSIS — Z79899 Other long term (current) drug therapy: Secondary | ICD-10-CM | POA: Diagnosis not present

## 2015-04-11 DIAGNOSIS — I4891 Unspecified atrial fibrillation: Secondary | ICD-10-CM | POA: Diagnosis not present

## 2015-04-11 DIAGNOSIS — Z7901 Long term (current) use of anticoagulants: Secondary | ICD-10-CM | POA: Diagnosis not present

## 2015-04-14 DIAGNOSIS — E785 Hyperlipidemia, unspecified: Secondary | ICD-10-CM | POA: Diagnosis not present

## 2015-04-14 DIAGNOSIS — I739 Peripheral vascular disease, unspecified: Secondary | ICD-10-CM | POA: Diagnosis not present

## 2015-04-14 DIAGNOSIS — E119 Type 2 diabetes mellitus without complications: Secondary | ICD-10-CM | POA: Diagnosis not present

## 2015-04-14 DIAGNOSIS — Z7901 Long term (current) use of anticoagulants: Secondary | ICD-10-CM | POA: Diagnosis not present

## 2015-04-14 DIAGNOSIS — I4891 Unspecified atrial fibrillation: Secondary | ICD-10-CM | POA: Diagnosis not present

## 2015-04-14 DIAGNOSIS — N289 Disorder of kidney and ureter, unspecified: Secondary | ICD-10-CM | POA: Diagnosis not present

## 2015-04-14 DIAGNOSIS — I10 Essential (primary) hypertension: Secondary | ICD-10-CM | POA: Diagnosis not present

## 2015-04-14 DIAGNOSIS — E039 Hypothyroidism, unspecified: Secondary | ICD-10-CM | POA: Diagnosis not present

## 2015-04-21 DIAGNOSIS — Z7901 Long term (current) use of anticoagulants: Secondary | ICD-10-CM | POA: Diagnosis not present

## 2015-04-21 DIAGNOSIS — I4891 Unspecified atrial fibrillation: Secondary | ICD-10-CM | POA: Diagnosis not present

## 2015-04-21 DIAGNOSIS — M159 Polyosteoarthritis, unspecified: Secondary | ICD-10-CM | POA: Diagnosis not present

## 2015-04-28 DIAGNOSIS — I4891 Unspecified atrial fibrillation: Secondary | ICD-10-CM | POA: Diagnosis not present

## 2015-04-28 DIAGNOSIS — Z7901 Long term (current) use of anticoagulants: Secondary | ICD-10-CM | POA: Diagnosis not present

## 2015-05-05 DIAGNOSIS — I4891 Unspecified atrial fibrillation: Secondary | ICD-10-CM | POA: Diagnosis not present

## 2015-05-05 DIAGNOSIS — Z7901 Long term (current) use of anticoagulants: Secondary | ICD-10-CM | POA: Diagnosis not present

## 2015-05-10 DIAGNOSIS — E119 Type 2 diabetes mellitus without complications: Secondary | ICD-10-CM | POA: Diagnosis not present

## 2015-05-10 DIAGNOSIS — Z79899 Other long term (current) drug therapy: Secondary | ICD-10-CM | POA: Diagnosis not present

## 2015-05-10 DIAGNOSIS — I739 Peripheral vascular disease, unspecified: Secondary | ICD-10-CM | POA: Diagnosis not present

## 2015-05-10 DIAGNOSIS — D649 Anemia, unspecified: Secondary | ICD-10-CM | POA: Diagnosis not present

## 2015-05-10 DIAGNOSIS — I10 Essential (primary) hypertension: Secondary | ICD-10-CM | POA: Diagnosis not present

## 2015-05-10 DIAGNOSIS — I4891 Unspecified atrial fibrillation: Secondary | ICD-10-CM | POA: Diagnosis not present

## 2015-05-12 DIAGNOSIS — Z7901 Long term (current) use of anticoagulants: Secondary | ICD-10-CM | POA: Diagnosis not present

## 2015-05-12 DIAGNOSIS — I4891 Unspecified atrial fibrillation: Secondary | ICD-10-CM | POA: Diagnosis not present

## 2015-05-15 DIAGNOSIS — Z7901 Long term (current) use of anticoagulants: Secondary | ICD-10-CM | POA: Diagnosis not present

## 2015-05-15 DIAGNOSIS — I4891 Unspecified atrial fibrillation: Secondary | ICD-10-CM | POA: Diagnosis not present

## 2015-05-18 DIAGNOSIS — I4891 Unspecified atrial fibrillation: Secondary | ICD-10-CM | POA: Diagnosis not present

## 2015-05-18 DIAGNOSIS — Z7901 Long term (current) use of anticoagulants: Secondary | ICD-10-CM | POA: Diagnosis not present

## 2015-05-21 DIAGNOSIS — I4891 Unspecified atrial fibrillation: Secondary | ICD-10-CM | POA: Diagnosis not present

## 2015-05-23 DIAGNOSIS — R6889 Other general symptoms and signs: Secondary | ICD-10-CM | POA: Diagnosis not present

## 2015-05-23 DIAGNOSIS — Z7901 Long term (current) use of anticoagulants: Secondary | ICD-10-CM | POA: Diagnosis not present

## 2015-05-23 DIAGNOSIS — I4891 Unspecified atrial fibrillation: Secondary | ICD-10-CM | POA: Diagnosis not present

## 2015-05-26 DIAGNOSIS — I4891 Unspecified atrial fibrillation: Secondary | ICD-10-CM | POA: Diagnosis not present

## 2015-05-26 DIAGNOSIS — Z7901 Long term (current) use of anticoagulants: Secondary | ICD-10-CM | POA: Diagnosis not present

## 2015-05-26 DIAGNOSIS — R6889 Other general symptoms and signs: Secondary | ICD-10-CM | POA: Diagnosis not present

## 2015-05-31 DIAGNOSIS — I4891 Unspecified atrial fibrillation: Secondary | ICD-10-CM | POA: Diagnosis not present

## 2015-05-31 DIAGNOSIS — Z7901 Long term (current) use of anticoagulants: Secondary | ICD-10-CM | POA: Diagnosis not present

## 2015-06-05 DIAGNOSIS — Z7901 Long term (current) use of anticoagulants: Secondary | ICD-10-CM | POA: Diagnosis not present

## 2015-06-05 DIAGNOSIS — I4891 Unspecified atrial fibrillation: Secondary | ICD-10-CM | POA: Diagnosis not present

## 2015-06-09 DIAGNOSIS — I10 Essential (primary) hypertension: Secondary | ICD-10-CM | POA: Diagnosis not present

## 2015-06-09 DIAGNOSIS — I4891 Unspecified atrial fibrillation: Secondary | ICD-10-CM | POA: Diagnosis not present

## 2015-06-12 DIAGNOSIS — I4891 Unspecified atrial fibrillation: Secondary | ICD-10-CM | POA: Diagnosis not present

## 2015-06-14 DIAGNOSIS — E1151 Type 2 diabetes mellitus with diabetic peripheral angiopathy without gangrene: Secondary | ICD-10-CM | POA: Diagnosis not present

## 2015-06-16 DIAGNOSIS — I4891 Unspecified atrial fibrillation: Secondary | ICD-10-CM | POA: Diagnosis not present

## 2015-06-16 DIAGNOSIS — I10 Essential (primary) hypertension: Secondary | ICD-10-CM | POA: Diagnosis not present

## 2015-06-30 DIAGNOSIS — I4891 Unspecified atrial fibrillation: Secondary | ICD-10-CM | POA: Diagnosis not present

## 2015-07-14 DIAGNOSIS — I4891 Unspecified atrial fibrillation: Secondary | ICD-10-CM | POA: Diagnosis not present

## 2015-07-17 DIAGNOSIS — I4891 Unspecified atrial fibrillation: Secondary | ICD-10-CM | POA: Diagnosis not present

## 2015-07-25 DIAGNOSIS — R6889 Other general symptoms and signs: Secondary | ICD-10-CM | POA: Diagnosis not present

## 2015-09-02 ENCOUNTER — Emergency Department (HOSPITAL_COMMUNITY)
Admission: EM | Admit: 2015-09-02 | Discharge: 2015-09-02 | Disposition: A | Discharge status: Home / Self Care | Payer: Medicare Other | Attending: Emergency Medicine | Admitting: Emergency Medicine

## 2015-09-02 ENCOUNTER — Emergency Department (HOSPITAL_COMMUNITY): Payer: Medicare Other

## 2015-09-02 ENCOUNTER — Encounter (HOSPITAL_COMMUNITY): Payer: Self-pay | Admitting: *Deleted

## 2015-09-02 DIAGNOSIS — M25521 Pain in right elbow: Secondary | ICD-10-CM | POA: Insufficient documentation

## 2015-09-02 DIAGNOSIS — I10 Essential (primary) hypertension: Secondary | ICD-10-CM | POA: Insufficient documentation

## 2015-09-02 DIAGNOSIS — I5032 Chronic diastolic (congestive) heart failure: Secondary | ICD-10-CM | POA: Insufficient documentation

## 2015-09-02 DIAGNOSIS — Z7984 Long term (current) use of oral hypoglycemic drugs: Secondary | ICD-10-CM | POA: Diagnosis not present

## 2015-09-02 DIAGNOSIS — R600 Localized edema: Secondary | ICD-10-CM | POA: Diagnosis not present

## 2015-09-02 DIAGNOSIS — M810 Age-related osteoporosis without current pathological fracture: Secondary | ICD-10-CM | POA: Diagnosis not present

## 2015-09-02 DIAGNOSIS — E039 Hypothyroidism, unspecified: Secondary | ICD-10-CM | POA: Diagnosis not present

## 2015-09-02 DIAGNOSIS — G629 Polyneuropathy, unspecified: Secondary | ICD-10-CM | POA: Diagnosis not present

## 2015-09-02 DIAGNOSIS — Z8619 Personal history of other infectious and parasitic diseases: Secondary | ICD-10-CM | POA: Diagnosis not present

## 2015-09-02 DIAGNOSIS — E109 Type 1 diabetes mellitus without complications: Secondary | ICD-10-CM | POA: Diagnosis not present

## 2015-09-02 DIAGNOSIS — Z79899 Other long term (current) drug therapy: Secondary | ICD-10-CM | POA: Diagnosis not present

## 2015-09-02 DIAGNOSIS — Z853 Personal history of malignant neoplasm of breast: Secondary | ICD-10-CM | POA: Insufficient documentation

## 2015-09-02 DIAGNOSIS — Z8709 Personal history of other diseases of the respiratory system: Secondary | ICD-10-CM | POA: Diagnosis not present

## 2015-09-02 DIAGNOSIS — I2510 Atherosclerotic heart disease of native coronary artery without angina pectoris: Secondary | ICD-10-CM | POA: Insufficient documentation

## 2015-09-02 DIAGNOSIS — Z7901 Long term (current) use of anticoagulants: Secondary | ICD-10-CM | POA: Diagnosis not present

## 2015-09-02 DIAGNOSIS — Z8673 Personal history of transient ischemic attack (TIA), and cerebral infarction without residual deficits: Secondary | ICD-10-CM | POA: Diagnosis not present

## 2015-09-02 DIAGNOSIS — I4891 Unspecified atrial fibrillation: Secondary | ICD-10-CM | POA: Diagnosis not present

## 2015-09-02 NOTE — Discharge Instructions (Signed)
°Emergency Department Resource Guide °1) Find a Doctor and Pay Out of Pocket °Although you won't have to find out who is covered by your insurance plan, it is a good idea to ask around and get recommendations. You will then need to call the office and see if the doctor you have chosen will accept you as a new patient and what types of options they offer for patients who are self-pay. Some doctors offer discounts or will set up payment plans for their patients who do not have insurance, but you will need to ask so you aren't surprised when you get to your appointment. ° °2) Contact Your Local Health Department °Not all health departments have doctors that can see patients for sick visits, but many do, so it is worth a call to see if yours does. If you don't know where your local health department is, you can check in your phone book. The CDC also has a tool to help you locate your state's health department, and many state websites also have listings of all of their local health departments. ° °3) Find a Walk-in Clinic °If your illness is not likely to be very severe or complicated, you may want to try a walk in clinic. These are popping up all over the country in pharmacies, drugstores, and shopping centers. They're usually staffed by nurse practitioners or physician assistants that have been trained to treat common illnesses and complaints. They're usually fairly quick and inexpensive. However, if you have serious medical issues or chronic medical problems, these are probably not your best option. ° °No Primary Care Doctor: °- Call Health Connect at  832-8000 - they can help you locate a primary care doctor that  accepts your insurance, provides certain services, etc. °- Physician Referral Service- 1-800-533-3463 ° °Chronic Pain Problems: °Organization         Address  Phone   Notes  °[facility]  ([phone]) [phone] Patients need to be referred by their primary care doctor.  ° °Medication  Assistance: °Organization         Address  Phone   Notes  °[place] [facility] [address] °[place], [place] [address] (336) 641-8030 --Must be a resident of [place] °-- Must have NO insurance coverage whatsoever (no Medicaid/ Medicare, etc.) °-- The pt. MUST have a primary care doctor that directs their care regularly and follows them in the community °  °[facility]  (866) 331-1348   °[employer]  (888) 892-1162   ° °Agencies that provide inexpensive medical care: °Organization         Address  Phone   Notes  °[facility]  (336) 832-8035   °[facility]    (336) 832-7272   °[facility] [address] °[place], [place] [address] (336) 832-4777   °[facility] [address], °[place] (336) 271-4999   °[facility]    (336) 373-0678   °[facility]    (336) 272-1050   °[facility] ° [address], [place] Phone:  (336) 832-4444, Fax:  (336) 832-4440 Hours of Operation:  9 am - 6 pm, M-F.  Also accepts Medicaid/Medicare and self-pay.  °[facility] ° [address],  Phone: (336) 832-3150, Fax: (336) [phone]. Hours of Operation:  8:30 am - 5:30 pm, M-F.  Also accepts Medicaid and self-pay.  °[facility] [place] [address], [place] Phone: (336) 878-6027   °[facility] [address], [place], [place] (336)723-1848, Ext. 123 Mondays & Thursdays: 7-9 AM.  First 15 patients are seen on a first come, first serve basis. °  ° °Medicaid-accepting [place] Providers: ° °Organization         Address  Phone   Notes  °[facility] [address], [place] (336) 641-2100 Also accepts self-pay patients.  °[facility] [address], [place] ° (336) 856-9996   °[facility] [address], [place]  (336) 288-8857   °[facility] [address] [place] [address], [place] (336) 299-7000   °Ve[facility] [address], [place]  ° (336) 373-1557 Only accepts [place] Access Medicaid patients after they have their name applied to their card.  ° °Self-Pay (no insurance) in [place]: ° °Organization         Address  Phone   Notes  °Sickle Cell Patients, [facility] [address], [place] (336) 832-1970   °[facility] [address], [place] (336) 832-4400   °[facility] ° [address], [place] (336) 992-4800   °[facility]/Dr. Osei-Bonsu ° [address] [place] [address], [place] or [address], [place] (336) [phone] Phone number for both [place] and [place] locations is the same.  °[facility] [address], [place] (336) 299-0000   °[facility] [address] [place] [address], [place] or [address] (336) 852-7530 °(336) 878-2260   °[facility] [address], [place] (336) 350-1642, phone; (336) 294-5005, fax Sees patients 1st and 3rd Saturday of every month.  Must not qualify for public or private insurance (i.e. Medicaid, Medicare, [place] Health Choice, Veterans' Benefits) • Household income should be no more than 200% of the poverty level •The clinic cannot treat you if you are pregnant or think you are pregnant • Sexually transmitted diseases are not treated at the clinic.  ° ° °Dental Care: °Organization         Address  Phone  Notes  °[place] [facility] [address], [place] (336) 641-6152 Accepts children up to age 21 who are enrolled in Medicaid or [place] Health Choice; pregnant women with a Medicaid card; and children who have applied for Medicaid or [place] Health Choice, but were declined, whose parents can pay a reduced fee at time of service.  °[place]  [facility] [place]  [address], [place] (336) 641-7733 Accepts children up to age 21 who are enrolled in Medicaid or [place] Health Choice; pregnant women with a Medicaid card; and children who have applied for Medicaid or  Health Choice, but were declined, whose parents can pay a reduced fee at time of service.  °[facility] ° [address], [place] (336) 641-4533 Patients are seen by appointment only. Walk-ins are not accepted. [facility] will see patients 18 years of age and older. °Monday - Tuesday (8am-5pm) °Most Wednesdays (8:30-5pm) °$30 per visit, cash only  °[facility] ° [address], [place] (336) 641-4533 Patients are seen by appointment only. Walk-ins are not accepted. [facility] will see patients 18 years of age and older. °One   Wednesday Evening (Monthly: Volunteer Based).  $30 per visit, cash only  °[facility]  (919) 537-3737 for adults; Children under age 4, call Graduate Pediatric Dentistry at (919) [phone]. Children aged 4-14, please call (919) [phone] to request a pediatric application. ° Dental services are provided in all areas of dental care including fillings, crowns and bridges, complete and partial dentures, implants, gum treatment, root canals, and extractions. Preventive care is also provided. Treatment is provided to both adults and children. °Patients are selected via a lottery and there is often a waiting list. °  °[facility] [address], °[place] ° (336) 763-8833 [email or web address] °  °[facility] [address], [place], [place] (336)723-1848, Ext. 123 Second and Fourth Thursday of each month, opens at 6:30 AM; Clinic ends at 9 AM.  Patients are seen on a first-come first-served basis, and a limited number are seen during each clinic.  ° °[facility] ° [address], [place], [place] (336) 723-7904    Eligibility Requirements °You must have lived in [place], [place], or [place] counties for at least the last three months. °  You cannot be eligible for state or federal sponsored healthcare insurance, including Veterans Administration, Medicaid, or Medicare. °  You generally cannot be eligible for healthcare insurance through your employer.  °  How to apply: °Eligibility screenings are held every Tuesday and Wednesday afternoon from 1:00 pm until 4:00 pm. You do not need an appointment for the interview!  °[facility] [address], [place], [place] 336-631-2330   °[facility]  336-342-8273   °[place]  336-703-3100   ° [facility]  336-570-6415   ° °Behavioral Health Resources in the Community: °Intensive Outpatient Programs °Organization         Address  Phone  Notes  °[facility] [address], [place], [place] 336-878-6098   °[facility] [address], [place], [place] 336-832-9800   °[facility] [address], [place], [place] ° 336-882-2125   °[facility] [address],  °[place], [place] 1-800-853-5163 or 336-641-4981   °Substance Abuse Resources °Organization         Address  Phone  Notes  °[facility]  336-882-2125   °[facility]  336-784-9470   °[facility]  336-285-9073   °[facility]  336-845-3988   °Residential & Outpatient Substance Abuse Program  1-800-659-3381   °Psychological Services °Organization         Address  Phone  Notes  °[facility]  336- 832-9600   °[facility]  336- 378-7881   °[facility] [address], [place] 1-800-853-5163 or 336-641-4981   ° °Mobile Crisis Teams °Organization         Address  Phone  Notes  °[facility], Mobile Crisis Care Unit  1-877-626-1772   °Assertive °[facility] ° [address]  [place], [place] 336-834-9664   °Sharon DeEsch [address] °[place] 336-554-5454   ° °Self-Help/Support Groups °Organization         Address  Phone             Notes  °[facility] [place] - variety of support groups  336- 373-1402 Call for more information  °Narcotics Anonymous (NA), [facility] [address], °[place]  2 meetings at this location  ° °  Residential Treatment Programs Organization         Address  Phone  Notes  [facility] [address]    [place]  1-6134781971   [facility]  [address], [place] [phone], [place], [place]   [place], [place] [phone] Admissions: 8am-3pm M-F  [facility] [place] [address],    [place], [place] X4321937   [facility] [address] [place], [place], [place]   [facility] [address]  [place], Center   [facility] Intensive Outpatient [address], [place], [place]   [facility] ([place] [address]  [place], [place] 1-586-670-8131 or 416-286-0585   [facility] ([facility] [address] [place], [place] Accepts Medicaid  [facility] [address],  [place] 1-(863)877-7656 Substance Abuse/Addiction Treatment   [facility]         Address  Phone  Notes  [facility]  (425)236-1406   Domenic Schwab, PhD [address] [place], [place]   458-622-2699 or (205)142-1709   [place], [place] [phone]   [facility] [address] [place], [place] 978-708-2080 Insurance/Medicaid/sponsorship through [facility] [address] [place]                                    [place], [place] 213-193-1385 [place] [address], [place] 204-731-9908    Dr. Adele Schilder  410-438-0886   [facility] 1) [address], [place] 2) [place] 3)  [address], [place] ([phone] [phone]  616-306-1922   [place] 202-093-8008 or [phone] (After Hours)      Wear the splint/sling until you are seen in follow up by the Orthopedic doctor.  Call the Orthopedic doctor on Monday to schedule a follow up appointment within the next 3 days.  Return to the Emergency Department immediately sooner if worsening.

## 2015-09-02 NOTE — ED Provider Notes (Signed)
CSN: [id number]     Arrival date & time 09/02/15  0404 History   First MD Initiated Contact with Patient 09/02/15 [phone]     Chief Complaint  Patient presents with  . Arm Pain      HPI  Pt was seen at 0445. Per EMS, NH report and pt, c/o gradual onset and persistence of constant right elbow "pain" that began yesterday. Pt states the pain starts in her elbow and radiates into her right wrist. Denies injury, no rash, no fevers, no focal motor weakness, no tingling/numbness in extremities.   Past Medical History  Diagnosis Date  . Hypertension   . Carcinoma of breast ([place])     left masectomy in 1995  . Atrial fibrillation ([place]) 2007    CHF with preserved LV systolic function - [id number]; moderate LVH  . Arteriosclerotic cardiovascular disease (ASCVD)     cath in 2001- 40% LAD and 50% RCA; stent for 80% LAD in 5/04; residual 50% LAD, 80% small D1, 70% small OM1 50% ostial RCA, nl EF   . MVP (mitral valve prolapse)     moderate; with moderate MR  . PVD (peripheral vascular disease) (HCC)     ABIs of 0.64 and 0.59, right and left leg in 2009  . CVD (cerebrovascular disease)     plaque w/o focal disease in 2006; h/o CVA  . Diabetes mellitus type II     no insulin   . Hypothyroid   . Osteoporosis   . Edema   . Herpes zoster   . Chronic hoarseness   . Bronchitis     history  . Varicose veins of legs 08/20/2011  . Lower extremity edema 08/20/2011  . Chronic diastolic heart failure ([place]) 11/21/2009  . 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 Shingles   . Ulcer     left lower leg  . Peripheral neuropathy [facility])    Past Surgical History  Procedure Laterality Date  . Orif of left hip  05/22/06    [name]  . Left masectomy  1995  . Cholecystectomy  2006  . Abdominal hysterectomy      abd?  . Cataract extraction, bilateral    . Colonoscopy  Date unknown  . Coronary angioplasty with stent placement     Family History  Problem Relation Age of Onset  . Diabetes Mother    Social History  Substance  Use Topics  . Smoking status: Never Smoker   . Smokeless tobacco: Never Used     Comment: tobacco use - no   . Alcohol Use: No    Review of Systems ROS: Statement: All systems negative except as marked or noted in the HPI; Constitutional: Negative for fever and chills. ; ; Eyes: Negative for eye pain, redness and discharge. ; ; ENMT: Negative for ear pain, hoarseness, nasal congestion, sinus pressure and sore throat. ; ; Cardiovascular: Negative for chest pain, palpitations, diaphoresis, dyspnea and peripheral edema. ; ; Respiratory: Negative for cough, wheezing and stridor. ; ; Gastrointestinal: Negative for nausea, vomiting, diarrhea, abdominal pain, blood in stool, hematemesis, jaundice and rectal bleeding. . ; ; Genitourinary: Negative for dysuria, flank pain and hematuria. ; ; Musculoskeletal: +right elbow pain. Negative for back pain and neck pain. Negative for swelling and trauma.; ; Skin: Negative for pruritus, rash, abrasions, blisters, bruising and skin lesion.; ; Neuro: Negative for headache, lightheadedness and neck stiffness. Negative for weakness, altered level of consciousness , altered mental status, extremity weakness, paresthesias, involuntary movement, seizure and syncope.  Allergies  Review of patient's allergies indicates no known allergies.  Home Medications   Prior to Admission medications   Medication Sig Start Date End Date Taking? Authorizing Provider  Acetaminophen 500 MG coapsule Take 250 mg by mouth daily as needed for fever or pain.   Yes Historical Provider, MD  atorvastatin (LIPITOR) 20 MG tablet Take 20 mg by mouth daily.   Yes Historical Provider, MD  digoxin (LANOXIN) 0.125 MG tablet Take 125 mcg by mouth daily. Takes after lunch   Yes Historical Provider, MD  furosemide (LASIX) 20 MG tablet Take 20 mg by mouth every morning.    Yes Historical Provider, MD  gabapentin (NEURONTIN) 100 MG capsule Take 100 mg by mouth 3 (three) times daily.   Yes Historical  Provider, MD  glipiZIDE (GLUCOTROL) 5 MG tablet Take 5 mg by mouth every morning.    Yes Historical Provider, MD  levothyroxine (SYNTHROID, LEVOTHROID) 25 MCG tablet Take 25 mcg by mouth every morning.    Yes Historical Provider, MD  metFORMIN (GLUCOPHAGE) 500 MG tablet Take 1,000 mg by mouth 2 (two) times daily with a meal.    Yes Historical Provider, MD  nebivolol (BYSTOLIC) 10 MG tablet Take 10 mg by mouth every morning.    Yes Historical Provider, MD  psyllium (HYDROCIL/METAMUCIL) 95 % PACK Take 1 packet by mouth daily. 04/13/14  Yes Lezlie Octave Black, NP  traMADol (ULTRAM) 50 MG tablet Take by mouth every 12 (twelve) hours as needed.   Yes Historical Provider, MD  beta carotene w/minerals (OCUVITE) tablet Take 1 tablet by mouth daily.      Historical Provider, MD  Calcium Carbonate-Vitamin D (CALCARB 600/D PO) Take 1 tablet by mouth at bedtime.    Historical Provider, MD  diltiazem (DILACOR XR) 180 MG 24 hr capsule Take 1 capsule (180 mg total) by mouth at bedtime. 04/22/14   Isaiah Serge, NP  losartan (COZAAR) 50 MG tablet Take 50 mg by mouth daily. Daily after lunch    Historical Provider, MD  Multiple Vitamins-Minerals (CENTRUM) tablet Take 1 tablet by mouth daily.    Historical Provider, MD  omeprazole (PRILOSEC) 20 MG capsule Take 20 mg by mouth 2 (two) times daily.      Historical Provider, MD  raloxifene (EVISTA) 60 MG tablet Take 60 mg by mouth every morning.    Historical Provider, MD  rosuvastatin (CRESTOR) 10 MG tablet Take 10 mg by mouth daily.    Historical Provider, MD  traMADol-acetaminophen (ULTRACET) 37.5-325 MG per tablet Take 1 tablet by mouth every 4 (four) hours as needed for moderate pain. 04/13/14   Radene Gunning, NP  warfarin (COUMADIN) 2 MG tablet Take 2-3 mg by mouth at bedtime. Takes one tablet daily on Sun, Tues, Wed, Fri and Sat.  Takes one and one-half tablet on Mon and Thurs.    Historical Provider, MD   BP 209/82 mmHg  Pulse 92  Temp(Src) 99 F (37.2 C) (Oral)   Resp 20  Ht 5' (1.524 m)  Wt 126 lb (57.153 kg)  BMI 24.61 kg/m2  SpO2 96% Physical Exam  0450: Physical examination:  Nursing notes reviewed; Vital signs and O2 SAT reviewed;  Constitutional: Well developed, Well nourished, Well hydrated, In no acute distress; Head:  Normocephalic, atraumatic; Eyes: EOMI, PERRL, No scleral icterus; ENMT: Mouth and pharynx normal, Mucous membranes moist; Neck: Supple, Full range of motion, No lymphadenopathy; Cardiovascular: Regular rate and rhythm, No gallop; Respiratory: Breath sounds clear & equal bilaterally, No wheezes.  Speaking full sentences with ease, Normal respiratory effort/excursion; Chest: Nontender, Movement normal; Abdomen: Soft, Nontender, Nondistended, Normal bowel sounds; Genitourinary: No CVA tenderness; Extremities: +mild TTP right lateral elbow area with mild localized edema, no open wounds, no deformity, no soft tissue crepitus, no ecchymosis. Right forearm muscles compartments soft. Strong radial pulse. NT right shoulder/wrist/hand. Pt c/o pain with pronation/supination right elbow, denies pain with F/E.; Neuro: AA&Ox3, Major CN grossly intact.  Speech clear. No gross focal motor or sensory deficits in extremities.; Skin: Color normal, Warm, Dry.   ED Course  Procedures (including critical care time) Labs Review Imaging Review  I have personally reviewed and evaluated these images and lab results as part of my medical decision-making.   EKG Interpretation None      MDM  MDM Reviewed: previous chart, nursing note and vitals Interpretation: x-ray   Dg Elbow Complete Right 09/02/2015  CLINICAL DATA:  Right elbow pain, no known injury, initial encounter EXAM: RIGHT ELBOW - COMPLETE 3+ VIEW COMPARISON:  None. FINDINGS: Generalized soft tissue swelling is noted about the elbow joint. Degenerative changes about the radial head are seen. A joint effusion is noted with elevation of both the anterior and posterior fat pads. No definitive  fracture is seen. IMPRESSION: Joint effusion and degenerative changes with associated soft tissue swelling. No acute bony abnormality is seen. Electronically Signed   By: Inez Catalina M.D.   On: 09/02/2015 07:37   Dg Wrist Complete Right 09/02/2015  CLINICAL DATA:  Right wrist pain EXAM: RIGHT WRIST - COMPLETE 3+ VIEW COMPARISON:  None. FINDINGS: Degenerative changes are noted at the first [facility] joint as well as in the radiocarpal articulation. No acute fracture or dislocation is seen. No gross soft tissue abnormality is noted. IMPRESSION: Degenerative change without acute abnormality. Electronically Signed   By: Inez Catalina M.D.   On: 09/02/2015 07:39    0745:  Will place in splint, sling, f/u Ortho MD. Dx and testing d/w pt.  Questions answered.  Verb understanding, agreeable to d/c back to NH with outpt f/u with Ortho.    Francine Graven, DO 09/05/15 1351

## 2015-09-02 NOTE — ED Notes (Signed)
Pt c/o pain to right elbow that radiates down to her finger tips.

## 2015-09-02 NOTE — ED Notes (Signed)
Discharge information given to 3rd shift nurse at [facility] (Nurse called ED).

## 2015-09-05 ENCOUNTER — Ambulatory Visit: Payer: PRIVATE HEALTH INSURANCE | Admitting: Orthopedic Surgery

## 2015-11-30 ENCOUNTER — Telehealth: Payer: Self-pay | Admitting: Internal Medicine

## 2015-11-30 ENCOUNTER — Encounter: Payer: Self-pay | Admitting: Internal Medicine

## 2015-11-30 NOTE — Telephone Encounter (Signed)
Dominica Severin NP w/ [facility] called concerning this pt's coumadin, states the pt has been hard to control for the last 6-8 wks. Suanne Marker would like to speak w/ someone about possibly switching her to Eliquis, she can be reached at [phone]

## 2015-11-30 NOTE — Telephone Encounter (Signed)
This will need to be determined by MD.

## 2015-12-04 ENCOUNTER — Ambulatory Visit (INDEPENDENT_AMBULATORY_CARE_PROVIDER_SITE_OTHER): Payer: Medicare Other | Admitting: Internal Medicine

## 2015-12-04 ENCOUNTER — Encounter: Payer: Self-pay | Admitting: Internal Medicine

## 2015-12-04 VITALS — BP 180/80 | HR 60 | Ht 64.0 in | Wt 124.0 lb

## 2015-12-04 DIAGNOSIS — I10 Essential (primary) hypertension: Secondary | ICD-10-CM | POA: Diagnosis not present

## 2015-12-04 DIAGNOSIS — I480 Paroxysmal atrial fibrillation: Secondary | ICD-10-CM

## 2015-12-04 NOTE — Patient Instructions (Signed)
Your physician wants you to follow-up in: 6 months with Dr Ross You will receive a reminder letter in the mail two months in advance. If you don't receive a letter, please call our office to schedule the follow-up appointment.    Your physician recommends that you continue on your current medications as directed. Please refer to the Current Medication list given to you today.    Thank you for choosing [facility] !        

## 2015-12-04 NOTE — Progress Notes (Signed)
Cardiology Office Note   Date:  12/04/2015   ID:  JEWELL AMBRIZ, DOB 01/26/1921, MRN [id number]  PCP:  Wende Neighbors, MD  Cardiologist:   Dorris Carnes, MD   F?U of atrial fib      History of Present Illness: [name] is a 82 y.o. female with a history of atrial fib  i saw hier in June 2016  Alos has intermitt weak spells  Not clearly cardiac   Since seen she denies CP  Breathing is OK Occasional palpitations   No falls           Outpatient Prescriptions Prior to Visit  Medication Sig Dispense Refill  . Acetaminophen 500 MG coapsule Take 250 mg by mouth daily as needed for fever or pain.    Marland Kitchen atorvastatin (LIPITOR) 20 MG tablet Take 20 mg by mouth daily.    . beta carotene w/minerals (OCUVITE) tablet Take 1 tablet by mouth daily.      . Calcium Carbonate-Vitamin D (CALCARB 600/D PO) Take 1 tablet by mouth at bedtime.    . digoxin (LANOXIN) 0.125 MG tablet Take 125 mcg by mouth daily. Takes after lunch    . furosemide (LASIX) 20 MG tablet Take 20 mg by mouth every morning.     . gabapentin (NEURONTIN) 100 MG capsule Take 100 mg by mouth 3 (three) times daily.    Marland Kitchen glipiZIDE (GLUCOTROL) 5 MG tablet Take 5 mg by mouth every morning.     Marland Kitchen levothyroxine (SYNTHROID, LEVOTHROID) 25 MCG tablet Take 25 mcg by mouth every morning.     . Multiple Vitamins-Minerals (CENTRUM) tablet Take 1 tablet by mouth daily.    . nebivolol (BYSTOLIC) 10 MG tablet Take 10 mg by mouth every morning.     . warfarin (COUMADIN) 2 MG tablet Take 2-3 mg by mouth at bedtime. Takes one tablet daily on Sun, Tues, Wed, Fri and Sat.  Takes one and one-half tablet on Mon and Thurs.    . raloxifene (EVISTA) 60 MG tablet Take 60 mg by mouth every morning.    . diltiazem (DILACOR XR) 180 MG 24 hr capsule Take 1 capsule (180 mg total) by mouth at bedtime. 90 capsule 4  . losartan (COZAAR) 50 MG tablet Take 50 mg by mouth daily. Daily after lunch    . metFORMIN (GLUCOPHAGE) 500 MG tablet Take 1,000 mg by mouth 2 (two)  times daily with a meal.     . omeprazole (PRILOSEC) 20 MG capsule Take 20 mg by mouth 2 (two) times daily.      . psyllium (HYDROCIL/METAMUCIL) 95 % PACK Take 1 packet by mouth daily. 56 each 0  . rosuvastatin (CRESTOR) 10 MG tablet Take 10 mg by mouth daily.    . traMADol (ULTRAM) 50 MG tablet Take by mouth every 12 (twelve) hours as needed.    . traMADol-acetaminophen (ULTRACET) 37.5-325 MG per tablet Take 1 tablet by mouth every 4 (four) hours as needed for moderate pain. 30 tablet 0   No facility-administered medications prior to visit.     Allergies:   Review of patient's allergies indicates no known allergies.   Past Medical History  Diagnosis Date  . Hypertension   . Carcinoma of breast ([place])     left masectomy in 1995  . Atrial fibrillation ([place]) 2007    CHF with preserved LV systolic function - [id number]; moderate LVH  . Arteriosclerotic cardiovascular disease (ASCVD)     cath in 2001- 40% LAD and 50%  RCA; stent for 80% LAD in 5/04; residual 50% LAD, 80% small D1, 70% small OM1 50% ostial RCA, nl EF   . MVP (mitral valve prolapse)     moderate; with moderate MR  . PVD (peripheral vascular disease) (HCC)     ABIs of 0.64 and 0.59, right and left leg in 2009  . CVD (cerebrovascular disease)     plaque w/o focal disease in 2006; h/o CVA  . Diabetes mellitus type II     no insulin   . Hypothyroid   . Osteoporosis   . Edema   . Herpes zoster   . Chronic hoarseness   . Bronchitis     history  . Varicose veins of legs 08/20/2011  . Lower extremity edema 08/20/2011  . Chronic diastolic heart failure ([place]) 11/21/2009  . 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 Shingles   . Ulcer     left lower leg  . Peripheral neuropathy [facility])     Past Surgical History  Procedure Laterality Date  . Orif of left hip  05/22/06    [name]  . Left masectomy  1995  . Cholecystectomy  2006  . Abdominal hysterectomy      abd?  . Cataract extraction, bilateral    . Colonoscopy  Date unknown  . Coronary  angioplasty with stent placement       Social History:  The patient  reports that she has never smoked. She has never used smokeless tobacco. She reports that she does not drink alcohol or use illicit drugs.   Family History:  The patient's family history includes Diabetes in her mother.    ROS:  Please see the history of present illness. All other systems are reviewed and  Negative to the above problem except as noted.    PHYSICAL EXAM: VS:  BP 180/80 mmHg  Pulse 60  Ht 5\' 4"  (1.626 m)  Wt 124 lb (56.246 kg)  BMI 21.27 kg/m2  SpO2 97%  GEN: Well nourished, well developed, in no acute distress HEENT: normal Neck: no JVD, carotid bruits, or masses Cardiac: Irreg Irreg   no murmurs, rubs, or gallops, Tr edema  Respiratory:  clear to auscultation bilaterally, normal work of breathing GI: soft, nontender, nondistended, + BS  No hepatomegaly  MS: no deformity Moving all extremities   Skin: warm and dry, no rash Neuro:  Strength and sensation are intact Psych: euthymic mood, full affect   EKG:  EKG is not ordered today.   Lipid Panel    Component Value Date/Time   CHOL  10/19/2010 0615    120        ATP III CLASSIFICATION:  <200     mg/dL   Desirable  200-239  mg/dL   Borderline High  >=240    mg/dL   High          TRIG 74 10/19/2010 0615   HDL 39* 10/19/2010 0615   CHOLHDL 3.1 10/19/2010 0615   VLDL 15 10/19/2010 0615   LDLCALC  10/19/2010 0615    66        Total Cholesterol/HDL:CHD Risk Coronary Heart Disease Risk Table                     Men   Women  1/2 Average Risk   3.4   3.3  Average Risk       5.0   4.4  2 X Average Risk   9.6   7.1  3 X Average  Risk  23.4   11.0        Use the calculated Patient Ratio above and the CHD Risk Table to determine the patient's CHD Risk.        ATP III CLASSIFICATION (LDL):  <100     mg/dL   Optimal  100-129  mg/dL   Near or Above                    Optimal  130-159  mg/dL   Borderline  160-189  mg/dL   High  >190      mg/dL   Very High      Wt Readings from Last 3 Encounters:  12/04/15 124 lb (56.246 kg)  09/02/15 126 lb (57.153 kg)  01/26/15 124 lb (56.246 kg)      ASSESSMENT AND PLAN:  1  Atrial fib  PAF  APpears to be in afib on exam  Rates good  Will follow  Disucss anticoag with Suanne Marker  Last INR 3.5  2  HTN  BP is high  Did not get meds yet today  Was high on one other visit to [facility] Would like to have BP and P checked and faxed her over the next few wks  Will f/u in 3 wks    Signed, Dorris Carnes, MD  12/04/2015 10:03 AM    [address], [place], [place]  [id number] Phone: [phone]; Fax: [phone]

## 2016-01-16 ENCOUNTER — Telehealth: Payer: Self-pay | Admitting: *Deleted

## 2016-01-16 NOTE — Telephone Encounter (Signed)
Message     Please make f/u appt with me for this summer    Staff message sent to scheduler in [place] to set patient up at next available with Dr. Harrington Challenger.

## 2016-02-02 ENCOUNTER — Ambulatory Visit: Payer: PRIVATE HEALTH INSURANCE | Admitting: Internal Medicine

## 2016-02-19 ENCOUNTER — Encounter: Payer: Self-pay | Admitting: Internal Medicine

## 2016-02-19 ENCOUNTER — Ambulatory Visit (INDEPENDENT_AMBULATORY_CARE_PROVIDER_SITE_OTHER): Payer: Medicare Other | Admitting: Internal Medicine

## 2016-02-19 VITALS — BP 190/94 | HR 63 | Ht 63.0 in | Wt 121.0 lb

## 2016-02-19 DIAGNOSIS — I10 Essential (primary) hypertension: Secondary | ICD-10-CM

## 2016-02-19 NOTE — Progress Notes (Addendum)
Cardiology Office Note   Date:  02/19/2016   ID:  KYLIA BRUBAKER, DOB 1921/05/17, MRN [id number]  PCP:  Wende Neighbors, MD  Cardiologist:   Dorris Carnes, MD   No chief complaint on file.     History of Present Illness: [name] is a 83 y.o. female with a history of intermitt afib and HTN  I saw her in April 2017  At that time her bp was high  She had not taken fmeds that day         Outpatient Prescriptions Prior to Visit  Medication Sig Dispense Refill  . Acetaminophen 500 MG coapsule Take 250 mg by mouth daily as needed for fever or pain.    Marland Kitchen atorvastatin (LIPITOR) 20 MG tablet Take 20 mg by mouth daily.    . beta carotene w/minerals (OCUVITE) tablet Take 1 tablet by mouth daily.      . Calcium Carbonate-Vitamin D (CALCARB 600/D PO) Take 1 tablet by mouth at bedtime.    . digoxin (LANOXIN) 0.125 MG tablet Take 125 mcg by mouth daily. Takes after lunch    . diltiazem (TIAZAC) 240 MG 24 hr capsule Take 240 mg by mouth daily.    . furosemide (LASIX) 20 MG tablet Take 20 mg by mouth every morning.     . gabapentin (NEURONTIN) 100 MG capsule Take 100 mg by mouth 3 (three) times daily.    Marland Kitchen glipiZIDE (GLUCOTROL) 5 MG tablet Take 5 mg by mouth every morning.     Marland Kitchen GLIPIZIDE XL 5 MG 24 hr tablet     . levothyroxine (SYNTHROID, LEVOTHROID) 25 MCG tablet Take 25 mcg by mouth every morning.     . metFORMIN (GLUCOPHAGE) 1000 MG tablet     . Multiple Vitamins-Minerals (CENTRUM) tablet Take 1 tablet by mouth daily.    . nebivolol (BYSTOLIC) 10 MG tablet Take 10 mg by mouth every morning.     . raloxifene (EVISTA) 60 MG tablet Take 60 mg by mouth every morning.    . warfarin (COUMADIN) 2 MG tablet Take 2-3 mg by mouth at bedtime. Takes one tablet daily on Sun, Tues, Wed, Fri and Sat.  Takes one and one-half tablet on Mon and Thurs.     No facility-administered medications prior to visit.     Allergies:   Review of patient's allergies indicates no known allergies.   Past Medical History   Diagnosis Date  . Hypertension   . Carcinoma of breast ([place])     left masectomy in 1995  . Atrial fibrillation ([place]) 2007    CHF with preserved LV systolic function - [id number]; moderate LVH  . Arteriosclerotic cardiovascular disease (ASCVD)     cath in 2001- 40% LAD and 50% RCA; stent for 80% LAD in 5/04; residual 50% LAD, 80% small D1, 70% small OM1 50% ostial RCA, nl EF   . MVP (mitral valve prolapse)     moderate; with moderate MR  . PVD (peripheral vascular disease) (HCC)     ABIs of 0.64 and 0.59, right and left leg in 2009  . CVD (cerebrovascular disease)     plaque w/o focal disease in 2006; h/o CVA  . Diabetes mellitus type II     no insulin   . Hypothyroid   . Osteoporosis   . Edema   . Herpes zoster   . Chronic hoarseness   . Bronchitis     history  . Varicose veins of legs 08/20/2011  . Lower extremity  edema 08/20/2011  . Chronic diastolic heart failure ([place]) 11/21/2009  . 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 Shingles   . Ulcer     left lower leg  . Peripheral neuropathy [facility])     Past Surgical History  Procedure Laterality Date  . Orif of left hip  05/22/06    [name]  . Left masectomy  1995  . Cholecystectomy  2006  . Abdominal hysterectomy      abd?  . Cataract extraction, bilateral    . Colonoscopy  Date unknown  . Coronary angioplasty with stent placement       Social History:  The patient  reports that she has never smoked. She has never used smokeless tobacco. She reports that she does not drink alcohol or use illicit drugs.   Family History:  The patient's family history includes Diabetes in her mother.    ROS:  Please see the history of present illness. All other systems are reviewed and  Negative to the above problem except as noted.    PHYSICAL EXAM: VS:  Pulse 63  Ht 5\' 3"  (1.6 m)  Wt 121 lb (54.885 kg)  BMI 21.44 kg/m2  SpO2 98%  GEN: Well nourished, well developed, in no acute distress HEENT: normal Neck: no JVD, carotid bruits, or  masses Cardiac: RRR; no murmurs, rubs, or gallops,no edema  Respiratory:  clear to auscultation bilaterally, normal work of breathing GI: soft, nontender, nondistended, + BS  No hepatomegaly  MS: no deformity Moving all extremities   Skin: warm and dry, no rash Neuro:  Strength and sensation are intact Psych: euthymic mood, full affect   EKG:  EKG is ordered today.   Lipid Panel    Component Value Date/Time   CHOL  10/19/2010 0615    120        ATP III CLASSIFICATION:  <200     mg/dL   Desirable  200-239  mg/dL   Borderline High  >=240    mg/dL   High          TRIG 74 10/19/2010 0615   HDL 39* 10/19/2010 0615   CHOLHDL 3.1 10/19/2010 0615   VLDL 15 10/19/2010 0615   LDLCALC  10/19/2010 0615    66        Total Cholesterol/HDL:CHD Risk Coronary Heart Disease Risk Table                     Men   Women  1/2 Average Risk   3.4   3.3  Average Risk       5.0   4.4  2 X Average Risk   9.6   7.1  3 X Average Risk  23.4   11.0        Use the calculated Patient Ratio above and the CHD Risk Table to determine the patient's CHD Risk.        ATP III CLASSIFICATION (LDL):  <100     mg/dL   Optimal  100-129  mg/dL   Near or Above                    Optimal  130-159  mg/dL   Borderline  160-189  mg/dL   High  >190     mg/dL   Very High      Wt Readings from Last 3 Encounters:  02/19/16 121 lb (54.885 kg)  12/04/15 124 lb (56.246 kg)  09/02/15 126 lb (57.153 kg)  ASSESSMENT AND PLAN:     Current medicines are reviewed at length with the patient today.  The patient does not have concerns regarding medicines.  The following changes have been made:   Labs/ tests ordered today include: Orders Placed This Encounter  Procedures  . EKG 12-Lead     Disposition:   FU with  in   Signed, Dorris Carnes, MD  02/19/2016 1:08 PM    [facility] [place], [place], [place]  [address] Phone: [phone]; Fax: [phone]     Cardiology  Office Note   Date:  02/19/2016   ID:  [name], DOB 05/27/1921, MRN [id number]  PCP:  Wende Neighbors, MD  Cardiologist:   Dorris Carnes, MD    F/U of HTN     History of Present Illness: [name] is a 82 y.o. female with a history of HTN and PAF  I saw her in April   At that time her BP was high but she says that she had not gotten meds that day   She says that she usually gets meds at night   Notes no CP  ,Limited activty       Outpatient Prescriptions Prior to Visit  Medication Sig Dispense Refill  . Acetaminophen 500 MG coapsule Take 250 mg by mouth daily as needed for fever or pain.    Marland Kitchen atorvastatin (LIPITOR) 20 MG tablet Take 20 mg by mouth daily.    . beta carotene w/minerals (OCUVITE) tablet Take 1 tablet by mouth daily.      . Calcium Carbonate-Vitamin D (CALCARB 600/D PO) Take 1 tablet by mouth at bedtime.    . digoxin (LANOXIN) 0.125 MG tablet Take 125 mcg by mouth daily. Takes after lunch    . diltiazem (TIAZAC) 240 MG 24 hr capsule Take 240 mg by mouth daily.    . furosemide (LASIX) 20 MG tablet Take 20 mg by mouth every morning.     . gabapentin (NEURONTIN) 100 MG capsule Take 100 mg by mouth 3 (three) times daily.    Marland Kitchen glipiZIDE (GLUCOTROL) 5 MG tablet Take 5 mg by mouth every morning.     Marland Kitchen GLIPIZIDE XL 5 MG 24 hr tablet     . levothyroxine (SYNTHROID, LEVOTHROID) 25 MCG tablet Take 25 mcg by mouth every morning.     . metFORMIN (GLUCOPHAGE) 1000 MG tablet     . Multiple Vitamins-Minerals (CENTRUM) tablet Take 1 tablet by mouth daily.    . nebivolol (BYSTOLIC) 10 MG tablet Take 10 mg by mouth every morning.     . raloxifene (EVISTA) 60 MG tablet Take 60 mg by mouth every morning.    . warfarin (COUMADIN) 2 MG tablet Take 2-3 mg by mouth at bedtime. Takes one tablet daily on Sun, Tues, Wed, Fri and Sat.  Takes one and one-half tablet on Mon and Thurs.     No facility-administered medications prior to visit.     Allergies:   Review of patient's allergies  indicates no known allergies.   Past Medical History  Diagnosis Date  . Hypertension   . Carcinoma of breast ([place])     left masectomy in 1995  . Atrial fibrillation ([place]) 2007    CHF with preserved LV systolic function - [id number]; moderate LVH  . Arteriosclerotic cardiovascular disease (ASCVD)     cath in 2001- 40% LAD and 50% RCA; stent for 80% LAD in 5/04; residual 50% LAD, 80% small D1, 70%  small OM1 50% ostial RCA, nl EF   . MVP (mitral valve prolapse)     moderate; with moderate MR  . PVD (peripheral vascular disease) (HCC)     ABIs of 0.64 and 0.59, right and left leg in 2009  . CVD (cerebrovascular disease)     plaque w/o focal disease in 2006; h/o CVA  . Diabetes mellitus type II     no insulin   . Hypothyroid   . Osteoporosis   . Edema   . Herpes zoster   . Chronic hoarseness   . Bronchitis     history  . Varicose veins of legs 08/20/2011  . Lower extremity edema 08/20/2011  . Chronic diastolic heart failure ([place]) 11/21/2009  . 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 Shingles   . Ulcer     left lower leg  . Peripheral neuropathy [facility])     Past Surgical History  Procedure Laterality Date  . Orif of left hip  05/22/06    [name]  . Left masectomy  1995  . Cholecystectomy  2006  . Abdominal hysterectomy      abd?  . Cataract extraction, bilateral    . Colonoscopy  Date unknown  . Coronary angioplasty with stent placement       Social History:  The patient  reports that she has never smoked. She has never used smokeless tobacco. She reports that she does not drink alcohol or use illicit drugs.   Family History:  The patient's family history includes Diabetes in her mother.    ROS:  Please see the history of present illness. All other systems are reviewed and  Negative to the above problem except as noted.    PHYSICAL EXAM: VS:  Pulse 63  Ht 5\' 3"  (1.6 m)  Wt 121 lb (54.885 kg)  BMI 21.44 kg/m2  SpO2 98%  GEN: thin 82 yo , in no acute distress HEENT: normal Neck: no  JVD, carotid bruits, or masses Cardiac: RRR; no murmurs, rubs, or gallops,no edema  Respiratory:  clear to auscultation bilaterally, normal work of breathing GI: soft, nontender, nondistended, + BS  No hepatomegaly  MS: no deformity Moving all extremities   Skin: warm and dry, no rash Neuro:  Strength and sensation are intact Psych: euthymic mood, full affect   EKG:  EKG is not ordered today.  Atrial fib  73 bpm  Possible anteiror Wall MI     Lipid Panel    Component Value Date/Time   CHOL  10/19/2010 0615    120        ATP III CLASSIFICATION:  <200     mg/dL   Desirable  200-239  mg/dL   Borderline High  >=240    mg/dL   High          TRIG 74 10/19/2010 0615   HDL 39* 10/19/2010 0615   CHOLHDL 3.1 10/19/2010 0615   VLDL 15 10/19/2010 0615   LDLCALC  10/19/2010 0615    66        Total Cholesterol/HDL:CHD Risk Coronary Heart Disease Risk Table                     Men   Women  1/2 Average Risk   3.4   3.3  Average Risk       5.0   4.4  2 X Average Risk   9.6   7.1  3 X Average Risk  23.4   11.0  Use the calculated Patient Ratio above and the CHD Risk Table to determine the patient's CHD Risk.        ATP III CLASSIFICATION (LDL):  <100     mg/dL   Optimal  100-129  mg/dL   Near or Above                    Optimal  130-159  mg/dL   Borderline  160-189  mg/dL   High  >190     mg/dL   Very High      Wt Readings from Last 3 Encounters:  02/19/16 121 lb (54.885 kg)  12/04/15 124 lb (56.246 kg)  09/02/15 126 lb (57.153 kg)      ASSESSMENT AND PLAN:  1  HTN  BP is still up  I would recomm daily checks  I would also recomm labs (BMET, CBC and TSH ) today  She should get BMET and CBC  WIll review labs and BP when come in ( Instructed A[place] to check BP and recor) Before switching meds.    F/U in 8 wks     Signed, Dorris Carnes, MD  02/19/2016 1:08 PM    [facility] [place], [place], [place]  [address] Phone: [phone];  Fax: [phone]

## 2016-02-19 NOTE — Patient Instructions (Addendum)
Your physician recommends that you schedule a follow-up appointment in: 6 weeks dr Harrington Challenger   Please get lab work drawn per MD order and fax to [place] [phone]            Please record weekly Blood pressures        Thank you for choosing [facility] !

## 2016-03-17 ENCOUNTER — Inpatient Hospital Stay (HOSPITAL_COMMUNITY)
Admission: EM | Admit: 2016-03-17 | Discharge: 2016-03-21 | DRG: 291 | Disposition: A | Discharge status: Skilled Nursing Facility | Payer: Medicare Other | Attending: Internal Medicine | Admitting: Internal Medicine

## 2016-03-17 ENCOUNTER — Emergency Department (HOSPITAL_COMMUNITY): Payer: Medicare Other

## 2016-03-17 ENCOUNTER — Encounter (HOSPITAL_COMMUNITY): Payer: Self-pay | Admitting: Emergency Medicine

## 2016-03-17 DIAGNOSIS — G3183 Dementia with Lewy bodies: Secondary | ICD-10-CM | POA: Diagnosis present

## 2016-03-17 DIAGNOSIS — Z853 Personal history of malignant neoplasm of breast: Secondary | ICD-10-CM

## 2016-03-17 DIAGNOSIS — E039 Hypothyroidism, unspecified: Secondary | ICD-10-CM | POA: Diagnosis present

## 2016-03-17 DIAGNOSIS — Z9049 Acquired absence of other specified parts of digestive tract: Secondary | ICD-10-CM

## 2016-03-17 DIAGNOSIS — I4891 Unspecified atrial fibrillation: Secondary | ICD-10-CM | POA: Diagnosis present

## 2016-03-17 DIAGNOSIS — Z7901 Long term (current) use of anticoagulants: Secondary | ICD-10-CM | POA: Diagnosis not present

## 2016-03-17 DIAGNOSIS — F0280 Dementia in other diseases classified elsewhere without behavioral disturbance: Secondary | ICD-10-CM | POA: Diagnosis present

## 2016-03-17 DIAGNOSIS — I482 Chronic atrial fibrillation: Secondary | ICD-10-CM | POA: Diagnosis present

## 2016-03-17 DIAGNOSIS — Z955 Presence of coronary angioplasty implant and graft: Secondary | ICD-10-CM | POA: Diagnosis not present

## 2016-03-17 DIAGNOSIS — R140 Abdominal distension (gaseous): Secondary | ICD-10-CM

## 2016-03-17 DIAGNOSIS — I739 Peripheral vascular disease, unspecified: Secondary | ICD-10-CM | POA: Diagnosis present

## 2016-03-17 DIAGNOSIS — I10 Essential (primary) hypertension: Secondary | ICD-10-CM | POA: Diagnosis not present

## 2016-03-17 DIAGNOSIS — J9600 Acute respiratory failure, unspecified whether with hypoxia or hypercapnia: Secondary | ICD-10-CM | POA: Diagnosis not present

## 2016-03-17 DIAGNOSIS — E1142 Type 2 diabetes mellitus with diabetic polyneuropathy: Secondary | ICD-10-CM | POA: Diagnosis present

## 2016-03-17 DIAGNOSIS — I5033 Acute on chronic diastolic (congestive) heart failure: Secondary | ICD-10-CM | POA: Diagnosis present

## 2016-03-17 DIAGNOSIS — I2510 Atherosclerotic heart disease of native coronary artery without angina pectoris: Secondary | ICD-10-CM | POA: Diagnosis present

## 2016-03-17 DIAGNOSIS — I110 Hypertensive heart disease with heart failure: Secondary | ICD-10-CM | POA: Diagnosis present

## 2016-03-17 DIAGNOSIS — E871 Hypo-osmolality and hyponatremia: Secondary | ICD-10-CM | POA: Diagnosis present

## 2016-03-17 DIAGNOSIS — J9601 Acute respiratory failure with hypoxia: Secondary | ICD-10-CM | POA: Diagnosis present

## 2016-03-17 DIAGNOSIS — Z9012 Acquired absence of left breast and nipple: Secondary | ICD-10-CM | POA: Diagnosis not present

## 2016-03-17 DIAGNOSIS — Z833 Family history of diabetes mellitus: Secondary | ICD-10-CM

## 2016-03-17 DIAGNOSIS — M1711 Unilateral primary osteoarthritis, right knee: Secondary | ICD-10-CM | POA: Diagnosis present

## 2016-03-17 DIAGNOSIS — J810 Acute pulmonary edema: Secondary | ICD-10-CM | POA: Diagnosis present

## 2016-03-17 DIAGNOSIS — E785 Hyperlipidemia, unspecified: Secondary | ICD-10-CM | POA: Diagnosis present

## 2016-03-17 DIAGNOSIS — R0602 Shortness of breath: Secondary | ICD-10-CM

## 2016-03-17 DIAGNOSIS — I509 Heart failure, unspecified: Secondary | ICD-10-CM | POA: Diagnosis not present

## 2016-03-17 DIAGNOSIS — E559 Vitamin D deficiency, unspecified: Secondary | ICD-10-CM | POA: Diagnosis present

## 2016-03-17 DIAGNOSIS — M810 Age-related osteoporosis without current pathological fracture: Secondary | ICD-10-CM | POA: Diagnosis present

## 2016-03-17 DIAGNOSIS — I169 Hypertensive crisis, unspecified: Secondary | ICD-10-CM

## 2016-03-17 DIAGNOSIS — I480 Paroxysmal atrial fibrillation: Secondary | ICD-10-CM | POA: Diagnosis not present

## 2016-03-17 LAB — BASIC METABOLIC PANEL
Anion gap: 7 (ref 5–15)
BUN: 14 mg/dL (ref 6–20)
CHLORIDE: 95 mmol/L — AB (ref 101–111)
CO2: 24 mmol/L (ref 22–32)
CREATININE: 0.65 mg/dL (ref 0.44–1.00)
Calcium: 8.1 mg/dL — ABNORMAL LOW (ref 8.9–10.3)
GFR calc Af Amer: >60 mL/min (ref >60)
GFR calc non Af Amer: >60 mL/min (ref >60)
Glucose, Bld: 337 mg/dL — ABNORMAL HIGH (ref 65–99)
Potassium: 3.8 mmol/L (ref 3.5–5.1)
SODIUM: 126 mmol/L — AB (ref 135–145)

## 2016-03-17 LAB — DIGOXIN LEVEL: Digoxin Level: 0.8 ng/mL (ref 0.8–2.0)

## 2016-03-17 LAB — CBC
HCT: 42.1 % (ref 36.0–46.0)
HEMOGLOBIN: 13.8 g/dL (ref 12.0–15.0)
MCH: 28.6 pg (ref 26.0–34.0)
MCHC: 32.8 g/dL (ref 30.0–36.0)
MCV: 87.2 fL (ref 78.0–100.0)
Platelets: 287 10*3/uL (ref 150–400)
RBC: 4.83 MIL/uL (ref 3.87–5.11)
RDW: 15.7 % — ABNORMAL HIGH (ref 11.5–15.5)
WBC: 12.4 10*3/uL — ABNORMAL HIGH (ref 4.0–10.5)

## 2016-03-17 LAB — PROTIME-INR
INR: 3.02
Prothrombin Time: 32 seconds — ABNORMAL HIGH (ref 11.4–15.2)

## 2016-03-17 LAB — TROPONIN I

## 2016-03-17 LAB — BRAIN NATRIURETIC PEPTIDE: B Natriuretic Peptide: 483 pg/mL — ABNORMAL HIGH (ref 0.0–100.0)

## 2016-03-17 MED ORDER — FUROSEMIDE 10 MG/ML IJ SOLN
40.0000 mg | Freq: Once | INTRAMUSCULAR | Status: AC
  Administered 2016-03-17: 40 mg via INTRAVENOUS
  Filled 2016-03-17: qty 4

## 2016-03-17 MED ORDER — NITROGLYCERIN IN D5W 200-5 MCG/ML-% IV SOLN
5.0000 ug/min | Freq: Once | INTRAVENOUS | Status: AC
  Administered 2016-03-17: 5 ug/min via INTRAVENOUS
  Filled 2016-03-17: qty 250

## 2016-03-17 MED ORDER — POTASSIUM CHLORIDE CRYS ER 20 MEQ PO TBCR
20.0000 meq | EXTENDED_RELEASE_TABLET | Freq: Once | ORAL | Status: AC
  Administered 2016-03-17: 20 meq via ORAL
  Filled 2016-03-17: qty 1

## 2016-03-17 NOTE — ED Provider Notes (Signed)
[facility] DEPT Provider Note   CSN: [id number] Arrival date & time: 03/17/16  1954  First Provider Contact:  First MD Initiated Contact with Patient 03/17/16 1958        History   Chief Complaint Chief Complaint  Patient presents with  . Shortness of Breath    HPI [name] is a 82 y.o. female.  The patient is a 82 year old female who comes from the nursing facility. She has a known history of vascular disease, she has had stenting of her left anterior descending several times, she also has atrial fibrillation and congestive heart failure with preserved LV function as of 2008. She has a distant history of carcinoma of the left breast, she is in remission as far she knows and had a mastectomy in 1995. She presents from her nursing facility with shortness of breath which was progressive over the last 2 days, became severe today and was not responding to albuterol treatments by the paramedics. She does not have a history of COPD or any other reactive airway disease. She does not smoke cigarettes. Her symptoms were severe, paramedics found the patient with oxygen in the 80% some placed her on a nonrebreather with albuterol treatment prehospital.      Past Medical History:  Diagnosis Date  . Arteriosclerotic cardiovascular disease (ASCVD)    cath in 2001- 40% LAD and 50% RCA; stent for 80% LAD in 5/04; residual 50% LAD, 80% small D1, 70% small OM1 50% ostial RCA, nl EF   . Atrial fibrillation ([place]) 2007   CHF with preserved LV systolic function - [id number]; moderate LVH  . Bronchitis    history  . Carcinoma of breast ([place])    left masectomy in 1995  . Chronic diastolic heart failure ([place]) 11/21/2009  . Chronic hoarseness   . CVD (cerebrovascular disease)    plaque w/o focal disease in 2006; h/o CVA  . Diabetes mellitus type II    no insulin   . Edema   . Herpes zoster   . Hypertension   . Hypothyroid   . Lower extremity edema 08/20/2011  . MVP (mitral valve prolapse)    moderate; with moderate MR  . Osteoporosis   . Peripheral neuropathy ([place])   . PVD (peripheral vascular disease) (HCC)    ABIs of 0.64 and 0.59, right and left leg in 2009  . 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 Shingles   . Ulcer    left lower leg  . Varicose veins of legs 08/20/2011    Patient Active Problem List   Diagnosis Date Noted  . Acute pulmonary edema ([place]) 03/17/2016  . Abdominal distention 04/13/2014  . Hip pain 04/12/2014  . Labral tear of hip, degenerative 04/12/2014  . Trochanteric bursitis of right hip 04/12/2014  . Encounter for therapeutic drug monitoring 10/06/2013  . Lower extremity edema 08/20/2011  . Arteriosclerotic cardiovascular disease (ASCVD)   . Carcinoma of breast ([place])   . Atrial fibrillation ([place])   . MVP (mitral valve prolapse)   . PVD (peripheral vascular disease) ([place])   . CVD (cerebrovascular disease)   . Hypothyroid   . Osteoporosis   . Chronic anticoagulation 11/22/2010  . DIABETES MELLITUS, TYPE II 11/21/2009  . Hyperlipidemia 11/21/2009  . Hypertension 11/21/2009  . SPINAL STENOSIS, LUMBAR 09/05/2008    Past Surgical History:  Procedure Laterality Date  . ABDOMINAL HYSTERECTOMY     abd?  . CATARACT EXTRACTION, BILATERAL    . CHOLECYSTECTOMY  2006  . COLONOSCOPY  Date  unknown  . CORONARY ANGIOPLASTY WITH STENT PLACEMENT    . left masectomy  1995  . ORIF of left hip  05/22/06   Darylene Price History    No data available       Home Medications    Prior to Admission medications   Medication Sig Start Date End Date Taking? Authorizing Provider  acetaminophen (TYLENOL) 325 MG tablet Take 650 mg by mouth 2 (two) times daily.   Yes Historical Provider, MD  atorvastatin (LIPITOR) 20 MG tablet Take 20 mg by mouth daily.   Yes Historical Provider, MD  carbamide peroxide (DEBROX) 6.5 % otic solution Place 2 drops into both ears every Sunday. ADMINISTERED AT BEDTIME ONCE WEEKLY-SUNDAYS   Yes Historical Provider, MD  digoxin (LANOXIN) 0.125  MG tablet Take 125 mcg by mouth daily. Takes after lunch   Yes Historical Provider, MD  diltiazem (TIAZAC) 240 MG 24 hr capsule Take 240 mg by mouth daily.   Yes Historical Provider, MD  furosemide (LASIX) 20 MG tablet Take 20 mg by mouth every morning.    Yes Historical Provider, MD  gabapentin (NEURONTIN) 100 MG capsule Take 100 mg by mouth 2 (two) times daily.    Yes Historical Provider, MD  levothyroxine (SYNTHROID, LEVOTHROID) 25 MCG tablet Take 25 mcg by mouth every evening.    Yes Historical Provider, MD  Liniments (SALONPAS EX) Apply 1 application topically daily as needed (APPLIED TO KNEE).   Yes Historical Provider, MD  nebivolol (BYSTOLIC) 10 MG tablet Take 10 mg by mouth every morning.    Yes Historical Provider, MD  ondansetron (ZOFRAN) 4 MG tablet Take 4 mg by mouth 2 (two) times daily.   Yes Historical Provider, MD  Probiotic Product (PROBIOTIC DAILY PO) Take 1 capsule by mouth daily.   Yes Historical Provider, MD  Vitamin D, Ergocalciferol, (DRISDOL) 50000 units CAPS capsule Take 50,000 Units by mouth every 30 (thirty) days.   Yes Historical Provider, MD  warfarin (COUMADIN) 2 MG tablet Take 2 mg by mouth every evening.    Yes Historical Provider, MD    Family History Family History  Problem Relation Age of Onset  . Diabetes Mother     Social History Social History  Substance Use Topics  . Smoking status: Never Smoker  . Smokeless tobacco: Never Used     Comment: tobacco use - no   . Alcohol use No     Allergies   Review of patient's allergies indicates no known allergies.   Review of Systems Review of Systems  All other systems reviewed and are negative.    Physical Exam Updated Vital Signs BP (!) 165/109   Pulse 63   Temp 97.6 F (36.4 C) (Oral)   Resp 16   Ht 5\' 3"  (1.6 m)   Wt 125 lb (56.7 kg)   SpO2 100%   BMI 22.14 kg/m   Physical Exam  Constitutional: She appears well-developed and well-nourished. She appears distressed.  HENT:  Head:  Normocephalic and atraumatic.  Mouth/Throat: Oropharynx is clear and moist. No oropharyngeal exudate.  Eyes: Conjunctivae and EOM are normal. Pupils are equal, round, and reactive to light. Right eye exhibits no discharge. Left eye exhibits no discharge. No scleral icterus.  Neck: Normal range of motion. Neck supple. No JVD present. No thyromegaly present.  Cardiovascular: Normal rate.  Exam reveals no gallop and no friction rub.   Murmur heard. JVD to the angle of the mandible with the patient in upright position  Pulmonary/Chest: She is in respiratory distress. She has wheezes. She has rales.  Increased work of breathing, speaks in 2-3 word sentences, diffuse rales, diffuse wheezing, accessory muscle usage present  Abdominal: Soft. Bowel sounds are normal. She exhibits no distension and no mass. There is no tenderness.  Musculoskeletal: Normal range of motion. She exhibits edema. She exhibits no tenderness.  Bilateral pitting edema of the lower extremities  Lymphadenopathy:    She has no cervical adenopathy.  Neurological: She is alert. Coordination normal.  Skin: Skin is warm. No rash noted. She is diaphoretic. No erythema.  Psychiatric: She has a normal mood and affect. Her behavior is normal.  Nursing note and vitals reviewed.    ED Treatments / Results  Labs (all labs ordered are listed, but only abnormal results are displayed) Labs Reviewed  CBC - Abnormal; Notable for the following:       Result Value   WBC 12.4 (*)    RDW 15.7 (*)    All other components within normal limits  BASIC METABOLIC PANEL - Abnormal; Notable for the following:    Sodium 126 (*)    Chloride 95 (*)    Glucose, Bld 337 (*)    Calcium 8.1 (*)    All other components within normal limits  BRAIN NATRIURETIC PEPTIDE - Abnormal; Notable for the following:    B Natriuretic Peptide 483.0 (*)    All other components within normal limits  PROTIME-INR - Abnormal; Notable for the following:    Prothrombin  Time 32.0 (*)    All other components within normal limits  TROPONIN I  DIGOXIN LEVEL    EKG  EKG Interpretation  Date/Time:  Sunday March 17 2016 20:20:52 EDT Ventricular Rate:  84 PR Interval:    QRS Duration: 89 QT Interval:  386 QTC Calculation: 457 R Axis:   58 Text Interpretation:  Atrial fibrillation Repol abnrm, severe global ischemia (LM/MVD) likely dig effect  since last tracing no significant change Confirmed by Sabra Heck  MD, Rhett Najera ([id number]) on 03/17/2016 9:09:14 PM       Radiology Dg Chest Portable 1 View  Result Date: 03/17/2016 CLINICAL DATA:  Acute onset of shortness of breath. Difficulty breathing. Decreased O2 saturation. Initial encounter. EXAM: PORTABLE CHEST 1 VIEW COMPARISON:  Chest radiograph performed 04/19/2014 FINDINGS: The lungs are well-aerated. Vascular congestion is noted. Small bilateral pleural effusions are seen. Increased interstitial markings raise concern for pulmonary edema. There is no evidence of pneumothorax. The cardiomediastinal silhouette is borderline normal in size. No acute osseous abnormalities are seen. IMPRESSION: Vascular congestion noted. Small bilateral pleural effusions seen. Increased interstitial markings raise concern for pulmonary edema. Electronically Signed   By: Garald Balding M.D.   On: 03/17/2016 21:18    Procedures Procedures (including critical care time)  Medications Ordered in ED Medications  furosemide (LASIX) injection 40 mg (40 mg Intravenous Given 03/17/16 2021)  potassium chloride SA (K-DUR,KLOR-CON) CR tablet 20 mEq (20 mEq Oral Given 03/17/16 2019)  nitroGLYCERIN 50 mg in dextrose 5 % 250 mL (0.2 mg/mL) infusion (5 mcg/min Intravenous New Bag/Given 03/17/16 2135)     Initial Impression / Assessment and Plan / ED Course  I have reviewed the triage vital signs and the nursing notes.  Pertinent labs & imaging results that were available during my care of the patient were reviewed by me and considered in my medical  decision making (see chart for details).  Clinical Course  Comment By Time   the patient is ill appearing, she  has clear fluid overload, there is no echocardiogram on file that I can see, no recent chest x-rays within the last 2 years and no history of BNP. We'll obtain labs, give Lasix, chest x-ray, BiPAP, when the patient is taken off of her nonrebreather she immediately drops into the 80% range and becomes more dyspneic. Noemi Chapel, MD 08/06 2011  Nitro for hypertension - severely elevated at 175/140.  Critically ill D/w nurse from her faciliyt - no echo on EMR Noemi Chapel, MD 08/06 2012  Findings consistent with CHF, improved on nitro gtt and bipap - pt is still critically ill - will need high level of care Noemi Chapel, MD 08/06 2121  Will need ICU Hospitalist in agreement Xray with effusions, pulmonary edema Noemi Chapel, MD 08/06 2146    CRITICAL CARE Performed by: Johnna Acosta Total critical care time: 35 minutes Critical care time was exclusive of separately billable procedures and treating other patients. Critical care was necessary to treat or prevent imminent or life-threatening deterioration. Critical care was time spent personally by me on the following activities: development of treatment plan with patient and/or surrogate as well as nursing, discussions with consultants, evaluation of patient's response to treatment, examination of patient, obtaining history from patient or surrogate, ordering and performing treatments and interventions, ordering and review of laboratory studies, ordering and review of radiographic studies, pulse oximetry and re-evaluation of patient's condition.  Final Clinical Impressions(s) / ED Diagnoses   Final diagnoses:  Hypertensive crisis  Acute pulmonary edema [facility])    New Prescriptions New Prescriptions   No medications on file     Noemi Chapel, MD 03/17/16 2146

## 2016-03-17 NOTE — H&P (Signed)
History and Physical  [name] [id number] DOB: Dec 27, 1920 DOA: 03/17/2016  Referring physician: ER Physician PCP: Wende Neighbors, MD  Outpatient Specialists:    Patient coming from: SNF  Chief Complaint: SOB  HPI: 82 year old female with cardiac problems, including CAD and atrial fibrillation. Patient is not a good historian. 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 Patient is not able to give significant history. Patient presented to the ER with SOB, and CXR revealed pulmonary edema. Patient is currently on BiPAP, and has received a dose of lasix at the ER with some improvement of the symptoms. First troponin is less than 0.03. Gaseous distention of the abdomen is noted. Patient will be admitted to ICU for further assessment an management.  ED Course: Diuresis and CXR.  Pertinent labs: INR is 3.02 today EKG: Independently reviewed.  Imaging: independently reviewed.   Review of Systems: As in HPI. Patient is a poor historian. Negative for fever, visual changes, sore throat, rash, new muscle aches, chest pain, dysuria, bleeding.  Past Medical History:  Diagnosis Date  . Arteriosclerotic cardiovascular disease (ASCVD)    cath in 2001- 40% LAD and 50% RCA; stent for 80% LAD in 5/04; residual 50% LAD, 80% small D1, 70% small OM1 50% ostial RCA, nl EF   . Atrial fibrillation ([place]) 2007   CHF with preserved LV systolic function - [id number]; moderate LVH  . Bronchitis    history  . Carcinoma of breast ([place])    left masectomy in 1995  . Chronic diastolic heart failure ([place]) 11/21/2009  . Chronic hoarseness   . CVD (cerebrovascular disease)    plaque w/o focal disease in 2006; h/o CVA  . Diabetes mellitus type II    no insulin   . Edema   . Herpes zoster   . Hypertension   . Hypothyroid   . Lower extremity edema 08/20/2011  . MVP (mitral valve prolapse)    moderate; with moderate MR  . Osteoporosis   . Peripheral neuropathy ([place])   . PVD (peripheral vascular disease) (HCC)    ABIs of 0.64 and 0.59, right and left leg in 2009  . 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 Shingles   . Ulcer    left lower leg  . Varicose veins of legs 08/20/2011    Past Surgical History:  Procedure Laterality Date  . ABDOMINAL HYSTERECTOMY     abd?  . CATARACT EXTRACTION, BILATERAL    . CHOLECYSTECTOMY  2006  . COLONOSCOPY  Date unknown  . CORONARY ANGIOPLASTY WITH STENT PLACEMENT    . left masectomy  1995  . ORIF of left hip  05/22/06   [name]     reports that she has never smoked. She has never used smokeless tobacco. She reports that she does not drink alcohol or use drugs.  No Known Allergies  Family History  Problem Relation Age of Onset  . Diabetes Mother      Prior to Admission medications   Medication Sig Start Date End Date Taking? Authorizing Provider  acetaminophen (TYLENOL) 325 MG tablet Take 650 mg by mouth 2 (two) times daily.   Yes Historical Provider, MD  atorvastatin (LIPITOR) 20 MG tablet Take 20 mg by mouth daily.   Yes Historical Provider, MD  carbamide peroxide (DEBROX) 6.5 % otic solution Place 2 drops into both ears every Sunday. ADMINISTERED AT BEDTIME ONCE WEEKLY-SUNDAYS   Yes Historical Provider, MD  digoxin (LANOXIN) 0.125 MG tablet Take 125 mcg by mouth daily. Takes after lunch  Yes Historical Provider, MD  diltiazem (TIAZAC) 240 MG 24 hr capsule Take 240 mg by mouth daily.   Yes Historical Provider, MD  furosemide (LASIX) 20 MG tablet Take 20 mg by mouth every morning.    Yes Historical Provider, MD  gabapentin (NEURONTIN) 100 MG capsule Take 100 mg by mouth 2 (two) times daily.    Yes Historical Provider, MD  levothyroxine (SYNTHROID, LEVOTHROID) 25 MCG tablet Take 25 mcg by mouth every evening.    Yes Historical Provider, MD  Liniments (SALONPAS EX) Apply 1 application topically daily as needed (APPLIED TO KNEE).   Yes Historical Provider, MD  nebivolol (BYSTOLIC) 10 MG tablet Take 10 mg by mouth every morning.    Yes Historical Provider, MD    ondansetron (ZOFRAN) 4 MG tablet Take 4 mg by mouth 2 (two) times daily.   Yes Historical Provider, MD  Probiotic Product (PROBIOTIC DAILY PO) Take 1 capsule by mouth daily.   Yes Historical Provider, MD  Vitamin D, Ergocalciferol, (DRISDOL) 50000 units CAPS capsule Take 50,000 Units by mouth every 30 (thirty) days.   Yes Historical Provider, MD  warfarin (COUMADIN) 2 MG tablet Take 2 mg by mouth every evening.    Yes Historical Provider, MD    Physical Exam: Vitals:   03/17/16 2027 03/17/16 2100 03/17/16 2117 03/17/16 2130  BP: (!) 171/101 (!) 169/108 (!) 169/108 (!) 165/109  Pulse: 87 83 83 63  Resp: 23 20 23 16   Temp:      TempSrc:      SpO2: 100% 100% 100% 100%  Weight:      Height:         Constitutional:  . On biPAP. Not in any distress. AAO X 3. .  . Eyes:  . No pallor. No jaundice ENMT:  . external ears, nose appear normal Neck:  . Neck is supple. JVD is equivocal Respiratory:  . CTA bilaterally, no w/r/r.  . Respiratory effort normal. No retractions or accessory muscle use Cardiovascular:  . S1S2, irregular with systolic murmur  Bilateral LE extremity edema   Abdomen:  . Abdomen is distended (Gaseous). Hypoactive bowel sounds. Organs are difficult to assess. Neurologic:  . Awake and alert. . Moves all limbs.  Wt Readings from Last 3 Encounters:  03/17/16 56.7 kg (125 lb)  02/19/16 54.9 kg (121 lb)  12/04/15 56.2 kg (124 lb)    I have personally reviewed following labs and imaging studies  Labs on Admission:  CBC:  Recent Labs Lab 03/17/16 2005  WBC 12.4*  HGB 13.8  HCT 42.1  MCV 87.2  PLT A999333   Basic Metabolic Panel:  Recent Labs Lab 03/17/16 2005  NA 126*  K 3.8  CL 95*  CO2 24  GLUCOSE 337*  BUN 14  CREATININE 0.65  CALCIUM 8.1*   Liver Function Tests: No results for input(s): AST, ALT, ALKPHOS, BILITOT, PROT, ALBUMIN in the last 168 hours. No results for input(s): LIPASE, AMYLASE in the last 168 hours. No results for  input(s): AMMONIA in the last 168 hours. Coagulation Profile:  Recent Labs Lab 03/17/16 2005  INR 3.02   Cardiac Enzymes:  Recent Labs Lab 03/17/16 2005  TROPONINI <0.03   BNP (last 3 results) No results for input(s): PROBNP in the last 8760 hours. HbA1C: No results for input(s): HGBA1C in the last 72 hours. CBG: No results for input(s): GLUCAP in the last 168 hours. Lipid Profile: No results for input(s): CHOL, HDL, LDLCALC, TRIG, CHOLHDL, LDLDIRECT in the last 72  hours. Thyroid Function Tests: No results for input(s): TSH, T4TOTAL, FREET4, T3FREE, THYROIDAB in the last 72 hours. Anemia Panel: No results for input(s): VITAMINB12, FOLATE, FERRITIN, TIBC, IRON, RETICCTPCT in the last 72 hours. Urine analysis:    Component Value Date/Time   COLORURINE YELLOW 04/19/2014 1730   APPEARANCEUR CLEAR 04/19/2014 1730   LABSPEC <1.005 (L) 04/19/2014 1730   PHURINE 6.0 04/19/2014 1730   GLUCOSEU NEGATIVE 04/19/2014 1730   HGBUR NEGATIVE 04/19/2014 1730   BILIRUBINUR NEGATIVE 04/19/2014 1730   KETONESUR NEGATIVE 04/19/2014 1730   PROTEINUR NEGATIVE 04/19/2014 1730   UROBILINOGEN 0.2 04/19/2014 1730   NITRITE NEGATIVE 04/19/2014 1730   LEUKOCYTESUR NEGATIVE 04/19/2014 1730   Sepsis Labs: @LABRCNTIP (procalcitonin:4,lacticidven:4) )No results found for this or any previous visit (from the past 240 hour(s)).    Radiological Exams on Admission: Dg Chest Portable 1 View  Result Date: 03/17/2016 CLINICAL DATA:  Acute onset of shortness of breath. Difficulty breathing. Decreased O2 saturation. Initial encounter. EXAM: PORTABLE CHEST 1 VIEW COMPARISON:  Chest radiograph performed 04/19/2014 FINDINGS: The lungs are well-aerated. Vascular congestion is noted. Small bilateral pleural effusions are seen. Increased interstitial markings raise concern for pulmonary edema. There is no evidence of pneumothorax. The cardiomediastinal silhouette is borderline normal in size. No acute osseous  abnormalities are seen. IMPRESSION: Vascular congestion noted. Small bilateral pleural effusions seen. Increased interstitial markings raise concern for pulmonary edema. Electronically Signed   By: Garald Balding M.D.   On: 03/17/2016 21:18    EKG: Independently reviewed.   Active Problems:   Acute pulmonary edema (HCC)   Acute CHF (congestive heart failure) (HCC)   Assessment/Plan 1. Acute CHF 2. Hyponatremia likely secondary to CHF 3. Acute respiratory failure likely secondary to Pulmonary edema 4. DM 5. Hyperension   Admit patient  Cycle cardiac enzymes.  Depending on the goal of care, low threshold for full work up  IV diuresis  Continue betablocker  ACEI  ECHO  Optimize BP and diabetes mellitus.  Continue BiPAP  Hyponatremia work up.  DVT prophylaxis: Anticoagulated Code Status: Full Family Communication: None  Disposition Plan: Undetermined   Consults called: Please call Cardiology in am   Admission status: Inpatient    Time spent: Greater than 60 minutes  Dana Allan, MD  [facility] Pager #: [phone] 7PM-7AM contact night coverage as above   03/17/2016, 10:10 PM

## 2016-03-18 ENCOUNTER — Inpatient Hospital Stay (HOSPITAL_COMMUNITY): Payer: Medicare Other

## 2016-03-18 ENCOUNTER — Encounter (HOSPITAL_COMMUNITY): Payer: Self-pay | Admitting: *Deleted

## 2016-03-18 DIAGNOSIS — I480 Paroxysmal atrial fibrillation: Secondary | ICD-10-CM

## 2016-03-18 DIAGNOSIS — I10 Essential (primary) hypertension: Secondary | ICD-10-CM

## 2016-03-18 DIAGNOSIS — E871 Hypo-osmolality and hyponatremia: Secondary | ICD-10-CM

## 2016-03-18 DIAGNOSIS — J9601 Acute respiratory failure with hypoxia: Secondary | ICD-10-CM

## 2016-03-18 DIAGNOSIS — I509 Heart failure, unspecified: Secondary | ICD-10-CM

## 2016-03-18 DIAGNOSIS — J810 Acute pulmonary edema: Secondary | ICD-10-CM

## 2016-03-18 LAB — OSMOLALITY, URINE: Osmolality, Ur: 193 mOsm/kg — ABNORMAL LOW (ref 300–900)

## 2016-03-18 LAB — ECHOCARDIOGRAM COMPLETE
AO mean calculated velocity dopler: 119 cm/s
AV Area VTI index: 0.97 cm2/m2
AV Area VTI: 1.4 cm2
AV Area mean vel: 1.39 cm2
AV Mean grad: 7 mmHg
AV Peak grad: 11 mmHg
AV VEL mean LVOT/AV: 0.55
AV area mean vel ind: 0.87 cm2/m2
AV peak Index: 0.88
AV pk vel: 166 cm/s
AV vel: 1.55
Ao pk vel: 0.55 m/s
E decel time: 232 msec
FS: 33 % (ref 28–44)
Height: 63 in
IVS/LV PW RATIO, ED: 1.06
LA ID, A-P, ES: 58 mm
LA diam end sys: 58 mm
LA diam index: 3.64 cm/m2
LA vol A4C: 152 ml
LA vol index: 82.2 mL/m2
LA vol: 131 mL
LV PW d: 12.5 mm — AB (ref 0.6–1.1)
LV dias vol index: 29 mL/m2
LV dias vol: 45 mL — AB (ref 46–106)
LV sys vol index: 10 mL/m2
LV sys vol: 16 mL (ref 14–42)
LVOT SV: 55 mL
LVOT VTI: 21.8 cm
LVOT area: 2.54 cm2
LVOT diameter: 18 mm
LVOT peak VTI: 0.61 cm
LVOT peak grad rest: 3 mmHg
LVOT peak vel: 91.6 cm/s
MV Dec: 232
MV Peak grad: 6 mmHg
MV pk E vel: 121 m/s
RV sys press: 47 mmHg
Reg peak vel: 330 cm/s
Simpson's disk: 66
Stroke v: 30 ml
TAPSE: 15.9 mm
TR max vel: 330 cm/s
VTI: 35.7 cm
Valve area index: 0.97
Valve area: 1.55 cm2
Weight: 2003.54 oz

## 2016-03-18 LAB — TROPONIN I
Troponin I: <0.03 ng/mL (ref <0.03)
Troponin I: <0.03 ng/mL (ref <0.03)
Troponin I: <0.03 ng/mL (ref <0.03)

## 2016-03-18 LAB — GLUCOSE, CAPILLARY
Glucose-Capillary: 307 mg/dL — ABNORMAL HIGH (ref 65–99)
Glucose-Capillary: 222 mg/dL — ABNORMAL HIGH (ref 65–99)
Glucose-Capillary: 182 mg/dL — ABNORMAL HIGH (ref 65–99)
Glucose-Capillary: 161 mg/dL — ABNORMAL HIGH (ref 65–99)
Glucose-Capillary: 230 mg/dL — ABNORMAL HIGH (ref 65–99)
Glucose-Capillary: 223 mg/dL — ABNORMAL HIGH (ref 65–99)

## 2016-03-18 LAB — BASIC METABOLIC PANEL
Anion gap: 6 (ref 5–15)
Anion gap: 9 (ref 5–15)
Anion gap: 6 (ref 5–15)
BUN: 13 mg/dL (ref 6–20)
BUN: 12 mg/dL (ref 6–20)
BUN: 13 mg/dL (ref 6–20)
CO2: 28 mmol/L (ref 22–32)
CO2: 28 mmol/L (ref 22–32)
CO2: 28 mmol/L (ref 22–32)
Calcium: 7.9 mg/dL — ABNORMAL LOW (ref 8.9–10.3)
Calcium: 8 mg/dL — ABNORMAL LOW (ref 8.9–10.3)
Calcium: 7.8 mg/dL — ABNORMAL LOW (ref 8.9–10.3)
Chloride: 98 mmol/L — ABNORMAL LOW (ref 101–111)
Chloride: 99 mmol/L — ABNORMAL LOW (ref 101–111)
Chloride: 100 mmol/L — ABNORMAL LOW (ref 101–111)
Creatinine, Ser: 0.56 mg/dL (ref 0.44–1.00)
Creatinine, Ser: 0.68 mg/dL (ref 0.44–1.00)
Creatinine, Ser: 0.67 mg/dL (ref 0.44–1.00)
GFR calc Af Amer: >60 mL/min (ref >60)
GFR calc Af Amer: >60 mL/min (ref >60)
GFR calc Af Amer: >60 mL/min (ref >60)
GFR calc non Af Amer: >60 mL/min (ref >60)
GFR calc non Af Amer: >60 mL/min (ref >60)
GFR calc non Af Amer: >60 mL/min (ref >60)
Glucose, Bld: 301 mg/dL — ABNORMAL HIGH (ref 65–99)
Glucose, Bld: 246 mg/dL — ABNORMAL HIGH (ref 65–99)
Glucose, Bld: 203 mg/dL — ABNORMAL HIGH (ref 65–99)
Potassium: 3.9 mmol/L (ref 3.5–5.1)
Potassium: 3.7 mmol/L (ref 3.5–5.1)
Potassium: 3.5 mmol/L (ref 3.5–5.1)
Sodium: 132 mmol/L — ABNORMAL LOW (ref 135–145)
Sodium: 136 mmol/L (ref 135–145)
Sodium: 134 mmol/L — ABNORMAL LOW (ref 135–145)

## 2016-03-18 LAB — OSMOLALITY: Osmolality: 293 mOsm/kg (ref 275–295)

## 2016-03-18 LAB — MRSA PCR SCREENING: MRSA by PCR: NEGATIVE

## 2016-03-18 LAB — DIGOXIN LEVEL: Digoxin Level: 1.1 ng/mL (ref 0.8–2.0)

## 2016-03-18 LAB — MAGNESIUM: Magnesium: 1.7 mg/dL (ref 1.7–2.4)

## 2016-03-18 LAB — SODIUM, URINE, RANDOM: Sodium, Ur: 24 mmol/L

## 2016-03-18 LAB — PROTIME-INR
INR: 3.17
Prothrombin Time: 33.2 seconds — ABNORMAL HIGH (ref 11.4–15.2)

## 2016-03-18 MED ORDER — ONDANSETRON HCL 4 MG/2ML IJ SOLN: 4.0000 mg | Freq: Three times a day (TID) | INTRAMUSCULAR | Status: AC | PRN

## 2016-03-18 MED ORDER — NEBIVOLOL HCL 10 MG PO TABS
10.0000 mg | ORAL_TABLET | Freq: Every morning | ORAL | Status: DC
  Administered 2016-03-18 – 2016-03-21 (×4): 10 mg via ORAL
  Filled 2016-03-18 (×5): qty 1

## 2016-03-18 MED ORDER — GABAPENTIN 100 MG PO CAPS
100.0000 mg | ORAL_CAPSULE | Freq: Two times a day (BID) | ORAL | Status: DC
  Administered 2016-03-18 – 2016-03-21 (×8): 100 mg via ORAL
  Filled 2016-03-18 (×8): qty 1

## 2016-03-18 MED ORDER — CARBAMIDE PEROXIDE 6.5 % OT SOLN
2.0000 [drp] | OTIC | Status: DC
  Filled 2016-03-18: qty 15

## 2016-03-18 MED ORDER — RISAQUAD PO CAPS
1.0000 | ORAL_CAPSULE | Freq: Every day | ORAL | Status: DC
  Administered 2016-03-18 – 2016-03-21 (×4): 1 via ORAL
  Filled 2016-03-18 (×6): qty 1

## 2016-03-18 MED ORDER — LEVOTHYROXINE SODIUM 25 MCG PO TABS
25.0000 ug | ORAL_TABLET | Freq: Every evening | ORAL | Status: DC
  Administered 2016-03-18 – 2016-03-20 (×3): 25 ug via ORAL
  Filled 2016-03-18 (×3): qty 1

## 2016-03-18 MED ORDER — INSULIN ASPART 100 UNIT/ML ~~LOC~~ SOLN
0.0000 [IU] | Freq: Three times a day (TID) | SUBCUTANEOUS | Status: DC
  Administered 2016-03-18 (×2): 2 [IU] via SUBCUTANEOUS
  Administered 2016-03-18: 3 [IU] via SUBCUTANEOUS
  Administered 2016-03-19 (×2): 2 [IU] via SUBCUTANEOUS
  Administered 2016-03-19: 5 [IU] via SUBCUTANEOUS
  Administered 2016-03-20: 3 [IU] via SUBCUTANEOUS
  Administered 2016-03-20: 1 [IU] via SUBCUTANEOUS
  Administered 2016-03-20: 9 [IU] via SUBCUTANEOUS
  Administered 2016-03-21: 7 [IU] via SUBCUTANEOUS
  Administered 2016-03-21: 2 [IU] via SUBCUTANEOUS

## 2016-03-18 MED ORDER — LISINOPRIL 10 MG PO TABS
10.0000 mg | ORAL_TABLET | Freq: Every day | ORAL | Status: DC
  Administered 2016-03-18 – 2016-03-19 (×2): 10 mg via ORAL
  Filled 2016-03-18 (×2): qty 1

## 2016-03-18 MED ORDER — POTASSIUM CHLORIDE CRYS ER 20 MEQ PO TBCR
40.0000 meq | EXTENDED_RELEASE_TABLET | Freq: Every day | ORAL | Status: DC
  Administered 2016-03-18 – 2016-03-19 (×2): 40 meq via ORAL
  Filled 2016-03-18 (×2): qty 2

## 2016-03-18 MED ORDER — FUROSEMIDE 10 MG/ML IJ SOLN
40.0000 mg | Freq: Two times a day (BID) | INTRAMUSCULAR | Status: DC
  Administered 2016-03-18 – 2016-03-19 (×3): 40 mg via INTRAVENOUS
  Filled 2016-03-18 (×3): qty 4

## 2016-03-18 MED ORDER — SODIUM CHLORIDE 0.9% FLUSH: 3.0000 mL | INTRAVENOUS | Status: DC | PRN

## 2016-03-18 MED ORDER — DIGOXIN 125 MCG PO TABS
125.0000 ug | ORAL_TABLET | Freq: Every day | ORAL | Status: DC
  Administered 2016-03-18 – 2016-03-21 (×4): 125 ug via ORAL
  Filled 2016-03-18 (×4): qty 1

## 2016-03-18 MED ORDER — INSULIN ASPART 100 UNIT/ML ~~LOC~~ SOLN
0.0000 [IU] | Freq: Every day | SUBCUTANEOUS | Status: DC
Start: 2016-03-18 — End: 2016-03-21
  Administered 2016-03-18: 4 [IU] via SUBCUTANEOUS
  Administered 2016-03-18: 2 [IU] via SUBCUTANEOUS
  Administered 2016-03-20: 3 [IU] via SUBCUTANEOUS

## 2016-03-18 MED ORDER — ATORVASTATIN CALCIUM 20 MG PO TABS
20.0000 mg | ORAL_TABLET | Freq: Every day | ORAL | Status: DC
  Administered 2016-03-18 – 2016-03-21 (×4): 20 mg via ORAL
  Filled 2016-03-18 (×4): qty 1

## 2016-03-18 MED ORDER — SODIUM CHLORIDE 0.9 % IV SOLN: 250.0000 mL | INTRAVENOUS | Status: DC | PRN

## 2016-03-18 MED ORDER — ONDANSETRON HCL 4 MG/2ML IJ SOLN: 4.0000 mg | Freq: Four times a day (QID) | INTRAMUSCULAR | Status: DC | PRN

## 2016-03-18 MED ORDER — NITROGLYCERIN IN D5W 200-5 MCG/ML-% IV SOLN
5.0000 ug/min | INTRAVENOUS | Status: DC
  Administered 2016-03-18: 20 ug/min via INTRAVENOUS
  Administered 2016-03-19: 33.33 ug/min via INTRAVENOUS
  Filled 2016-03-18: qty 250

## 2016-03-18 MED ORDER — CETYLPYRIDINIUM CHLORIDE 0.05 % MT LIQD
7.0000 mL | Freq: Two times a day (BID) | OROMUCOSAL | Status: DC
  Administered 2016-03-18 – 2016-03-21 (×8): 7 mL via OROMUCOSAL

## 2016-03-18 MED ORDER — FLORA-Q PO CAPS
ORAL_CAPSULE | Freq: Every day | ORAL | Status: DC
  Filled 2016-03-18: qty 1

## 2016-03-18 MED ORDER — DILTIAZEM HCL ER COATED BEADS 240 MG PO CP24
240.0000 mg | ORAL_CAPSULE | Freq: Every day | ORAL | Status: DC
  Administered 2016-03-18 – 2016-03-21 (×4): 240 mg via ORAL
  Filled 2016-03-18 (×5): qty 1

## 2016-03-18 MED ORDER — ACETAMINOPHEN 325 MG PO TABS
650.0000 mg | ORAL_TABLET | ORAL | Status: DC | PRN
  Administered 2016-03-19: 650 mg via ORAL
  Filled 2016-03-18: qty 2

## 2016-03-18 MED ORDER — CHLORHEXIDINE GLUCONATE 0.12 % MT SOLN
15.0000 mL | Freq: Two times a day (BID) | OROMUCOSAL | Status: DC
  Administered 2016-03-18 – 2016-03-21 (×7): 15 mL via OROMUCOSAL
  Filled 2016-03-18 (×6): qty 15

## 2016-03-18 MED ORDER — SODIUM CHLORIDE 0.9% FLUSH
3.0000 mL | Freq: Two times a day (BID) | INTRAVENOUS | Status: DC
  Administered 2016-03-18 – 2016-03-21 (×7): 3 mL via INTRAVENOUS

## 2016-03-18 NOTE — Progress Notes (Signed)
PROGRESS NOTE  [name]  [id number] DOB: Jan 07, 1921 DOA: 03/17/2016 PCP: Wende Neighbors, MD Outpatient Specialists:  Subjective: Seen with her son at bedside, blood pressure is elevated, off of BiPAP.  Brief Narrative:  82 year old female with cardiac problems, including CAD and atrial fibrillation. Patient is not a good historian. 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 Patient is not able to give significant history. Patient presented to the ER with SOB, and CXR revealed pulmonary edema. Patient is currently on BiPAP, and has received a dose of lasix at the ER with some improvement of the symptoms. First troponin is less than 0.03. Gaseous distention of the abdomen is noted. Patient will be admitted to ICU for further assessment an management.  Assessment & Plan:   Principal Problem:   Acute CHF (congestive heart failure) (HCC) Active Problems:   Hypertension   Atrial fibrillation (HCC)   Acute pulmonary edema (HCC)   Acute respiratory failure with hypoxia (HCC)   Hyponatremia   Acute CHF -Presented with lower extremity edema, elevated BNP and CXR findings of pulmonary edema. -Started on nitroglycerin drip, and aggressive diuresis with IV Lasix. -On beta blockers, lisinopril, digoxin and diuresis. -Following take/output, renal function and daily weight. -Restrict sodium and fluid intake. Check 2-D echo  Acute pulmonary edema -This is likely cardiogenic pulmonary edema secondary to acute CHF. -Follow clinically as patient started on diuresis.  Acute respiratory failure with hypoxia -Patient with hypoxic to the point she required noninvasive ventilation with BiPAP. -She spent the night on the BiPAP, currently weaned off to nasal cannula.  Atrial fibrillation -Chronic atrial fibrillation, her CHA2DS2-VASc score is greater than 3. -Rate is controlled with beta blockers, digoxin and Cardizem. She is anticoagulated with warfarin.  Essential hypertension -Blood pressure  is elevated secondary to the CHF, currently on nitroglycerin drip, home medications restarted.  Diabetes mellitus type 2 -On insulin sliding scale.   DVT prophylaxis:  Code Status: Full Code Family Communication:  Disposition Plan:  Diet: Diet Heart Room service appropriate? Yes; Fluid consistency: Thin  Consultants:   None  Procedures:   None  Antimicrobials:   None   Objective: Vitals:   03/18/16 0839 03/18/16 0957 03/18/16 1134 03/18/16 1156  BP:  (!) 169/100    Pulse: 75  (!) 102   Resp: (!) 24     Temp:    97.5 F (36.4 C)  TempSrc:    Oral  SpO2: 98%     Weight:      Height:        Intake/Output Summary (Last 24 hours) at 03/18/16 1309 Last data filed at 03/18/16 1156  Gross per 24 hour  Intake            255.8 ml  Output             1025 ml  Net           -769.2 ml   Filed Weights   03/17/16 2005 03/18/16 0015  Weight: 56.7 kg (125 lb) 56.8 kg (125 lb 3.5 oz)    Examination: General exam: Appears calm and comfortable  Respiratory system: Clear to auscultation. Respiratory effort normal. Cardiovascular system: S1 & S2 heard, RRR. No JVD, murmurs, rubs, gallops or clicks. No pedal edema. Gastrointestinal system: Abdomen is nondistended, soft and nontender. No organomegaly or masses felt. Normal bowel sounds heard. Central nervous system: Alert and oriented. No focal neurological deficits. Extremities: Symmetric 5 x 5 power. Skin: No rashes, lesions or ulcers Psychiatry: Judgement  and insight appear normal. Mood & affect appropriate.   Data Reviewed: I have personally reviewed following labs and imaging studies  CBC:  Recent Labs Lab 03/17/16 2005  WBC 12.4*  HGB 13.8  HCT 42.1  MCV 87.2  PLT [id number]   Basic Metabolic Panel:  Recent Labs Lab 03/17/16 2005 03/18/16 0054 03/18/16 0534 03/18/16 0842  NA 126* 132* 136 134*  K 3.8 3.9 3.7 3.5  CL 95* 98* 99* 100*  CO2 24 28 28 28   GLUCOSE 337* 301* 246* 203*  BUN 14 13 12 13     CREATININE 0.65 0.56 0.68 0.67  CALCIUM 8.1* 7.9* 8.0* 7.8*  MG  --  1.7  --   --    GFR: Estimated Creatinine Clearance: 36.3 mL/min (by C-G formula based on SCr of 0.8 mg/dL). Liver Function Tests: No results for input(s): AST, ALT, ALKPHOS, BILITOT, PROT, ALBUMIN in the last 168 hours. No results for input(s): LIPASE, AMYLASE in the last 168 hours. No results for input(s): AMMONIA in the last 168 hours. Coagulation Profile:  Recent Labs Lab 03/17/16 2005 03/18/16 0054  INR 3.02 3.17   Cardiac Enzymes:  Recent Labs Lab 03/17/16 2005 03/18/16 0054 03/18/16 0534  TROPONINI <0.03 <0.03 <0.03   BNP (last 3 results) No results for input(s): PROBNP in the last 8760 hours. HbA1C: No results for input(s): HGBA1C in the last 72 hours. CBG:  Recent Labs Lab 03/18/16 0122 03/18/16 0730 03/18/16 1154  GLUCAP 307* 222* 182*   Lipid Profile: No results for input(s): CHOL, HDL, LDLCALC, TRIG, CHOLHDL, LDLDIRECT in the last 72 hours. Thyroid Function Tests: No results for input(s): TSH, T4TOTAL, FREET4, T3FREE, THYROIDAB in the last 72 hours. Anemia Panel: No results for input(s): VITAMINB12, FOLATE, FERRITIN, TIBC, IRON, RETICCTPCT in the last 72 hours. Urine analysis:    Component Value Date/Time   COLORURINE YELLOW 04/19/2014 1730   APPEARANCEUR CLEAR 04/19/2014 1730   LABSPEC <1.005 (L) 04/19/2014 1730   PHURINE 6.0 04/19/2014 1730   GLUCOSEU NEGATIVE 04/19/2014 1730   HGBUR NEGATIVE 04/19/2014 1730   BILIRUBINUR NEGATIVE 04/19/2014 1730   KETONESUR NEGATIVE 04/19/2014 1730   PROTEINUR NEGATIVE 04/19/2014 1730   UROBILINOGEN 0.2 04/19/2014 1730   NITRITE NEGATIVE 04/19/2014 1730   LEUKOCYTESUR NEGATIVE 04/19/2014 1730   Sepsis Labs: @LABRCNTIP (procalcitonin:4,lacticidven:4)  ) Recent Results (from the past 240 hour(s))  MRSA PCR Screening     Status: None   Collection Time: 03/18/16  1:10 AM  Result Value Ref Range Status   MRSA by PCR NEGATIVE NEGATIVE  Final    Comment:        The GeneXpert MRSA Assay (FDA approved for NASAL specimens only), is one component of a comprehensive MRSA colonization surveillance program. It is not intended to diagnose MRSA infection nor to guide or monitor treatment for MRSA infections.      Invalid input(s): PROCALCITONIN, LACTICACIDVEN   Radiology Studies: Dg Abd 1 View  Result Date: 03/18/2016 CLINICAL DATA:  Abdominal distension EXAM: ABDOMEN - 1 VIEW COMPARISON:  04/12/2014 FINDINGS: Scattered large and small bowel gas is noted. No obstructive changes are seen. No free air is noted. Postsurgical changes in the left hip are again seen. Degenerative change of the lumbar spine is again noted. IMPRESSION: No acute abnormality noted. Electronically Signed   By: Inez Catalina M.D.   On: 03/18/2016 09:01   Dg Chest Portable 1 View  Result Date: 03/17/2016 CLINICAL DATA:  Acute onset of shortness of breath. Difficulty breathing. Decreased O2 saturation. Initial  encounter. EXAM: PORTABLE CHEST 1 VIEW COMPARISON:  Chest radiograph performed 04/19/2014 FINDINGS: The lungs are well-aerated. Vascular congestion is noted. Small bilateral pleural effusions are seen. Increased interstitial markings raise concern for pulmonary edema. There is no evidence of pneumothorax. The cardiomediastinal silhouette is borderline normal in size. No acute osseous abnormalities are seen. IMPRESSION: Vascular congestion noted. Small bilateral pleural effusions seen. Increased interstitial markings raise concern for pulmonary edema. Electronically Signed   By: Garald Balding M.D.   On: 03/17/2016 21:18        Scheduled Meds: . acidophilus  1 capsule Oral Daily  . antiseptic oral rinse  7 mL Mouth Rinse q12n4p  . atorvastatin  20 mg Oral Daily  . carbamide peroxide  2 drop Both Ears Q Sun  . chlorhexidine  15 mL Mouth Rinse BID  . digoxin  125 mcg Oral Daily  . diltiazem  240 mg Oral Daily  . furosemide  40 mg Intravenous Q12H    . gabapentin  100 mg Oral BID  . insulin aspart  0-5 Units Subcutaneous QHS  . insulin aspart  0-9 Units Subcutaneous TID WC  . levothyroxine  25 mcg Oral QPM  . lisinopril  10 mg Oral Daily  . nebivolol  10 mg Oral q morning - 10a  . sodium chloride flush  3 mL Intravenous Q12H   Continuous Infusions: . nitroGLYCERIN 40 mcg/min (03/18/16 1017)     LOS: 1 day    Time spent: 35 minutes    Orest Dygert A, MD [facility] Pager [phone]  If 7PM-7AM, please contact night-coverage [email or web address] Password [email or web address] 03/18/2016, 1:09 PM

## 2016-03-18 NOTE — Care Management Note (Signed)
Case Management Note  Patient Details  Name: [name] MRN: [id number] Date of Birth: Nov 23, 1920  Subjective/Objective:                  Admitted with pulmonary edema. Pt is from [facility]. Anticipate pt will return to [facility] at [place] is aware of placement needs and will make arrangements for return to facility at [place].   Action/Plan: No CM needs anticipated.   Expected Discharge Date:     03/23/2016             Expected Discharge Plan:  Skilled Nursing Facility  In-House Referral:  Clinical Social Work  Discharge planning Services  CM Consult  Post Acute Care Choice:  NA Choice offered to:  NA  DME Arranged:    DME Agency:     HH Arranged:    [place] Agency:     Status of Service:  Completed, signed off  If discussed at [employer], dates discussed:    Additional Comments:  Sherald Barge, RN 03/18/2016, 3:02 PM

## 2016-03-18 NOTE — Progress Notes (Signed)
*  PRELIMINARY RESULTS* Echocardiogram 2D Echocardiogram has been performed.  [name] 03/18/2016, 4:52 PM

## 2016-03-18 NOTE — Progress Notes (Signed)
Inpatient Diabetes Program Recommendations  AACE/ADA: New Consensus Statement on Inpatient Glycemic Control (2015)  Target Ranges:  Prepandial:   less than 140 mg/dL      Peak postprandial:   less than 180 mg/dL (1-2 hours)      Critically ill patients:  140 - 180 mg/dL    Review of Glycemic Control  Diabetes history: DM2 Outpatient Diabetes medications: None listed on home medication list; however on office note by Dr. Harrington Challenger on 02/19/16 patient had Glipizide 5 mg QAM and Metformin 1000 mg (no frequency) listed Current orders for Inpatient glycemic control: Novolog 0-9 units TID with meals, Novolog 0-5 units QHS  Inpatient Diabetes Program Recommendations: Insulin - Basal: Please consider ordering low dose basal insulin. Recommend ordering Lantus 6 units Q24H (based on 56 kg x 0.1 units).  Thanks, Barnie Alderman, RN, MSN, CDE Diabetes Coordinator Inpatient Diabetes Program [phone] (Team Pager from [place] to [place]) [phone] ([facility] office) [phone] [facility] office) [phone] [facility] office)

## 2016-03-18 NOTE — Progress Notes (Signed)
ANTICOAGULATION CONSULT NOTE - Initial Consult  Pharmacy Consult for coumadin Indication: atrial fibrillation  No Known Allergies  Patient Measurements: Height: 5\' 3"  (160 cm) Weight: 125 lb 3.5 oz (56.8 kg) IBW/kg (Calculated) : 52.4   Vital Signs: Temp: 97.5 F (36.4 C) (08/07 0808) Temp Source: Axillary (08/07 0808) BP: 180/78 (08/07 0745) Pulse Rate: 75 (08/07 0839)  Labs:  Recent Labs  03/17/16 2005 03/18/16 0054 03/18/16 0534  HGB 13.8  --   --   HCT 42.1  --   --   PLT 287  --   --   LABPROT 32.0* 33.2*  --   INR 3.02 3.17  --   CREATININE 0.65 0.56 0.68  TROPONINI <0.03 <0.03 <0.03    Estimated Creatinine Clearance: 36.3 mL/min (by C-G formula based on SCr of 0.8 mg/dL).   Medical History: Past Medical History:  Diagnosis Date  . Arteriosclerotic cardiovascular disease (ASCVD)    cath in 2001- 40% LAD and 50% RCA; stent for 80% LAD in 5/04; residual 50% LAD, 80% small D1, 70% small OM1 50% ostial RCA, nl EF   . Atrial fibrillation ([place]) 2007   CHF with preserved LV systolic function - [id number]; moderate LVH  . Bronchitis    history  . Carcinoma of breast ([place])    left masectomy in 1995  . Chronic diastolic heart failure ([place]) 11/21/2009  . Chronic hoarseness   . CVD (cerebrovascular disease)    plaque w/o focal disease in 2006; h/o CVA  . Diabetes mellitus type II    no insulin   . Edema   . Herpes zoster   . Hypertension   . Hypothyroid   . Lower extremity edema 08/20/2011  . MVP (mitral valve prolapse)    moderate; with moderate MR  . Osteoporosis   . Peripheral neuropathy ([place])   . PVD (peripheral vascular disease) (HCC)    ABIs of 0.64 and 0.59, right and left leg in 2009  . 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 Shingles   . Ulcer    left lower leg  . Varicose veins of legs 08/20/2011    Medications:  Prescriptions Prior to Admission  Medication Sig Dispense Refill Last Dose  . acetaminophen (TYLENOL) 325 MG tablet Take 650 mg by mouth 2 (two)  times daily.   03/17/2016 at 1400  . atorvastatin (LIPITOR) 20 MG tablet Take 20 mg by mouth daily.   03/17/2016 at 1700  . carbamide peroxide (DEBROX) 6.5 % otic solution Place 2 drops into both ears every Sunday. ADMINISTERED AT BEDTIME ONCE WEEKLY-SUNDAYS   Past Month at Unknown time  . digoxin (LANOXIN) 0.125 MG tablet Take 125 mcg by mouth daily. Takes after lunch   03/17/2016 at 1000  . diltiazem (TIAZAC) 240 MG 24 hr capsule Take 240 mg by mouth daily.   03/17/2016 at 1000  . furosemide (LASIX) 20 MG tablet Take 20 mg by mouth every morning.    03/17/2016 at 1000  . gabapentin (NEURONTIN) 100 MG capsule Take 100 mg by mouth 2 (two) times daily.    03/17/2016 at 1000  . levothyroxine (SYNTHROID, LEVOTHROID) 25 MCG tablet Take 25 mcg by mouth every evening.    03/16/2016 at 2100  . Liniments (SALONPAS EX) Apply 1 application topically daily as needed (APPLIED TO KNEE).   UNKNOWN  . nebivolol (BYSTOLIC) 10 MG tablet Take 10 mg by mouth every morning.    03/17/2016 at Unknown time  . ondansetron (ZOFRAN) 4 MG tablet Take 4 mg  by mouth 2 (two) times daily.   03/17/2016 at 1800  . Probiotic Product (PROBIOTIC DAILY PO) Take 1 capsule by mouth daily.   03/17/2016 at [place]  . Vitamin D, Ergocalciferol, (DRISDOL) 50000 units CAPS capsule Take 50,000 Units by mouth every 30 (thirty) days.   03/13/2016 at 1000  . warfarin (COUMADIN) 2 MG tablet Take 2 mg by mouth every evening.    03/17/2016 at 1700    Assessment: 82 yo lady admitted with SOB to continue coumadin for afib.  INR today 3.17. Goal of Therapy:  INR 2-3 Monitor platelets by anticoagulation protocol: Yes   Plan:  Coumadin 1 mg po today. Daily PT/INR Monitor for bleeding complications  Berklie Dethlefs [place] 03/18/2016,9:04 AM

## 2016-03-18 NOTE — Care Management Important Message (Signed)
Important Message  Patient Details  Name: [name] MRN: [id number] Date of Birth: [birth year]   Medicare Important Message Given:  Yes    Sherald Barge, RN 03/18/2016, 3:01 PM

## 2016-03-19 LAB — GLUCOSE, CAPILLARY
Glucose-Capillary: 196 mg/dL — ABNORMAL HIGH (ref 65–99)
Glucose-Capillary: 284 mg/dL — ABNORMAL HIGH (ref 65–99)
Glucose-Capillary: 166 mg/dL — ABNORMAL HIGH (ref 65–99)
Glucose-Capillary: 191 mg/dL — ABNORMAL HIGH (ref 65–99)

## 2016-03-19 LAB — BASIC METABOLIC PANEL
Anion gap: 9 (ref 5–15)
BUN: 14 mg/dL (ref 6–20)
CO2: 28 mmol/L (ref 22–32)
Calcium: 8.2 mg/dL — ABNORMAL LOW (ref 8.9–10.3)
Chloride: 101 mmol/L (ref 101–111)
Creatinine, Ser: 0.61 mg/dL (ref 0.44–1.00)
GFR calc Af Amer: >60 mL/min (ref >60)
GFR calc non Af Amer: >60 mL/min (ref >60)
Glucose, Bld: 214 mg/dL — ABNORMAL HIGH (ref 65–99)
Potassium: 3.6 mmol/L (ref 3.5–5.1)
Sodium: 138 mmol/L (ref 135–145)

## 2016-03-19 LAB — PROTIME-INR
INR: 2.1
Prothrombin Time: 23.9 seconds — ABNORMAL HIGH (ref 11.4–15.2)

## 2016-03-19 LAB — HEMOGLOBIN A1C
Hgb A1c MFr Bld: 8.2 % — ABNORMAL HIGH (ref 4.8–5.6)
Mean Plasma Glucose: 189 mg/dL

## 2016-03-19 MED ORDER — LISINOPRIL 10 MG PO TABS
20.0000 mg | ORAL_TABLET | Freq: Every day | ORAL | Status: DC
  Administered 2016-03-20 – 2016-03-21 (×2): 20 mg via ORAL
  Filled 2016-03-19 (×2): qty 2

## 2016-03-19 MED ORDER — POTASSIUM CHLORIDE CRYS ER 20 MEQ PO TBCR
40.0000 meq | EXTENDED_RELEASE_TABLET | Freq: Every day | ORAL | Status: DC
Start: 2016-03-19 — End: 2016-03-21
  Administered 2016-03-19 – 2016-03-20 (×2): 40 meq via ORAL
  Filled 2016-03-19 (×2): qty 2

## 2016-03-19 MED ORDER — WARFARIN - PHARMACIST DOSING INPATIENT
Freq: Every day | Status: DC
  Administered 2016-03-19: 18:00:00

## 2016-03-19 MED ORDER — METOLAZONE 5 MG PO TABS
5.0000 mg | ORAL_TABLET | Freq: Once | ORAL | Status: AC
  Administered 2016-03-19: 5 mg via ORAL
  Filled 2016-03-19: qty 1

## 2016-03-19 MED ORDER — ISOSORBIDE MONONITRATE ER 60 MG PO TB24
30.0000 mg | ORAL_TABLET | Freq: Every day | ORAL | Status: DC
  Administered 2016-03-19 – 2016-03-21 (×3): 30 mg via ORAL
  Filled 2016-03-19 (×3): qty 1

## 2016-03-19 MED ORDER — WARFARIN SODIUM 2 MG PO TABS
2.0000 mg | ORAL_TABLET | Freq: Once | ORAL | Status: AC
  Administered 2016-03-19: 2 mg via ORAL
  Filled 2016-03-19: qty 1

## 2016-03-19 MED ORDER — FUROSEMIDE 10 MG/ML IJ SOLN
60.0000 mg | Freq: Two times a day (BID) | INTRAMUSCULAR | Status: DC
  Administered 2016-03-19 – 2016-03-20 (×4): 60 mg via INTRAVENOUS
  Filled 2016-03-19 (×4): qty 6

## 2016-03-19 NOTE — Progress Notes (Signed)
PROGRESS NOTE  [name]  [id number] DOB: 1920-12-09 DOA: 03/17/2016 PCP: Wende Neighbors, MD Outpatient Specialists:  Subjective: Feels better, denies shortness of breath this morning.  Brief Narrative:  82 year old female with cardiac problems, including CAD and atrial fibrillation. Patient is not a good historian. 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 Patient is not able to give significant history. Patient presented to the ER with SOB, and CXR revealed pulmonary edema. Patient is currently on BiPAP, and has received a dose of lasix at the ER with some improvement of the symptoms. First troponin is less than 0.03. Gaseous distention of the abdomen is noted. Patient will be admitted to ICU for further assessment an management.  Assessment & Plan:   Principal Problem:   Acute CHF (congestive heart failure) (HCC) Active Problems:   Hypertension   Atrial fibrillation (HCC)   Acute pulmonary edema (HCC)   Acute respiratory failure with hypoxia (HCC)   Hyponatremia   Acute diastolic CHF -Presented with lower extremity edema, elevated BNP and CXR findings of pulmonary edema. -Started on nitroglycerin drip, and aggressive diuresis with IV Lasix. -On beta blockers, lisinopril, digoxin and diuresis. -Following take/output, renal function and daily weight. -Restrict sodium and fluid intake. 2-D echo showed ejection fraction of 60-65% -Weaned off of the nitroglycerin drip this morning, blood pressure labile. -I will increase lisinopril and increased her Lasix from 40 twice a day to 60.  Acute pulmonary edema -This is likely cardiogenic pulmonary edema secondary to acute CHF. -Follow clinically as patient started on diuresis.  Acute respiratory failure with hypoxia -Patient with hypoxic to the point she required noninvasive ventilation with BiPAP. -She spent the night on the BiPAP, currently weaned off to nasal cannula.  Atrial fibrillation -Chronic atrial fibrillation, her  CHA2DS2-VASc score is greater than 3. -Rate is controlled with beta blockers, digoxin and Cardizem. She is anticoagulated with warfarin.  Essential hypertension -Blood pressure is elevated secondary to the CHF, currently on nitroglycerin drip, home medications restarted.  Diabetes mellitus type 2 -On insulin sliding scale.   DVT prophylaxis:  Code Status: Full Code Family Communication:  Disposition Plan:  Diet: Diet Heart Room service appropriate? Yes; Fluid consistency: Thin  Consultants:   None  Procedures:   None  Antimicrobials:   None   Objective: Vitals:   03/19/16 0100 03/19/16 0400 03/19/16 0500 03/19/16 0812  BP: (!) 142/62     Pulse: (!) 144     Resp: (!) 23     Temp:  97.6 F (36.4 C)  98.3 F (36.8 C)  TempSrc:  Oral  Oral  SpO2: (!) 89%     Weight:   54.8 kg (120 lb 13 oz)   Height:        Intake/Output Summary (Last 24 hours) at 03/19/16 1218 Last data filed at 03/19/16 0950  Gross per 24 hour  Intake           523.07 ml  Output             2650 ml  Net         -2126.93 ml   Filed Weights   03/17/16 2005 03/18/16 0015 03/19/16 0500  Weight: 56.7 kg (125 lb) 56.8 kg (125 lb 3.5 oz) 54.8 kg (120 lb 13 oz)    Examination: General exam: Appears calm and comfortable  Respiratory system: Clear to auscultation. Respiratory effort normal. Cardiovascular system: S1 & S2 heard, RRR. No JVD, murmurs, rubs, gallops or clicks. No pedal edema. Gastrointestinal  system: Abdomen is nondistended, soft and nontender. No organomegaly or masses felt. Normal bowel sounds heard. Central nervous system: Alert and oriented. No focal neurological deficits. Extremities: Symmetric 5 x 5 power. Skin: No rashes, lesions or ulcers Psychiatry: Judgement and insight appear normal. Mood & affect appropriate.   Data Reviewed: I have personally reviewed following labs and imaging studies  CBC:  Recent Labs Lab 03/17/16 2005  WBC 12.4*  HGB 13.8  HCT 42.1  MCV  87.2  PLT [id number]   Basic Metabolic Panel:  Recent Labs Lab 03/17/16 2005 03/18/16 0054 03/18/16 0534 03/18/16 0842 03/19/16 0446  NA 126* 132* 136 134* 138  K 3.8 3.9 3.7 3.5 3.6  CL 95* 98* 99* 100* 101  CO2 24 28 28 28 28   GLUCOSE 337* 301* 246* 203* 214*  BUN 14 13 12 13 14   CREATININE 0.65 0.56 0.68 0.67 0.61  CALCIUM 8.1* 7.9* 8.0* 7.8* 8.2*  MG  --  1.7  --   --   --    GFR: Estimated Creatinine Clearance: 36.3 mL/min (by C-G formula based on SCr of 0.8 mg/dL). Liver Function Tests: No results for input(s): AST, ALT, ALKPHOS, BILITOT, PROT, ALBUMIN in the last 168 hours. No results for input(s): LIPASE, AMYLASE in the last 168 hours. No results for input(s): AMMONIA in the last 168 hours. Coagulation Profile:  Recent Labs Lab 03/17/16 2005 03/18/16 0054  INR 3.02 3.17   Cardiac Enzymes:  Recent Labs Lab 03/17/16 2005 03/18/16 0054 03/18/16 0534 03/18/16 1251  TROPONINI <0.03 <0.03 <0.03 <0.03   BNP (last 3 results) No results for input(s): PROBNP in the last 8760 hours. HbA1C:  Recent Labs  03/18/16 0054  HGBA1C 8.2*   CBG:  Recent Labs Lab 03/18/16 1709 03/18/16 1930 03/18/16 2129 03/19/16 0722 03/19/16 1150  GLUCAP 161* 230* 223* 196* 284*   Lipid Profile: No results for input(s): CHOL, HDL, LDLCALC, TRIG, CHOLHDL, LDLDIRECT in the last 72 hours. Thyroid Function Tests: No results for input(s): TSH, T4TOTAL, FREET4, T3FREE, THYROIDAB in the last 72 hours. Anemia Panel: No results for input(s): VITAMINB12, FOLATE, FERRITIN, TIBC, IRON, RETICCTPCT in the last 72 hours. Urine analysis:    Component Value Date/Time   COLORURINE YELLOW 04/19/2014 1730   APPEARANCEUR CLEAR 04/19/2014 1730   LABSPEC <1.005 (L) 04/19/2014 1730   PHURINE 6.0 04/19/2014 1730   GLUCOSEU NEGATIVE 04/19/2014 1730   HGBUR NEGATIVE 04/19/2014 1730   BILIRUBINUR NEGATIVE 04/19/2014 1730   KETONESUR NEGATIVE 04/19/2014 1730   PROTEINUR NEGATIVE 04/19/2014 1730    UROBILINOGEN 0.2 04/19/2014 1730   NITRITE NEGATIVE 04/19/2014 1730   LEUKOCYTESUR NEGATIVE 04/19/2014 1730   Sepsis Labs: @LABRCNTIP (procalcitonin:4,lacticidven:4)  ) Recent Results (from the past 240 hour(s))  MRSA PCR Screening     Status: None   Collection Time: 03/18/16  1:10 AM  Result Value Ref Range Status   MRSA by PCR NEGATIVE NEGATIVE Final    Comment:        The GeneXpert MRSA Assay (FDA approved for NASAL specimens only), is one component of a comprehensive MRSA colonization surveillance program. It is not intended to diagnose MRSA infection nor to guide or monitor treatment for MRSA infections.      Invalid input(s): PROCALCITONIN, LACTICACIDVEN   Radiology Studies: Dg Abd 1 View  Result Date: 03/18/2016 CLINICAL DATA:  Abdominal distension EXAM: ABDOMEN - 1 VIEW COMPARISON:  04/12/2014 FINDINGS: Scattered large and small bowel gas is noted. No obstructive changes are seen. No free air is noted. Postsurgical  changes in the left hip are again seen. Degenerative change of the lumbar spine is again noted. IMPRESSION: No acute abnormality noted. Electronically Signed   By: Inez Catalina M.D.   On: 03/18/2016 09:01   Dg Chest Portable 1 View  Result Date: 03/17/2016 CLINICAL DATA:  Acute onset of shortness of breath. Difficulty breathing. Decreased O2 saturation. Initial encounter. EXAM: PORTABLE CHEST 1 VIEW COMPARISON:  Chest radiograph performed 04/19/2014 FINDINGS: The lungs are well-aerated. Vascular congestion is noted. Small bilateral pleural effusions are seen. Increased interstitial markings raise concern for pulmonary edema. There is no evidence of pneumothorax. The cardiomediastinal silhouette is borderline normal in size. No acute osseous abnormalities are seen. IMPRESSION: Vascular congestion noted. Small bilateral pleural effusions seen. Increased interstitial markings raise concern for pulmonary edema. Electronically Signed   By: Garald Balding M.D.   On:  03/17/2016 21:18        Scheduled Meds: . acidophilus  1 capsule Oral Daily  . antiseptic oral rinse  7 mL Mouth Rinse q12n4p  . atorvastatin  20 mg Oral Daily  . carbamide peroxide  2 drop Both Ears Q Sun  . chlorhexidine  15 mL Mouth Rinse BID  . digoxin  125 mcg Oral Daily  . diltiazem  240 mg Oral Daily  . furosemide  40 mg Intravenous Q12H  . gabapentin  100 mg Oral BID  . insulin aspart  0-5 Units Subcutaneous QHS  . insulin aspart  0-9 Units Subcutaneous TID WC  . levothyroxine  25 mcg Oral QPM  . lisinopril  10 mg Oral Daily  . nebivolol  10 mg Oral q morning - 10a  . potassium chloride  40 mEq Oral Daily  . sodium chloride flush  3 mL Intravenous Q12H   Continuous Infusions: . nitroGLYCERIN Stopped (03/19/16 1137)     LOS: 2 days    Time spent: 35 minutes    Virgal Warmuth A, MD [facility] Pager [phone]  If 7PM-7AM, please contact night-coverage [email or web address] Password [email or web address] 03/19/2016, 12:18 PM

## 2016-03-19 NOTE — Progress Notes (Signed)
ANTICOAGULATION CONSULT NOTE  Pharmacy Consult for coumadin Indication: atrial fibrillation  No Known Allergies  Patient Measurements: Height: 5\' 3"  (160 cm) Weight: 120 lb 13 oz (54.8 kg) IBW/kg (Calculated) : 52.4   Vital Signs: Temp: 98.3 F (36.8 C) (08/08 1219) Temp Source: Oral (08/08 1219)  Labs:  Recent Labs  03/17/16 2005 03/18/16 0054 03/18/16 0534 03/18/16 0842 03/18/16 1251 03/19/16 0446 03/19/16 1153  HGB 13.8  --   --   --   --   --   --   HCT 42.1  --   --   --   --   --   --   PLT 287  --   --   --   --   --   --   LABPROT 32.0* 33.2*  --   --   --   --  23.9*  INR 3.02 3.17  --   --   --   --  2.10  CREATININE 0.65 0.56 0.68 0.67  --  0.61  --   TROPONINI <0.03 <0.03 <0.03  --  <0.03  --   --     Estimated Creatinine Clearance: 36.3 mL/min (by C-G formula based on SCr of 0.8 mg/dL).   Medical History: Past Medical History:  Diagnosis Date  . Arteriosclerotic cardiovascular disease (ASCVD)    cath in 2001- 40% LAD and 50% RCA; stent for 80% LAD in 5/04; residual 50% LAD, 80% small D1, 70% small OM1 50% ostial RCA, nl EF   . Atrial fibrillation ([place]) 2007   CHF with preserved LV systolic function - [id number]; moderate LVH  . Bronchitis    history  . Carcinoma of breast ([place])    left masectomy in 1995  . Chronic diastolic heart failure ([place]) 11/21/2009  . Chronic hoarseness   . CVD (cerebrovascular disease)    plaque w/o focal disease in 2006; h/o CVA  . Diabetes mellitus type II    no insulin   . Edema   . Herpes zoster   . Hypertension   . Hypothyroid   . Lower extremity edema 08/20/2011  . MVP (mitral valve prolapse)    moderate; with moderate MR  . Osteoporosis   . Peripheral neuropathy ([place])   . PVD (peripheral vascular disease) (HCC)    ABIs of 0.64 and 0.59, right and left leg in 2009  . 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 Shingles   . Ulcer    left lower leg  . Varicose veins of legs 08/20/2011    Medications:  Prescriptions Prior to  Admission  Medication Sig Dispense Refill Last Dose  . acetaminophen (TYLENOL) 325 MG tablet Take 650 mg by mouth 2 (two) times daily.   03/17/2016 at 1400  . atorvastatin (LIPITOR) 20 MG tablet Take 20 mg by mouth daily.   03/17/2016 at 1700  . carbamide peroxide (DEBROX) 6.5 % otic solution Place 2 drops into both ears every Sunday. ADMINISTERED AT BEDTIME ONCE WEEKLY-SUNDAYS   Past Month at Unknown time  . digoxin (LANOXIN) 0.125 MG tablet Take 125 mcg by mouth daily. Takes after lunch   03/17/2016 at 1000  . diltiazem (TIAZAC) 240 MG 24 hr capsule Take 240 mg by mouth daily.   03/17/2016 at 1000  . furosemide (LASIX) 20 MG tablet Take 20 mg by mouth every morning.    03/17/2016 at 1000  . gabapentin (NEURONTIN) 100 MG capsule Take 100 mg by mouth 2 (two) times daily.    03/17/2016 at 1000  .  levothyroxine (SYNTHROID, LEVOTHROID) 25 MCG tablet Take 25 mcg by mouth every evening.    03/16/2016 at 2100  . Liniments (SALONPAS EX) Apply 1 application topically daily as needed (APPLIED TO KNEE).   UNKNOWN  . nebivolol (BYSTOLIC) 10 MG tablet Take 10 mg by mouth every morning.    03/17/2016 at Unknown time  . ondansetron (ZOFRAN) 4 MG tablet Take 4 mg by mouth 2 (two) times daily.   03/17/2016 at 1800  . Probiotic Product (PROBIOTIC DAILY PO) Take 1 capsule by mouth daily.   03/17/2016 at [place]  . Vitamin D, Ergocalciferol, (DRISDOL) 50000 units CAPS capsule Take 50,000 Units by mouth every 30 (thirty) days.   03/13/2016 at 1000  . warfarin (COUMADIN) 2 MG tablet Take 2 mg by mouth every evening.    03/17/2016 at 1700    Assessment: 82 yo lady admitted with SOB to continue coumadin for afib.  INR today 2.1 No bleeding reported  Goal of Therapy:  INR 2-3 Monitor platelets by anticoagulation protocol: Yes   Plan:  Coumadin 2 mg po today. Daily PT/INR Monitor for bleeding complications  Maryhelen Lindler [place] 03/19/2016,1:13 PM

## 2016-03-19 NOTE — Progress Notes (Signed)
Pt's HR would momentarily drop in the 30's earlier. Paged physician. No new orders. Continue to monitor.

## 2016-03-19 NOTE — Progress Notes (Signed)
Inpatient Diabetes Program Recommendations  AACE/ADA: New Consensus Statement on Inpatient Glycemic Control (2015)  Target Ranges:  Prepandial:   less than 140 mg/dL      Peak postprandial:   less than 180 mg/dL (1-2 hours)      Critically ill patients:  140 - 180 mg/dL  Results for [name], [name] (MRN [id number]) as of 03/19/2016 11:13  Ref. Range 03/18/2016 07:30 03/18/2016 11:54 03/18/2016 17:09 03/18/2016 19:30 03/18/2016 21:29 03/19/2016 07:22  Glucose-Capillary Latest Ref Range: 65 - 99 mg/dL 222 (H) 182 (H) 161 (H) 230 (H) 223 (H) 196 (H)   Results for [name], [name] (MRN [id number]) as of 03/19/2016 11:13  Ref. Range 03/18/2016 00:54  Hemoglobin A1C Latest Ref Range: 4.8 - 5.6 % 8.2 (H)   Review of Glycemic Control  Diabetes history: DM2 Outpatient Diabetes medications: None listed on home medication list; however on office note by Dr. Harrington Challenger on 02/19/16 patient had Glipizide 5 mg QAM and Metformin 1000 mg (no frequency) listed Current orders for Inpatient glycemic control: Novolog 0-9 units TID with meals, Novolog 0-5 units QHS  Inpatient Diabetes Program Recommendations:  Insulin - Basal: Glucose has ranged form 161-230 mg/d over the past 24 hours and patient has received a total of Novolog 11 units for correction over the past 24 hours. Please consider ordering low dose basal insulin. Recommend ordering Lantus 6 units Q24H (based on 56 kg x 0.1 units). HgbA1C: A1C 8.2% on 03/18/16 indicating an average glucose of 189 mg/dl over the past 2-3 months.  Thanks, Barnie Alderman, RN, MSN, CDE Diabetes Coordinator Inpatient Diabetes Program [phone] (Team Pager from [place] to [place]) [phone] ([place] office) [phone] [facility] office) [phone] [facility] office)

## 2016-03-20 DIAGNOSIS — I482 Chronic atrial fibrillation: Secondary | ICD-10-CM

## 2016-03-20 DIAGNOSIS — I5033 Acute on chronic diastolic (congestive) heart failure: Secondary | ICD-10-CM

## 2016-03-20 LAB — GLUCOSE, CAPILLARY
Glucose-Capillary: 207 mg/dL — ABNORMAL HIGH (ref 65–99)
Glucose-Capillary: 359 mg/dL — ABNORMAL HIGH (ref 65–99)
Glucose-Capillary: 147 mg/dL — ABNORMAL HIGH (ref 65–99)
Glucose-Capillary: 264 mg/dL — ABNORMAL HIGH (ref 65–99)

## 2016-03-20 LAB — BASIC METABOLIC PANEL
ANION GAP: 9 (ref 5–15)
BUN: 16 mg/dL (ref 6–20)
CALCIUM: 8.4 mg/dL — AB (ref 8.9–10.3)
CO2: 33 mmol/L — ABNORMAL HIGH (ref 22–32)
Chloride: 90 mmol/L — ABNORMAL LOW (ref 101–111)
Creatinine, Ser: 0.78 mg/dL (ref 0.44–1.00)
Glucose, Bld: 198 mg/dL — ABNORMAL HIGH (ref 65–99)
POTASSIUM: 3.1 mmol/L — AB (ref 3.5–5.1)
Sodium: 132 mmol/L — ABNORMAL LOW (ref 135–145)

## 2016-03-20 LAB — PROTIME-INR
INR: 1.87
Prothrombin Time: 21.7 seconds — ABNORMAL HIGH (ref 11.4–15.2)

## 2016-03-20 MED ORDER — WARFARIN - PHARMACIST DOSING INPATIENT: Status: DC

## 2016-03-20 MED ORDER — WARFARIN SODIUM 5 MG PO TABS
2.5000 mg | ORAL_TABLET | Freq: Once | ORAL | Status: AC
  Administered 2016-03-20: 2.5 mg via ORAL
  Filled 2016-03-20: qty 1

## 2016-03-20 NOTE — Care Management Important Message (Signed)
Important Message  Patient Details  Name: [name] MRN: [id number] Date of Birth: [birth year]   Medicare Important Message Given:  Yes    Sherald Barge, RN 03/20/2016, 2:06 PM

## 2016-03-20 NOTE — Progress Notes (Signed)
ANTICOAGULATION CONSULT NOTE  Pharmacy Consult for coumadin Indication: atrial fibrillation  No Known Allergies  Patient Measurements: Height: 5\' 3"  (160 cm) Weight: 113 lb 1.5 oz (51.3 kg) IBW/kg (Calculated) : 52.4   Vital Signs: Temp: 97 F (36.1 C) (08/09 0807) Temp Source: Oral (08/09 0807) BP: 122/61 (08/09 1033)  Labs:  Recent Labs  03/17/16 2005 03/18/16 0054 03/18/16 0534 03/18/16 0842 03/18/16 1251 03/19/16 0446 03/19/16 1153 03/20/16 0453  HGB 13.8  --   --   --   --   --   --   --   HCT 42.1  --   --   --   --   --   --   --   PLT 287  --   --   --   --   --   --   --   LABPROT 32.0* 33.2*  --   --   --   --  23.9* 21.7*  INR 3.02 3.17  --   --   --   --  2.10 1.87  CREATININE 0.65 0.56 0.68 0.67  --  0.61  --  0.78  TROPONINI <0.03 <0.03 <0.03  --  <0.03  --   --   --     Estimated Creatinine Clearance: 35.6 mL/min (by C-G formula based on SCr of 0.8 mg/dL).   Medical History: Past Medical History:  Diagnosis Date  . Arteriosclerotic cardiovascular disease (ASCVD)    cath in 2001- 40% LAD and 50% RCA; stent for 80% LAD in 5/04; residual 50% LAD, 80% small D1, 70% small OM1 50% ostial RCA, nl EF   . Atrial fibrillation ([place]) 2007   CHF with preserved LV systolic function - [id number]; moderate LVH  . Bronchitis    history  . Carcinoma of breast ([place])    left masectomy in 1995  . Chronic diastolic heart failure ([place]) 11/21/2009  . Chronic hoarseness   . CVD (cerebrovascular disease)    plaque w/o focal disease in 2006; h/o CVA  . Diabetes mellitus type II    no insulin   . Edema   . Herpes zoster   . Hypertension   . Hypothyroid   . Lower extremity edema 08/20/2011  . MVP (mitral valve prolapse)    moderate; with moderate MR  . Osteoporosis   . Peripheral neuropathy ([place])   . PVD (peripheral vascular disease) (HCC)    ABIs of 0.64 and 0.59, right and left leg in 2009  . 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 Shingles   . Ulcer    left lower leg  .  Varicose veins of legs 08/20/2011    Medications:  Prescriptions Prior to Admission  Medication Sig Dispense Refill Last Dose  . acetaminophen (TYLENOL) 325 MG tablet Take 650 mg by mouth 2 (two) times daily.   03/17/2016 at 1400  . atorvastatin (LIPITOR) 20 MG tablet Take 20 mg by mouth daily.   03/17/2016 at 1700  . carbamide peroxide (DEBROX) 6.5 % otic solution Place 2 drops into both ears every Sunday. ADMINISTERED AT BEDTIME ONCE WEEKLY-SUNDAYS   Past Month at Unknown time  . digoxin (LANOXIN) 0.125 MG tablet Take 125 mcg by mouth daily. Takes after lunch   03/17/2016 at 1000  . diltiazem (TIAZAC) 240 MG 24 hr capsule Take 240 mg by mouth daily.   03/17/2016 at 1000  . furosemide (LASIX) 20 MG tablet Take 20 mg by mouth every morning.    03/17/2016 at 1000  .  gabapentin (NEURONTIN) 100 MG capsule Take 100 mg by mouth 2 (two) times daily.    03/17/2016 at 1000  . levothyroxine (SYNTHROID, LEVOTHROID) 25 MCG tablet Take 25 mcg by mouth every evening.    03/16/2016 at 2100  . Liniments (SALONPAS EX) Apply 1 application topically daily as needed (APPLIED TO KNEE).   UNKNOWN  . nebivolol (BYSTOLIC) 10 MG tablet Take 10 mg by mouth every morning.    03/17/2016 at Unknown time  . ondansetron (ZOFRAN) 4 MG tablet Take 4 mg by mouth 2 (two) times daily.   03/17/2016 at 1800  . Probiotic Product (PROBIOTIC DAILY PO) Take 1 capsule by mouth daily.   03/17/2016 at [place]  . Vitamin D, Ergocalciferol, (DRISDOL) 50000 units CAPS capsule Take 50,000 Units by mouth every 30 (thirty) days.   03/13/2016 at 1000  . warfarin (COUMADIN) 2 MG tablet Take 2 mg by mouth every evening.    03/17/2016 at 1700    Assessment: 82 yo lady admitted with SOB to continue coumadin for afib.  INR today 1.87 No bleeding reported   Goal of Therapy:  INR 2-3 Monitor platelets by anticoagulation protocol: Yes   Plan:  Coumadin 2.5 mg po today to boost INR. Daily PT/INR Monitor for bleeding complications  Hart Robinsons A 03/20/2016,11:24 AM

## 2016-03-20 NOTE — NC FL2 (Signed)
[place] LEVEL OF CARE SCREENING TOOL     IDENTIFICATION  Patient Name: [name] Birthdate: [birth year] Sex: female Admission Date (Current Location): 03/17/2016  [place] and [place] Number:  [place] [id number] [place] and Address:  [facility] [address] [place]      Provider Number: [id number]  Attending Physician Name and Address:  Barton Dubois, MD  Relative Name and Phone Number:       Current Level of Care: Hospital Recommended Level of Care: [place] Prior Approval Number:    Date Approved/Denied:   PASRR Number: [id number]  Discharge Plan: SNF    Current Diagnoses: Patient Active Problem List   Diagnosis Date Noted  . Acute respiratory failure with hypoxia ([place]) 03/18/2016  . Hyponatremia 03/18/2016  . Acute pulmonary edema ([place]) 03/17/2016  . Acute CHF (congestive heart failure) ([place]) 03/17/2016  . Abdominal distention 04/13/2014  . Hip pain 04/12/2014  . Labral tear of hip, degenerative 04/12/2014  . Trochanteric bursitis of right hip 04/12/2014  . Encounter for therapeutic drug monitoring 10/06/2013  . Lower extremity edema 08/20/2011  . Arteriosclerotic cardiovascular disease (ASCVD)   . Carcinoma of breast ([place])   . Atrial fibrillation ([place])   . MVP (mitral valve prolapse)   . PVD (peripheral vascular disease) ([place])   . CVD (cerebrovascular disease)   . Hypothyroid   . Osteoporosis   . Chronic anticoagulation 11/22/2010  . DIABETES MELLITUS, TYPE II 11/21/2009  . Hyperlipidemia 11/21/2009  . Hypertension 11/21/2009  . SPINAL STENOSIS, LUMBAR 09/05/2008    Orientation RESPIRATION BLADDER Height & Weight     Self, Time, Situation, Place  Normal Indwelling catheter Weight: 113 lb 1.5 oz (51.3 kg) Height:  5\' 3"  (160 cm)  BEHAVIORAL SYMPTOMS/MOOD NEUROLOGICAL BOWEL NUTRITION STATUS  Other (Comment) (n/a)  (n/a) Incontinent Diet (Heart healthy)  AMBULATORY STATUS COMMUNICATION  OF NEEDS Skin   Extensive Assist Verbally Normal                       Personal Care Assistance Level of Assistance  Bathing, Feeding, Dressing Bathing Assistance: Limited assistance Feeding assistance: Limited assistance Dressing Assistance: Limited assistance     Functional Limitations Info  Sight, Hearing, Speech Sight Info: Impaired Hearing Info: Impaired Speech Info: Adequate    SPECIAL CARE FACTORS FREQUENCY                       Contractures Contractures Info: Not present    Additional Factors Info  Insulin Sliding Scale Code Status Info: Full code Allergies Info: No known allergies           Current Medications (03/20/2016):  This is the current hospital active medication list Current Facility-Administered Medications  Medication Dose Route Frequency Provider Last Rate Last Dose  . 0.9 %  sodium chloride infusion  250 mL Intravenous PRN Bonnell Public, MD      . acetaminophen (TYLENOL) tablet 650 mg  650 mg Oral Q4H PRN Bonnell Public, MD   650 mg at 03/19/16 2112  . acidophilus (RISAQUAD) capsule 1 capsule  1 capsule Oral Daily Verlee Monte, MD   1 capsule at 03/20/16 1027  . antiseptic oral rinse (CPC / CETYLPYRIDINIUM CHLORIDE 0.05%) solution 7 mL  7 mL Mouth Rinse q12n4p Bonnell Public, MD   7 mL at 03/19/16 1600  . atorvastatin (LIPITOR) tablet 20 mg  20 mg Oral Daily Bonnell Public, MD  20 mg at 03/20/16 1027  . carbamide peroxide (DEBROX) 6.5 % otic solution 2 drop  2 drop Both Ears Q Carrington Clamp, MD      . chlorhexidine (PERIDEX) 0.12 % solution 15 mL  15 mL Mouth Rinse BID Bonnell Public, MD   15 mL at 03/20/16 1026  . digoxin (LANOXIN) tablet 125 mcg  125 mcg Oral Daily Bonnell Public, MD   125 mcg at 03/19/16 1111  . diltiazem (CARDIZEM CD) 24 hr capsule 240 mg  240 mg Oral Daily Bonnell Public, MD   240 mg at 03/20/16 1027  . furosemide (LASIX) injection 60 mg  60 mg Intravenous BID Verlee Monte, MD   60  mg at 03/20/16 0815  . gabapentin (NEURONTIN) capsule 100 mg  100 mg Oral BID Bonnell Public, MD   100 mg at 03/20/16 1027  . insulin aspart (novoLOG) injection 0-5 Units  0-5 Units Subcutaneous QHS Bonnell Public, MD   2 Units at 03/18/16 2139  . insulin aspart (novoLOG) injection 0-9 Units  0-9 Units Subcutaneous TID WC Bonnell Public, MD   3 Units at 03/20/16 0815  . isosorbide mononitrate (IMDUR) 24 hr tablet 30 mg  30 mg Oral Daily Verlee Monte, MD   30 mg at 03/20/16 1027  . levothyroxine (SYNTHROID, LEVOTHROID) tablet 25 mcg  25 mcg Oral QPM Bonnell Public, MD   25 mcg at 03/19/16 1822  . lisinopril (PRINIVIL,ZESTRIL) tablet 20 mg  20 mg Oral Daily Verlee Monte, MD   20 mg at 03/20/16 1027  . nebivolol (BYSTOLIC) tablet 10 mg  10 mg Oral q morning - 10a Verlee Monte, MD   10 mg at 03/20/16 1029  . ondansetron (ZOFRAN) injection 4 mg  4 mg Intravenous Q6H PRN Bonnell Public, MD      . potassium chloride SA (K-DUR,KLOR-CON) CR tablet 40 mEq  40 mEq Oral Daily Verlee Monte, MD   40 mEq at 03/20/16 1029  . sodium chloride flush (NS) 0.9 % injection 3 mL  3 mL Intravenous Q12H Bonnell Public, MD   3 mL at 03/20/16 1030  . sodium chloride flush (NS) 0.9 % injection 3 mL  3 mL Intravenous PRN Bonnell Public, MD      . Warfarin - Pharmacist Dosing Inpatient   Does not apply [id number] Verlee Monte, MD         Discharge Medications: Please see discharge summary for a list of discharge medications.  Relevant Imaging Results:  Relevant Lab Results:   Additional Information    Salome Arnt, [place]

## 2016-03-20 NOTE — Progress Notes (Addendum)
PROGRESS NOTE  [name]  [id number] DOB: 1921-01-14 DOA: 03/17/2016 PCP: Wende Neighbors, MD  Brief Narrative:  82 year old female with cardiac problems, including CAD and atrial fibrillation. Patient is not a good historian. 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 Patient is not able to give significant history. Patient presented to the ER with SOB, and CXR revealed pulmonary edema. Patient required BiPAP on admission.  Assessment & Plan:   Principal Problem:   Acute CHF (congestive heart failure) (HCC) Active Problems:   Hypertension   Atrial fibrillation (HCC)   Acute pulmonary edema (HCC)   Acute respiratory failure with hypoxia (HCC)   Hyponatremia   Acute on chronic diastolic CHF -Presented with lower extremity edema, elevated BNP and CXR findings of pulmonary edema. -On beta blockers, lisinopril, digoxin and diuresis (anticipating transition to PO on 03/21/16, and potentially discharge back to SNF). -Following intake/output, renal function and daily weight. -Restrict sodium and fluid intake.  -2-D echo showed ejection fraction of 60-65% -will adjust lasix home dose at discharge  Acute pulmonary edema -This is likely cardiogenic pulmonary edema secondary to acute diastolic CHF. -improving -will repeat CXR in am -continue diuresis   Acute respiratory failure with hypoxia -Patient with hypoxia to the point she required noninvasive ventilation with BiPAP on admission. -Currently weaned off to nasal cannula and breathing ok -will monitor and continue weaning process as tolerated   Atrial fibrillation -Chronic atrial fibrillation, her CHA2DS2-VASc score is greater than 3. -Rate is controlled with beta blockers, digoxin and Cardizem.  -She is anticoagulated with warfarin; pharmacy adjusting dose -INR 1.87 today  Essential hypertension -Blood pressure is better and slightly soft now -NTG discontinued on 03/19/16 -will continue home antihypertensive agents    Diabetes mellitus type 2 -continue insulin sliding scale.   DVT prophylaxis:  Code Status: Full Code Family Communication:  Disposition Plan:  Diet: Diet Heart Room service appropriate? Yes; Fluid consistency: Thin  Consultants:   None  Procedures:   None  Antimicrobials:   None   Objective: Vitals:   03/20/16 1149 03/20/16 1200 03/20/16 1300 03/20/16 1437  BP:  (!) 96/52 (!) 71/37 (!) 90/44  Pulse: 73 61 61 62  Resp:  (!) 23 (!) 29 20  Temp:    98.1 F (36.7 C)  TempSrc:    Oral  SpO2:  99% 100% 99%  Weight:    55.1 kg (121 lb 7.6 oz)  Height:    5\' 3"  (1.6 m)    Intake/Output Summary (Last 24 hours) at 03/20/16 1847 Last data filed at 03/20/16 1349  Gross per 24 hour  Intake                0 ml  Output             4250 ml  Net            -4250 ml   Filed Weights   03/19/16 0500 03/20/16 0500 03/20/16 1437  Weight: 54.8 kg (120 lb 13 oz) 51.3 kg (113 lb 1.5 oz) 55.1 kg (121 lb 7.6 oz)    Examination: General exam: Appears calm and comfortable; denies CP and endorses breathing is better. Still requiring oxygen and with signs of LE edema. Respiratory system: Respiratory effort normal. Decrease Bs at bases, but no frank crackles heard. Cardiovascular system: rate control. No JVD, rubs, gallops or clicks. 1+  pedal edema. Gastrointestinal system: Abdomen is nondistended, soft and nontender. No organomegaly or masses felt. Normal bowel sounds heard. Central  nervous system: Alert and oriented. No focal neurological deficits. Skin: No rashes, no petechiae and no open ulcers  Data Reviewed: I have personally reviewed following labs and imaging studies  CBC:  Recent Labs Lab 03/17/16 2005  WBC 12.4*  HGB 13.8  HCT 42.1  MCV 87.2  PLT [id number]   Basic Metabolic Panel:  Recent Labs Lab 03/18/16 0054 03/18/16 0534 03/18/16 0842 03/19/16 0446 03/20/16 0453  NA 132* 136 134* 138 132*  K 3.9 3.7 3.5 3.6 3.1*  CL 98* 99* 100* 101 90*  CO2 28 28 28 28   33*  GLUCOSE 301* 246* 203* 214* 198*  BUN 13 12 13 14 16   CREATININE 0.56 0.68 0.67 0.61 0.78  CALCIUM 7.9* 8.0* 7.8* 8.2* 8.4*  MG 1.7  --   --   --   --    GFR: Estimated Creatinine Clearance: 36.3 mL/min (by C-G formula based on SCr of 0.8 mg/dL).  Recent Labs Lab 03/17/16 2005 03/18/16 0054 03/19/16 1153 03/20/16 0453  INR 3.02 3.17 2.10 1.87   Cardiac Enzymes:  Recent Labs Lab 03/17/16 2005 03/18/16 0054 03/18/16 0534 03/18/16 1251  TROPONINI <0.03 <0.03 <0.03 <0.03   HbA1C:  Recent Labs  03/18/16 0054  HGBA1C 8.2*   CBG:  Recent Labs Lab 03/19/16 1701 03/19/16 2125 03/20/16 0730 03/20/16 1144 03/20/16 1609  GLUCAP 166* 191* 207* 359* 147*   Urine analysis:    Component Value Date/Time   COLORURINE YELLOW 04/19/2014 1730   APPEARANCEUR CLEAR 04/19/2014 1730   LABSPEC <1.005 (L) 04/19/2014 1730   PHURINE 6.0 04/19/2014 1730   GLUCOSEU NEGATIVE 04/19/2014 1730   HGBUR NEGATIVE 04/19/2014 1730   BILIRUBINUR NEGATIVE 04/19/2014 1730   KETONESUR NEGATIVE 04/19/2014 1730   PROTEINUR NEGATIVE 04/19/2014 1730   UROBILINOGEN 0.2 04/19/2014 1730   NITRITE NEGATIVE 04/19/2014 1730   LEUKOCYTESUR NEGATIVE 04/19/2014 1730   Sepsis Labs: @LABRCNTIP (procalcitonin:4,lacticidven:4)  ) Recent Results (from the past 240 hour(s))  MRSA PCR Screening     Status: None   Collection Time: 03/18/16  1:10 AM  Result Value Ref Range Status   MRSA by PCR NEGATIVE NEGATIVE Final    Comment:        The GeneXpert MRSA Assay (FDA approved for NASAL specimens only), is one component of a comprehensive MRSA colonization surveillance program. It is not intended to diagnose MRSA infection nor to guide or monitor treatment for MRSA infections.      Invalid input(s): PROCALCITONIN, [place]   Radiology Studies: No results found.  Scheduled Meds: . acidophilus  1 capsule Oral Daily  . antiseptic oral rinse  7 mL Mouth Rinse q12n4p  . atorvastatin  20  mg Oral Daily  . carbamide peroxide  2 drop Both Ears Q Sun  . chlorhexidine  15 mL Mouth Rinse BID  . digoxin  125 mcg Oral Daily  . diltiazem  240 mg Oral Daily  . furosemide  60 mg Intravenous BID  . gabapentin  100 mg Oral BID  . insulin aspart  0-5 Units Subcutaneous QHS  . insulin aspart  0-9 Units Subcutaneous TID WC  . isosorbide mononitrate  30 mg Oral Daily  . levothyroxine  25 mcg Oral QPM  . lisinopril  20 mg Oral Daily  . nebivolol  10 mg Oral q morning - 10a  . potassium chloride  40 mEq Oral Daily  . sodium chloride flush  3 mL Intravenous Q12H  . Warfarin - Pharmacist Dosing Inpatient   Does not apply Q24H  Continuous Infusions:     LOS: 3 days    Time spent: 30 minutes    Barton Dubois, MD [facility] Pager [phone]  If 7PM-7AM, please contact night-coverage [email or web address] Password [facility] 03/20/2016, 6:47 PM

## 2016-03-20 NOTE — Clinical Social Work Note (Signed)
Clinical Social Work Assessment  Patient Details  Name: [name] MRN: [id number] Date of Birth: 1920-11-08  Date of referral:  03/20/16               Reason for consult:  Discharge Planning                Permission sought to share information with:  Facility Art therapist granted to share information::  Yes, Verbal Permission Granted  Name::        Agency::  [facility]  Relationship::  facility  Contact Information:     Housing/Transportation Living arrangements for the past 2 months:  [place] of Information:  Patient, Facility Patient Interpreter Needed:  None Criminal Activity/Legal Involvement Pertinent to Current Situation/Hospitalization:  No - Comment as needed Significant Relationships:  Adult Children Lives with:  Facility Resident Do you feel safe going back to the place where you live?  Yes Need for family participation in patient care:  Yes (Comment)  Care giving concerns:  None reported. Pt is long term resident at [facility].    Social Worker assessment / plan:  CSW met with pt at bedside in ICU. Pt alert and oriented and reports she has been a resident at [facility] for almost two years. Her son, Yvone Neu is very involved and supportive. She also has a son who lives in [place]. Per Jackelyn Poling at facility, pt is nursing level of care and okay to return. She requires assist with transfers and uses wheelchair.   Employment status:  Retired Nurse, adult PT Recommendations:  Not assessed at this time Information / Referral to community resources:  Other (Comment Required) (Return to facility)  Patient/Family's Response to care:  Pt requests return to [facility] when medically stable.   Patient/Family's Understanding of and Emotional Response to Diagnosis, Current Treatment, and Prognosis:  Pt appears to be understanding of admission diagnosis. She plans to return to SNF.   Emotional Assessment Appearance:  Appears  stated age Attitude/Demeanor/Rapport:  Other (Cooperative) Affect (typically observed):  Appropriate Orientation:  Oriented to Place, Oriented to Self, Oriented to  Time, Oriented to Situation Alcohol / Substance use:  Not Applicable Psych involvement (Current and /or in the community):  No (Comment)  Discharge Needs  Concerns to be addressed:  Discharge Planning Concerns Readmission within the last 30 days:  No Current discharge risk:  None Barriers to Discharge:  No Barriers Identified   Salome Arnt, [place] 03/20/2016, 9:33 AM [phone]

## 2016-03-20 NOTE — Progress Notes (Signed)
Inpatient Diabetes Program Recommendations  AACE/ADA: New Consensus Statement on Inpatient Glycemic Control (2015)  Target Ranges:  Prepandial:   less than 140 mg/dL      Peak postprandial:   less than 180 mg/dL (1-2 hours)      Critically ill patients:  140 - 180 mg/dL   Lab Results  Component Value Date   GLUCAP 207 (H) 03/20/2016   HGBA1C 8.2 (H) 03/18/2016    Review of Glycemic Control  Inpatient Diabetes Program Recommendations: Fasting glucose elevated into lower 200 range. May want to add some low dose basal insulin to slightly lower the fasting and then affecting the rest of the day. Recommend lantus 6 units (based on 0.1 units/kg)  Thank you Rosita Kea, RN, MSN, CDE  Diabetes Inpatient Program Office: [phone] Pager: [phone] 8:00 am to 5:00 pm

## 2016-03-21 ENCOUNTER — Inpatient Hospital Stay (HOSPITAL_COMMUNITY): Payer: Medicare Other

## 2016-03-21 DIAGNOSIS — E039 Hypothyroidism, unspecified: Secondary | ICD-10-CM

## 2016-03-21 DIAGNOSIS — G3183 Dementia with Lewy bodies: Secondary | ICD-10-CM

## 2016-03-21 DIAGNOSIS — F0280 Dementia in other diseases classified elsewhere without behavioral disturbance: Secondary | ICD-10-CM

## 2016-03-21 DIAGNOSIS — R0602 Shortness of breath: Secondary | ICD-10-CM

## 2016-03-21 LAB — BASIC METABOLIC PANEL
Anion gap: 10 (ref 5–15)
BUN: 22 mg/dL — AB (ref 6–20)
CHLORIDE: 86 mmol/L — AB (ref 101–111)
CO2: 32 mmol/L (ref 22–32)
CREATININE: 0.78 mg/dL (ref 0.44–1.00)
Calcium: 8.1 mg/dL — ABNORMAL LOW (ref 8.9–10.3)
GFR calc Af Amer: >60 mL/min (ref >60)
Glucose, Bld: 166 mg/dL — ABNORMAL HIGH (ref 65–99)
Potassium: 2.8 mmol/L — ABNORMAL LOW (ref 3.5–5.1)
Sodium: 128 mmol/L — ABNORMAL LOW (ref 135–145)

## 2016-03-21 LAB — GLUCOSE, CAPILLARY
Glucose-Capillary: 180 mg/dL — ABNORMAL HIGH (ref 65–99)
Glucose-Capillary: 307 mg/dL — ABNORMAL HIGH (ref 65–99)

## 2016-03-21 LAB — PROTIME-INR
INR: 1.86
PROTHROMBIN TIME: 21.7 s — AB (ref 11.4–15.2)

## 2016-03-21 LAB — MAGNESIUM: MAGNESIUM: 1.7 mg/dL (ref 1.7–2.4)

## 2016-03-21 MED ORDER — ISOSORBIDE MONONITRATE ER 30 MG PO TB24: 15.0000 mg | ORAL_TABLET | Freq: Every day | ORAL | Status: DC

## 2016-03-21 MED ORDER — FUROSEMIDE 20 MG PO TABS: 40.0000 mg | ORAL_TABLET | Freq: Two times a day (BID) | ORAL | Status: DC

## 2016-03-21 MED ORDER — POTASSIUM CHLORIDE CRYS ER 20 MEQ PO TBCR: 40.0000 meq | EXTENDED_RELEASE_TABLET | Freq: Every day | ORAL | Status: DC

## 2016-03-21 MED ORDER — WARFARIN SODIUM 2.5 MG PO TABS: 2.5000 mg | ORAL_TABLET | Freq: Every day | ORAL | Status: DC

## 2016-03-21 MED ORDER — LISINOPRIL 20 MG PO TABS
20.0000 mg | ORAL_TABLET | Freq: Every day | ORAL | Status: DC
Start: 2016-03-21 — End: 2017-03-09

## 2016-03-21 MED ORDER — FUROSEMIDE 40 MG PO TABS
40.0000 mg | ORAL_TABLET | Freq: Two times a day (BID) | ORAL | Status: DC
  Administered 2016-03-21: 40 mg via ORAL
  Filled 2016-03-21: qty 1

## 2016-03-21 MED ORDER — POTASSIUM CHLORIDE CRYS ER 20 MEQ PO TBCR
40.0000 meq | EXTENDED_RELEASE_TABLET | Freq: Two times a day (BID) | ORAL | Status: DC
  Administered 2016-03-21: 40 meq via ORAL
  Filled 2016-03-21: qty 2

## 2016-03-21 MED ORDER — WARFARIN SODIUM 5 MG PO TABS
2.5000 mg | ORAL_TABLET | Freq: Once | ORAL | Status: AC
  Administered 2016-03-21: 2.5 mg via ORAL
  Filled 2016-03-21: qty 1

## 2016-03-21 NOTE — Clinical Social Work Note (Signed)
Pt d/c today back to [facility]. Pt and facility aware and agreeable. Pt requesting transport via [facility]. CSW notified pt's son, Chrissie Noa by [employer].  Benay Pike, [place]

## 2016-03-21 NOTE — Progress Notes (Signed)
Inpatient Diabetes Program Recommendations  AACE/ADA: New Consensus Statement on Inpatient Glycemic Control (2015)  Target Ranges:  Prepandial:   less than 140 mg/dL      Peak postprandial:   less than 180 mg/dL (1-2 hours)      Critically ill patients:  140 - 180 mg/dL  Results for [name], [name] (MRN [id number]) as of 03/21/2016 09:02  Ref. Range 03/20/2016 07:30 03/20/2016 11:44 03/20/2016 16:09 03/20/2016 20:30 03/21/2016 07:37  Glucose-Capillary Latest Ref Range: 65 - 99 mg/dL 207 (H)  Novolog 3 units 359 (H)  Novolog 9 units 147 (H)  Novolog 1 unit 264 (H)  Novolog 3 units 180 (H)  Novolog 2 units    Review of Glycemic Control Diabetes history:DM2 Outpatient Diabetes medications: None listed on home medication list; however on office note by Dr. Harrington Challenger on 02/19/16 patient had Glipizide 5 mg QAM and Metformin 1000 mg (no frequency) listed Current orders for Inpatient glycemic control: Novolog 0-9 units TID with meals, Novolog 0-5 units QHS  Inpatient Diabetes Program Recommendations:  Insulin - Basal: Glucose has ranged form 147-359 mg/d over the past 24 hours and patient has received a total of Novolog 18 units for correction over the past 24 hours. Please consider ordering low dose basal insulin. Recommend ordering Lantus 6 units Q24H (based on 56 kg x 0.1 units). HgbA1C: A1C 8.2% on 03/18/16 indicating an average glucose of 189 mg/dl over the past 2-3 months  Thanks, Barnie Alderman, RN, MSN, CDE Diabetes Coordinator Inpatient Diabetes Program [phone] (Team Pager from [place] to [place]) [phone] ([facility] office) [phone] [facility] office) [phone] [facility] office)

## 2016-03-21 NOTE — Discharge Summary (Signed)
Physician Discharge Summary  [name] [id number] DOB: [birth year] DOA: 03/17/2016  PCP: Wende Neighbors, MD  Admit date: 03/17/2016 Discharge date: 03/21/2016  Time spent: 35 minutes  Recommendations for Outpatient Follow-up:  Follow up BP and adjust antihypertensive regimen as needed  Close follow up of INR and adjust coumadin dose (goal is 2-3) Repeat BMET to follow electrolytes and renal function   Discharge Diagnoses:  Principal Problem:   Acute CHF (congestive heart failure) ([place]) Active Problems:   Hypertension   Atrial fibrillation (HCC)   Acute pulmonary edema (HCC)   Acute respiratory failure with hypoxia (HCC)   Hyponatremia   SOB (shortness of breath)   Lewy body dementia without behavioral disturbance   Discharge Condition: stable and improved. Discharge to SNF for further care and treatment. Outpatient follow up with PCP in 10 days.  Diet recommendation: low sodium diet and modified carbohydrates   Filed Weights   03/20/16 0500 03/20/16 1437 03/21/16 0547  Weight: 51.3 kg (113 lb 1.5 oz) 55.1 kg (121 lb 7.6 oz) 52.1 kg (114 lb 13.8 oz)    Brief history of present illness:  82 year old female with cardiac problems, including CAD and atrial fibrillation. Patient is not a good historian. 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 Patient is not able to give significant history. Patient presented to the ER with SOB, and CXR revealed pulmonary edema. Patient required BiPAP on admission.  Hospital Course:  Acute on chronic diastolic CHF -Presented with lower extremity edema, elevated BNP and CXR findings of pulmonary edema. -On beta blockers, lisinopril, digoxin and diuresis  -advise to follow low sodium diet and daily weights. -2-D echo showed ejection fraction of 60-65% -at discharge, lasix dose has been adjusted to 40mg  BID  Acute pulmonary edema -This is likely cardiogenic pulmonary edema secondary to acute on chronic diastolic CHF. -improved/essentially  resolved on repeat CXR -continue diuresis at adjusted dose of lasix  -advise to follow low sodium diet   Acute respiratory failure with hypoxia -Patient with hypoxia to the point she required noninvasive ventilation with BiPAP on admission. -Currently weaned off of nasal cannula and breathing ok; good O2 sat on RA -appears to be secondary to pulmonary edema and acute on chronic diastolic HF  Atrial fibrillation -Chronic atrial fibrillation, her CHA2DS2-VASc score is greater than 3. -Rate is controlled with beta blockers, digoxin and Cardizem.  -She is anticoagulated with warfarin -INR 1.86 today -coumadin dose adjusted to 2.5 mg daily until INR therapeutic -will need close follow up of INR and adjustments to her warfarin dose as needed   Essential hypertension -Blood pressure is better and well control currently -advise to follow low sodium diet -follow up to BP and further adjustment to antihypertensive regimen to be done as needed  Diabetes mellitus type 2 -received SSI while inpatient -as per patient and family using just diet control -follow up with PCP and further adjustment/treatment decisions to be taken during that Visit as needed  HLD -will continue statins   Vit D deficiency -will continue weekly high dose Vit D supplementation   Procedures:  See below for x-ray reports   Consultations:  None   Discharge Exam: Vitals:   03/21/16 0827 03/21/16 1218  BP: (!) 134/98   Pulse: 87 65  Resp:    Temp:     General exam: Appears calm and comfortable; denies CP and endorses breathing is a lot better. Good O2 sat on RA. CXR with significant improvement in her aeration and essentially resolved  pulmonary edema.  Respiratory system: Respiratory effort normal. No frank crackles heard, no wheezing. Cardiovascular system: rate control. No JVD, rubs, gallops or clicks. Trace pedal edema. Gastrointestinal system: Abdomen is nondistended, soft and nontender. No  organomegaly or masses felt. Normal bowel sounds heard. Central nervous system: Alert and oriented X2. No focal neurological deficits. Skin: No rashes, no petechiae and no open ulcers  Discharge Instructions   Discharge Instructions    Diet - low sodium heart healthy    Complete by:  As directed   Discharge instructions    Complete by:  As directed   Low sodium diet (less than 2 gram daily) Daily weights Ensure good hydration and nutrition Please follow INR closely (coumadin increased to 2.5 for the next 2 days); adjust dose further as per IRN level (previosuly using 2mg  daily) Follow up with PCP in 10 days     Current Discharge Medication List    START taking these medications   Details  isosorbide mononitrate (IMDUR) 30 MG 24 hr tablet Take 0.5 tablets (15 mg total) by mouth daily.    lisinopril (PRINIVIL,ZESTRIL) 20 MG tablet Take 1 tablet (20 mg total) by mouth daily.    potassium chloride SA (K-DUR,KLOR-CON) 20 MEQ tablet Take 2 tablets (40 mEq total) by mouth daily.      CONTINUE these medications which have CHANGED   Details  furosemide (LASIX) 20 MG tablet Take 2 tablets (40 mg total) by mouth 2 (two) times daily.    warfarin (COUMADIN) 2.5 MG tablet Take 1 tablet (2.5 mg total) by mouth daily. To be taken daily until INR therapeutic again and the re-adjust dose as needed      CONTINUE these medications which have NOT CHANGED   Details  acetaminophen (TYLENOL) 325 MG tablet Take 650 mg by mouth 2 (two) times daily.    atorvastatin (LIPITOR) 20 MG tablet Take 20 mg by mouth daily.    carbamide peroxide (DEBROX) 6.5 % otic solution Place 2 drops into both ears every Sunday. ADMINISTERED AT BEDTIME ONCE WEEKLY-SUNDAYS    digoxin (LANOXIN) 0.125 MG tablet Take 125 mcg by mouth daily. Takes after lunch    diltiazem (TIAZAC) 240 MG 24 hr capsule Take 240 mg by mouth daily.    gabapentin (NEURONTIN) 100 MG capsule Take 100 mg by mouth 2 (two) times daily.      levothyroxine (SYNTHROID, LEVOTHROID) 25 MCG tablet Take 25 mcg by mouth every evening.     Liniments (SALONPAS EX) Apply 1 application topically daily as needed (APPLIED TO KNEE).    nebivolol (BYSTOLIC) 10 MG tablet Take 10 mg by mouth every morning.     ondansetron (ZOFRAN) 4 MG tablet Take 4 mg by mouth 2 (two) times daily.    Probiotic Product (PROBIOTIC DAILY PO) Take 1 capsule by mouth daily.    Vitamin D, Ergocalciferol, (DRISDOL) 50000 units CAPS capsule Take 50,000 Units by mouth every 30 (thirty) days.       No Known Allergies Follow-up Information    Wende Neighbors, MD. Schedule an appointment as soon as possible for a visit in 10 day(s).   Specialty:  Internal Medicine Contact information: [place] [address] [phone]           The results of significant diagnostics from this hospitalization (including imaging, microbiology, ancillary and laboratory) are listed below for reference.    Significant Diagnostic Studies: Dg Chest 2 View  Result Date: 03/21/2016 CLINICAL DATA:  Shortness of breath, history of  left mastectomy for breast cancer EXAM: CHEST  2 VIEW COMPARISON:  03/17/2016 chest radiograph. FINDINGS: Stable cardiomediastinal silhouette with mild cardiomegaly. No pneumothorax. Small left pleural effusion, stable. Trace right pleural effusion, decreased. No overt pulmonary edema. Improved lung volumes. Patchy opacities at the left greater than right lung bases, significantly improved bilaterally. IMPRESSION: 1. Stable mild cardiomegaly without overt pulmonary edema. 2. Stable small left pleural effusion. Decreased trace right pleural effusion . 3. Improved lung volumes. Patchy bibasilar lung opacities, left greater than right, decreased bilaterally, favor atelectasis, cannot exclude aspiration or pneumonia. Electronically Signed   By: Ilona Sorrel M.D.   On: 03/21/2016 12:14   Dg Abd 1 View  Result Date: 03/18/2016 CLINICAL DATA:  Abdominal  distension EXAM: ABDOMEN - 1 VIEW COMPARISON:  04/12/2014 FINDINGS: Scattered large and small bowel gas is noted. No obstructive changes are seen. No free air is noted. Postsurgical changes in the left hip are again seen. Degenerative change of the lumbar spine is again noted. IMPRESSION: No acute abnormality noted. Electronically Signed   By: Inez Catalina M.D.   On: 03/18/2016 09:01   Dg Chest Portable 1 View  Result Date: 03/17/2016 CLINICAL DATA:  Acute onset of shortness of breath. Difficulty breathing. Decreased O2 saturation. Initial encounter. EXAM: PORTABLE CHEST 1 VIEW COMPARISON:  Chest radiograph performed 04/19/2014 FINDINGS: The lungs are well-aerated. Vascular congestion is noted. Small bilateral pleural effusions are seen. Increased interstitial markings raise concern for pulmonary edema. There is no evidence of pneumothorax. The cardiomediastinal silhouette is borderline normal in size. No acute osseous abnormalities are seen. IMPRESSION: Vascular congestion noted. Small bilateral pleural effusions seen. Increased interstitial markings raise concern for pulmonary edema. Electronically Signed   By: Garald Balding M.D.   On: 03/17/2016 21:18    Microbiology: Recent Results (from the past 240 hour(s))  MRSA PCR Screening     Status: None   Collection Time: 03/18/16  1:10 AM  Result Value Ref Range Status   MRSA by PCR NEGATIVE NEGATIVE Final    Comment:        The GeneXpert MRSA Assay (FDA approved for NASAL specimens only), is one component of a comprehensive MRSA colonization surveillance program. It is not intended to diagnose MRSA infection nor to guide or monitor treatment for MRSA infections.      Labs: Basic Metabolic Panel:  Recent Labs Lab 03/18/16 0054 03/18/16 0534 03/18/16 0842 03/19/16 0446 03/20/16 0453 03/21/16 0625  NA 132* 136 134* 138 132* 128*  K 3.9 3.7 3.5 3.6 3.1* 2.8*  CL 98* 99* 100* 101 90* 86*  CO2 28 28 28 28  33* 32  GLUCOSE 301* 246*  203* 214* 198* 166*  BUN 13 12 13 14 16  22*  CREATININE 0.56 0.68 0.67 0.61 0.78 0.78  CALCIUM 7.9* 8.0* 7.8* 8.2* 8.4* 8.1*  MG 1.7  --   --   --   --  1.7   CBC:  Recent Labs Lab 03/17/16 2005  WBC 12.4*  HGB 13.8  HCT 42.1  MCV 87.2  PLT 287   Cardiac Enzymes:  Recent Labs Lab 03/17/16 2005 03/18/16 0054 03/18/16 0534 03/18/16 1251  TROPONINI <0.03 <0.03 <0.03 <0.03   BNP: BNP (last 3 results)  Recent Labs  03/17/16 2005  BNP 483.0*   CBG:  Recent Labs Lab 03/20/16 1144 03/20/16 1609 03/20/16 2030 03/21/16 0737 03/21/16 1115  GLUCAP 359* 147* 264* 180* 307*    Signed:  Barton Dubois MD.  [facility] 03/21/2016, 1:32 PM

## 2016-03-21 NOTE — Progress Notes (Signed)
[place] for coumadin Indication: atrial fibrillation  No Known Allergies  Patient Measurements: Height: 5\' 3"  (160 cm) Weight: 114 lb 13.8 oz (52.1 kg) IBW/kg (Calculated) : 52.4  Vital Signs: Temp: 97.5 F (36.4 C) (08/10 0547) Temp Source: Oral (08/10 0547) BP: 134/98 (08/10 0827) Pulse Rate: 87 (08/10 0827)  Labs:  Recent Labs  03/18/16 1251 03/19/16 0446 03/19/16 1153 03/20/16 0453 03/21/16 0625  LABPROT  --   --  23.9* 21.7* 21.7*  INR  --   --  2.10 1.87 1.86  CREATININE  --  0.61  --  0.78 0.78  TROPONINI <0.03  --   --   --   --    Estimated Creatinine Clearance: 36.1 mL/min (by C-G formula based on SCr of 0.8 mg/dL).  Medical History: Past Medical History:  Diagnosis Date  . Arteriosclerotic cardiovascular disease (ASCVD)    cath in 2001- 40% LAD and 50% RCA; stent for 80% LAD in 5/04; residual 50% LAD, 80% small D1, 70% small OM1 50% ostial RCA, nl EF   . Atrial fibrillation ([place]) 2007   CHF with preserved LV systolic function - [id number]; moderate LVH  . Bronchitis    history  . Carcinoma of breast ([place])    left masectomy in 1995  . Chronic diastolic heart failure ([place]) 11/21/2009  . Chronic hoarseness   . CVD (cerebrovascular disease)    plaque w/o focal disease in 2006; h/o CVA  . Diabetes mellitus type II    no insulin   . Edema   . Herpes zoster   . Hypertension   . Hypothyroid   . Lower extremity edema 08/20/2011  . MVP (mitral valve prolapse)    moderate; with moderate MR  . Osteoporosis   . Peripheral neuropathy ([place])   . PVD (peripheral vascular disease) (HCC)    ABIs of 0.64 and 0.59, right and left leg in 2009  . 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 Shingles   . Ulcer    left lower leg  . Varicose veins of legs 08/20/2011   Medications:  Prescriptions Prior to Admission  Medication Sig Dispense Refill Last Dose  . acetaminophen (TYLENOL) 325 MG tablet Take 650 mg by mouth 2 (two) times daily.   03/17/2016 at  1400  . atorvastatin (LIPITOR) 20 MG tablet Take 20 mg by mouth daily.   03/17/2016 at 1700  . carbamide peroxide (DEBROX) 6.5 % otic solution Place 2 drops into both ears every Sunday. ADMINISTERED AT BEDTIME ONCE WEEKLY-SUNDAYS   Past Month at Unknown time  . digoxin (LANOXIN) 0.125 MG tablet Take 125 mcg by mouth daily. Takes after lunch   03/17/2016 at 1000  . diltiazem (TIAZAC) 240 MG 24 hr capsule Take 240 mg by mouth daily.   03/17/2016 at 1000  . furosemide (LASIX) 20 MG tablet Take 20 mg by mouth every morning.    03/17/2016 at 1000  . gabapentin (NEURONTIN) 100 MG capsule Take 100 mg by mouth 2 (two) times daily.    03/17/2016 at 1000  . levothyroxine (SYNTHROID, LEVOTHROID) 25 MCG tablet Take 25 mcg by mouth every evening.    03/16/2016 at 2100  . Liniments (SALONPAS EX) Apply 1 application topically daily as needed (APPLIED TO KNEE).   UNKNOWN  . nebivolol (BYSTOLIC) 10 MG tablet Take 10 mg by mouth every morning.    03/17/2016 at Unknown time  . ondansetron (ZOFRAN) 4 MG tablet Take 4 mg by mouth 2 (two) times daily.  03/17/2016 at 1800  . Probiotic Product (PROBIOTIC DAILY PO) Take 1 capsule by mouth daily.   03/17/2016 at [place]  . Vitamin D, Ergocalciferol, (DRISDOL) 50000 units CAPS capsule Take 50,000 Units by mouth every 30 (thirty) days.   03/13/2016 at 1000  . warfarin (COUMADIN) 2 MG tablet Take 2 mg by mouth every evening.    03/17/2016 at 1700   Assessment: 82 yo lady admitted with SOB to continue coumadin for afib.  INR below goal today. No bleeding reported   Goal of Therapy:  INR 2-3 Monitor platelets by anticoagulation protocol: Yes   Plan:  Repeat Coumadin 2.5 mg po today to boost INR. Daily PT/INR Monitor for bleeding complications  [name] 03/21/2016,12:14 PM

## 2016-03-21 NOTE — Progress Notes (Addendum)
Called report to [facility] nurse. EMS to pick up patent and transport her to [facility]. Patient's IVs removed and site intact.

## 2016-04-05 ENCOUNTER — Ambulatory Visit (INDEPENDENT_AMBULATORY_CARE_PROVIDER_SITE_OTHER): Payer: Medicare Other | Admitting: Internal Medicine

## 2016-04-05 ENCOUNTER — Encounter: Payer: Self-pay | Admitting: Internal Medicine

## 2016-04-05 VITALS — BP 136/64 | HR 61

## 2016-04-05 DIAGNOSIS — I5032 Chronic diastolic (congestive) heart failure: Secondary | ICD-10-CM

## 2016-04-05 DIAGNOSIS — I10 Essential (primary) hypertension: Secondary | ICD-10-CM | POA: Diagnosis not present

## 2016-04-05 DIAGNOSIS — I481 Persistent atrial fibrillation: Secondary | ICD-10-CM | POA: Diagnosis not present

## 2016-04-05 DIAGNOSIS — I4819 Other persistent atrial fibrillation: Secondary | ICD-10-CM

## 2016-04-05 NOTE — Patient Instructions (Signed)
Your physician recommends that you schedule a follow-up appointment in:  3 months (Nov/Dec)  Dr Harrington Challenger   Your physician recommends that you continue on your current medications as directed. Please refer to the Current Medication list given to you today.    Get BMET lab work,fax to Dr Harrington Challenger [phone]

## 2016-04-05 NOTE — Progress Notes (Signed)
Cardiology Office Note   Date:  04/05/2016   ID:  Larue, Defaria 1921-05-29, MRN [id number]  PCP:  Wende Neighbors, MD  Cardiologist:   Dorris Carnes, MD   F/U of atrial fib and diastolic CHF     History of Present Illness: [name] is a 82 y.o. female with a history of paroxysmal atrial fibrillation  I saw her in APril 2017  On coumadin  Also a history of HTN  When I saw her her BP was very high  Since seen she was admitted on 8/6 with acute CHF  Presented with edema  Rx Lasix  Dose adjusted to 40 bid  Echo showed LVEF 60 to 65%   PT's BP better at discharge  The pt says she has been breathing OK  No CP  No dizziness   Outpatient Medications Prior to Visit  Medication Sig Dispense Refill  . acetaminophen (TYLENOL) 325 MG tablet Take 650 mg by mouth 2 (two) times daily.    Marland Kitchen atorvastatin (LIPITOR) 20 MG tablet Take 20 mg by mouth daily.    . carbamide peroxide (DEBROX) 6.5 % otic solution Place 2 drops into both ears every Sunday. ADMINISTERED AT BEDTIME ONCE WEEKLY-SUNDAYS    . digoxin (LANOXIN) 0.125 MG tablet Take 125 mcg by mouth daily. Takes after lunch    . diltiazem (TIAZAC) 240 MG 24 hr capsule Take 240 mg by mouth daily.    . furosemide (LASIX) 20 MG tablet Take 2 tablets (40 mg total) by mouth 2 (two) times daily.    Marland Kitchen gabapentin (NEURONTIN) 100 MG capsule Take 100 mg by mouth 2 (two) times daily.     . isosorbide mononitrate (IMDUR) 30 MG 24 hr tablet Take 0.5 tablets (15 mg total) by mouth daily.    Marland Kitchen levothyroxine (SYNTHROID, LEVOTHROID) 25 MCG tablet Take 25 mcg by mouth every evening.     . Liniments (SALONPAS EX) Apply 1 application topically daily as needed (APPLIED TO KNEE).    Marland Kitchen lisinopril (PRINIVIL,ZESTRIL) 20 MG tablet Take 1 tablet (20 mg total) by mouth daily.    . nebivolol (BYSTOLIC) 10 MG tablet Take 10 mg by mouth every morning.     . ondansetron (ZOFRAN) 4 MG tablet Take 4 mg by mouth 2 (two) times daily.    . potassium chloride SA (K-DUR,KLOR-CON) 20 MEQ  tablet Take 2 tablets (40 mEq total) by mouth daily.    . Probiotic Product (PROBIOTIC DAILY PO) Take 1 capsule by mouth daily.    . Vitamin D, Ergocalciferol, (DRISDOL) 50000 units CAPS capsule Take 50,000 Units by mouth every 30 (thirty) days.    Marland Kitchen warfarin (COUMADIN) 2.5 MG tablet Take 1 tablet (2.5 mg total) by mouth daily. To be taken daily until INR therapeutic again and the re-adjust dose as needed     No facility-administered medications prior to visit.      Allergies:   Review of patient's allergies indicates no known allergies.   Past Medical History:  Diagnosis Date  . Arteriosclerotic cardiovascular disease (ASCVD)    cath in 2001- 40% LAD and 50% RCA; stent for 80% LAD in 5/04; residual 50% LAD, 80% small D1, 70% small OM1 50% ostial RCA, nl EF   . Atrial fibrillation ([place]) 2007   CHF with preserved LV systolic function - [id number]; moderate LVH  . Bronchitis    history  . Carcinoma of breast ([place])    left masectomy in 1995  . Chronic diastolic heart failure (  [place]) 11/21/2009  . Chronic hoarseness   . CVD (cerebrovascular disease)    plaque w/o focal disease in 2006; h/o CVA  . Diabetes mellitus type II    no insulin   . Edema   . Herpes zoster   . Hypertension   . Hypothyroid   . Lower extremity edema 08/20/2011  . MVP (mitral valve prolapse)    moderate; with moderate MR  . Osteoporosis   . Peripheral neuropathy ([place])   . PVD (peripheral vascular disease) (HCC)    ABIs of 0.64 and 0.59, right and left leg in 2009  . 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 Shingles   . Ulcer    left lower leg  . Varicose veins of legs 08/20/2011    Past Surgical History:  Procedure Laterality Date  . ABDOMINAL HYSTERECTOMY     abd?  . CATARACT EXTRACTION, BILATERAL    . CHOLECYSTECTOMY  2006  . COLONOSCOPY  Date unknown  . CORONARY ANGIOPLASTY WITH STENT PLACEMENT    . left masectomy  1995  . ORIF of left hip  05/22/06   [name]     Social History:  The patient  reports that she has  never smoked. She has never used smokeless tobacco. She reports that she does not drink alcohol or use drugs.   Family History:  The patient's family history includes Diabetes in her mother.    ROS:  Please see the history of present illness. All other systems are reviewed and  Negative to the above problem except as noted.    PHYSICAL EXAM: VS:  BP 136/64   Pulse 61   SpO2 97%   GEN: Well nourished, well developed, in no acute distress   Examined in wheelchair HEENT: normal  Neck: no JVD, carotid bruits, or masses Cardiac: RRR; no murmurs, rubs, or gallops,1+ edema  Respiratory:  clear to auscultation bilaterally, normal work of breathing GI: soft, nontender, nondistended, + BS  No hepatomegaly  MS: no deformity Moving all extremities   Skin: warm and dry, no rash Neuro:  Strength and sensation are intact Psych: euthymic mood, full affect   EKG:  EKG is not  ordered today.   Lipid Panel    Component Value Date/Time   CHOL  10/19/2010 0615    120        ATP III CLASSIFICATION:  <200     mg/dL   Desirable  200-239  mg/dL   Borderline High  >=240    mg/dL   High          TRIG 74 10/19/2010 0615   HDL 39 (L) 10/19/2010 0615   CHOLHDL 3.1 10/19/2010 0615   VLDL 15 10/19/2010 0615   LDLCALC  10/19/2010 0615    66        Total Cholesterol/HDL:CHD Risk Coronary Heart Disease Risk Table                     Men   Women  1/2 Average Risk   3.4   3.3  Average Risk       5.0   4.4  2 X Average Risk   9.6   7.1  3 X Average Risk  23.4   11.0        Use the calculated Patient Ratio above and the CHD Risk Table to determine the patient's CHD Risk.        ATP III CLASSIFICATION (LDL):  <100     mg/dL  Optimal  100-129  mg/dL   Near or Above                    Optimal  130-159  mg/dL   Borderline  160-189  mg/dL   High  >190     mg/dL   Very High      Wt Readings from Last 3 Encounters:  03/21/16 114 lb 13.8 oz (52.1 kg)  02/19/16 121 lb (54.9 kg)  12/04/15 124 lb  (56.2 kg)      ASSESSMENT AND PLAN: 1  Diastolic CHF  Volume status is not bad  I would keep on same regimen  Get BMET checked  Told pt to watch salt    2.  Atrial fib  Keep on current regimen including coumadin   3  HTN  BP is OK  No change in meds    F/U later this winter      Signed, Dorris Carnes, MD  04/05/2016 1:32 PM    [facility] [place], [place], [place]  [address] Phone: [phone]; Fax: [phone]

## 2016-04-16 ENCOUNTER — Telehealth: Payer: Self-pay | Admitting: Internal Medicine

## 2016-04-16 NOTE — Telephone Encounter (Signed)
Called [facility] and gave them the number to [employer], in hopes that they can connect her to Dr. Harrington Challenger quicker than we can . Receptionist stated she will pass the message along.

## 2016-04-18 ENCOUNTER — Telehealth: Payer: Self-pay | Admitting: Internal Medicine

## 2016-04-18 NOTE — Telephone Encounter (Signed)
Spoke to nurse at [facility] Pt's BP has been 130s to 160s to 180s   Would recomm 12.5 mg HCTZ added to regimen WIll forward to office to have Rx sent in

## 2016-04-19 MED ORDER — HYDROCHLOROTHIAZIDE 12.5 MG PO CAPS: 12.5000 mg | ORAL_CAPSULE | Freq: Every day | ORAL | 3 refills | Status: DC

## 2016-04-19 NOTE — Telephone Encounter (Signed)
E-scribed hctz as ordered

## 2016-04-19 NOTE — Telephone Encounter (Signed)
Nurse practitioner from [facility] concerning elevated blood pressure in this patient. Blood pressures running [id number] systolic/ systolic. The patient is awake alert and oriented and often states that she sometimes does not get her medications. This is been followed by the nurse practitioner there. Despite getting mediations blood pressure is becoming more elevated.I've advised her to go up on diltiazem to 300 mg daily.   The nurse practitioner also states that she sometimes can have some bradycardia but this is not sustained.  RESULT I will not go up on by systolic.   There is also a concern about Coumadin therapy. The patient hat she is not getting it regularly in fact did not have it for t She has been started on ELIQUIS 2.5 mg twice a day. Her last creatinine was 0.65. I advised him to increase her ELIQUIS 25 mg twice a day.  As result of all these changes I wanted her to be seen sooner than. December 2015 when follow-up is already scheduled. She will be seen in one month for ongoing evaluation of blood pressure and for labs. Labs are also being completed at [facility].

## 2016-04-29 ENCOUNTER — Ambulatory Visit (INDEPENDENT_AMBULATORY_CARE_PROVIDER_SITE_OTHER): Payer: Medicare Other | Admitting: Adult Health

## 2016-04-29 ENCOUNTER — Encounter: Payer: Self-pay | Admitting: Adult Health

## 2016-04-29 VITALS — BP 140/70 | HR 73 | Ht 62.0 in | Wt 116.0 lb

## 2016-04-29 DIAGNOSIS — I4821 Permanent atrial fibrillation: Secondary | ICD-10-CM

## 2016-04-29 DIAGNOSIS — I482 Chronic atrial fibrillation: Secondary | ICD-10-CM | POA: Diagnosis not present

## 2016-04-29 DIAGNOSIS — I10 Essential (primary) hypertension: Secondary | ICD-10-CM | POA: Diagnosis not present

## 2016-04-29 NOTE — Progress Notes (Signed)
Name: [name]    DOB: 1921-06-29  Age: 82 y.o.  MR#: [id number]       PCP:  Wende Neighbors, MD      Insurance: Payor: [employer] MEDICARE / Plan: [facility] MEDICARE / Product Type: *No Product type* /   CC:    Chief Complaint  Patient presents with  . Atrial Fibrillation  . Hypertension    VS Vitals:   04/29/16 1508  BP: 140/70  Pulse: 73  SpO2: 97%  Weight: 116 lb (52.6 kg)  Height: 5\' 2"  (1.575 m)    Weights Current Weight  04/29/16 116 lb (52.6 kg)  03/21/16 114 lb 13.8 oz (52.1 kg)  02/19/16 121 lb (54.9 kg)    Blood Pressure  BP Readings from Last 3 Encounters:  04/29/16 140/70  04/05/16 136/64  03/21/16 (!) 134/98     Admit date:  (Not on file) Last encounter with RMR:  Visit date not found   Allergy Review of patient's allergies indicates no known allergies.  Current Outpatient Prescriptions  Medication Sig Dispense Refill  . acetaminophen (TYLENOL) 325 MG tablet Take 650 mg by mouth 2 (two) times daily.    Marland Kitchen atorvastatin (LIPITOR) 20 MG tablet Take 20 mg by mouth daily.    . nebivolol (BYSTOLIC) 10 MG tablet Take 10 mg by mouth every morning.     . Vitamin D, Ergocalciferol, (DRISDOL) 50000 units CAPS capsule Take 50,000 Units by mouth every 30 (thirty) days.    . carbamide peroxide (DEBROX) 6.5 % otic solution Place 2 drops into both ears every Sunday. ADMINISTERED AT BEDTIME ONCE WEEKLY-SUNDAYS    . digoxin (LANOXIN) 0.125 MG tablet Take 125 mcg by mouth daily. Takes after lunch    . diltiazem (TIAZAC) 240 MG 24 hr capsule Take 240 mg by mouth daily.    Marland Kitchen ELIQUIS 5 MG TABS tablet     . furosemide (LASIX) 20 MG tablet Take 2 tablets (40 mg total) by mouth 2 (two) times daily.    Marland Kitchen gabapentin (NEURONTIN) 100 MG capsule Take 100 mg by mouth 2 (two) times daily.     . isosorbide mononitrate (IMDUR) 30 MG 24 hr tablet Take 0.5 tablets (15 mg total) by mouth daily.    Marland Kitchen levothyroxine (SYNTHROID, LEVOTHROID) 25 MCG tablet Take 25 mcg by mouth every evening.     .  Liniments (SALONPAS EX) Apply 1 application topically daily as needed (APPLIED TO KNEE).    Marland Kitchen lisinopril (PRINIVIL,ZESTRIL) 20 MG tablet Take 1 tablet (20 mg total) by mouth daily.    . ondansetron (ZOFRAN) 4 MG tablet Take 4 mg by mouth 2 (two) times daily.    . potassium chloride SA (K-DUR,KLOR-CON) 20 MEQ tablet Take 2 tablets (40 mEq total) by mouth daily. (Patient not taking: Reported on 04/05/2016)    . Probiotic Product (PROBIOTIC DAILY PO) Take 1 capsule by mouth daily.     No current facility-administered medications for this visit.     Discontinued Meds:    Medications Discontinued During This Encounter  Medication Reason  . warfarin (COUMADIN) 2.5 MG tablet Error  . hydrochlorothiazide (MICROZIDE) 12.5 MG capsule Error    Patient Active Problem List   Diagnosis Date Noted  . SOB (shortness of breath)   . Lewy body dementia without behavioral disturbance   . Acute respiratory failure with hypoxia ([place]) 03/18/2016  . Hyponatremia 03/18/2016  . Acute pulmonary edema ([place]) 03/17/2016  . Acute CHF (congestive heart failure) ([place]) 03/17/2016  . Abdominal  distention 04/13/2014  . Hip pain 04/12/2014  . Labral tear of hip, degenerative 04/12/2014  . Trochanteric bursitis of right hip 04/12/2014  . Encounter for therapeutic drug monitoring 10/06/2013  . Lower extremity edema 08/20/2011  . Arteriosclerotic cardiovascular disease (ASCVD)   . Carcinoma of breast ([place])   . Atrial fibrillation ([place])   . MVP (mitral valve prolapse)   . PVD (peripheral vascular disease) ([place])   . CVD (cerebrovascular disease)   . Thyroid activity decreased   . Osteoporosis   . Chronic anticoagulation 11/22/2010  . DIABETES MELLITUS, TYPE II 11/21/2009  . Hyperlipidemia 11/21/2009  . Hypertension 11/21/2009  . Acute on chronic diastolic heart failure ([place]) 11/21/2009  . SPINAL STENOSIS, LUMBAR 09/05/2008    LABS    Component Value Date/Time   NA 128 (L) 03/21/2016 0625   NA 132 (L)  03/20/2016 0453   NA 138 03/19/2016 0446   K 2.8 (L) 03/21/2016 0625   K 3.1 (L) 03/20/2016 0453   K 3.6 03/19/2016 0446   CL 86 (L) 03/21/2016 0625   CL 90 (L) 03/20/2016 0453   CL 101 03/19/2016 0446   CO2 32 03/21/2016 0625   CO2 33 (H) 03/20/2016 0453   CO2 28 03/19/2016 0446   GLUCOSE 166 (H) 03/21/2016 0625   GLUCOSE 198 (H) 03/20/2016 0453   GLUCOSE 214 (H) 03/19/2016 0446   BUN 22 (H) 03/21/2016 0625   BUN 16 03/20/2016 0453   BUN 14 03/19/2016 0446   CREATININE 0.78 03/21/2016 0625   CREATININE 0.78 03/20/2016 0453   CREATININE 0.61 03/19/2016 0446   CREATININE 0.79 03/18/2011 1510   CALCIUM 8.1 (L) 03/21/2016 0625   CALCIUM 8.4 (L) 03/20/2016 0453   CALCIUM 8.2 (L) 03/19/2016 0446   GFRNONAA >60 03/21/2016 0625   GFRNONAA >60 03/20/2016 0453   GFRNONAA >60 03/19/2016 0446   GFRAA >60 03/21/2016 0625   GFRAA >60 03/20/2016 0453   GFRAA >60 03/19/2016 0446   CMP     Component Value Date/Time   NA 128 (L) 03/21/2016 0625   K 2.8 (L) 03/21/2016 0625   CL 86 (L) 03/21/2016 0625   CO2 32 03/21/2016 0625   GLUCOSE 166 (H) 03/21/2016 0625   BUN 22 (H) 03/21/2016 0625   CREATININE 0.78 03/21/2016 0625   CREATININE 0.79 03/18/2011 1510   CALCIUM 8.1 (L) 03/21/2016 0625   PROT 6.1 04/19/2014 1709   ALBUMIN 3.0 (L) 04/19/2014 1709   AST 12 04/19/2014 1709   ALT 16 04/19/2014 1709   ALKPHOS 60 04/19/2014 1709   BILITOT 0.3 04/19/2014 1709   GFRNONAA >60 03/21/2016 0625   GFRAA >60 03/21/2016 0625       Component Value Date/Time   WBC 12.4 (H) 03/17/2016 2005   WBC 9.2 04/19/2014 1709   WBC 5.8 04/12/2014 0956   HGB 13.8 03/17/2016 2005   HGB 12.4 04/19/2014 1709   HGB 11.7 (L) 04/12/2014 0956   HCT 42.1 03/17/2016 2005   HCT 37.6 04/19/2014 1709   HCT 35.1 (L) 04/12/2014 0956   MCV 87.2 03/17/2016 2005   MCV 90.4 04/19/2014 1709   MCV 90.2 04/12/2014 0956    Lipid Panel     Component Value Date/Time   CHOL  10/19/2010 0615    120        ATP III  CLASSIFICATION:  <200     mg/dL   Desirable  200-239  mg/dL   Borderline High  >=240    mg/dL   High  TRIG 74 10/19/2010 0615   HDL 39 (L) 10/19/2010 0615   CHOLHDL 3.1 10/19/2010 0615   VLDL 15 10/19/2010 0615   LDLCALC  10/19/2010 0615    66        Total Cholesterol/HDL:CHD Risk Coronary Heart Disease Risk Table                     Men   Women  1/2 Average Risk   3.4   3.3  Average Risk       5.0   4.4  2 X Average Risk   9.6   7.1  3 X Average Risk  23.4   11.0        Use the calculated Patient Ratio above and the CHD Risk Table to determine the patient's CHD Risk.        ATP III CLASSIFICATION (LDL):  <100     mg/dL   Optimal  100-129  mg/dL   Near or Above                    Optimal  130-159  mg/dL   Borderline  160-189  mg/dL   High  >190     mg/dL   Very High    ABG No results found for: PHART, PCO2ART, PO2ART, HCO3, TCO2, ACIDBASEDEF, O2SAT   Lab Results  Component Value Date   TSH 1.800 04/12/2014   BNP (last 3 results)  Recent Labs  03/17/16 2005  BNP 483.0*    ProBNP (last 3 results) No results for input(s): PROBNP in the last 8760 hours.  Cardiac Panel (last 3 results) No results for input(s): CKTOTAL, CKMB, TROPONINI, RELINDX in the last 72 hours.  Iron/TIBC/Ferritin/ %Sat    Component Value Date/Time   FERRITIN 38 05/01/2007 1318     EKG Orders placed or performed during the hospital encounter of 03/17/16  . ED EKG  . ED EKG  . EKG 12-Lead  . EKG 12-Lead  . EKG     Prior Assessment and Plan Problem List as of 04/29/2016 Reviewed: 04/13/2014 12:48 PM by Radene Gunning, NP     Cardiovascular and Mediastinum   Hypertension   Last Assessment & Plan 04/07/2013 Office Visit Written 04/07/2013  7:35 PM by Yehuda Savannah, MD    Control of hypertension is inadequate. Dose of losartan will be increased 100 mg per day. Terazosin will be added to medical regime and titrated during continued monitoring of BP.      Acute on chronic  diastolic heart failure [facility])   Last Assessment & Plan 03/18/2011 Office Visit Written 03/18/2011  5:05 PM by Yehuda Savannah, MD    No apparent active congestive heart failure at the present time.      Arteriosclerotic cardiovascular disease (ASCVD)   Last Assessment & Plan 04/07/2013 Office Visit Edited 04/07/2013  7:34 PM by Yehuda Savannah, MD    No clinical evidence for progression of disease or significant ischemia for at least a decade; we will continue to attempt to optimize treatment of cardiovascular risk factors.    Rx with aspirin is not known to be beneficial in patients fully anticoagulated with warfarin; accordingly, aspirin will be discontinued.      Atrial fibrillation [facility])   Last Assessment & Plan 04/07/2013 Office Visit Written 04/07/2013  7:28 PM by Yehuda Savannah, MD    Patient is asymptomatic with respect to arrhythmia. Heart rate control is slightly excessive. Diltiazem dose will be decreased to  180 mg per day with next prescription.      MVP (mitral valve prolapse)   PVD (peripheral vascular disease) (HCC)   CVD (cerebrovascular disease)   Acute CHF (congestive heart failure) (HCC)     Respiratory   Acute pulmonary edema (HCC)   Acute respiratory failure with hypoxia (HCC)     Endocrine   DIABETES MELLITUS, TYPE II   Thyroid activity decreased     Nervous and Auditory   Lewy body dementia without behavioral disturbance     Musculoskeletal and Integument   Osteoporosis   Labral tear of hip, degenerative   Trochanteric bursitis of right hip     Other   Lower extremity edema   Hyperlipidemia   Last Assessment & Plan 04/07/2013 Office Visit Written 04/07/2013  7:35 PM by Yehuda Savannah, MD    She reports taking rosuvastatin only approximately 3 days per week, but this appears to be providing adequate control of hyperlipidemia based upon a recent profile.      SPINAL STENOSIS, LUMBAR   Last Assessment & Plan 03/18/2011 Office Visit Written 03/18/2011  5:13  PM by Yehuda Savannah, MD    Ms. Tyner has chronic back pain with left leg discomfort as well, which may be related to degenerative joint disease of the lumbosacral spine.  She's been taking Tylenol without much relief.  She is worried about taking nonsteroidals in the setting of full anticoagulation.  I provided her with a prescription for Ultracet.  If this is ineffective, I would encourage her to use nonsteroidals at the lowest effective dose once or twice a day.      Chronic anticoagulation   Last Assessment & Plan 04/07/2013 Office Visit Edited 04/07/2013  7:30 PM by Yehuda Savannah, MD    She has done remarkably well with chronic anticoagulation.  Consideration can be given to substitution of a novel oral anticoagulant, but in consideration of her uncomplicated therapy with warfarin to date, we will continue use of this agent and include careful monitoring of the level of anticoagulation, serial CBCs and stool Hemoccult testing.      Carcinoma of breast ([place])   Encounter for therapeutic drug monitoring   Hip pain   Abdominal distention   Hyponatremia   SOB (shortness of breath)       Imaging: No results found.

## 2016-04-29 NOTE — Progress Notes (Signed)
Cardiology Office Note   Date:  04/29/2016   ID:  [name], DOB 06-09-1921, MRN [id number]  PCP:  Wende Neighbors, MD  Cardiologist: Ross/  Jory Sims, NP   Chief Complaint  Patient presents with  . Atrial Fibrillation  . Hypertension      History of Present Illness: [name] is a 82 y.o. female who presents for ongoing assessment and management of paroxysmal atrial fibrillation on Coumadin therapy, hypertension, and diastolic CHF. She was last seen by Dr. Harrington Challenger on 04/05/2016. The patient was advised to watch salt, a follow-up BMET was ordered, she was Placed on ELIQUIS 5 mg twice a day and taken off of Coumadin therapy., with no other change in medications.  She is without complaint. No excessive bruising, melena, hemoptysis. She continues to have issues with frequent urination and incontinence which are not new for her. There are no new complaints on this office visit. She is a resident of [facility].   Past Medical History:  Diagnosis Date  . Arteriosclerotic cardiovascular disease (ASCVD)    cath in 2001- 40% LAD and 50% RCA; stent for 80% LAD in 5/04; residual 50% LAD, 80% small D1, 70% small OM1 50% ostial RCA, nl EF   . Atrial fibrillation ([place]) 2007   CHF with preserved LV systolic function - [id number]; moderate LVH  . Bronchitis    history  . Carcinoma of breast ([place])    left masectomy in 1995  . Chronic diastolic heart failure ([place]) 11/21/2009  . Chronic hoarseness   . CVD (cerebrovascular disease)    plaque w/o focal disease in 2006; h/o CVA  . Diabetes mellitus type II    no insulin   . Edema   . Herpes zoster   . Hypertension   . Hypothyroid   . Lower extremity edema 08/20/2011  . MVP (mitral valve prolapse)    moderate; with moderate MR  . Osteoporosis   . Peripheral neuropathy ([place])   . PVD (peripheral vascular disease) (HCC)    ABIs of 0.64 and 0.59, right and left leg in 2009  . 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 Shingles   . Ulcer    left lower leg  .  Varicose veins of legs 08/20/2011    Past Surgical History:  Procedure Laterality Date  . ABDOMINAL HYSTERECTOMY     abd?  . CATARACT EXTRACTION, BILATERAL    . CHOLECYSTECTOMY  2006  . COLONOSCOPY  Date unknown  . CORONARY ANGIOPLASTY WITH STENT PLACEMENT    . left masectomy  1995  . ORIF of left hip  05/22/06   [name]     Current Outpatient Prescriptions  Medication Sig Dispense Refill  . acetaminophen (TYLENOL) 325 MG tablet Take 650 mg by mouth 2 (two) times daily.    Marland Kitchen atorvastatin (LIPITOR) 20 MG tablet Take 20 mg by mouth daily.    . carbamide peroxide (DEBROX) 6.5 % otic solution Place 2 drops into both ears every Sunday. ADMINISTERED AT BEDTIME ONCE WEEKLY-SUNDAYS    . digoxin (LANOXIN) 0.125 MG tablet Take 125 mcg by mouth daily. Takes after lunch    . diltiazem (TIAZAC) 240 MG 24 hr capsule Take 240 mg by mouth daily.    Marland Kitchen ELIQUIS 5 MG TABS tablet Take 5 mg by mouth 2 (two) times daily.     . furosemide (LASIX) 20 MG tablet Take 2 tablets (40 mg total) by mouth 2 (two) times daily.    Marland Kitchen gabapentin (NEURONTIN) 100 MG capsule  Take 100 mg by mouth 2 (two) times daily.     . isosorbide mononitrate (IMDUR) 30 MG 24 hr tablet Take 0.5 tablets (15 mg total) by mouth daily.    Marland Kitchen levothyroxine (SYNTHROID, LEVOTHROID) 25 MCG tablet Take 25 mcg by mouth every evening.     . Liniments (SALONPAS EX) Apply 1 application topically daily as needed (APPLIED TO KNEE).    Marland Kitchen lisinopril (PRINIVIL,ZESTRIL) 20 MG tablet Take 1 tablet (20 mg total) by mouth daily.    . nebivolol (BYSTOLIC) 10 MG tablet Take 10 mg by mouth every morning.     . ondansetron (ZOFRAN) 4 MG tablet Take 4 mg by mouth 2 (two) times daily.    . potassium chloride SA (K-DUR,KLOR-CON) 20 MEQ tablet Take 2 tablets (40 mEq total) by mouth daily.    . Probiotic Product (PROBIOTIC DAILY PO) Take 1 capsule by mouth daily.    . Vitamin D, Ergocalciferol, (DRISDOL) 50000 units CAPS capsule Take 50,000 Units by mouth every 30  (thirty) days.     No current facility-administered medications for this visit.     Allergies:   Review of patient's allergies indicates no known allergies.    Social History:  The patient  reports that she has never smoked. She has never used smokeless tobacco. She reports that she does not drink alcohol or use drugs.   Family History:  The patient's family history includes Diabetes in her mother.    ROS: All other systems are reviewed and negative. Unless otherwise mentioned in H&P    PHYSICAL EXAM: VS:  BP 140/70   Pulse 73   Ht 5\' 2"  (1.575 m)   Wt 116 lb (52.6 kg) Comment: per pt  SpO2 97%   BMI 21.22 kg/m  , BMI Body mass index is 21.22 kg/m. GEN: Well nourished, well developed, in no acute distress  HEENT: normal  Neck: no JVD, carotid bruits, or masses Cardiac: IRRR; no murmurs, rubs, or gallops,no edema  Respiratory:  Clear to auscultation bilaterally, normal work of breathing GI: soft, nontender, nondistended, + BS MS: Scoliosis is noted. Skin: warm and dry, no rash Neuro:  Strength and sensation are intact Psych: euthymic mood, full affect   Recent Labs: 03/17/2016: B Natriuretic Peptide 483.0; Hemoglobin 13.8; Platelets 287 03/21/2016: BUN 22; Creatinine, Ser 0.78; Magnesium 1.7; Potassium 2.8; Sodium 128    Lipid Panel    Component Value Date/Time   CHOL  10/19/2010 0615    120        ATP III CLASSIFICATION:  <200     mg/dL   Desirable  200-239  mg/dL   Borderline High  >=240    mg/dL   High          TRIG 74 10/19/2010 0615   HDL 39 (L) 10/19/2010 0615   CHOLHDL 3.1 10/19/2010 0615   VLDL 15 10/19/2010 0615   LDLCALC  10/19/2010 0615    66        Total Cholesterol/HDL:CHD Risk Coronary Heart Disease Risk Table                     Men   Women  1/2 Average Risk   3.4   3.3  Average Risk       5.0   4.4  2 X Average Risk   9.6   7.1  3 X Average Risk  23.4   11.0        Use the calculated Patient Ratio above and  the CHD Risk Table to  determine the patient's CHD Risk.        ATP III CLASSIFICATION (LDL):  <100     mg/dL   Optimal  100-129  mg/dL   Near or Above                    Optimal  130-159  mg/dL   Borderline  160-189  mg/dL   High  >190     mg/dL   Very High      Wt Readings from Last 3 Encounters:  04/29/16 116 lb (52.6 kg)  03/21/16 114 lb 13.8 oz (52.1 kg)  02/19/16 121 lb (54.9 kg)     ASSESSMENT AND PLAN:  1. Atrial fibrillation: Heart rate is well controlled currently. She will remain on diltiazem 240 mg daily and digoxin, along with ELIQUIS 5 mg twice a day. She offers no complaints of rapid heart rhythm bleeding or excessive bruising. Will order CBC.  2. Hypertension: Blood pressure is currently well-controlled. Her only complaint is frequent urination from diuretics.  3. Chronic Diastolic CHF: No evidence of decompensation currently.   Current medicines are reviewed at length with the patient today.    Labs/ tests ordered today include: 6 months No orders of the defined types were placed in this encounter.    Disposition:   FU with 6 months.    Signed, Jory Sims, NP  04/29/2016 3:34 PM    [facility] [address], [place], [place] [address] Phone: [phone]; Fax: [phone]

## 2016-04-29 NOTE — Patient Instructions (Signed)
Your physician wants you to follow-up in: 6 Month with Dr. Harrington Challenger.  You will receive a reminder letter in the mail two months in advance. If you don't receive a letter, please call our office to schedule the follow-up appointment.  Your physician recommends that you continue on your current medications as directed. Please refer to the Current Medication list given to you today.  If you need a refill on your cardiac medications before your next appointment, please call your pharmacy.  Thank you for choosing [facility]!

## 2016-08-08 ENCOUNTER — Encounter: Payer: Self-pay | Admitting: Internal Medicine

## 2016-08-08 ENCOUNTER — Ambulatory Visit (INDEPENDENT_AMBULATORY_CARE_PROVIDER_SITE_OTHER): Payer: Medicare Other | Admitting: Internal Medicine

## 2016-08-08 VITALS — BP 130/64 | HR 73 | Ht 62.0 in | Wt 117.0 lb

## 2016-08-08 DIAGNOSIS — I480 Paroxysmal atrial fibrillation: Secondary | ICD-10-CM | POA: Diagnosis not present

## 2016-08-08 DIAGNOSIS — I10 Essential (primary) hypertension: Secondary | ICD-10-CM

## 2016-08-08 DIAGNOSIS — I5032 Chronic diastolic (congestive) heart failure: Secondary | ICD-10-CM

## 2016-08-08 NOTE — Progress Notes (Signed)
Cardiology Office Note   Date:  08/08/2016   ID:  Geanine, Kurtzer 27-Jun-1921, MRN [id number]  PCP:  Wende Neighbors, MD  Cardiologist:   Dorris Carnes, MD   F/U of PAF and diastolic CHF      History of Present Illness: [name] is a 82 y.o. female with a history of PAF  I saw her in [place] 2017  Also history of HTN  She was admitted in Aug for CHF  Lasix adjusted  Echo LVEF 60 to 65%  Since seen she says she was sick all Christmas  COugh  She denies CP  Breathing is stable  Has some LE edema  Sore on R second toe    Current Meds  Medication Sig  . acetaminophen (TYLENOL) 325 MG tablet Take 650 mg by mouth 2 (two) times daily.  Marland Kitchen atorvastatin (LIPITOR) 20 MG tablet Take 20 mg by mouth daily.  . carbamide peroxide (DEBROX) 6.5 % otic solution Place 2 drops into both ears every Sunday. ADMINISTERED AT BEDTIME ONCE WEEKLY-SUNDAYS  . digoxin (LANOXIN) 0.125 MG tablet Take 125 mcg by mouth daily. Takes after lunch  . diltiazem (TIAZAC) 240 MG 24 hr capsule Take 240 mg by mouth daily.  Marland Kitchen ELIQUIS 5 MG TABS tablet Take 5 mg by mouth 2 (two) times daily.   . furosemide (LASIX) 20 MG tablet Take 2 tablets (40 mg total) by mouth 2 (two) times daily.  Marland Kitchen gabapentin (NEURONTIN) 100 MG capsule Take 100 mg by mouth 2 (two) times daily.   . isosorbide mononitrate (IMDUR) 30 MG 24 hr tablet Take 0.5 tablets (15 mg total) by mouth daily.  Marland Kitchen levothyroxine (SYNTHROID, LEVOTHROID) 25 MCG tablet Take 25 mcg by mouth every evening.   . Liniments (SALONPAS EX) Apply 1 application topically daily as needed (APPLIED TO KNEE).  Marland Kitchen lisinopril (PRINIVIL,ZESTRIL) 20 MG tablet Take 1 tablet (20 mg total) by mouth daily.  . nebivolol (BYSTOLIC) 10 MG tablet Take 10 mg by mouth every morning.   . ondansetron (ZOFRAN) 4 MG tablet Take 4 mg by mouth 2 (two) times daily.  . potassium chloride SA (K-DUR,KLOR-CON) 20 MEQ tablet Take 2 tablets (40 mEq total) by mouth daily.  . Probiotic Product (PROBIOTIC DAILY PO) Take  1 capsule by mouth daily.  . Vitamin D, Ergocalciferol, (DRISDOL) 50000 units CAPS capsule Take 50,000 Units by mouth every 30 (thirty) days.     Allergies:   Patient has no known allergies.   Past Medical History:  Diagnosis Date  . Arteriosclerotic cardiovascular disease (ASCVD)    cath in 2001- 40% LAD and 50% RCA; stent for 80% LAD in 5/04; residual 50% LAD, 80% small D1, 70% small OM1 50% ostial RCA, nl EF   . Atrial fibrillation ([place]) 2007   CHF with preserved LV systolic function - [id number]; moderate LVH  . Bronchitis    history  . Carcinoma of breast ([place])    left masectomy in 1995  . Chronic diastolic heart failure ([place]) 11/21/2009  . Chronic hoarseness   . CVD (cerebrovascular disease)    plaque w/o focal disease in 2006; h/o CVA  . Diabetes mellitus type II    no insulin   . Edema   . Herpes zoster   . Hypertension   . Hypothyroid   . Lower extremity edema 08/20/2011  . MVP (mitral valve prolapse)    moderate; with moderate MR  . Osteoporosis   . Peripheral neuropathy ([place])   . PVD (  peripheral vascular disease) (H[facility])    ABIs of 0.64 and 0.59, right and left leg in 2009  . 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 Shingles   . Ulcer ([place])    left lower leg  . Varicose veins of legs 08/20/2011    Past Surgical History:  Procedure Laterality Date  . ABDOMINAL HYSTERECTOMY     abd?  . CATARACT EXTRACTION, BILATERAL    . CHOLECYSTECTOMY  2006  . COLONOSCOPY  Date unknown  . CORONARY ANGIOPLASTY WITH STENT PLACEMENT    . left masectomy  1995  . ORIF of left hip  05/22/06   [name]     Social History:  The patient  reports that she has never smoked. She has never used smokeless tobacco. She reports that she does not drink alcohol or use drugs.   Family History:  The patient's family history includes Diabetes in her mother.    ROS:  Please see the history of present illness. All other systems are reviewed and  Negative to the above problem except as noted.    PHYSICAL  EXAM: VS:  BP 130/64   Pulse 73   Ht 5\' 2"  (1.575 m)   Wt 117 lb (53.1 kg)   SpO2 98%   BMI 21.40 kg/m   GEN: Well nourished, well developed, in no acute distress  HEENT: normal  Neck: JVP is mildly increased  , carotid bruits, or masses Cardiac: RRR; no murmurs, rubs, or gallops,Tr to 1+ edema  Respiratory:  MIld rhonchi   Mild rales at bases   GI: soft, nontender, nondistended, + BS  No hepatomegaly  MS: no deformity Moving all extremities   Skin: warm and dry, no rash Neuro:  Strength and sensation are intact Psych: euthymic mood, full affect   EKG:  EKG is notordered today.   Lipid Panel    Component Value Date/Time   CHOL  10/19/2010 0615    120        ATP III CLASSIFICATION:  <200     mg/dL   Desirable  200-239  mg/dL   Borderline High  >=240    mg/dL   High          TRIG 74 10/19/2010 0615   HDL 39 (L) 10/19/2010 0615   CHOLHDL 3.1 10/19/2010 0615   VLDL 15 10/19/2010 0615   LDLCALC  10/19/2010 0615    66        Total Cholesterol/HDL:CHD Risk Coronary Heart Disease Risk Table                     Men   Women  1/2 Average Risk   3.4   3.3  Average Risk       5.0   4.4  2 X Average Risk   9.6   7.1  3 X Average Risk  23.4   11.0        Use the calculated Patient Ratio above and the CHD Risk Table to determine the patient's CHD Risk.        ATP III CLASSIFICATION (LDL):  <100     mg/dL   Optimal  100-129  mg/dL   Near or Above                    Optimal  130-159  mg/dL   Borderline  160-189  mg/dL   High  >190     mg/dL   Very High      Wt Readings  from Last 3 Encounters:  08/08/16 117 lb (53.1 kg)  04/29/16 116 lb (52.6 kg)  03/21/16 114 lb 13.8 oz (52.1 kg)      ASSESSMENT AND PLAN: 1  Diastolic CHF  Volume appears mildly incrased  Would have labs (CBC, BMET, TSH and BNP )drawn  Will review and notify pt on what to do with meds Watch salt and fluid intake  (2 to 3 gr; 1800  Cc)  2  Atrail fib  Paroxysmal  Keep on current regimen    3  HTN   Keep on current regimen  F/U in the spring     Current medicines are reviewed at length with the patient today.  The patient does not have concerns regarding medicines.  Signed, Dorris Carnes, MD  08/08/2016 2:51 PM    [facility] [place], [place], [place]  [address] Phone: [phone]; Fax: [phone]

## 2016-08-08 NOTE — Patient Instructions (Signed)
Your physician recommends that you schedule a follow-up appointment in: March 2018 with Arnold Long NP    See Physicians Order Sheet      Thank you for choosing [facility] !

## 2016-09-27 ENCOUNTER — Emergency Department (HOSPITAL_COMMUNITY): Payer: Medicare Other

## 2016-09-27 ENCOUNTER — Inpatient Hospital Stay (HOSPITAL_COMMUNITY)
Admission: EM | Admit: 2016-09-27 | Discharge: 2016-09-30 | DRG: 291 | Disposition: A | Discharge status: Skilled Nursing Facility | Payer: Medicare Other | Attending: Internal Medicine | Admitting: Internal Medicine

## 2016-09-27 ENCOUNTER — Encounter (HOSPITAL_COMMUNITY): Payer: Self-pay | Admitting: Internal Medicine

## 2016-09-27 DIAGNOSIS — I341 Nonrheumatic mitral (valve) prolapse: Secondary | ICD-10-CM | POA: Diagnosis present

## 2016-09-27 DIAGNOSIS — Z8673 Personal history of transient ischemic attack (TIA), and cerebral infarction without residual deficits: Secondary | ICD-10-CM

## 2016-09-27 DIAGNOSIS — I482 Chronic atrial fibrillation: Secondary | ICD-10-CM | POA: Diagnosis present

## 2016-09-27 DIAGNOSIS — I5032 Chronic diastolic (congestive) heart failure: Secondary | ICD-10-CM | POA: Diagnosis present

## 2016-09-27 DIAGNOSIS — Z853 Personal history of malignant neoplasm of breast: Secondary | ICD-10-CM

## 2016-09-27 DIAGNOSIS — I4891 Unspecified atrial fibrillation: Secondary | ICD-10-CM | POA: Diagnosis present

## 2016-09-27 DIAGNOSIS — Z833 Family history of diabetes mellitus: Secondary | ICD-10-CM | POA: Diagnosis not present

## 2016-09-27 DIAGNOSIS — I110 Hypertensive heart disease with heart failure: Principal | ICD-10-CM | POA: Diagnosis present

## 2016-09-27 DIAGNOSIS — R001 Bradycardia, unspecified: Secondary | ICD-10-CM | POA: Diagnosis present

## 2016-09-27 DIAGNOSIS — Z7901 Long term (current) use of anticoagulants: Secondary | ICD-10-CM | POA: Diagnosis not present

## 2016-09-27 DIAGNOSIS — I481 Persistent atrial fibrillation: Secondary | ICD-10-CM | POA: Diagnosis not present

## 2016-09-27 DIAGNOSIS — E1151 Type 2 diabetes mellitus with diabetic peripheral angiopathy without gangrene: Secondary | ICD-10-CM | POA: Diagnosis present

## 2016-09-27 DIAGNOSIS — I10 Essential (primary) hypertension: Secondary | ICD-10-CM | POA: Diagnosis present

## 2016-09-27 DIAGNOSIS — I2510 Atherosclerotic heart disease of native coronary artery without angina pectoris: Secondary | ICD-10-CM | POA: Diagnosis present

## 2016-09-27 DIAGNOSIS — I509 Heart failure, unspecified: Secondary | ICD-10-CM

## 2016-09-27 DIAGNOSIS — E871 Hypo-osmolality and hyponatremia: Secondary | ICD-10-CM | POA: Diagnosis present

## 2016-09-27 DIAGNOSIS — E039 Hypothyroidism, unspecified: Secondary | ICD-10-CM | POA: Diagnosis present

## 2016-09-27 DIAGNOSIS — I501 Left ventricular failure: Secondary | ICD-10-CM | POA: Diagnosis present

## 2016-09-27 DIAGNOSIS — Z79899 Other long term (current) drug therapy: Secondary | ICD-10-CM | POA: Diagnosis not present

## 2016-09-27 DIAGNOSIS — E1142 Type 2 diabetes mellitus with diabetic polyneuropathy: Secondary | ICD-10-CM | POA: Diagnosis present

## 2016-09-27 DIAGNOSIS — Z955 Presence of coronary angioplasty implant and graft: Secondary | ICD-10-CM | POA: Diagnosis not present

## 2016-09-27 DIAGNOSIS — M810 Age-related osteoporosis without current pathological fracture: Secondary | ICD-10-CM | POA: Diagnosis present

## 2016-09-27 DIAGNOSIS — E119 Type 2 diabetes mellitus without complications: Secondary | ICD-10-CM

## 2016-09-27 DIAGNOSIS — R140 Abdominal distension (gaseous): Secondary | ICD-10-CM | POA: Diagnosis present

## 2016-09-27 DIAGNOSIS — J9601 Acute respiratory failure with hypoxia: Secondary | ICD-10-CM | POA: Diagnosis present

## 2016-09-27 DIAGNOSIS — E785 Hyperlipidemia, unspecified: Secondary | ICD-10-CM | POA: Diagnosis present

## 2016-09-27 DIAGNOSIS — R0602 Shortness of breath: Secondary | ICD-10-CM

## 2016-09-27 LAB — BRAIN NATRIURETIC PEPTIDE: B Natriuretic Peptide: 523 pg/mL — ABNORMAL HIGH (ref 0.0–100.0)

## 2016-09-27 LAB — BASIC METABOLIC PANEL
Anion gap: 9 (ref 5–15)
BUN: 23 mg/dL — AB (ref 6–20)
CHLORIDE: 96 mmol/L — AB (ref 101–111)
CO2: 24 mmol/L (ref 22–32)
CREATININE: 0.82 mg/dL (ref 0.44–1.00)
Calcium: 9.5 mg/dL (ref 8.9–10.3)
GFR calc Af Amer: >60 mL/min (ref >60)
GFR calc non Af Amer: 60 mL/min — ABNORMAL LOW (ref >60)
Glucose, Bld: 303 mg/dL — ABNORMAL HIGH (ref 65–99)
Potassium: 5 mmol/L (ref 3.5–5.1)
SODIUM: 129 mmol/L — AB (ref 135–145)

## 2016-09-27 LAB — GLUCOSE, CAPILLARY: Glucose-Capillary: 284 mg/dL — ABNORMAL HIGH (ref 65–99)

## 2016-09-27 LAB — PHOSPHORUS: PHOSPHORUS: 4.5 mg/dL (ref 2.5–4.6)

## 2016-09-27 LAB — CBC WITH DIFFERENTIAL/PLATELET
Basophils Absolute: 0 10*3/uL (ref 0.0–0.1)
Basophils Relative: 0 %
EOS ABS: 0.2 10*3/uL (ref 0.0–0.7)
EOS PCT: 1 %
HCT: 44 % (ref 36.0–46.0)
Hemoglobin: 14.1 g/dL (ref 12.0–15.0)
LYMPHS ABS: 1.4 10*3/uL (ref 0.7–4.0)
Lymphocytes Relative: 12 %
MCH: 27 pg (ref 26.0–34.0)
MCHC: 32 g/dL (ref 30.0–36.0)
MCV: 84.1 fL (ref 78.0–100.0)
MONOS PCT: 6 %
Monocytes Absolute: 0.6 10*3/uL (ref 0.1–1.0)
Neutro Abs: 8.9 10*3/uL — ABNORMAL HIGH (ref 1.7–7.7)
Neutrophils Relative %: 81 %
Platelets: 309 10*3/uL (ref 150–400)
RBC: 5.23 MIL/uL — ABNORMAL HIGH (ref 3.87–5.11)
RDW: 17.3 % — AB (ref 11.5–15.5)
WBC: 11.1 10*3/uL — AB (ref 4.0–10.5)

## 2016-09-27 LAB — DIGOXIN LEVEL: Digoxin Level: 1.6 ng/mL (ref 0.8–2.0)

## 2016-09-27 LAB — TROPONIN I

## 2016-09-27 LAB — MAGNESIUM: Magnesium: 2 mg/dL (ref 1.7–2.4)

## 2016-09-27 MED ORDER — LEVOTHYROXINE SODIUM 25 MCG PO TABS
25.0000 ug | ORAL_TABLET | Freq: Every day | ORAL | Status: DC
  Administered 2016-09-28 – 2016-09-30 (×3): 25 ug via ORAL
  Filled 2016-09-27 (×3): qty 1

## 2016-09-27 MED ORDER — POTASSIUM CHLORIDE CRYS ER 20 MEQ PO TBCR
40.0000 meq | EXTENDED_RELEASE_TABLET | Freq: Every day | ORAL | Status: DC
  Administered 2016-09-28 – 2016-09-30 (×3): 40 meq via ORAL
  Filled 2016-09-27 (×3): qty 2

## 2016-09-27 MED ORDER — NITROGLYCERIN 2 % TD OINT
1.0000 [in_us] | TOPICAL_OINTMENT | Freq: Once | TRANSDERMAL | Status: DC
  Filled 2016-09-27: qty 1

## 2016-09-27 MED ORDER — IPRATROPIUM-ALBUTEROL 0.5-2.5 (3) MG/3ML IN SOLN
3.0000 mL | Freq: Four times a day (QID) | RESPIRATORY_TRACT | Status: DC
  Administered 2016-09-27 – 2016-09-28 (×5): 3 mL via RESPIRATORY_TRACT
  Filled 2016-09-27 (×6): qty 3

## 2016-09-27 MED ORDER — ATORVASTATIN CALCIUM 20 MG PO TABS
20.0000 mg | ORAL_TABLET | Freq: Every day | ORAL | Status: DC
  Administered 2016-09-28 – 2016-09-29 (×2): 20 mg via ORAL
  Filled 2016-09-27 (×2): qty 1

## 2016-09-27 MED ORDER — FUROSEMIDE 10 MG/ML IJ SOLN
40.0000 mg | Freq: Once | INTRAMUSCULAR | Status: AC
  Administered 2016-09-27: 40 mg via INTRAVENOUS
  Filled 2016-09-27: qty 4

## 2016-09-27 MED ORDER — ACETAMINOPHEN 325 MG PO TABS
650.0000 mg | ORAL_TABLET | Freq: Two times a day (BID) | ORAL | Status: DC
  Administered 2016-09-27 – 2016-09-30 (×6): 650 mg via ORAL
  Filled 2016-09-27 (×6): qty 2

## 2016-09-27 MED ORDER — INSULIN ASPART 100 UNIT/ML ~~LOC~~ SOLN
0.0000 [IU] | Freq: Three times a day (TID) | SUBCUTANEOUS | Status: DC
  Administered 2016-09-28: 2 [IU] via SUBCUTANEOUS
  Administered 2016-09-28 – 2016-09-29 (×2): 1 [IU] via SUBCUTANEOUS

## 2016-09-27 MED ORDER — APIXABAN 5 MG PO TABS
5.0000 mg | ORAL_TABLET | Freq: Two times a day (BID) | ORAL | Status: DC
  Administered 2016-09-27 – 2016-09-30 (×6): 5 mg via ORAL
  Filled 2016-09-27 (×6): qty 1

## 2016-09-27 MED ORDER — FUROSEMIDE 10 MG/ML IJ SOLN: 40.0000 mg | Freq: Two times a day (BID) | INTRAMUSCULAR | Status: DC

## 2016-09-27 MED ORDER — ONDANSETRON HCL 4 MG PO TABS
4.0000 mg | ORAL_TABLET | Freq: Two times a day (BID) | ORAL | Status: DC
  Administered 2016-09-27 – 2016-09-30 (×6): 4 mg via ORAL
  Filled 2016-09-27 (×6): qty 1

## 2016-09-27 MED ORDER — LISINOPRIL 10 MG PO TABS
20.0000 mg | ORAL_TABLET | Freq: Every day | ORAL | Status: DC
  Administered 2016-09-28 – 2016-09-30 (×3): 20 mg via ORAL
  Filled 2016-09-27 (×3): qty 2

## 2016-09-27 MED ORDER — GABAPENTIN 100 MG PO CAPS
100.0000 mg | ORAL_CAPSULE | Freq: Two times a day (BID) | ORAL | Status: DC
  Administered 2016-09-27 – 2016-09-30 (×6): 100 mg via ORAL
  Filled 2016-09-27 (×6): qty 1

## 2016-09-27 MED ORDER — NITROGLYCERIN 2 % TD OINT
1.0000 [in_us] | TOPICAL_OINTMENT | Freq: Once | TRANSDERMAL | Status: AC
  Administered 2016-09-27: 1 [in_us] via TOPICAL
  Filled 2016-09-27: qty 1

## 2016-09-27 NOTE — ED Notes (Signed)
ED Provider at bedside. 

## 2016-09-27 NOTE — H&P (Addendum)
History and Physical    [name] [id number] DOB: 20-Oct-1920 DOA: 09/27/2016  PCP: Wende Neighbors, MD   Patient coming from: NH.  Chief Complaint: Shortness of breath.  HPI: [name] is a 83 y.o. female with medical history significant of CAD, chronic atrial fibrillation, CHF with preserved LV function, cerebrovascular disease, peripheral vascular disease, mitral valve prolapse, breast cancer, chronic hoarseness,  type 2 diabetes, herpes zoster, hypertension, hypothyroidism, osteoporosis, diabetic peripheral neuropathy, osteoarthritis who was brought by EMS from her nursing home facility to the emergency department due to severe dyspnea associated with productive cough of whitish sputum. EMS had to place patient on supplemental oxygen with nonrebreathing mask during transport to the emergency department.  Per patient and her son, she has been feeling progressively weaker and tired over the past few days. She has had significant lower extremity edema over the past 2 weeks and her son stated that several days ago "she was leaking fluid from her legs". She has also had orthopnea. She denies noncompliance of medications, high fluid intake or sodium consumption indiscretion. She denies fever, chills, sore throat, chest pain, palpitations, diaphoresis, dizziness, abdominal pain, nausea, emesis, melena, hematochezia, diarrhea or constipation. She denies dysuria or frequency.  ED Course: The patient was given supplemental oxygen, furosemide 40 mg IVP 1 and placed on BiPAP ventilation. Her EKG showed atrial fibrillation with LVH, nonspecific ST and T wave abnormality. Troponin level was less than 0.03 ng/mL. BNP was 523 pg/mL. CBC 11.1, hemoglobin 14.1 g/dL and platelets 309. Sodium level CXXIX, potassium 5.0, chloride 96 and bicarbonate 24 mmol/L. Her BUN 23, creatinine 0.82, glucose 303, magnesium 2.0 and phosphorus 4.5 mg/dL. Her chest radiograph showed moderate to severe pulmonary edema with moderate  bilateral pleural effusions (please see report for full details).  Review of Systems: As per HPI otherwise 10 point review of systems negative.    Past Medical History:  Diagnosis Date  . Arteriosclerotic cardiovascular disease (ASCVD)    cath in 2001- 40% LAD and 50% RCA; stent for 80% LAD in 5/04; residual 50% LAD, 80% small D1, 70% small OM1 50% ostial RCA, nl EF   . Atrial fibrillation ([place]) 2007   CHF with preserved LV systolic function - [id number]; moderate LVH  . Bronchitis    history  . Carcinoma of breast ([place])    left masectomy in 1995  . Chronic diastolic heart failure ([place]) 11/21/2009  . Chronic hoarseness   . CVD (cerebrovascular disease)    plaque w/o focal disease in 2006; h/o CVA  . Diabetes mellitus type II    no insulin   . Edema   . Herpes zoster   . Hypertension   . Hypothyroid   . Lower extremity edema 08/20/2011  . MVP (mitral valve prolapse)    moderate; with moderate MR  . Osteoporosis   . Peripheral neuropathy ([place])   . PVD (peripheral vascular disease) ([facility])    ABIs of 0.64 and 0.59, right and left leg in 2009  . 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 Shingles   . Ulcer ([place])    left lower leg  . Varicose veins of legs 08/20/2011    Past Surgical History:  Procedure Laterality Date  . ABDOMINAL HYSTERECTOMY     abd?  . CATARACT EXTRACTION, BILATERAL    . CHOLECYSTECTOMY  2006  . COLONOSCOPY  Date unknown  . CORONARY ANGIOPLASTY WITH STENT PLACEMENT    . left masectomy  1995  . ORIF of left hip  05/22/06  [name]     reports that she has never smoked. She has never used smokeless tobacco. She reports that she does not drink alcohol or use drugs.  No Known Allergies  Family History  Problem Relation Age of Onset  . Diabetes Mother     Prior to Admission medications   Medication Sig Start Date End Date Taking? Authorizing Provider  acetaminophen (TYLENOL) 325 MG tablet Take 650 mg by mouth 2 (two) times daily.    Historical Provider, MD  atorvastatin  (LIPITOR) 20 MG tablet Take 20 mg by mouth daily.    Historical Provider, MD  carbamide peroxide (DEBROX) 6.5 % otic solution Place 2 drops into both ears every Sunday. ADMINISTERED AT BEDTIME ONCE WEEKLY-SUNDAYS    Historical Provider, MD  digoxin (LANOXIN) 0.125 MG tablet Take 125 mcg by mouth daily. Takes after lunch    Historical Provider, MD  diltiazem (TIAZAC) 240 MG 24 hr capsule Take 240 mg by mouth daily.    Historical Provider, MD  ELIQUIS 5 MG TABS tablet Take 5 mg by mouth 2 (two) times daily.  04/19/16   Historical Provider, MD  furosemide (LASIX) 20 MG tablet Take 2 tablets (40 mg total) by mouth 2 (two) times daily. 03/21/16   Barton Dubois, MD  gabapentin (NEURONTIN) 100 MG capsule Take 100 mg by mouth 2 (two) times daily.     Historical Provider, MD  isosorbide mononitrate (IMDUR) 30 MG 24 hr tablet Take 0.5 tablets (15 mg total) by mouth daily. 03/21/16   Barton Dubois, MD  levothyroxine (SYNTHROID, LEVOTHROID) 25 MCG tablet Take 25 mcg by mouth every evening.     Historical Provider, MD  Lin[facility] EX) Apply 1 application topically daily as needed (APPLIED TO KNEE).    Historical Provider, MD  lisinopril (PRINIVIL,ZESTRIL) 20 MG tablet Take 1 tablet (20 mg total) by mouth daily. 03/21/16   Barton Dubois, MD  nebivolol (BYSTOLIC) 10 MG tablet Take 10 mg by mouth every morning.     Historical Provider, MD  ondansetron (ZOFRAN) 4 MG tablet Take 4 mg by mouth 2 (two) times daily.    Historical Provider, MD  potassium chloride SA (K-DUR,KLOR-CON) 20 MEQ tablet Take 2 tablets (40 mEq total) by mouth daily. 03/21/16   Barton Dubois, MD  Probiotic Product (PROBIOTIC DAILY PO) Take 1 capsule by mouth daily.    Historical Provider, MD  Vitamin D, Ergocalciferol, (DRISDOL) 50000 units CAPS capsule Take 50,000 Units by mouth every 30 (thirty) days.    Historical Provider, MD    Physical Exam:  Constitutional: NAD, calm, comfortable Vitals:   09/27/16 2020 09/27/16 2030 09/27/16 2040  09/27/16 2045  BP: 131/55  148/92   Pulse: 117 (!) 27 109 (!) 39  Resp: 20 18 14 19   SpO2: 98% 97% 98% 95%   Eyes: PERRL, lids and conjunctivae normal ENMT: BiPAP mask on. Mucous membranes are moist. Posterior pharynx clear of any exudate or lesions. Neck: normal, supple, no masses, no thyromegaly Respiratory: Decreased breath sounds on both bases, bilateral rales on lower and mid lung fields, mild rhonchi, no wheezing. Normal respiratory effort. No accessory muscle use.  Cardiovascular: Irregularly irregular, bradycardic at 52 BPM, no murmurs / rubs / gallops. 3+ lower extremity edema bilaterally. Decreased pedal pulses. No carotid bruits.  Abdomen: Soft, no tenderness, no masses palpated. No hepatosplenomegaly. Bowel sounds positive.  Musculoskeletal: no clubbing / cyanosis. Positive MCP joints deformities and scoliosis. Decreased ROM due to osteoarthritis/scoliosis, no contractures. Normal muscle tone.  Skin: no  rashes, lesions, ulcers on limited skin exam. Neurologic: CN 2-12 grossly intact. Sensation intact, DTR normal. Strength 5/5 in all 4.  Psychiatric: Normal judgment and insight. Alert and oriented x 3. Normal mood.   Labs on Admission: I have personally reviewed following labs and imaging studies  CBC:  Recent Labs Lab 09/27/16 1937  WBC 11.1*  NEUTROABS 8.9*  HGB 14.1  HCT 44.0  MCV 84.1  PLT [id number]   Basic Metabolic Panel:  Recent Labs Lab 09/27/16 1937  NA 129*  K 5.0  CL 96*  CO2 24  GLUCOSE 303*  BUN 23*  CREATININE 0.82  CALCIUM 9.5   GFR: CrCl cannot be calculated (Unknown ideal weight.). Liver Function Tests: No results for input(s): AST, ALT, ALKPHOS, BILITOT, PROT, ALBUMIN in the last 168 hours. No results for input(s): LIPASE, AMYLASE in the last 168 hours. No results for input(s): AMMONIA in the last 168 hours. Coagulation Profile: No results for input(s): INR, PROTIME in the last 168 hours. Cardiac Enzymes:  Recent Labs Lab 09/27/16 1937    TROPONINI <0.03   BNP (last 3 results) No results for input(s): PROBNP in the last 8760 hours. HbA1C: No results for input(s): HGBA1C in the last 72 hours. CBG: No results for input(s): GLUCAP in the last 168 hours. Lipid Profile: No results for input(s): CHOL, HDL, LDLCALC, TRIG, CHOLHDL, LDLDIRECT in the last 72 hours. Thyroid Function Tests: No results for input(s): TSH, T4TOTAL, FREET4, T3FREE, THYROIDAB in the last 72 hours. Anemia Panel: No results for input(s): VITAMINB12, FOLATE, FERRITIN, TIBC, IRON, RETICCTPCT in the last 72 hours. Urine analysis:    Component Value Date/Time   COLORURINE YELLOW 04/19/2014 1730   APPEARANCEUR CLEAR 04/19/2014 1730   LABSPEC <1.005 (L) 04/19/2014 1730   PHURINE 6.0 04/19/2014 1730   GLUCOSEU NEGATIVE 04/19/2014 1730   HGBUR NEGATIVE 04/19/2014 1730   BILIRUBINUR NEGATIVE 04/19/2014 1730   KETONESUR NEGATIVE 04/19/2014 1730   PROTEINUR NEGATIVE 04/19/2014 1730   UROBILINOGEN 0.2 04/19/2014 1730   NITRITE NEGATIVE 04/19/2014 1730   LEUKOCYTESUR NEGATIVE 04/19/2014 1730    Radiological Exams on Admission: Dg Chest Portable 1 View  Result Date: 09/27/2016 CLINICAL DATA:  83 y/o  F; shortness of breath on BiPAP. EXAM: PORTABLE CHEST 1 VIEW COMPARISON:  03/21/2016 chest radiograph FINDINGS: Diffuse patchy opacification of the lungs probably represents moderate to severe pulmonary edema. Moderate bilateral pleural effusions. Bibasilar opacities probably represent atelectasis. The cardiac silhouette is obscured by effusions and airspace disease. Aortic atherosclerosis with calcification. No acute osseous abnormality identified. IMPRESSION: Moderate to severe pulmonary edema and moderate bilateral pleural effusions. Underlying pneumonia is not excluded. Electronically Signed   By: Kristine Garbe M.D.   On: 09/27/2016 20:05  03/18/2016 echocardiogram ------------------------------------------------------------------- LV EF: 60% -    65%  ------------------------------------------------------------------- Indications:      CHF - 428.0.  ------------------------------------------------------------------- History:   PMH:  Acute pulmonary edema.  Atrial fibrillation.  PMH:  Left mastectomy, Carcinoma of breast.  Risk factors: Hypertension. Diabetes mellitus. Dyslipidemia.  ------------------------------------------------------------------- Study Conclusions  - Left ventricle: The cavity size was normal. There was focal basal   hypertrophy. Systolic function was normal. The estimated ejection   fraction was in the range of 60% to 65%. Wall motion was normal;   there were no regional wall motion abnormalities. - Aortic valve: Moderately calcified annulus. Trileaflet; mildly   thickened leaflets. Valve area (VTI): 1.55 cm^2. Valve area   (Vmax): 1.4 cm^2. Valve area (Vmean): 1.39 cm^2. - Mitral valve: Moderately calcified  annulus. Mildly thickened   leaflets . There was mild regurgitation. - Left atrium: The atrium was massively dilated. - Right atrium: The atrium was severely dilated. - Atrial septum: No defect or patent foramen ovale was identified. - Pulmonary arteries: Systolic pressure was moderately increased.   PA peak pressure: 47 mm Hg (S). - Pericardium, extracardiac: There is a large left pleural   effusion. There is a small circumferential pericardial effusion. - Technically adequate study.  EKG: Independently reviewed. Vent. rate 63 BPM PR interval * ms QRS duration 98 ms QT/QTc 451/462 ms P-R-T axes * 83 * Atrial fibrillation Borderline right axis deviation LVH with secondary repolarization abnormality Nonspecific ST and T wave abnormality Diffuse  Assessment/Plan Principal problem:   Acute pulmonary edema with congestive heart failure (HCC) Admit to stepdown/inpatient. Continue supplemental oxygen and BiPAP ventilation. Continue nitro paste. Trend troponin levels. Furosemide 40 mg IVP  twice a day. Continue lisinopril 20 mg by mouth daily Hold beta and calcium channel blocker. Intake and output. Daily weights. Monitor BUN, creatinine and electrolytes.  Active Problems:   Acute respiratory failure with hypoxia (HCC) Continue supplemental oxygen and BiPAP ventilation. Scheduled and as needed bronchodilators. Follow-up checks x-ray in the morning.    Atrial fibrillation (HCC) CHA2DS2-VASc Score of at least 7. Continue Eliquis 5 mg by mouth daily Currently bradycardic in the 40s and 50s. Hold digoxin, beta and calcium channel blockers. Recheck digoxin level in a.m.     Type 2 diabetes mellitus (HCC) Last hemoglobin A1c was 8.2% in August. The patient is only following diet measures. Will do regular insulin sliding scale while in the hospital. Recheck hemoglobin A1c in the morning.    Hyperlipidemia Atorvastatin 20 mg by mouth daily. Monitor LFTs periodically. Outpatient follow-up of fasting lipids.    Hypertension Normotensive at this time. Hold Cardizem and beta blocker due to bradycardia. Continue lisinopril 20 mg by mouth daily. Monitor blood pressure.      Hyponatremia Hypervolemic in nature due to volume overload. Continue current treatment and monitor sodium level.    DVT prophylaxis: On Eliquis. Code Status: Full code. Family Communication: Her son was present in the room. Disposition Plan: Admit to continue BiPAP ventilation and IV diuresis. Consults called:  Admission status: Inpatient/stepdown.   Reubin Milan MD [facility] Pager [phone].  If 7PM-7AM, please contact night-coverage [email or web address] Password [email or web address]  09/27/2016, 8:59 PM

## 2016-09-27 NOTE — ED Provider Notes (Signed)
[place] DEPT Provider Note   CSN: [id number] Arrival date & time: 09/27/16  1924     History   Chief Complaint Chief Complaint  Patient presents with  . Shortness of Breath    HPI KENNEDII ARTIST is a 83 y.o. female.  The history is provided by the EMS personnel, the nursing home and the patient. The history is limited by the condition of the patient (Urgent need for intervention).  Shortness of Breath   Pt was seen at 1930. Per EMS, NH report and pt: NH states they "found pt SOB PTA." Pt states she has had cough and increasing pedal edema for the past several days. SOB worsened today. Pt endorses hx of CHF and "this feels the same." EMS placed pt on NRB O2 for transport.  Denies CP/palpitations, no back pain, no fevers, no abd pain, no N/V/D.   Past Medical History:  Diagnosis Date  . Arteriosclerotic cardiovascular disease (ASCVD)    cath in 2001- 40% LAD and 50% RCA; stent for 80% LAD in 5/04; residual 50% LAD, 80% small D1, 70% small OM1 50% ostial RCA, nl EF   . Atrial fibrillation ([place]) 2007   CHF with preserved LV systolic function - [id number]; moderate LVH  . Bronchitis    history  . Carcinoma of breast ([place])    left masectomy in 1995  . Chronic diastolic heart failure ([place]) 11/21/2009  . Chronic hoarseness   . CVD (cerebrovascular disease)    plaque w/o focal disease in 2006; h/o CVA  . Diabetes mellitus type II    no insulin   . Edema   . Herpes zoster   . Hypertension   . Hypothyroid   . Lower extremity edema 08/20/2011  . MVP (mitral valve prolapse)    moderate; with moderate MR  . Osteoporosis   . Peripheral neuropathy ([place])   . PVD (peripheral vascular disease) ([facility])    ABIs of 0.64 and 0.59, right and left leg in 2009  . 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 Shingles   . Ulcer ([place])    left lower leg  . Varicose veins of legs 08/20/2011    Patient Active Problem List   Diagnosis Date Noted  . SOB (shortness of breath)   . Lewy body dementia without behavioral  disturbance   . Acute respiratory failure with hypoxia ([place]) 03/18/2016  . Hyponatremia 03/18/2016  . Acute pulmonary edema ([place]) 03/17/2016  . Acute CHF (congestive heart failure) ([place]) 03/17/2016  . Abdominal distention 04/13/2014  . Hip pain 04/12/2014  . Labral tear of hip, degenerative 04/12/2014  . Trochanteric bursitis of right hip 04/12/2014  . Encounter for therapeutic drug monitoring 10/06/2013  . Lower extremity edema 08/20/2011  . Arteriosclerotic cardiovascular disease (ASCVD)   . Carcinoma of breast ([place])   . Atrial fibrillation ([place])   . MVP (mitral valve prolapse)   . PVD (peripheral vascular disease) ([place])   . CVD (cerebrovascular disease)   . Thyroid activity decreased   . Osteoporosis   . Chronic anticoagulation 11/22/2010  . DIABETES MELLITUS, TYPE II 11/21/2009  . Hyperlipidemia 11/21/2009  . Hypertension 11/21/2009  . Acute on chronic diastolic heart failure ([place]) 11/21/2009  . SPINAL STENOSIS, LUMBAR 09/05/2008    Past Surgical History:  Procedure Laterality Date  . ABDOMINAL HYSTERECTOMY     abd?  . CATARACT EXTRACTION, BILATERAL    . CHOLECYSTECTOMY  2006  . COLONOSCOPY  Date unknown  . CORONARY ANGIOPLASTY WITH STENT PLACEMENT    .  left masectomy  1995  . ORIF of left hip  05/22/06   Darylene Price History    No data available       Home Medications    Prior to Admission medications   Medication Sig Start Date End Date Taking? Authorizing Provider  acetaminophen (TYLENOL) 325 MG tablet Take 650 mg by mouth 2 (two) times daily.    Historical Provider, MD  atorvastatin (LIPITOR) 20 MG tablet Take 20 mg by mouth daily.    Historical Provider, MD  carbamide peroxide (DEBROX) 6.5 % otic solution Place 2 drops into both ears every Sunday. ADMINISTERED AT BEDTIME ONCE WEEKLY-SUNDAYS    Historical Provider, MD  digoxin (LANOXIN) 0.125 MG tablet Take 125 mcg by mouth daily. Takes after lunch    Historical Provider, MD  diltiazem (TIAZAC) 240 MG  24 hr capsule Take 240 mg by mouth daily.    Historical Provider, MD  ELIQUIS 5 MG TABS tablet Take 5 mg by mouth 2 (two) times daily.  04/19/16   Historical Provider, MD  furosemide (LASIX) 20 MG tablet Take 2 tablets (40 mg total) by mouth 2 (two) times daily. 03/21/16   Barton Dubois, MD  gabapentin (NEURONTIN) 100 MG capsule Take 100 mg by mouth 2 (two) times daily.     Historical Provider, MD  isosorbide mononitrate (IMDUR) 30 MG 24 hr tablet Take 0.5 tablets (15 mg total) by mouth daily. 03/21/16   Barton Dubois, MD  levothyroxine (SYNTHROID, LEVOTHROID) 25 MCG tablet Take 25 mcg by mouth every evening.     Historical Provider, MD  [facility] EX) Apply 1 application topically daily as needed (APPLIED TO KNEE).    Historical Provider, MD  lisinopril (PRINIVIL,ZESTRIL) 20 MG tablet Take 1 tablet (20 mg total) by mouth daily. 03/21/16   Barton Dubois, MD  nebivolol (BYSTOLIC) 10 MG tablet Take 10 mg by mouth every morning.     Historical Provider, MD  ondansetron (ZOFRAN) 4 MG tablet Take 4 mg by mouth 2 (two) times daily.    Historical Provider, MD  potassium chloride SA (K-DUR,KLOR-CON) 20 MEQ tablet Take 2 tablets (40 mEq total) by mouth daily. 03/21/16   Barton Dubois, MD  Probiotic Product (PROBIOTIC DAILY PO) Take 1 capsule by mouth daily.    Historical Provider, MD  Vitamin D, Ergocalciferol, (DRISDOL) 50000 units CAPS capsule Take 50,000 Units by mouth every 30 (thirty) days.    Historical Provider, MD    Family History Family History  Problem Relation Age of Onset  . Diabetes Mother     Social History Social History  Substance Use Topics  . Smoking status: Never Smoker  . Smokeless tobacco: Never Used     Comment: tobacco use - no   . Alcohol use No     Allergies   Patient has no known allergies.   Review of Systems Review of Systems  Unable to perform ROS: Acuity of condition  Respiratory: Positive for shortness of breath.      Physical Exam Updated Vital  Signs BP (!) 148/50   Pulse (!) 28   Resp 19   SpO2 100%   Patient Vitals for the past 24 hrs:  BP Pulse Resp SpO2  09/27/16 2000 (!) 148/50 (!) 28 19 100 %  09/27/16 1954 (!) 156/108 (!) 55 20 100 %  09/27/16 1952 (!) 156/108 (!) 51 (!) 33 -     Physical Exam 1935: Physical examination:  Nursing notes reviewed; Vital signs and O2 SAT  reviewed;  Constitutional: Thin, frail. Uncomfortable appearing.;; Head:  Normocephalic, atraumatic; Eyes: EOMI, PERRL, No scleral icterus; ENMT: Mouth and pharynx normal, Mucous membranes dry;; Neck: Supple, Full range of motion, No lymphadenopathy; Cardiovascular: Bradycardic rate and rhythm, No gallop; Respiratory: Breath sounds coarse & equal bilaterally, faint wheezes. No audible wheezing.  Speaking few words, sitting upright, tachypneic.; Chest: Nontender, Movement normal; Abdomen: Soft, Nontender, Nondistended, Normal bowel sounds; Genitourinary: No CVA tenderness; Extremities: Pulses normal, No tenderness, +2 pedal edema bilat. No calf asymmetry.; Neuro: AA&Ox3, Major CN grossly intact. No facial droop. Speech clear. Moves all extremities on stretcher spontaneously and to command without apparent gross focal motor deficits.; Skin: Color normal, Warm, Dry.    ED Treatments / Results  Labs (all labs ordered are listed, but only abnormal results are displayed)   EKG  EKG Interpretation  Date/Time:  Friday September 27 2016 19:29:37 EST Ventricular Rate:  63 PR Interval:    QRS Duration: 98 QT Interval:  451 QTC Calculation: 462 R Axis:   83 Text Interpretation:  Atrial fibrillation Borderline right axis deviation LVH with secondary repolarization abnormality Nonspecific ST and T wave abnormality Diffuse When compared with ECG of 03/17/2016 Nonspecific ST and T wave abnormality is more pronounced Confirmed by [facility]  MD, Nunzio Cory [phone]) on 09/27/2016 7:39:27 PM       Radiology   Procedures Procedures (including critical care  time)  Medications Ordered in ED Medications  furosemide (LASIX) injection 40 mg (40 mg Intravenous Given 09/27/16 1947)  nitroGLYCERIN (NITROGLYN) 2 % ointment 1 inch (1 inch Topical Given 09/27/16 1945)     Initial Impression / Assessment and Plan / ED Course  I have reviewed the triage vital signs and the nursing notes.  Pertinent labs & imaging results that were available during my care of the patient were reviewed by me and considered in my medical decision making (see chart for details).  MDM Reviewed: previous chart, nursing note and vitals Reviewed previous: labs and ECG Interpretation: labs, ECG and x-ray Total time providing critical care: 30-74 minutes. This excludes time spent performing separately reportable procedures and services. Consults: admitting MD   CRITICAL CARE Performed by: Alfonzo Feller Total critical care time: 35 minutes Critical care time was exclusive of separately billable procedures and treating other patients. Critical care was necessary to treat or prevent imminent or life-threatening deterioration. Critical care was time spent personally by me on the following activities: development of treatment plan with patient and/or surrogate as well as nursing, discussions with consultants, evaluation of patient's response to treatment, examination of patient, obtaining history from patient or surrogate, ordering and performing treatments and interventions, ordering and review of laboratory studies, ordering and review of radiographic studies, pulse oximetry and re-evaluation of patient's condition.  Results for orders placed or performed during the hospital encounter of 09/27/16  Brain natriuretic peptide  Result Value Ref Range   B Natriuretic Peptide 523.0 (H) 0.0 - 100.0 pg/mL  Basic metabolic panel  Result Value Ref Range   Sodium 129 (L) 135 - 145 mmol/L   Potassium 5.0 3.5 - 5.1 mmol/L   Chloride 96 (L) 101 - 111 mmol/L   CO2 24 22 - 32 mmol/L    Glucose, Bld 303 (H) 65 - 99 mg/dL   BUN 23 (H) 6 - 20 mg/dL   Creatinine, Ser 0.82 0.44 - 1.00 mg/dL   Calcium 9.5 8.9 - 10.3 mg/dL   GFR calc non Af Amer 60 (L) >60 mL/min   GFR calc Af  Amer >60 >60 mL/min   Anion gap 9 5 - 15  CBC with Differential  Result Value Ref Range   WBC 11.1 (H) 4.0 - 10.5 K/uL   RBC 5.23 (H) 3.87 - 5.11 MIL/uL   Hemoglobin 14.1 12.0 - 15.0 g/dL   HCT 44.0 36.0 - 46.0 %   MCV 84.1 78.0 - 100.0 fL   MCH 27.0 26.0 - 34.0 pg   MCHC 32.0 30.0 - 36.0 g/dL   RDW 17.3 (H) 11.5 - 15.5 %   Platelets 309 150 - 400 K/uL   Neutrophils Relative % 81 %   Neutro Abs 8.9 (H) 1.7 - 7.7 K/uL   Lymphocytes Relative 12 %   Lymphs Abs 1.4 0.7 - 4.0 K/uL   Monocytes Relative 6 %   Monocytes Absolute 0.6 0.1 - 1.0 K/uL   Eosinophils Relative 1 %   Eosinophils Absolute 0.2 0.0 - 0.7 K/uL   Basophils Relative 0 %   Basophils Absolute 0.0 0.0 - 0.1 K/uL  Troponin I  Result Value Ref Range   Troponin I <0.03 <0.03 ng/mL  Digoxin level  Result Value Ref Range   Digoxin Level 1.6 0.8 - 2.0 ng/mL   Dg Chest Portable 1 View Result Date: 09/27/2016 CLINICAL DATA:  83 y/o  F; shortness of breath on BiPAP. EXAM: PORTABLE CHEST 1 VIEW COMPARISON:  03/21/2016 chest radiograph FINDINGS: Diffuse patchy opacification of the lungs probably represents moderate to severe pulmonary edema. Moderate bilateral pleural effusions. Bibasilar opacities probably represent atelectasis. The cardiac silhouette is obscured by effusions and airspace disease. Aortic atherosclerosis with calcification. No acute osseous abnormality identified. IMPRESSION: Moderate to severe pulmonary edema and moderate bilateral pleural effusions. Underlying pneumonia is not excluded. Electronically Signed   By: Kristine Garbe M.D.   On: 09/27/2016 20:05    2035:  On arrival: pt sitting upright, tachypneic, visibly SOB, Sats 100% on NRB O2. Bipap started. IV lasix and topical ntg paste applied. Pt now appears more  comfortable, states she "feels better now," Sats remain 100% on bipap. BP stable. Hyponatremia is chronic. Dx and testing d/w pt and family.  Questions answered.  Verb understanding, agreeable to admit. T/C to [facility]. Olevia Bowens, case discussed, including:  HPI, pertinent PM/SHx, VS/PE, dx testing, ED course and treatment:  Agreeable to admit.    Final Clinical Impressions(s) / ED Diagnoses   Final diagnoses:  None    New Prescriptions New Prescriptions   No medications on file      Francine Graven, DO 09/30/16 1532

## 2016-09-28 DIAGNOSIS — I10 Essential (primary) hypertension: Secondary | ICD-10-CM

## 2016-09-28 DIAGNOSIS — E871 Hypo-osmolality and hyponatremia: Secondary | ICD-10-CM

## 2016-09-28 DIAGNOSIS — I482 Chronic atrial fibrillation: Secondary | ICD-10-CM

## 2016-09-28 DIAGNOSIS — I501 Left ventricular failure: Secondary | ICD-10-CM

## 2016-09-28 LAB — BASIC METABOLIC PANEL
Anion gap: 5 (ref 5–15)
BUN: 18 mg/dL (ref 6–20)
CHLORIDE: 102 mmol/L (ref 101–111)
CO2: 29 mmol/L (ref 22–32)
CREATININE: 0.71 mg/dL (ref 0.44–1.00)
Calcium: 8.6 mg/dL — ABNORMAL LOW (ref 8.9–10.3)
Glucose, Bld: 125 mg/dL — ABNORMAL HIGH (ref 65–99)
POTASSIUM: 4 mmol/L (ref 3.5–5.1)
SODIUM: 136 mmol/L (ref 135–145)

## 2016-09-28 LAB — CBC WITH DIFFERENTIAL/PLATELET
BASOS ABS: 0 10*3/uL (ref 0.0–0.1)
BASOS PCT: 0 %
EOS PCT: 3 %
Eosinophils Absolute: 0.1 10*3/uL (ref 0.0–0.7)
HCT: 36.3 % (ref 36.0–46.0)
Hemoglobin: 11.7 g/dL — ABNORMAL LOW (ref 12.0–15.0)
LYMPHS PCT: 22 %
Lymphs Abs: 1.1 10*3/uL (ref 0.7–4.0)
MCH: 27 pg (ref 26.0–34.0)
MCHC: 32.2 g/dL (ref 30.0–36.0)
MCV: 83.8 fL (ref 78.0–100.0)
MONO ABS: 0.5 10*3/uL (ref 0.1–1.0)
Monocytes Relative: 10 %
Neutro Abs: 3.4 10*3/uL (ref 1.7–7.7)
Neutrophils Relative %: 65 %
PLATELETS: 209 10*3/uL (ref 150–400)
RBC: 4.33 MIL/uL (ref 3.87–5.11)
RDW: 17 % — AB (ref 11.5–15.5)
WBC: 5.2 10*3/uL (ref 4.0–10.5)

## 2016-09-28 LAB — GLUCOSE, CAPILLARY
GLUCOSE-CAPILLARY: 144 mg/dL — AB (ref 65–99)
GLUCOSE-CAPILLARY: 164 mg/dL — AB (ref 65–99)
GLUCOSE-CAPILLARY: 77 mg/dL (ref 65–99)
GLUCOSE-CAPILLARY: 118 mg/dL — AB (ref 65–99)

## 2016-09-28 LAB — TROPONIN I

## 2016-09-28 LAB — TSH: TSH: 1.288 u[IU]/mL (ref 0.350–4.500)

## 2016-09-28 LAB — MRSA PCR SCREENING: MRSA BY PCR: NEGATIVE

## 2016-09-28 MED ORDER — DILTIAZEM HCL ER COATED BEADS 180 MG PO CP24
180.0000 mg | ORAL_CAPSULE | Freq: Every day | ORAL | Status: DC
  Administered 2016-09-28: 180 mg via ORAL
  Filled 2016-09-28: qty 1

## 2016-09-28 MED ORDER — CHLORHEXIDINE GLUCONATE 0.12 % MT SOLN
15.0000 mL | Freq: Two times a day (BID) | OROMUCOSAL | Status: DC
  Administered 2016-09-28 – 2016-09-30 (×5): 15 mL via OROMUCOSAL
  Filled 2016-09-28 (×5): qty 15

## 2016-09-28 MED ORDER — SODIUM CHLORIDE 0.9 % IV SOLN: 1.0000 g | Freq: Once | INTRAVENOUS | Status: DC

## 2016-09-28 MED ORDER — FUROSEMIDE 10 MG/ML IJ SOLN
20.0000 mg | Freq: Two times a day (BID) | INTRAMUSCULAR | Status: DC
  Administered 2016-09-28 – 2016-09-30 (×5): 20 mg via INTRAVENOUS
  Filled 2016-09-28 (×5): qty 2

## 2016-09-28 MED ORDER — ORAL CARE MOUTH RINSE
15.0000 mL | Freq: Two times a day (BID) | OROMUCOSAL | Status: DC
  Administered 2016-09-28 – 2016-09-29 (×3): 15 mL via OROMUCOSAL

## 2016-09-28 MED ORDER — OXYCODONE HCL 5 MG PO TABS
2.5000 mg | ORAL_TABLET | ORAL | Status: DC | PRN
  Administered 2016-09-28: 2.5 mg via ORAL
  Filled 2016-09-28: qty 1

## 2016-09-28 MED ORDER — HALOPERIDOL LACTATE 5 MG/ML IJ SOLN: 1.0000 mg | Freq: Once | INTRAMUSCULAR | Status: DC

## 2016-09-28 MED ORDER — DILTIAZEM HCL 25 MG/5ML IV SOLN
10.0000 mg | Freq: Once | INTRAVENOUS | Status: AC
  Administered 2016-09-28: 10 mg via INTRAVENOUS
  Filled 2016-09-28: qty 5

## 2016-09-28 MED ORDER — ATROPINE SULFATE 1 MG/10ML IJ SOSY
PREFILLED_SYRINGE | INTRAMUSCULAR | Status: AC
  Filled 2016-09-28: qty 10

## 2016-09-28 NOTE — Progress Notes (Signed)
MD notified of hr rate in 120s- 130s sustained. New orders received and noted

## 2016-09-28 NOTE — Progress Notes (Signed)
Patient appears much better most likely will not need BiPAP.

## 2016-09-28 NOTE — Progress Notes (Signed)
Patient has times HR drops in low 30's to 28 , patient is asymptomatic. Nurse and MD aware.

## 2016-09-28 NOTE — Plan of Care (Signed)
Problem: Safety: Goal: Ability to remain free from injury will improve Pt able to demonstrate correct use of call bed. Bed in low position, side rails up and bed alarm in place

## 2016-09-28 NOTE — Progress Notes (Signed)
PROGRESS NOTE    [name]  [id number] DOB: [birth year] DOA: 09/27/2016 PCP: Wende Neighbors, MD    Brief Narrative: 83 yo SNF resident with hx of chronic afib on Eliquis, known CAD, CHF with preserved EF, PVD, hx of CVA, MVP, breast CA, DM2, HTN, hypothyroidism, admitted for acute on chronic CHF.  Her cardiazem was held, she was given IV lasix, and required bipap.  Her HR dipped to the 30, asymptomatic.  She is feeling better this morning.     Assessment & Plan:   Principal Problem:   Acute pulmonary edema with congestive heart failure (HCC) Active Problems:   Type 2 diabetes mellitus (HCC)   Hyperlipidemia   Hypertension   Atrial fibrillation (HCC)   Acute respiratory failure with hypoxia (HCC)   Hyponatremia    Acute respiratory failure with hypoxia (HCC) Continue supplemental oxygen and BiPAP ventilation. Scheduled and as needed bronchodilators. Doing better this morning.      Atrial fibrillation (HCC) CHA2DS2-VASc Score of at least 7. Continue Eliquis 5 mg by mouth daily Was bradycardic.  Cardizem, BB, and dig held.  Dig level 1.6.    Type 2 diabetes mellitus (HCC) Last hemoglobin A1c was 8.2% in August. The patient is only following diet measures. Will do regular insulin sliding scale while in the hospital. Recheck hemoglobin A1c in the morning.    Hyperlipidemia Atorvastatin 20 mg by mouth daily. Monitor LFTs periodically. Outpatient follow-up of fasting lipids.    Hypertension Normotensive at this time. Hold Cardizem and beta blocker due to bradycardia. Continue lisinopril 20 mg by mouth daily. Monitor blood pressure.      Hyponatremia Hypervolemic in nature due to volume overload. Continue current treatment and monitor sodium level.    DVT prophylaxis: On Eliquis. Code Status: Full code. Family Communication: Her son was present in the room. Disposition Plan: Admit to continue BiPAP ventilation and IV diuresis. Consults called:  Admission  status: Inpatient/stepdown.   Antimicrobials: Anti-infectives    None       Subjective: Feeling better.    Objective: Vitals:   09/28/16 0716 09/28/16 0800 09/28/16 0900 09/28/16 0921  BP:  (!) 118/57 139/64 139/64  Pulse: (!) 53 (!) 55 67   Resp: 17 17 (!) 22   Temp:  97.7 F (36.5 C)    TempSrc:  Axillary    SpO2: 98% 99% 94%   Weight:      Height:        Intake/Output Summary (Last 24 hours) at 09/28/16 1001 Last data filed at 09/28/16 0000  Gross per 24 hour  Intake              260 ml  Output                0 ml  Net              260 ml   Filed Weights   09/27/16 2209 09/28/16 0400  Weight: 52.5 kg (115 lb 11.9 oz) 52.4 kg (115 lb 8.3 oz)    Examination:  General exam: Appears calm and comfortable  Respiratory system: Clear to auscultation. Respiratory effort normal. Cardiovascular system: S1 & S2 heard, RRR. No JVD, murmurs, rubs, gallops or clicks. No pedal edema. Gastrointestinal system: Abdomen is nondistended, soft and nontender. No organomegaly or masses felt. Normal bowel sounds heard. Central nervous system: Alert and oriented. No focal neurological deficits. Extremities: Symmetric 5 x 5 power. Skin: No rashes, lesions or ulcers Psychiatry: Judgement and insight appear normal.  Mood & affect appropriate.   Data Reviewed: I have personally reviewed following labs and imaging studies  CBC:  Recent Labs Lab 09/27/16 1937 09/28/16 0822  WBC 11.1* 5.2  NEUTROABS 8.9* 3.4  HGB 14.1 11.7*  HCT 44.0 36.3  MCV 84.1 83.8  PLT 309 [id number]   Basic Metabolic Panel:  Recent Labs Lab 09/27/16 1935 09/27/16 1937 09/28/16 0822  NA  --  129* 136  K  --  5.0 4.0  CL  --  96* 102  CO2  --  24 29  GLUCOSE  --  303* 125*  BUN  --  23* 18  CREATININE  --  0.82 0.71  CALCIUM  --  9.5 8.6*  MG 2.0  --   --   PHOS 4.5  --   --    Cardiac Enzymes:  Recent Labs Lab 09/27/16 1937 09/28/16 0013 09/28/16 0822  TROPONINI <0.03 <0.03 <0.03    CBG:  Recent Labs Lab 09/27/16 2219 09/28/16 0704  GLUCAP 284* 144*    Thyroid Function Tests:  Recent Labs  09/28/16 0822  TSH 1.288     Radiology Studies: Dg Chest Portable 1 View  Result Date: 09/27/2016 CLINICAL DATA:  83 y/o  F; shortness of breath on BiPAP. EXAM: PORTABLE CHEST 1 VIEW COMPARISON:  03/21/2016 chest radiograph FINDINGS: Diffuse patchy opacification of the lungs probably represents moderate to severe pulmonary edema. Moderate bilateral pleural effusions. Bibasilar opacities probably represent atelectasis. The cardiac silhouette is obscured by effusions and airspace disease. Aortic atherosclerosis with calcification. No acute osseous abnormality identified. IMPRESSION: Moderate to severe pulmonary edema and moderate bilateral pleural effusions. Underlying pneumonia is not excluded. Electronically Signed   By: Kristine Garbe M.D.   On: 09/27/2016 20:05    Scheduled Meds: . acetaminophen  650 mg Oral BID  . apixaban  5 mg Oral BID  . atorvastatin  20 mg Oral q1800  . atropine      . chlorhexidine  15 mL Mouth Rinse BID  . furosemide  20 mg Intravenous BID  . gabapentin  100 mg Oral BID  . insulin aspart  0-9 Units Subcutaneous TID WC  . ipratropium-albuterol  3 mL Nebulization Q6H  . levothyroxine  25 mcg Oral QAC breakfast  . lisinopril  20 mg Oral Daily  . mouth rinse  15 mL Mouth Rinse q12n4p  . nitroGLYCERIN  1 inch Topical Once  . ondansetron  4 mg Oral BID  . potassium chloride SA  40 mEq Oral Daily   Continuous Infusions:   LOS: 1 day   Angely Dietz, MD FACP Hospitalist.   If 7PM-7AM, please contact night-coverage [email or web address] Password [email or web address] 09/28/2016, 10:01 AM

## 2016-09-29 LAB — GLUCOSE, CAPILLARY
GLUCOSE-CAPILLARY: 79 mg/dL (ref 65–99)
GLUCOSE-CAPILLARY: 98 mg/dL (ref 65–99)
GLUCOSE-CAPILLARY: 118 mg/dL — AB (ref 65–99)
Glucose-Capillary: 141 mg/dL — ABNORMAL HIGH (ref 65–99)

## 2016-09-29 LAB — BASIC METABOLIC PANEL
Anion gap: 5 (ref 5–15)
BUN: 17 mg/dL (ref 6–20)
CHLORIDE: 104 mmol/L (ref 101–111)
CO2: 31 mmol/L (ref 22–32)
CREATININE: 0.73 mg/dL (ref 0.44–1.00)
Calcium: 8.4 mg/dL — ABNORMAL LOW (ref 8.9–10.3)
GFR calc Af Amer: >60 mL/min (ref >60)
GFR calc non Af Amer: >60 mL/min (ref >60)
GLUCOSE: 88 mg/dL (ref 65–99)
POTASSIUM: 4.1 mmol/L (ref 3.5–5.1)
Sodium: 140 mmol/L (ref 135–145)

## 2016-09-29 LAB — HEMOGLOBIN A1C
Hgb A1c MFr Bld: 7.4 % — ABNORMAL HIGH (ref 4.8–5.6)
Mean Plasma Glucose: 166 mg/dL

## 2016-09-29 LAB — TROPONIN I
Troponin I: <0.03 ng/mL (ref <0.03)
Troponin I: <0.03 ng/mL (ref <0.03)

## 2016-09-29 MED ORDER — DILTIAZEM HCL ER COATED BEADS 120 MG PO CP24
120.0000 mg | ORAL_CAPSULE | Freq: Every day | ORAL | Status: DC
  Administered 2016-09-29 – 2016-09-30 (×2): 120 mg via ORAL
  Filled 2016-09-29 (×2): qty 1

## 2016-09-29 MED ORDER — IPRATROPIUM-ALBUTEROL 0.5-2.5 (3) MG/3ML IN SOLN
3.0000 mL | Freq: Four times a day (QID) | RESPIRATORY_TRACT | Status: DC
  Administered 2016-09-29 – 2016-09-30 (×4): 3 mL via RESPIRATORY_TRACT
  Filled 2016-09-29 (×4): qty 3

## 2016-09-29 NOTE — Progress Notes (Signed)
PROGRESS NOTE    [name]  [id number] DOB: 11/20/1920 DOA: 09/27/2016 PCP: Wende Neighbors, MD    Brief Narrative:  83 yo SNF resident with hx of chronic afib on Eliquis, known CAD, CHF with preserved EF, PVD, hx of CVA, MVP, breast CA, DM2, HTN, hypothyroidism, admitted for acute on chronic CHF.  Her cardiazem was held, she was given IV lasix, and required bipap.  Her HR dipped to the 30, asymptomatic.  She is feeling better this morning.  She had some abdominal distention, probably due to Bipap, and this has resolved.  She has some cramping, and was given low dose percocet yesterday.  She has no other complaints.    Assessment & Plan:   Principal Problem:   Acute pulmonary edema with congestive heart failure (HCC) Active Problems:   Type 2 diabetes mellitus (HCC)   Hyperlipidemia   Hypertension   Atrial fibrillation (HCC)   Acute respiratory failure with hypoxia (HCC)   Hyponatremia  Acute respiratory failure with hypoxia (HCC) Continue supplemental oxygen and BiPAP ventilation. Scheduled and as needed bronchodilators. Doing better this morning.  Transfer her to telemetry today.  Plan for discharge tomorrow. Obtain physical therapy.    Atrial fibrillation (HCC) CHA2DS2-VASc Scoreof at least 7. Continue Eliquis5 mg by mouth daily Was bradycardic.  Cardizem, BB, and dig held.  Dig level 1.6.  Her HR is better, so will resume lower dose of cardiazem at 120mg  per day.  Type 2 diabetes mellitus (HCC) Last hemoglobin A1c was 8.2% in August. The patient is only following diet measures. Will do regular insulin sliding scale while in the hospital. Recheck hemoglobin A1c in the morning.  Hyperlipidemia Atorvastatin 20 mg by mouth daily. Monitor LFTs periodically. Outpatient follow-up of fasting lipids.  Hypertension Normotensive at this time. Hold Cardizem and beta blocker due to bradycardia. Continue lisinopril 20 mg by mouth daily. Monitor blood  pressure.  Hyponatremia Hypervolemic in nature due to volume overload. Continue current treatment and monitor sodium level.    DVT prophylaxis:On Eliquis. Code Status:Full code. Family Communication:Her son was present in the room. Disposition Plan:Admit to continue BiPAP ventilation and IV diuresis. Consults called: Admission status:Inpatient/stepdown.   Antimicrobials: Anti-infectives    None       Subjective:No complaints.    Objective: Vitals:   09/29/16 0400 09/29/16 0500 09/29/16 0600 09/29/16 0711  BP: 135/72 (!) 145/74 127/73   Pulse: 63 76  75  Resp: 17 (!) 22 17 (!) 21  Temp: 97.6 F (36.4 C)   97.8 F (36.6 C)  TempSrc: Axillary   Oral  SpO2: 98% 97%  95%  Weight:  51.1 kg (112 lb 10.5 oz)    Height:        Intake/Output Summary (Last 24 hours) at 09/29/16 0745 Last data filed at 09/29/16 0500  Gross per 24 hour  Intake                0 ml  Output              250 ml  Net             -250 ml   Filed Weights   09/27/16 2209 09/28/16 0400 09/29/16 0500  Weight: 52.5 kg (115 lb 11.9 oz) 52.4 kg (115 lb 8.3 oz) 51.1 kg (112 lb 10.5 oz)    Examination:  General exam: Appears calm and comfortable  Respiratory system: Clear to auscultation. Respiratory effort normal. Cardiovascular system: S1 & S2 heard, RRR. No JVD,  murmurs, rubs, gallops or clicks. No pedal edema. Gastrointestinal system: Abdomen is nondistended, soft and nontender. No organomegaly or masses felt. Normal bowel sounds heard. Central nervous system: Alert and oriented. No focal neurological deficits. Extremities: Symmetric 5 x 5 power. Skin: No rashes, lesions or ulcers Psychiatry: Judgement and insight appear normal. Mood & affect appropriate.   Data Reviewed: I have personally reviewed following labs and imaging studies  CBC:  Recent Labs Lab 09/27/16 1937 09/28/16 0822  WBC 11.1* 5.2  NEUTROABS 8.9* 3.4  HGB 14.1 11.7*  HCT 44.0 36.3  MCV 84.1 83.8  PLT  309 [id number]   Basic Metabolic Panel:  Recent Labs Lab 09/27/16 1935 09/27/16 1937 09/28/16 0822 09/29/16 0452  NA  --  129* 136 140  K  --  5.0 4.0 4.1  CL  --  96* 102 104  CO2  --  24 29 31   GLUCOSE  --  303* 125* 88  BUN  --  23* 18 17  CREATININE  --  0.82 0.71 0.73  CALCIUM  --  9.5 8.6* 8.4*  MG 2.0  --   --   --   PHOS 4.5  --   --   --    GFR:  Recent Labs Lab 09/28/16 0013 09/28/16 0822 09/28/16 1309 09/28/16 2319 09/29/16 0452  TROPONINI <0.03 <0.03 <0.03 <0.03 <0.03   CBG:  Recent Labs Lab 09/28/16 0704 09/28/16 1049 09/28/16 1623 09/28/16 2105 09/29/16 0710  GLUCAP 144* 164* 77 118* 79   Thyroid Function Tests:  Recent Labs  09/28/16 0822  TSH 1.288    Recent Results (from the past 240 hour(s))  MRSA PCR Screening     Status: None   Collection Time: 09/27/16 10:01 PM  Result Value Ref Range Status   MRSA by PCR NEGATIVE NEGATIVE Final    Comment:        The GeneXpert MRSA Assay (FDA approved for NASAL specimens only), is one component of a comprehensive MRSA colonization surveillance program. It is not intended to diagnose MRSA infection nor to guide or monitor treatment for MRSA infections.      Radiology Studies: Dg Chest Portable 1 View  Result Date: 09/27/2016 CLINICAL DATA:  83 y/o  F; shortness of breath on BiPAP. EXAM: PORTABLE CHEST 1 VIEW COMPARISON:  03/21/2016 chest radiograph FINDINGS: Diffuse patchy opacification of the lungs probably represents moderate to severe pulmonary edema. Moderate bilateral pleural effusions. Bibasilar opacities probably represent atelectasis. The cardiac silhouette is obscured by effusions and airspace disease. Aortic atherosclerosis with calcification. No acute osseous abnormality identified. IMPRESSION: Moderate to severe pulmonary edema and moderate bilateral pleural effusions. Underlying pneumonia is not excluded. Electronically Signed   By: Kristine Garbe M.D.   On: 09/27/2016 20:05      Scheduled Meds: . acetaminophen  650 mg Oral BID  . apixaban  5 mg Oral BID  . atorvastatin  20 mg Oral q1800  . chlorhexidine  15 mL Mouth Rinse BID  . diltiazem  120 mg Oral Daily  . diltiazem  180 mg Oral Daily  . furosemide  20 mg Intravenous BID  . gabapentin  100 mg Oral BID  . insulin aspart  0-9 Units Subcutaneous TID WC  . ipratropium-albuterol  3 mL Nebulization Q6H WA  . levothyroxine  25 mcg Oral QAC breakfast  . lisinopril  20 mg Oral Daily  . mouth rinse  15 mL Mouth Rinse q12n4p  . nitroGLYCERIN  1 inch Topical Once  . ondansetron  4 mg Oral BID  . potassium chloride SA  40 mEq Oral Daily   Continuous Infusions:   LOS: 2 days   Ayala Ribble, MD FACP Hospitalist.   If 7PM-7AM, please contact night-coverage [email or web address] Password [facility] 09/29/2016, 7:45 AM

## 2016-09-30 DIAGNOSIS — J9601 Acute respiratory failure with hypoxia: Secondary | ICD-10-CM

## 2016-09-30 DIAGNOSIS — I481 Persistent atrial fibrillation: Secondary | ICD-10-CM

## 2016-09-30 LAB — BASIC METABOLIC PANEL
ANION GAP: 6 (ref 5–15)
BUN: 15 mg/dL (ref 6–20)
CHLORIDE: 101 mmol/L (ref 101–111)
CO2: 29 mmol/L (ref 22–32)
Calcium: 8.3 mg/dL — ABNORMAL LOW (ref 8.9–10.3)
Creatinine, Ser: 0.67 mg/dL (ref 0.44–1.00)
GFR calc Af Amer: >60 mL/min (ref >60)
Glucose, Bld: 93 mg/dL (ref 65–99)
POTASSIUM: 4.3 mmol/L (ref 3.5–5.1)
SODIUM: 136 mmol/L (ref 135–145)

## 2016-09-30 LAB — GLUCOSE, CAPILLARY
GLUCOSE-CAPILLARY: 79 mg/dL (ref 65–99)
GLUCOSE-CAPILLARY: 161 mg/dL — AB (ref 65–99)

## 2016-09-30 MED ORDER — DILTIAZEM HCL ER COATED BEADS 120 MG PO CP24: 120.0000 mg | ORAL_CAPSULE | Freq: Every day | ORAL | 2 refills | Status: DC

## 2016-09-30 MED ORDER — ELIQUIS 5 MG PO TABS: 2.5000 mg | ORAL_TABLET | Freq: Two times a day (BID) | ORAL | 1 refills | Status: DC

## 2016-09-30 NOTE — Clinical Social Work Note (Addendum)
Clinical Social Work Assessment  Patient Details  Name: BERTIA HOLTHUS MRN: [id number] Date of Birth: [birth year]  Date of referral:  09/30/16               Reason for consult:  Discharge Planning                Permission sought to share information with:    Permission granted to share information::     Name::        Agency::     Relationship::     Contact Information:  Georgena Unsell, son, listed on chart  Housing/Transportation Living arrangements for the past 2 months:  [place] of Information:  Patient, Adult Children, Facility Patient Interpreter Needed:  None Criminal Activity/Legal Involvement Pertinent to Current Situation/Hospitalization:    Significant Relationships:  Adult Children Lives with:  Facility Resident Do you feel safe going back to the place where you live?  Yes Need for family participation in patient care:  Yes (Comment)  Care giving concerns: Son, Chrissie Noa, stated that sometimes it takes too long for facility staff to come when she pushes the call button at [place].  He stated that patient only gets a shower once a week, and he'd like it to be more frequently.  Mr. Imlay stated that patient's roommate's television is too loud and this intefers with patient being able to enjoy her television.  He stated that sometimes patinet does not get her medication at the facility until about 10 p.m. LCSW advised him to address these issues with facility administration.    Social Worker assessment / plan:   LCSW spoke with Debbie at [employer]. Patient has been at the facility for three years. Patient uses a wheel chair and receives assistance from staff with bathing and toileting.  Patient's family is supportive. Patient can return to the facility. Patient's son, Chrissie Noa, confirmed statements. He stated that someone visits with patient daily.  He stated that the family desired for patient to return to the facility. Patient stated that she felt safe in the  facility and denied that she had ever been handled in a rough manner. She stated that she desired to return to the facility at discharge.  LCSW advised Jackelyn Poling and Mr. Andreason that patient was discharging today and would be transported to the facility via [facility] due to oxygen needs.  LCSW signing off.   Employment status:  Retired Forensic scientist:  Information systems manager, Medicaid In [place] PT Recommendations:  Not assessed at this time Information / Referral to community resources:     Patient/Family's Response to care:  Family is agreeable for patient to return to [place].    Patient/Family's Understanding of and Emotional Response to Diagnosis, Current Treatment, and Prognosis:  Family understands patient's diagnosis, treatment and prognosis.   Emotional Assessment Appearance:  Appears stated age Attitude/Demeanor/Rapport:   (Cooperative/pleasant) Affect (typically observed):  Accepting Orientation:  Oriented to Self, Oriented to Place Alcohol / Substance use:  Not Applicable Psych involvement (Current and /or in the community):  No (Comment)  Discharge Needs  Concerns to be addressed:  Discharge Planning Concerns Readmission within the last 30 days:  No Current discharge risk:  None Barriers to Discharge:  No Barriers Identified   Ihor Gully, LCSW 09/30/2016, 11:20 AM

## 2016-09-30 NOTE — Care Management Note (Signed)
Case Management Note  Patient Details  Name: [name] MRN: [id number] Date of Birth: 07/26/1921   Expected Discharge Date:  09/30/16               Expected Discharge Plan:  [place]  In-House Referral:  Clinical Social Work  Discharge planning Services  CM Consult  Post Acute Care Choice:  NA Choice offered to:  NA  DME Arranged:    DME Agency:     HH Arranged:    [place] Agency:     Status of Service:  Completed, signed off  If discussed at [employer], dates discussed:    Additional Comments: Patient is from [place], discharging back today. CSW aware and making arrangements.  Joshua Zeringue, Chauncey [place], RN 09/30/2016, 10:57 AM

## 2016-09-30 NOTE — Progress Notes (Signed)
Patient is off BiPAP and not requiring use. Can be started if needed.

## 2016-09-30 NOTE — NC FL2 (Signed)
[place] LEVEL OF CARE SCREENING TOOL     IDENTIFICATION  Patient Name: [name] Birthdate: 06-07-1921 Sex: female Admission Date (Current Location): 09/27/2016  [facility] and [place] Number:  [employer] and Address:  [place] [address] [place]      Provider Number: [id number]  Attending Physician Name and Address:  Orvan Falconer, MD  Relative Name and Phone Number:       Current Level of Care: Hospital Recommended Level of Care: [place] Prior Approval Number:    Date Approved/Denied:   PASRR Number: [id number] ([id number])  Discharge Plan: SNF    Current Diagnoses: Patient Active Problem List   Diagnosis Date Noted  . Acute pulmonary edema with congestive heart failure ([place]) 09/27/2016  . SOB (shortness of breath)   . Lewy body dementia without behavioral disturbance   . Acute respiratory failure with hypoxia ([place]) 03/18/2016  . Hyponatremia 03/18/2016  . Acute pulmonary edema ([place]) 03/17/2016  . Acute CHF (congestive heart failure) ([place]) 03/17/2016  . Abdominal distention 04/13/2014  . Hip pain 04/12/2014  . Labral tear of hip, degenerative 04/12/2014  . Trochanteric bursitis of right hip 04/12/2014  . Encounter for therapeutic drug monitoring 10/06/2013  . Lower extremity edema 08/20/2011  . Arteriosclerotic cardiovascular disease (ASCVD)   . Carcinoma of breast ([place])   . Atrial fibrillation ([place])   . MVP (mitral valve prolapse)   . PVD (peripheral vascular disease) ([place])   . CVD (cerebrovascular disease)   . Thyroid activity decreased   . Osteoporosis   . Chronic anticoagulation 11/22/2010  . Type 2 diabetes mellitus ([place]) 11/21/2009  . Hyperlipidemia 11/21/2009  . Hypertension 11/21/2009  . Acute on chronic diastolic heart failure ([place]) 11/21/2009  . SPINAL STENOSIS, LUMBAR 09/05/2008    Orientation RESPIRATION BLADDER Height & Weight     Self, Place  O2 (3L) Incontinent  Weight: 112 lb 10.5 oz (51.1 kg) Height:  5\' 4"  (162.6 cm)  BEHAVIORAL SYMPTOMS/MOOD NEUROLOGICAL BOWEL NUTRITION STATUS      Incontinent Diet (Heart healthy/carb modified. Fluid restriction 1200 mL fluid. Low sodium heart healthy)  AMBULATORY STATUS COMMUNICATION OF NEEDS Skin   Total Care (wheelchair) Verbally Normal                       Personal Care Assistance Level of Assistance  Bathing, Dressing, Feeding Bathing Assistance: Maximum assistance Feeding assistance: Independent Dressing Assistance: Maximum assistance     Functional Limitations Info  Sight, Hearing, Speech Sight Info: Adequate Hearing Info: Adequate Speech Info: Adequate    SPECIAL CARE FACTORS FREQUENCY                       Contractures Contractures Info: Not present    Additional Factors Info  Code Status, Psychotropic Code Status Info: Full Code   Psychotropic Info: [place]         Current Medications (09/30/2016):  This is the current hospital active medication list Current Facility-Administered Medications  Medication Dose Route Frequency Provider Last Rate Last Dose  . acetaminophen (TYLENOL) tablet 650 mg  650 mg Oral BID Reubin Milan, MD   650 mg at 09/29/16 2104  . apixaban (ELIQUIS) tablet 5 mg  5 mg Oral BID Reubin Milan, MD   5 mg at 09/29/16 2104  . atorvastatin (LIPITOR) tablet 20 mg  20 mg Oral q1800 Reubin Milan, MD   20 mg at  09/29/16 1808  . chlorhexidine (PERIDEX) 0.12 % solution 15 mL  15 mL Mouth Rinse BID Reubin Milan, MD   15 mL at 09/29/16 2104  . diltiazem (CARDIZEM CD) 24 hr capsule 120 mg  120 mg Oral Daily Orvan Falconer, MD   120 mg at 09/29/16 [id number]  . furosemide (LASIX) injection 20 mg  20 mg Intravenous BID Reubin Milan, MD   20 mg at 09/30/16 0827  . gabapentin (NEURONTIN) capsule 100 mg  100 mg Oral BID Reubin Milan, MD   100 mg at 09/29/16 2104  . insulin aspart (novoLOG) injection 0-9 Units  0-9 Units Subcutaneous TID WC  Reubin Milan, MD   1 Units at 09/29/16 1236  . ipratropium-albuterol (DUONEB) 0.5-2.5 (3) MG/3ML nebulizer solution 3 mL  3 mL Nebulization Q6H WA Orvan Falconer, MD   3 mL at 09/30/16 0904  . levothyroxine (SYNTHROID, LEVOTHROID) tablet 25 mcg  25 mcg Oral QAC breakfast Reubin Milan, MD   25 mcg at 09/30/16 [phone]  . lisinopril (PRINIVIL,ZESTRIL) tablet 20 mg  20 mg Oral Daily Reubin Milan, MD   20 mg at 09/29/16 1032  . MEDLINE mouth rinse  15 mL Mouth Rinse q12n4p Reubin Milan, MD   15 mL at 09/29/16 1535  . nitroGLYCERIN (NITROGLYN) 2 % ointment 1 inch  1 inch Topical Once Reubin Milan, MD   Stopped at 09/27/16 2230  . ondansetron (ZOFRAN) tablet 4 mg  4 mg Oral BID Reubin Milan, MD   4 mg at 09/29/16 2104  . potassium chloride SA (K-DUR,KLOR-CON) CR tablet 40 mEq  40 mEq Oral Daily Reubin Milan, MD   40 mEq at 09/29/16 1032     Discharge Medications: Please see discharge summary for a list of discharge medications.  Relevant Imaging Results:  Relevant Lab Results:   Additional Information    Cristin Szatkowski, [name], LCSW

## 2016-09-30 NOTE — Discharge Summary (Signed)
Physician Discharge Summary  [name] [id number] DOB: May 17, 1921 DOA: 09/27/2016  PCP: Wende Neighbors, MD  Admit date: 09/27/2016 Discharge date: 09/30/2016  Admitted From: [facility]. Disposition:  [facility].  Recommendations for Outpatient Follow-up:  1. Follow up with PCP in 1 week.   Home Health: None.  Equipment/Devices: DME oxygen Discharge Condition: improved. HR is controlled.  No SOB.  Feeding herself.  CODE STATUS: FULL CODE.  Reconfirmed with son Chrissie Noa today.  Diet recommendation: Heart healthy.   Brief/Interim Summary: patient was admitted by Dr Olevia Bowens on Sep 27, 2016 for CHF and SOB.  As per his H and P:  " HPI: [name] is a 83 y.o. female with medical history significant of CAD, chronic atrial fibrillation, CHF with preserved LV function, cerebrovascular disease, peripheral vascular disease, mitral valve prolapse, breast cancer, chronic hoarseness,  type 2 diabetes, herpes zoster, hypertension, hypothyroidism, osteoporosis, diabetic peripheral neuropathy, osteoarthritis who was brought by EMS from her nursing home facility to the emergency department due to severe dyspnea associated with productive cough of whitish sputum. EMS had to place patient on supplemental oxygen with nonrebreathing mask during transport to the emergency department.  Per patient and her son, she has been feeling progressively weaker and tired over the past few days. She has had significant lower extremity edema over the past 2 weeks and her son stated that several days ago "she was leaking fluid from her legs". She has also had orthopnea. She denies noncompliance of medications, high fluid intake or sodium consumption indiscretion. She denies fever, chills, sore throat, chest pain, palpitations, diaphoresis, dizziness, abdominal pain, nausea, emesis, melena, hematochezia, diarrhea or constipation. She denies dysuria or frequency.  ED Course: The patient was given supplemental oxygen, furosemide 40  mg IVP 1 and placed on BiPAP ventilation. Her EKG showed atrial fibrillation with LVH, nonspecific ST and T wave abnormality. Troponin level was less than 0.03 ng/mL. BNP was 523 pg/mL. CBC 11.1, hemoglobin 14.1 g/dL and platelets 309. Sodium level CXXIX, potassium 5.0, chloride 96 and bicarbonate 24 mmol/L. Her BUN 23, creatinine 0.82, glucose 303, magnesium 2.0 and phosphorus 4.5 mg/dL. Her chest radiograph showed moderate to severe pulmonary edema with moderate bilateral pleural effusions (please see report for full details).  HOSPITAL COURSE:  Patient was admitted into the ICU, and she was started on Bipap, and given low dose, but IV Lasix.  She improved.   The following morning, she had some abdominal distention, and it was felt to be due to the Bipap, and it resolved promptly.  She was transferred to telemetry but has been boarding in the ICU. She was noted to have bradycardia with HR in the 30, asymptomatic, and her BB, Calcium CB, and Digitalis were held.  On admission, her dig level was 1.6.  Her HR started to pick up, and she was re-introduced with Cardiazem, after one dose of IV given.  She was retarted on lower dose at 120mg  per day.  With this, she did well.  Pharmacy was recommending with her age and weight, she really should be on Eliquis 2.5 instead of 5mg  BID, and therefore, she will be discharged on lower dose as well.  She was complaining of lower extremity pain, likely from neuropathy, and was given one dose of Percocet 2.5mg .  Though it worked well and no disaster happened, she was confused the following morning, so I discontinued it as well.  I called her son Chrissie Noa to update him, and medication changes, and informed him of  her discharge today.  We confirmed her code status as FULL CODE today as well.  She will follow up with her PCP and cardiology as recommended.  Thank you so much and Good day.   Discharge Diagnoses:  Principal Problem:   Acute pulmonary edema with congestive heart  failure (HCC) Active Problems:   Type 2 diabetes mellitus (HCC)   Hyperlipidemia   Hypertension   Atrial fibrillation (HCC)   Acute respiratory failure with hypoxia (HCC)   Hyponatremia    Discharge Instructions  Discharge Instructions    Diet - low sodium heart healthy    Complete by:  As directed    Increase activity slowly    Complete by:  As directed      Allergies as of 09/30/2016   No Known Allergies     Medication List    STOP taking these medications   acetaminophen 325 MG tablet Commonly known as:  TYLENOL   carbamide peroxide 6.5 % otic solution Commonly known as:  DEBROX   digoxin 0.125 MG tablet Commonly known as:  LANOXIN   diltiazem 300 MG 24 hr capsule Commonly known as:  TIAZAC   loratadine 10 MG tablet Commonly known as:  CLARITIN   nebivolol 10 MG tablet Commonly known as:  BYSTOLIC   SALONPAS EX   Vitamin D (Ergocalciferol) 50000 units Caps capsule Commonly known as:  DRISDOL     TAKE these medications   atorvastatin 20 MG tablet Commonly known as:  LIPITOR Take 20 mg by mouth daily.   calcium carbonate 500 MG chewable tablet Commonly known as:  TUMS - dosed in mg elemental calcium Chew 1 tablet by mouth 3 (three) times daily before meals.   diltiazem 120 MG 24 hr capsule Commonly known as:  CARDIZEM CD Take 1 capsule (120 mg total) by mouth daily.   ELIQUIS 5 MG Tabs tablet Generic drug:  apixaban Take 1 tablet (5 mg total) by mouth 2 (two) times daily.   furosemide 20 MG tablet Commonly known as:  LASIX Take 2 tablets (40 mg total) by mouth 2 (two) times daily.   gabapentin 100 MG capsule Commonly known as:  NEURONTIN Take 100 mg by mouth every 8 (eight) hours.   isosorbide mononitrate 30 MG 24 hr tablet Commonly known as:  IMDUR Take 0.5 tablets (15 mg total) by mouth daily.   levothyroxine 25 MCG tablet Commonly known as:  SYNTHROID, LEVOTHROID Take 25 mcg by mouth every evening.   lisinopril 20 MG  tablet Commonly known as:  PRINIVIL,ZESTRIL Take 1 tablet (20 mg total) by mouth daily.   metFORMIN 500 MG tablet Commonly known as:  GLUCOPHAGE Take 500 mg by mouth 2 (two) times daily with a meal.   oseltamivir 75 MG capsule Commonly known as:  TAMIFLU Take 75 mg by mouth daily. 5 DAY COURSE STARTING ON 09/27/2016   OXYGEN Inhale 2 L into the lungs continuous. *TO MAINTAIN O2 AT 94% OR GREATER   polyethylene glycol powder powder Commonly known as:  GLYCOLAX/MIRALAX Take 17 g by mouth every other day.   potassium chloride SA 20 MEQ tablet Commonly known as:  K-DUR,KLOR-CON Take 2 tablets (40 mEq total) by mouth daily. What changed:  how much to take  when to take this       No Known Allergies  Consultations:  None.    Procedures/Studies: Dg Chest Portable 1 View  Result Date: 09/27/2016 CLINICAL DATA:  83 y/o  F; shortness of breath on BiPAP. EXAM: PORTABLE  CHEST 1 VIEW COMPARISON:  03/21/2016 chest radiograph FINDINGS: Diffuse patchy opacification of the lungs probably represents moderate to severe pulmonary edema. Moderate bilateral pleural effusions. Bibasilar opacities probably represent atelectasis. The cardiac silhouette is obscured by effusions and airspace disease. Aortic atherosclerosis with calcification. No acute osseous abnormality identified. IMPRESSION: Moderate to severe pulmonary edema and moderate bilateral pleural effusions. Underlying pneumonia is not excluded. Electronically Signed   By: Kristine Garbe M.D.   On: 09/27/2016 20:05       Subjective: Doing well.    Discharge Exam: Vitals:   09/30/16 0714 09/30/16 0800  BP:  (!) 177/66  Pulse: 71 73  Resp: (!) 23 19  Temp: 98 F (36.7 C)    Vitals:   09/30/16 0700 09/30/16 0714 09/30/16 0800 09/30/16 0904  BP: (!) 159/93  (!) 177/66   Pulse: 74 71 73   Resp: 20 (!) 23 19   Temp:  98 F (36.7 C)    TempSrc:  Oral    SpO2: 100% 100% 93% 95%  Weight:      Height:         General: Pt is alert, awake, not in acute distress Cardiovascular: RRR, S1/S2 +, no rubs, no gallops Respiratory: CTA bilaterally, no wheezing, no rhonchi Abdominal: Soft, NT, ND, bowel sounds + Extremities: no edema, no cyanosis    The results of significant diagnostics from this hospitalization (including imaging, microbiology, ancillary and laboratory) are listed below for reference.     Microbiology: Recent Results (from the past 240 hour(s))  MRSA PCR Screening     Status: None   Collection Time: 09/27/16 10:01 PM  Result Value Ref Range Status   MRSA by PCR NEGATIVE NEGATIVE Final    Comment:        The GeneXpert MRSA Assay (FDA approved for NASAL specimens only), is one component of a comprehensive MRSA colonization surveillance program. It is not intended to diagnose MRSA infection nor to guide or monitor treatment for MRSA infections.      Labs: BNP (last 3 results)  Recent Labs  03/17/16 2005 09/27/16 1935  BNP 483.0* [id number]*   Basic Metabolic Panel:  Recent Labs Lab 09/27/16 1935 09/27/16 1937 09/28/16 0822 09/29/16 0452 09/30/16 0415  NA  --  129* 136 140 136  K  --  5.0 4.0 4.1 4.3  CL  --  96* 102 104 101  CO2  --  24 29 31 29   GLUCOSE  --  303* 125* 88 93  BUN  --  23* 18 17 15   CREATININE  --  0.82 0.71 0.73 0.67  CALCIUM  --  9.5 8.6* 8.4* 8.3*  MG 2.0  --   --   --   --   PHOS 4.5  --   --   --   --    CBC:  Recent Labs Lab 09/27/16 1937 09/28/16 0822  WBC 11.1* 5.2  NEUTROABS 8.9* 3.4  HGB 14.1 11.7*  HCT 44.0 36.3  MCV 84.1 83.8  PLT 309 209   Cardiac Enzymes:  Recent Labs Lab 09/28/16 0822 09/28/16 1309 09/28/16 2319 09/29/16 0452 09/29/16 1136  TROPONINI <0.03 <0.03 <0.03 <0.03 <0.03   BNP: Invalid input(s): POCBNP CBG:  Recent Labs Lab 09/29/16 0710 09/29/16 1137 09/29/16 1650 09/29/16 2111 09/30/16 0713  GLUCAP 79 141* 98 118* 79   D-Dimer No results for input(s): DDIMER in the last 72  hours. Hgb A1c  Recent Labs  09/28/16 0822  HGBA1C 7.4*  Thyroid function studies  Recent Labs  09/28/16 0822  TSH 1.288   Urinalysis    Component Value Date/Time   COLORURINE YELLOW 04/19/2014 1730   APPEARANCEUR CLEAR 04/19/2014 1730   LABSPEC <1.005 (L) 04/19/2014 1730   PHURINE 6.0 04/19/2014 1730   GLUCOSEU NEGATIVE 04/19/2014 1730   HGBUR NEGATIVE 04/19/2014 1730   BILIRUBINUR NEGATIVE 04/19/2014 1730   KETONESUR NEGATIVE 04/19/2014 1730   PROTEINUR NEGATIVE 04/19/2014 1730   UROBILINOGEN 0.2 04/19/2014 1730   NITRITE NEGATIVE 04/19/2014 1730   LEUKOCYTESUR NEGATIVE 04/19/2014 1730   Sepsis Labs Invalid input(s): PROCALCITONIN,  WBC,  LACTICIDVEN Microbiology Recent Results (from the past 240 hour(s))  MRSA PCR Screening     Status: None   Collection Time: 09/27/16 10:01 PM  Result Value Ref Range Status   MRSA by PCR NEGATIVE NEGATIVE Final    Comment:        The GeneXpert MRSA Assay (FDA approved for NASAL specimens only), is one component of a comprehensive MRSA colonization surveillance program. It is not intended to diagnose MRSA infection nor to guide or monitor treatment for MRSA infections.      Time coordinating discharge: Over 30 minutes  SIGNED:   Orvan Falconer, MD FACP [facility] 09/30/2016, 9:24 AM   If 7PM-7AM, please contact night-coverage [email or web address] Password [email or web address]

## 2016-11-07 ENCOUNTER — Ambulatory Visit: Payer: Medicare Other | Admitting: Adult Health

## 2016-11-14 ENCOUNTER — Encounter: Payer: Self-pay | Admitting: Adult Health

## 2016-11-14 ENCOUNTER — Ambulatory Visit (INDEPENDENT_AMBULATORY_CARE_PROVIDER_SITE_OTHER): Payer: Medicare Other | Admitting: Adult Health

## 2016-11-14 VITALS — BP 132/78 | HR 87 | Ht 62.0 in | Wt 114.0 lb

## 2016-11-14 DIAGNOSIS — I10 Essential (primary) hypertension: Secondary | ICD-10-CM

## 2016-11-14 DIAGNOSIS — I482 Chronic atrial fibrillation: Secondary | ICD-10-CM

## 2016-11-14 DIAGNOSIS — I4821 Permanent atrial fibrillation: Secondary | ICD-10-CM

## 2016-11-14 DIAGNOSIS — I5032 Chronic diastolic (congestive) heart failure: Secondary | ICD-10-CM | POA: Diagnosis not present

## 2016-11-14 DIAGNOSIS — E785 Hyperlipidemia, unspecified: Secondary | ICD-10-CM | POA: Diagnosis not present

## 2016-11-14 NOTE — Progress Notes (Signed)
Name: [name]    DOB: 05-20-1921  Age: 83 y.o.  MR#: [id number]       PCP:  Wende Neighbors, MD      Insurance: Payor: [employer] MEDICARE / Plan: [facility] MEDICARE / Product Type: *No Product type* /   CC:   No chief complaint on file.   VS Vitals:   11/14/16 1331  Pulse: 87  SpO2: 97%  Weight: 114 lb (51.7 kg)  Height: 5\' 2"  (1.575 m)    Weights Current Weight  11/14/16 114 lb (51.7 kg)  09/29/16 112 lb 10.5 oz (51.1 kg)  08/08/16 117 lb (53.1 kg)    Blood Pressure  BP Readings from Last 3 Encounters:  09/30/16 (!) 155/74  08/08/16 130/64  04/29/16 140/70     Admit date:  (Not on file) Last encounter with RMR:  Visit date not found   Allergy Patient has no known allergies.  Current Outpatient Prescriptions  Medication Sig Dispense Refill  . atorvastatin (LIPITOR) 20 MG tablet Take 20 mg by mouth daily.    . calcium carbonate (TUMS - DOSED IN MG ELEMENTAL CALCIUM) 500 MG chewable tablet Chew 1 tablet by mouth 3 (three) times daily before meals.    Marland Kitchen diltiazem (CARDIZEM CD) 120 MG 24 hr capsule Take 1 capsule (120 mg total) by mouth daily. 30 capsule 2  . ELIQUIS 5 MG TABS tablet Take 1 tablet (5 mg total) by mouth 2 (two) times daily. 60 tablet 1  . furosemide (LASIX) 20 MG tablet Take 2 tablets (40 mg total) by mouth 2 (two) times daily.    Marland Kitchen gabapentin (NEURONTIN) 100 MG capsule Take 100 mg by mouth every 8 (eight) hours.     . isosorbide mononitrate (IMDUR) 30 MG 24 hr tablet Take 0.5 tablets (15 mg total) by mouth daily.    Marland Kitchen levothyroxine (SYNTHROID, LEVOTHROID) 25 MCG tablet Take 25 mcg by mouth every evening.     Marland Kitchen lisinopril (PRINIVIL,ZESTRIL) 20 MG tablet Take 1 tablet (20 mg total) by mouth daily.    . metFORMIN (GLUCOPHAGE) 500 MG tablet Take 500 mg by mouth 2 (two) times daily with a meal.    . polyethylene glycol powder (GLYCOLAX/MIRALAX) powder Take 17 g by mouth every other day.    . potassium chloride SA (K-DUR,KLOR-CON) 20 MEQ tablet Take 2 tablets (40  mEq total) by mouth daily. (Patient taking differently: Take 20 mEq by mouth 2 (two) times daily. )     No current facility-administered medications for this visit.     Discontinued Meds:    Medications Discontinued During This Encounter  Medication Reason  . oseltamivir (TAMIFLU) 75 MG capsule Error  . OXYGEN Error    Patient Active Problem List   Diagnosis Date Noted  . Acute pulmonary edema with congestive heart failure ([place]) 09/27/2016  . SOB (shortness of breath)   . Lewy body dementia without behavioral disturbance   . Acute respiratory failure with hypoxia ([place]) 03/18/2016  . Hyponatremia 03/18/2016  . Acute pulmonary edema ([place]) 03/17/2016  . Acute CHF (congestive heart failure) ([place]) 03/17/2016  . Abdominal distention 04/13/2014  . Hip pain 04/12/2014  . Labral tear of hip, degenerative 04/12/2014  . Trochanteric bursitis of right hip 04/12/2014  . Encounter for therapeutic drug monitoring 10/06/2013  . Lower extremity edema 08/20/2011  . Arteriosclerotic cardiovascular disease (ASCVD)   . Carcinoma of breast ([place])   . Atrial fibrillation ([place])   . MVP (mitral valve prolapse)   . PVD (  peripheral vascular disease) ([place])   . CVD (cerebrovascular disease)   . Thyroid activity decreased   . Osteoporosis   . Chronic anticoagulation 11/22/2010  . Type 2 diabetes mellitus ([place]) 11/21/2009  . Hyperlipidemia 11/21/2009  . Hypertension 11/21/2009  . Acute on chronic diastolic heart failure ([place]) 11/21/2009  . SPINAL STENOSIS, LUMBAR 09/05/2008    LABS    Component Value Date/Time   NA 136 09/30/2016 0415   NA 140 09/29/2016 0452   NA 136 09/28/2016 0822   K 4.3 09/30/2016 0415   K 4.1 09/29/2016 0452   K 4.0 09/28/2016 0822   CL 101 09/30/2016 0415   CL 104 09/29/2016 0452   CL 102 09/28/2016 0822   CO2 29 09/30/2016 0415   CO2 31 09/29/2016 0452   CO2 29 09/28/2016 0822   GLUCOSE 93 09/30/2016 0415   GLUCOSE 88 09/29/2016 0452   GLUCOSE 125 (H) 09/28/2016  0822   BUN 15 09/30/2016 0415   BUN 17 09/29/2016 0452   BUN 18 09/28/2016 0822   CREATININE 0.67 09/30/2016 0415   CREATININE 0.73 09/29/2016 0452   CREATININE 0.71 09/28/2016 0822   CREATININE 0.79 03/18/2011 1510   CALCIUM 8.3 (L) 09/30/2016 0415   CALCIUM 8.4 (L) 09/29/2016 0452   CALCIUM 8.6 (L) 09/28/2016 0822   GFRNONAA >60 09/30/2016 0415   GFRNONAA >60 09/29/2016 0452   GFRNONAA >60 09/28/2016 0822   GFRAA >60 09/30/2016 0415   GFRAA >60 09/29/2016 0452   GFRAA >60 09/28/2016 0822   CMP     Component Value Date/Time   NA 136 09/30/2016 0415   K 4.3 09/30/2016 0415   CL 101 09/30/2016 0415   CO2 29 09/30/2016 0415   GLUCOSE 93 09/30/2016 0415   BUN 15 09/30/2016 0415   CREATININE 0.67 09/30/2016 0415   CREATININE 0.79 03/18/2011 1510   CALCIUM 8.3 (L) 09/30/2016 0415   PROT 6.1 04/19/2014 1709   ALBUMIN 3.0 (L) 04/19/2014 1709   AST 12 04/19/2014 1709   ALT 16 04/19/2014 1709   ALKPHOS 60 04/19/2014 1709   BILITOT 0.3 04/19/2014 1709   GFRNONAA >60 09/30/2016 0415   GFRAA >60 09/30/2016 0415       Component Value Date/Time   WBC 5.2 09/28/2016 0822   WBC 11.1 (H) 09/27/2016 1937   WBC 12.4 (H) 03/17/2016 2005   HGB 11.7 (L) 09/28/2016 0822   HGB 14.1 09/27/2016 1937   HGB 13.8 03/17/2016 2005   HCT 36.3 09/28/2016 0822   HCT 44.0 09/27/2016 1937   HCT 42.1 03/17/2016 2005   MCV 83.8 09/28/2016 0822   MCV 84.1 09/27/2016 1937   MCV 87.2 03/17/2016 2005    Lipid Panel     Component Value Date/Time   CHOL  10/19/2010 0615    120        ATP III CLASSIFICATION:  <200     mg/dL   Desirable  200-239  mg/dL   Borderline High  >=240    mg/dL   High          TRIG 74 10/19/2010 0615   HDL 39 (L) 10/19/2010 0615   CHOLHDL 3.1 10/19/2010 0615   VLDL 15 10/19/2010 0615   [place]  10/19/2010 0615    66        Total Cholesterol/HDL:CHD Risk Coronary Heart Disease Risk Table                     Men   Women  1/2 Average  Risk   3.4   3.3  Average Risk        5.0   4.4  2 X Average Risk   9.6   7.1  3 X Average Risk  23.4   11.0        Use the calculated Patient Ratio above and the CHD Risk Table to determine the patient's CHD Risk.        ATP III CLASSIFICATION (LDL):  <100     mg/dL   Optimal  100-129  mg/dL   Near or Above                    Optimal  130-159  mg/dL   Borderline  160-189  mg/dL   High  >190     mg/dL   Very High    ABG No results found for: PHART, PCO2ART, PO2ART, HCO3, TCO2, ACIDBASEDEF, O2SAT   Lab Results  Component Value Date   TSH 1.288 09/28/2016   BNP (last 3 results)  Recent Labs  03/17/16 2005 09/27/16 1935  BNP 483.0* 523.0*    ProBNP (last 3 results) No results for input(s): PROBNP in the last 8760 hours.  Cardiac Panel (last 3 results) No results for input(s): CKTOTAL, CKMB, TROPONINI, RELINDX in the last 72 hours.  Iron/TIBC/Ferritin/ %Sat    Component Value Date/Time   FERRITIN 38 05/01/2007 1318     EKG Orders placed or performed during the hospital encounter of 09/27/16  . EKG 12-Lead  . EKG 12-Lead  . ED EKG  . ED EKG  . EKG     Prior Assessment and Plan Problem List as of 11/14/2016 Reviewed: 09/27/2016  8:45 PM by Reubin Milan, MD     Cardiovascular and Mediastinum   Hypertension   Last Assessment & Plan 04/07/2013 Office Visit Written 04/07/2013  7:35 PM by Yehuda [place], MD    Control of hypertension is inadequate. Dose of losartan will be increased 100 mg per day. Terazosin will be added to medical regime and titrated during continued monitoring of BP.      Acute on chronic diastolic heart failure [facility])   Last Assessment & Plan 03/18/2011 Office Visit Written 03/18/2011  5:05 PM by Yehuda [place], MD    No apparent active congestive heart failure at the present time.      Arteriosclerotic cardiovascular disease (ASCVD)   Last Assessment & Plan 04/07/2013 Office Visit Edited 04/07/2013  7:34 PM by Yehuda [place], MD    No clinical evidence for progression  of disease or significant ischemia for at least a decade; we will continue to attempt to optimize treatment of cardiovascular risk factors.    Rx with aspirin is not known to be beneficial in patients fully anticoagulated with warfarin; accordingly, aspirin will be discontinued.      Atrial fibrillation [facility])   Last Assessment & Plan 04/07/2013 Office Visit Written 04/07/2013  7:28 PM by Yehuda [place], MD    Patient is asymptomatic with respect to arrhythmia. Heart rate control is slightly excessive. Diltiazem dose will be decreased to 180 mg per day with next prescription.      MVP (mitral valve prolapse)   PVD (peripheral vascular disease) (HCC)   CVD (cerebrovascular disease)   Acute CHF (congestive heart failure) (HCC)     Respiratory   Acute pulmonary edema (HCC)   Acute respiratory failure with hypoxia (HCC)   Acute pulmonary edema with congestive heart failure ([place])  Endocrine   Type 2 diabetes mellitus (HCC)   Thyroid activity decreased     Nervous and Auditory   Lewy body dementia without behavioral disturbance     Musculoskeletal and Integument   Osteoporosis   Labral tear of hip, degenerative   Trochanteric bursitis of right hip     Other   Hyperlipidemia   Last Assessment & Plan 04/07/2013 Office Visit Written 04/07/2013  7:35 PM by Yehuda [place], MD    She reports taking rosuvastatin only approximately 3 days per week, but this appears to be providing adequate control of hyperlipidemia based upon a recent profile.      SPINAL STENOSIS, LUMBAR   Last Assessment & Plan 03/18/2011 Office Visit Written 03/18/2011  5:13 PM by Yehuda [place], MD    Ms. Mancias has chronic back pain with left leg discomfort as well, which may be related to degenerative joint disease of the lumbosacral spine.  She's been taking Tylenol without much relief.  She is worried about taking nonsteroidals in the setting of full anticoagulation.  I provided her with a prescription for Ultracet.   If this is ineffective, I would encourage her to use nonsteroidals at the lowest effective dose once or twice a day.      Chronic anticoagulation   Last Assessment & Plan 04/07/2013 Office Visit Edited 04/07/2013  7:30 PM by Yehuda [place], MD    She has done remarkably well with chronic anticoagulation.  Consideration can be given to substitution of a novel oral anticoagulant, but in consideration of her uncomplicated therapy with warfarin to date, we will continue use of this agent and include careful monitoring of the level of anticoagulation, serial CBCs and stool Hemoccult testing.      Carcinoma of breast ([place])   Lower extremity edema   Encounter for therapeutic drug monitoring   Hip pain   Abdominal distention   Hyponatremia   SOB (shortness of breath)       Imaging: No results found.

## 2016-11-14 NOTE — Patient Instructions (Signed)
Medication Instructions:  Your physician recommends that you continue on your current medications as directed. Please refer to the Current Medication list given to you today.  Labwork: Your physician recommends that you return for lab work in: [place] BMET    Testing/Procedures: NONE   Follow-Up: Your physician recommends that you schedule a follow-up appointment in: 3 MONTHS    Any Other Special Instructions Will Be Listed Below (If Applicable). LOW SODIUM DIET   PLEASE SEE PHYSICIAN'S ORDER SHEET    If you need a refill on your cardiac medications before your next appointment, please call your pharmacy.

## 2016-11-14 NOTE — Progress Notes (Signed)
Cardiology Office Note   Date:  11/14/2016   ID:  [name], DOB 29-May-1921, MRN [id number]  PCP:  Wende Neighbors, MD  Cardiologist: Purvis Sheffield, NP   No chief complaint on file.     History of Present Illness: [name] is a 83 y.o. female who presents for ongoing assessment and management of PAF, CAD, Diastolic CHF, PVD, who was last seen by Dr. Harrington Challenger on 08/08/2017. At that time, there was some evidence of fluid overload. Salt restriction was recommended. Follow up labs were completed.   Unfortunately the patient was admitted to [facility] in the setting of decompensated CHF, pulmonary edema, in February of 2018, with need for BiPAP, IV diureses. She was found to be bradycardic Diltiazem was continued for Afib at lower dose of 120 mg daily.. Eliquis was decreased to 2.5 mg BID due to age and weight. Lasix was continued at 20 mg daily. Discharge weight 112 lbs. She was discharged to [facility].  She is not expressing any symptoms today. She is wheelchair bound and often has legs in dependent position. She is cared for by [facility] staff. Has not restricted salt.      Past Medical History:  Diagnosis Date  . Arteriosclerotic cardiovascular disease (ASCVD)    cath in 2001- 40% LAD and 50% RCA; stent for 80% LAD in 5/04; residual 50% LAD, 80% small D1, 70% small OM1 50% ostial RCA, nl EF   . Atrial fibrillation ([place]) 2007   CHF with preserved LV systolic function - 5397; moderate LVH  . Bronchitis    history  . Carcinoma of breast ([place])    left masectomy in 1995  . Chronic diastolic heart failure ([place]) 11/21/2009  . Chronic hoarseness   . CVD (cerebrovascular disease)    plaque w/o focal disease in 2006; h/o CVA  . Diabetes mellitus type II    no insulin   . Edema   . Herpes zoster   . Hypertension   . Hypothyroid   . Lower extremity edema 08/20/2011  . MVP (mitral valve prolapse)    moderate; with moderate MR  . Osteoporosis   . Peripheral neuropathy ([place])   . PVD (peripheral  vascular disease) (HCC)    ABIs of 0.64 and 0.59, right and left leg in 2009  . 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 Shingles   . Ulcer ([place])    left lower leg  . Varicose veins of legs 08/20/2011    Past Surgical History:  Procedure Laterality Date  . ABDOMINAL HYSTERECTOMY     abd?  . CATARACT EXTRACTION, BILATERAL    . CHOLECYSTECTOMY  2006  . COLONOSCOPY  Date unknown  . CORONARY ANGIOPLASTY WITH STENT PLACEMENT    . left masectomy  1995  . ORIF of left hip  05/22/06   [name]     Current Outpatient Prescriptions  Medication Sig Dispense Refill  . atorvastatin (LIPITOR) 20 MG tablet Take 20 mg by mouth daily.    . calcium carbonate (TUMS - DOSED IN MG ELEMENTAL CALCIUM) 500 MG chewable tablet Chew 1 tablet by mouth 3 (three) times daily before meals.    Marland Kitchen diltiazem (CARDIZEM CD) 120 MG 24 hr capsule Take 1 capsule (120 mg total) by mouth daily. 30 capsule 2  . ELIQUIS 5 MG TABS tablet Take 1 tablet (5 mg total) by mouth 2 (two) times daily. 60 tablet 1  . furosemide (LASIX) 20 MG tablet Take 2 tablets (40 mg total) by mouth  2 (two) times daily.    Marland Kitchen gabapentin (NEURONTIN) 100 MG capsule Take 100 mg by mouth every 8 (eight) hours.     . isosorbide mononitrate (IMDUR) 30 MG 24 hr tablet Take 0.5 tablets (15 mg total) by mouth daily.    Marland Kitchen levothyroxine (SYNTHROID, LEVOTHROID) 25 MCG tablet Take 25 mcg by mouth every evening.     Marland Kitchen lisinopril (PRINIVIL,ZESTRIL) 20 MG tablet Take 1 tablet (20 mg total) by mouth daily.    . metFORMIN (GLUCOPHAGE) 500 MG tablet Take 500 mg by mouth 2 (two) times daily with a meal.    . polyethylene glycol powder (GLYCOLAX/MIRALAX) powder Take 17 g by mouth every other day.    . potassium chloride SA (K-DUR,KLOR-CON) 20 MEQ tablet Take 2 tablets (40 mEq total) by mouth daily. (Patient taking differently: Take 20 mEq by mouth 2 (two) times daily. )     No current facility-administered medications for this visit.     Allergies:   Patient has no known  allergies.    Social History:  The patient  reports that she has never smoked. She has never used smokeless tobacco. She reports that she does not drink alcohol or use drugs.   Family History:  The patient's family history includes Diabetes in her mother.    ROS: All other systems are reviewed and negative. Unless otherwise mentioned in H&P    PHYSICAL EXAM: VS:  BP 132/78   Pulse 87   Ht 5\' 2"  (1.575 m)   Wt 114 lb (51.7 kg)   SpO2 97%   BMI 20.85 kg/m  , BMI Body mass index is 20.85 kg/m. GEN: Well nourished, well developed, in no acute distress  HEENT: normal  Neck: no JVD, carotid bruits, or masses Cardiac: IRRR; 2/6 systolic murmurs, rubs, or gallops, 1+ dependent edema  Respiratory:  clear to auscultation bilaterally, normal work of breathing GI: soft, nontender, nondistended, + BS MS: no deformity or atrophy  Skin: warm and dry, no rash Neuro:  Strength and sensation are intact Psych: euthymic mood, full affect   Recent Labs: 09/27/2016: B Natriuretic Peptide 523.0; Magnesium 2.0 09/28/2016: Hemoglobin 11.7; Platelets 209; TSH 1.288 09/30/2016: BUN 15; Creatinine, Ser 0.67; Potassium 4.3; Sodium 136    Lipid Panel    Component Value Date/Time   CHOL  10/19/2010 0615    120        ATP III CLASSIFICATION:  <200     mg/dL   Desirable  200-239  mg/dL   Borderline High  >=240    mg/dL   High          TRIG 74 10/19/2010 0615   HDL 39 (L) 10/19/2010 0615   CHOLHDL 3.1 10/19/2010 0615   VLDL 15 10/19/2010 0615   LDLCALC  10/19/2010 0615    66        Total Cholesterol/HDL:CHD Risk Coronary Heart Disease Risk Table                     Men   Women  1/2 Average Risk   3.4   3.3  Average Risk       5.0   4.4  2 X Average Risk   9.6   7.1  3 X Average Risk  23.4   11.0        Use the calculated Patient Ratio above and the CHD Risk Table to determine the patient's CHD Risk.        ATP III CLASSIFICATION (LDL):  <  100     mg/dL   Optimal  100-129  mg/dL   Near  or Above                    Optimal  130-159  mg/dL   Borderline  160-189  mg/dL   High  >190     mg/dL   Very High      Wt Readings from Last 3 Encounters:  11/14/16 114 lb (51.7 kg)  09/29/16 112 lb 10.5 oz (51.1 kg)  08/08/16 117 lb (53.1 kg)      Other studies Reviewed:  03/17/2016 Left ventricle: The cavity size was normal. There was focal basal   hypertrophy. Systolic function was normal. The estimated ejection   fraction was in the range of 60% to 65%. Wall motion was normal;   there were no regional wall motion abnormalities. - Aortic valve: Moderately calcified annulus. Trileaflet; mildly   thickened leaflets. Valve area (VTI): 1.55 cm^2. Valve area   (Vmax): 1.4 cm^2. Valve area (Vmean): 1.39 cm^2. - Mitral valve: Moderately calcified annulus. Mildly thickened   leaflets . There was mild regurgitation. - Left atrium: The atrium was massively dilated. - Right atrium: The atrium was severely dilated. - Atrial septum: No defect or patent foramen ovale was identified. - Pulmonary arteries: Systolic pressure was moderately increased.   PA peak pressure: 47 mm Hg (S). - Pericardium, extracardiac: There is a large left pleural   effusion. There is a small circumferential pericardial effusion. - Technically adequate study.  ASSESSMENT AND PLAN:  1.  Atrial fib: Heart rate is well controlled currently and she is tolerating diltiazem without complaints of tachycardia, bradycardia, dizziness. She does have some dependent edema. I have asked her to keep her feet elevated as much as possible as she is wheel chair bound. Low sodium diet. Eliquis as directed. Follow up BMET and CBC.   2. Chronic diastolic CHF: Weight has been stable. Continue diuretics, lasix 20 mg BID. Labs have been reviewed. No changes at this time. Low sodium diet.   3. Hypercholesterolemia; Continue statin therapy.    Current medicines are reviewed at length with the patient today.    Labs/ tests ordered  today include:   Orders Placed This Encounter  Procedures  . CBC with Differential  . Basic Metabolic Panel (BMET)     Disposition:   FU with cardiology in 3 months.   Signed, Jory Sims, NP  11/14/2016 3:22 PM    [facility] [address] [place], [place] [address] Phone: [phone]; Fax: [phone]

## 2016-12-10 ENCOUNTER — Inpatient Hospital Stay (HOSPITAL_COMMUNITY)
Admission: EM | Admit: 2016-12-10 | Discharge: 2016-12-11 | DRG: 309 | Disposition: A | Discharge status: Skilled Nursing Facility | Payer: Medicare Other | Attending: Internal Medicine | Admitting: Internal Medicine

## 2016-12-10 ENCOUNTER — Encounter (HOSPITAL_COMMUNITY): Payer: Self-pay | Admitting: Emergency Medicine

## 2016-12-10 ENCOUNTER — Emergency Department (HOSPITAL_COMMUNITY): Payer: Medicare Other

## 2016-12-10 DIAGNOSIS — I482 Chronic atrial fibrillation: Principal | ICD-10-CM | POA: Diagnosis present

## 2016-12-10 DIAGNOSIS — Z853 Personal history of malignant neoplasm of breast: Secondary | ICD-10-CM | POA: Diagnosis not present

## 2016-12-10 DIAGNOSIS — R6510 Systemic inflammatory response syndrome (SIRS) of non-infectious origin without acute organ dysfunction: Secondary | ICD-10-CM | POA: Diagnosis present

## 2016-12-10 DIAGNOSIS — Z79899 Other long term (current) drug therapy: Secondary | ICD-10-CM | POA: Diagnosis not present

## 2016-12-10 DIAGNOSIS — Z7901 Long term (current) use of anticoagulants: Secondary | ICD-10-CM

## 2016-12-10 DIAGNOSIS — Z955 Presence of coronary angioplasty implant and graft: Secondary | ICD-10-CM

## 2016-12-10 DIAGNOSIS — I5032 Chronic diastolic (congestive) heart failure: Secondary | ICD-10-CM | POA: Diagnosis present

## 2016-12-10 DIAGNOSIS — E871 Hypo-osmolality and hyponatremia: Secondary | ICD-10-CM | POA: Diagnosis not present

## 2016-12-10 DIAGNOSIS — Z7984 Long term (current) use of oral hypoglycemic drugs: Secondary | ICD-10-CM | POA: Diagnosis not present

## 2016-12-10 DIAGNOSIS — E1151 Type 2 diabetes mellitus with diabetic peripheral angiopathy without gangrene: Secondary | ICD-10-CM | POA: Diagnosis present

## 2016-12-10 DIAGNOSIS — I10 Essential (primary) hypertension: Secondary | ICD-10-CM | POA: Diagnosis not present

## 2016-12-10 DIAGNOSIS — G3183 Dementia with Lewy bodies: Secondary | ICD-10-CM | POA: Diagnosis present

## 2016-12-10 DIAGNOSIS — R7989 Other specified abnormal findings of blood chemistry: Secondary | ICD-10-CM | POA: Diagnosis present

## 2016-12-10 DIAGNOSIS — I4891 Unspecified atrial fibrillation: Secondary | ICD-10-CM | POA: Diagnosis present

## 2016-12-10 DIAGNOSIS — I2510 Atherosclerotic heart disease of native coronary artery without angina pectoris: Secondary | ICD-10-CM | POA: Diagnosis not present

## 2016-12-10 DIAGNOSIS — I5033 Acute on chronic diastolic (congestive) heart failure: Secondary | ICD-10-CM

## 2016-12-10 DIAGNOSIS — Z833 Family history of diabetes mellitus: Secondary | ICD-10-CM

## 2016-12-10 DIAGNOSIS — M810 Age-related osteoporosis without current pathological fracture: Secondary | ICD-10-CM | POA: Diagnosis present

## 2016-12-10 DIAGNOSIS — F0280 Dementia in other diseases classified elsewhere without behavioral disturbance: Secondary | ICD-10-CM | POA: Diagnosis present

## 2016-12-10 DIAGNOSIS — I110 Hypertensive heart disease with heart failure: Secondary | ICD-10-CM | POA: Diagnosis present

## 2016-12-10 DIAGNOSIS — E119 Type 2 diabetes mellitus without complications: Secondary | ICD-10-CM

## 2016-12-10 DIAGNOSIS — I248 Other forms of acute ischemic heart disease: Secondary | ICD-10-CM | POA: Diagnosis not present

## 2016-12-10 DIAGNOSIS — R748 Abnormal levels of other serum enzymes: Secondary | ICD-10-CM | POA: Diagnosis not present

## 2016-12-10 DIAGNOSIS — E1142 Type 2 diabetes mellitus with diabetic polyneuropathy: Secondary | ICD-10-CM | POA: Diagnosis not present

## 2016-12-10 DIAGNOSIS — R509 Fever, unspecified: Secondary | ICD-10-CM

## 2016-12-10 DIAGNOSIS — R778 Other specified abnormalities of plasma proteins: Secondary | ICD-10-CM | POA: Diagnosis present

## 2016-12-10 LAB — CBC WITH DIFFERENTIAL/PLATELET
BASOS PCT: 0 %
Basophils Absolute: 0 10*3/uL (ref 0.0–0.1)
EOS ABS: 0 10*3/uL (ref 0.0–0.7)
Eosinophils Relative: 0 %
HCT: 38.6 % (ref 36.0–46.0)
Hemoglobin: 12.5 g/dL (ref 12.0–15.0)
Lymphocytes Relative: 9 %
Lymphs Abs: 0.7 10*3/uL (ref 0.7–4.0)
MCH: 26.7 pg (ref 26.0–34.0)
MCHC: 32.4 g/dL (ref 30.0–36.0)
MCV: 82.3 fL (ref 78.0–100.0)
MONO ABS: 0.4 10*3/uL (ref 0.1–1.0)
MONOS PCT: 5 %
Neutro Abs: 6.7 10*3/uL (ref 1.7–7.7)
Neutrophils Relative %: 86 %
Platelets: 227 10*3/uL (ref 150–400)
RBC: 4.69 MIL/uL (ref 3.87–5.11)
RDW: 16.8 % — AB (ref 11.5–15.5)
WBC: 7.8 10*3/uL (ref 4.0–10.5)

## 2016-12-10 LAB — URINALYSIS, ROUTINE W REFLEX MICROSCOPIC
BILIRUBIN URINE: NEGATIVE
Glucose, UA: NEGATIVE mg/dL
HGB URINE DIPSTICK: NEGATIVE
KETONES UR: NEGATIVE mg/dL
Nitrite: NEGATIVE
PH: 5 (ref 5.0–8.0)
Protein, ur: 30 mg/dL — AB
SPECIFIC GRAVITY, URINE: 1.014 (ref 1.005–1.030)

## 2016-12-10 LAB — TSH: TSH: 0.465 u[IU]/mL (ref 0.350–4.500)

## 2016-12-10 LAB — COMPREHENSIVE METABOLIC PANEL
ALK PHOS: 91 U/L (ref 38–126)
ALT: 40 U/L (ref 14–54)
AST: 49 U/L — ABNORMAL HIGH (ref 15–41)
Albumin: 3.5 g/dL (ref 3.5–5.0)
Anion gap: 9 (ref 5–15)
BILIRUBIN TOTAL: 0.9 mg/dL (ref 0.3–1.2)
BUN: 21 mg/dL — ABNORMAL HIGH (ref 6–20)
CALCIUM: 8.9 mg/dL (ref 8.9–10.3)
CO2: 22 mmol/L (ref 22–32)
CREATININE: 0.88 mg/dL (ref 0.44–1.00)
Chloride: 101 mmol/L (ref 101–111)
GFR calc non Af Amer: 55 mL/min — ABNORMAL LOW (ref >60)
GLUCOSE: 185 mg/dL — AB (ref 65–99)
Potassium: 4.5 mmol/L (ref 3.5–5.1)
SODIUM: 132 mmol/L — AB (ref 135–145)
TOTAL PROTEIN: 6.6 g/dL (ref 6.5–8.1)

## 2016-12-10 LAB — TROPONIN I: TROPONIN I: 0.12 ng/mL — AB (ref <0.03)

## 2016-12-10 LAB — GLUCOSE, CAPILLARY: Glucose-Capillary: 185 mg/dL — ABNORMAL HIGH (ref 65–99)

## 2016-12-10 LAB — PROCALCITONIN: Procalcitonin: 0.12 ng/mL

## 2016-12-10 LAB — I-STAT CG4 LACTIC ACID, ED: Lactic Acid, Venous: 1.3 mmol/L (ref 0.5–1.9)

## 2016-12-10 MED ORDER — ISOSORBIDE MONONITRATE ER 30 MG PO TB24
15.0000 mg | ORAL_TABLET | Freq: Every day | ORAL | Status: DC
  Administered 2016-12-11: 15 mg via ORAL
  Filled 2016-12-10 (×4): qty 1

## 2016-12-10 MED ORDER — DILTIAZEM HCL-DEXTROSE 100-5 MG/100ML-% IV SOLN (PREMIX)
5.0000 mg/h | INTRAVENOUS | Status: DC
  Administered 2016-12-11: 5 mg/h via INTRAVENOUS
  Filled 2016-12-10: qty 100

## 2016-12-10 MED ORDER — ALPRAZOLAM 0.5 MG PO TABS
0.2500 mg | ORAL_TABLET | Freq: Two times a day (BID) | ORAL | Status: DC | PRN
  Administered 2016-12-10: 0.25 mg via ORAL
  Filled 2016-12-10: qty 1

## 2016-12-10 MED ORDER — ATORVASTATIN CALCIUM 20 MG PO TABS
20.0000 mg | ORAL_TABLET | Freq: Every day | ORAL | Status: DC
  Administered 2016-12-11: 20 mg via ORAL
  Filled 2016-12-10: qty 1

## 2016-12-10 MED ORDER — GABAPENTIN 100 MG PO CAPS
200.0000 mg | ORAL_CAPSULE | Freq: Three times a day (TID) | ORAL | Status: DC
  Administered 2016-12-10 – 2016-12-11 (×2): 200 mg via ORAL
  Filled 2016-12-10 (×2): qty 2

## 2016-12-10 MED ORDER — SODIUM CHLORIDE 0.9 % IV BOLUS (SEPSIS)
500.0000 mL | Freq: Once | INTRAVENOUS | Status: AC
  Administered 2016-12-10: 500 mL via INTRAVENOUS

## 2016-12-10 MED ORDER — PIPERACILLIN-TAZOBACTAM 3.375 G IVPB
3.3750 g | Freq: Three times a day (TID) | INTRAVENOUS | Status: AC
  Administered 2016-12-11: 3.375 g via INTRAVENOUS
  Filled 2016-12-10: qty 50

## 2016-12-10 MED ORDER — ASPIRIN 81 MG PO CHEW
324.0000 mg | CHEWABLE_TABLET | Freq: Once | ORAL | Status: AC
  Administered 2016-12-10: 324 mg via ORAL
  Filled 2016-12-10: qty 4

## 2016-12-10 MED ORDER — CALCIUM CARBONATE ANTACID 500 MG PO CHEW
1.0000 | CHEWABLE_TABLET | Freq: Three times a day (TID) | ORAL | Status: DC
  Administered 2016-12-11 (×2): 200 mg via ORAL
  Filled 2016-12-10 (×2): qty 1

## 2016-12-10 MED ORDER — INSULIN ASPART 100 UNIT/ML ~~LOC~~ SOLN: 0.0000 [IU] | Freq: Every day | SUBCUTANEOUS | Status: DC

## 2016-12-10 MED ORDER — APIXABAN 2.5 MG PO TABS
2.5000 mg | ORAL_TABLET | Freq: Two times a day (BID) | ORAL | Status: DC
  Administered 2016-12-11 (×2): 2.5 mg via ORAL
  Filled 2016-12-10 (×6): qty 1

## 2016-12-10 MED ORDER — ACETAMINOPHEN 325 MG PO TABS: 650.0000 mg | ORAL_TABLET | ORAL | Status: DC | PRN

## 2016-12-10 MED ORDER — FUROSEMIDE 10 MG/ML IJ SOLN
40.0000 mg | Freq: Once | INTRAMUSCULAR | Status: AC
  Administered 2016-12-10: 40 mg via INTRAVENOUS
  Filled 2016-12-10: qty 4

## 2016-12-10 MED ORDER — FUROSEMIDE 40 MG PO TABS
40.0000 mg | ORAL_TABLET | Freq: Two times a day (BID) | ORAL | Status: DC
  Administered 2016-12-11: 40 mg via ORAL
  Filled 2016-12-10: qty 1

## 2016-12-10 MED ORDER — INSULIN ASPART 100 UNIT/ML ~~LOC~~ SOLN
0.0000 [IU] | Freq: Three times a day (TID) | SUBCUTANEOUS | Status: DC
  Administered 2016-12-11: 2 [IU] via SUBCUTANEOUS

## 2016-12-10 MED ORDER — LEVOTHYROXINE SODIUM 25 MCG PO TABS
25.0000 ug | ORAL_TABLET | Freq: Every day | ORAL | Status: DC
  Administered 2016-12-11: 25 ug via ORAL
  Filled 2016-12-10: qty 1
  Filled 2016-12-10 (×3): qty 0.5

## 2016-12-10 MED ORDER — DILTIAZEM HCL-DEXTROSE 100-5 MG/100ML-% IV SOLN (PREMIX)
5.0000 mg/h | Freq: Once | INTRAVENOUS | Status: AC
  Administered 2016-12-10: 5 mg/h via INTRAVENOUS
  Filled 2016-12-10: qty 100

## 2016-12-10 MED ORDER — PIPERACILLIN-TAZOBACTAM 3.375 G IVPB 30 MIN
3.3750 g | Freq: Once | INTRAVENOUS | Status: AC
  Administered 2016-12-10: 3.375 g via INTRAVENOUS
  Filled 2016-12-10: qty 50

## 2016-12-10 MED ORDER — VANCOMYCIN HCL IN DEXTROSE 1-5 GM/200ML-% IV SOLN
1000.0000 mg | Freq: Once | INTRAVENOUS | Status: AC
  Administered 2016-12-10: 1000 mg via INTRAVENOUS
  Filled 2016-12-10: qty 200

## 2016-12-10 MED ORDER — ONDANSETRON HCL 4 MG/2ML IJ SOLN: 4.0000 mg | Freq: Four times a day (QID) | INTRAMUSCULAR | Status: DC | PRN

## 2016-12-10 NOTE — ED Triage Notes (Signed)
Pt sent from [facility] for irregular HR and shortness of breath for the past few days.

## 2016-12-10 NOTE — ED Notes (Signed)
CRITICAL VALUE ALERT  Critical value received:  Troponin 0.12  Date of notification:  12/10/16  Time of notification:  2043 hrs  Critical value read back:Yes.    Nurse who received alert:  Y. Dhanvi Boesen, RN  Responding MD:  Dr. Jeronimo Greaves  Time MD responded:  2347 hrs

## 2016-12-10 NOTE — Progress Notes (Addendum)
Pharmacy Note:  Initial antibiotics for Vancomycin and Zosyn ordered by EDP for Sepsis.  CrCl cannot be calculated (Patient's most recent lab result is older than the maximum 21 days allowed.).   No Known Allergies  Vitals:   12/10/16 1730 12/10/16 1800  BP: 126/87 132/84  Pulse: (!) 35 (!) 116  Resp: (!) 36 (!) 34  Temp:      Anti-infectives    Start     Dose/Rate Route Frequency Ordered Stop   12/10/16 1815  piperacillin-tazobactam (ZOSYN) IVPB 3.375 g     3.375 g 100 mL/hr over 30 Minutes Intravenous  Once 12/10/16 1807     12/10/16 1815  vancomycin (VANCOCIN) IVPB 1000 mg/200 mL premix     1,000 mg 200 mL/hr over 60 Minutes Intravenous  Once 12/10/16 1807        Plan: Initial doses of Vancomycin and Zosyn X 1 ordered. Zosyn 3.375gm IV every 8 hours. Follow-up micro data, labs, vitals.  F/U AM labs for further Vancomycin dosing.  Pricilla Larsson, [facility] 12/10/2016 6:26 PM

## 2016-12-10 NOTE — ED Provider Notes (Signed)
[facility] DEPT Provider Note   CSN: [id number] Arrival date & time: 12/10/16  1723     History   Chief Complaint Chief Complaint  Patient presents with  . Shortness of Breath    HPI [name] is a 83 y.o. female.  HPI Level V caveat due to dementia. [facility] home for reported shortness of breath and fever. Temperature 100.6 and nursing home. Also reportedly fast heart rate. Found to be in A. fib with RVR here. Appears to have a history of atrial fibrillation. Patient cannot really provide much history. Past Medical History:  Diagnosis Date  . Arteriosclerotic cardiovascular disease (ASCVD)    cath in 2001- 40% LAD and 50% RCA; stent for 80% LAD in 5/04; residual 50% LAD, 80% small D1, 70% small OM1 50% ostial RCA, nl EF   . Atrial fibrillation ([place]) 2007   CHF with preserved LV systolic function - 8882; moderate LVH  . Bronchitis    history  . Carcinoma of breast ([place])    left masectomy in 1995  . Chronic diastolic heart failure ([place]) 11/21/2009  . Chronic hoarseness   . CVD (cerebrovascular disease)    plaque w/o focal disease in 2006; h/o CVA  . Diabetes mellitus type II    no insulin   . Edema   . Herpes zoster   . Hypertension   . Hypothyroid   . Lower extremity edema 08/20/2011  . MVP (mitral valve prolapse)    moderate; with moderate MR  . Osteoporosis   . Peripheral neuropathy   . PVD (peripheral vascular disease) (HCC)    ABIs of 0.64 and 0.59, right and left leg in 2009  . 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 Shingles   . Ulcer    left lower leg  . Varicose veins of legs 08/20/2011    Patient Active Problem List   Diagnosis Date Noted  . SIRS (systemic inflammatory response syndrome) ([place]) 12/10/2016  . Elevated troponin 12/10/2016  . SOB (shortness of breath)   . Lewy body dementia without behavioral disturbance   . Hyponatremia 03/18/2016  . Abdominal distention 04/13/2014  . Hip pain 04/12/2014  . Labral tear of hip, degenerative  04/12/2014  . Trochanteric bursitis of right hip 04/12/2014  . Encounter for therapeutic drug monitoring 10/06/2013  . Lower extremity edema 08/20/2011  . Arteriosclerotic cardiovascular disease (ASCVD)   . Carcinoma of breast ([place])   . Atrial fibrillation with RVR ([place])   . MVP (mitral valve prolapse)   . PVD (peripheral vascular disease) ([place])   . CVD (cerebrovascular disease)   . Thyroid activity decreased   . Osteoporosis   . Chronic anticoagulation 11/22/2010  . Type 2 diabetes mellitus ([place]) 11/21/2009  . Hyperlipidemia 11/21/2009  . Hypertension 11/21/2009  . Acute on chronic diastolic heart failure ([place]) 11/21/2009  . SPINAL STENOSIS, LUMBAR 09/05/2008    Past Surgical History:  Procedure Laterality Date  . ABDOMINAL HYSTERECTOMY     abd?  . CATARACT EXTRACTION, BILATERAL    . CHOLECYSTECTOMY  2006  . COLONOSCOPY  Date unknown  . CORONARY ANGIOPLASTY WITH STENT PLACEMENT    . left masectomy  1995  . ORIF of left hip  05/22/06   Darylene Price History    No data available       Home Medications    Prior to Admission medications   Medication Sig Start Date End Date Taking? Authorizing Provider  acetaminophen (TYLENOL) 325 MG tablet Take 650 mg by  mouth 2 (two) times daily.   Yes Historical Provider, MD  atorvastatin (LIPITOR) 20 MG tablet Take 20 mg by mouth daily.   Yes Historical Provider, MD  calcium carbonate (TUMS - DOSED IN MG ELEMENTAL CALCIUM) 500 MG chewable tablet Chew 1 tablet by mouth 3 (three) times daily before meals.   Yes Historical Provider, MD  diltiazem (CARDIZEM CD) 120 MG 24 hr capsule Take 1 capsule (120 mg total) by mouth daily. 09/30/16  Yes Orvan Falconer, MD  ELIQUIS 5 MG TABS tablet Take 1 tablet (5 mg total) by mouth 2 (two) times daily. 09/30/16  Yes Orvan Falconer, MD  furosemide (LASIX) 20 MG tablet Take 2 tablets (40 mg total) by mouth 2 (two) times daily. 03/21/16  Yes Barton Dubois, MD  gabapentin (NEURONTIN) 100 MG capsule Take 200 mg by  mouth every 8 (eight) hours.    Yes Historical Provider, MD  isosorbide mononitrate (IMDUR) 30 MG 24 hr tablet Take 0.5 tablets (15 mg total) by mouth daily. Patient taking differently: Take 30 mg by mouth daily.  03/21/16  Yes Barton Dubois, MD  levothyroxine (SYNTHROID, LEVOTHROID) 25 MCG tablet Take 25 mcg by mouth every evening.    Yes Historical Provider, MD  lisinopril (PRINIVIL,ZESTRIL) 20 MG tablet Take 1 tablet (20 mg total) by mouth daily. 03/21/16  Yes Barton Dubois, MD  metFORMIN (GLUCOPHAGE) 500 MG tablet Take 500 mg by mouth 2 (two) times daily with a meal.   Yes Historical Provider, MD  potassium chloride SA (K-DUR,KLOR-CON) 20 MEQ tablet Take 2 tablets (40 mEq total) by mouth daily. 03/21/16  Yes Barton Dubois, MD    Family History Family History  Problem Relation Age of Onset  . Diabetes Mother     Social History Social History  Substance Use Topics  . Smoking status: Never Smoker  . Smokeless tobacco: Never Used     Comment: tobacco use - no   . Alcohol use No     Allergies   Patient has no known allergies.   Review of Systems Review of Systems  Unable to perform ROS: Dementia     Physical Exam Updated Vital Signs BP (!) 111/91   Pulse (!) 119   Temp 98.4 F (36.9 C) (Rectal)   Resp (!) 28   Ht 5\' 6"  (1.676 m)   Wt 119 lb (54 kg)   SpO2 96%   BMI 19.21 kg/m   Physical Exam  Constitutional: She appears well-developed.  HENT:  Head: Atraumatic.  Eyes: Pupils are equal, round, and reactive to light.  Neck: Neck supple.  Cardiovascular:  Murmur heard. Irregular tachycardia. Loud systolic murmur best heard at left side of heart.  Pulmonary/Chest:  Mild tachypnea. No frank rales or rhonchi.  Abdominal: Soft. There is no tenderness.  Musculoskeletal: She exhibits edema.  Bilateral lower extremity pitting edema. Dressing on right forefoot removed. Toes overall have some mild erythema but overall good looking.  Neurological: She is alert.  Alert  with some confusion.  Skin: Capillary refill takes less than 2 seconds.     ED Treatments / Results  Labs (all labs ordered are listed, but only abnormal results are displayed) Labs Reviewed  COMPREHENSIVE METABOLIC PANEL - Abnormal; Notable for the following:       Result Value   Sodium 132 (*)    Glucose, Bld 185 (*)    BUN 21 (*)    AST 49 (*)    GFR calc non Af Amer 55 (*)    All  other components within normal limits  CBC WITH DIFFERENTIAL/PLATELET - Abnormal; Notable for the following:    RDW 16.8 (*)    All other components within normal limits  URINALYSIS, ROUTINE W REFLEX MICROSCOPIC - Abnormal; Notable for the following:    APPearance HAZY (*)    Protein, ur 30 (*)    Leukocytes, UA SMALL (*)    Bacteria, UA RARE (*)    Squamous Epithelial / LPF 6-30 (*)    All other components within normal limits  TROPONIN I - Abnormal; Notable for the following:    Troponin I 0.12 (*)    All other components within normal limits  CULTURE, BLOOD (ROUTINE X 2)  CULTURE, BLOOD (ROUTINE X 2)  BASIC METABOLIC PANEL  TROPONIN I  TROPONIN I  TSH  PROCALCITONIN  I-STAT CG4 LACTIC ACID, ED    EKG  EKG Interpretation  Date/Time:  Tuesday Dec 10 2016 17:59:55 EDT Ventricular Rate:  134 PR Interval:    QRS Duration: 99 QT Interval:  322 QTC Calculation: 481 R Axis:   81 Text Interpretation:  Atrial fibrillation Borderline right axis deviation Nonspecific T abnormalities, anterior leads Confirmed by Alvino Chapel  MD, Jatoya Armbrister [phone]) on 12/10/2016 6:30:30 PM       Radiology Dg Chest Port 1 View  Result Date: 12/10/2016 CLINICAL DATA:  Shortness of breath and fever. EXAM: PORTABLE CHEST 1 VIEW COMPARISON:  09/27/2016 FINDINGS: Lungs are adequately inflated with mild left base opacification improved. Mild prominence of the perihilar markings suggesting mild vascular congestion. Stable cardiomegaly. Moderate calcified plaque over the thoracic aorta. Remainder of the exam is unchanged.  IMPRESSION: Moderate cardiomegaly with suggestion of mild vascular congestion. Improved left base opacification. Aortic atherosclerosis. Electronically Signed   By: Marin Olp M.D.   On: 12/10/2016 18:48    Procedures Procedures (including critical care time)  Medications Ordered in ED Medications  piperacillin-tazobactam (ZOSYN) IVPB 3.375 g (not administered)  calcium carbonate (TUMS - dosed in mg elemental calcium) chewable tablet 200 mg of elemental calcium (not administered)  apixaban (ELIQUIS) tablet 2.5 mg (not administered)  furosemide (LASIX) tablet 40 mg (not administered)  isosorbide mononitrate (IMDUR) 24 hr tablet 15 mg (not administered)  atorvastatin (LIPITOR) tablet 20 mg (not administered)  gabapentin (NEURONTIN) capsule 200 mg (not administered)  levothyroxine (SYNTHROID, LEVOTHROID) tablet 25 mcg (not administered)  acetaminophen (TYLENOL) tablet 650 mg (not administered)  ondansetron (ZOFRAN) injection 4 mg (not administered)  insulin aspart (novoLOG) injection 0-5 Units (not administered)  insulin aspart (novoLOG) injection 0-9 Units (not administered)  diltiazem (CARDIZEM) 100 mg in dextrose 5% 123mL (1 mg/mL) infusion (not administered)  ALPRAZolam (XANAX) tablet 0.25 mg (not administered)  aspirin chewable tablet 324 mg (not administered)  sodium chloride 0.9 % bolus 500 mL (0 mLs Intravenous Stopped 12/10/16 1918)  piperacillin-tazobactam (ZOSYN) IVPB 3.375 g (0 g Intravenous Stopped 12/10/16 1954)  vancomycin (VANCOCIN) IVPB 1000 mg/200 mL premix (0 mg Intravenous Stopped 12/10/16 1954)  sodium chloride 0.9 % bolus 500 mL (0 mLs Intravenous Stopped 12/10/16 1954)  diltiazem (CARDIZEM) 100 mg in dextrose 5% 160mL (1 mg/mL) infusion (5 mg/hr Intravenous Transfusing/Transfer 12/10/16 2048)     Initial Impression / Assessment and Plan / ED Course  I have reviewed the triage vital signs and the nursing notes.  Pertinent labs & imaging results that were available during  my care of the patient were reviewed by me and considered in my medical decision making (see chart for details).     Patient sent in  with fever. Afebrile here. Has an A. fib with RVR. No clear sign of infection. Does have foot redness but does not seem severe. A. fib with RVR. A. fib is chronic. Chronic anticoagulation. Cardizem started for rate control. Chadsvasc score of atleast 5. Admit to internal medicine.  CRITICAL CARE Performed by: Mackie Pai Total critical care time: 30 minutes Critical care time was exclusive of separately billable procedures and treating other patients. Critical care was necessary to treat or prevent imminent or life-threatening deterioration. Critical care was time spent personally by me on the following activities: development of treatment plan with patient and/or surrogate as well as nursing, discussions with consultants, evaluation of patient's response to treatment, examination of patient, obtaining history from patient or surrogate, ordering and performing treatments and interventions, ordering and review of laboratory studies, ordering and review of radiographic studies, pulse oximetry and re-evaluation of patient's condition.  Code sepsis initially called, but patient is not clearly septic at this time.   Final Clinical Impressions(s) / ED Diagnoses   Final diagnoses:  Atrial fibrillation with rapid ventricular response ([place])  Fever, unspecified fever cause    New Prescriptions New Prescriptions   No medications on file     Davonna Belling, MD 12/10/16 2113

## 2016-12-10 NOTE — H&P (Signed)
History and Physical    [name]:[id number] DOB: March 27, 1921 DOA: 12/10/2016  PCP: Wende Neighbors, MD   Patient coming from: SNF  Chief Complaint: Rapid HR, breathing difficulty, temp 100.6 F  HPI: [name] is a 83 y.o. female with medical history significant for dementia, cancer of the left breast status post remote mastectomy, chronic diastolic CHF, coronary artery disease with stent to LAD, hypertension, and chronic atrial fibrillation on  Eliquis who presents from her nursing facility for evaluation of tachycardia, respiratory difficulty, and a temperature of 100.6 F. She had reportedly been doing well at the SNF until a few days ago when she was noted to develop a rapid heart rate. Over the past few days, she has been noted to have intermittently rapid heart rate, up into the 130s, and has also been noted to be tachypneic and with apparent increase in her work of breathing. In addition to this, patient was noted today to have a temperature of 100.6 F and was directed to the emergency department for further evaluation of this. Patient herself has not expressed any complaints aside from some generalized weakness. She denies chest pain, denies palpitations, and denies any significant dyspnea. She reports some pain in her feet but states that this is chronic and largely unchanged.     ED Course: Upon arrival to the ED, patient is found to be afebrile, saturating adequately on room air, mildly tachypneic, tachycardic in the 130s in atrial fibrillation, and with stable blood pressure. EKG features atrial fibrillation with rate 134. Chest x-ray is notable for moderate cardiomegaly with mild pulmonary vascular congestion and improved left basilar opacity. Chemistry panel reveals a sodium of 132 and an elevated BUN to creatinine ratio. CBC is unremarkable, lactic acid is reassuring at 1.30, and troponin is elevated to 0.12. Urinalysis is not particularly suggestive of infection. Blood cultures were  obtained, 1 L of normal saline was given, empiric vancomycin and Zosyn was initiated, and the patient was started on a Cardizem infusion. Work of breathing improved with administration of 2 L/m supplemental oxygen. Heart rate is coming under control with diltiazem. Patient will be admitted to the stepdown unit for ongoing evaluation and management reported fever at the nursing home, rapid atrial fibrillation, and elevated cardiac enzyme.  Review of Systems:  All other systems reviewed and apart from HPI, are negative.  Past Medical History:  Diagnosis Date  . Arteriosclerotic cardiovascular disease (ASCVD)    cath in 2001- 40% LAD and 50% RCA; stent for 80% LAD in 5/04; residual 50% LAD, 80% small D1, 70% small OM1 50% ostial RCA, nl EF   . Atrial fibrillation ([place]) 2007   CHF with preserved LV systolic function - 8119; moderate LVH  . Bronchitis    history  . Carcinoma of breast ([place])    left masectomy in 1995  . Chronic diastolic heart failure ([place]) 11/21/2009  . Chronic hoarseness   . CVD (cerebrovascular disease)    plaque w/o focal disease in 2006; h/o CVA  . Diabetes mellitus type II    no insulin   . Edema   . Herpes zoster   . Hypertension   . Hypothyroid   . Lower extremity edema 08/20/2011  . MVP (mitral valve prolapse)    moderate; with moderate MR  . Osteoporosis   . Peripheral neuropathy   . PVD (peripheral vascular disease) ([facility])    ABIs of 0.64 and 0.59, right and left leg in 2009  . Right knee DJD  08/21/2011  . Shingles   . Ulcer    left lower leg  . Varicose veins of legs 08/20/2011    Past Surgical History:  Procedure Laterality Date  . ABDOMINAL HYSTERECTOMY     abd?  . CATARACT EXTRACTION, BILATERAL    . CHOLECYSTECTOMY  2006  . COLONOSCOPY  Date unknown  . CORONARY ANGIOPLASTY WITH STENT PLACEMENT    . left masectomy  1995  . ORIF of left hip  05/22/06   [name]     reports that she has never smoked. She has never used smokeless tobacco. She reports  that she does not drink alcohol or use drugs.  No Known Allergies  Family History  Problem Relation Age of Onset  . Diabetes Mother      Prior to Admission medications   Medication Sig Start Date End Date Taking? Authorizing Provider  acetaminophen (TYLENOL) 325 MG tablet Take 650 mg by mouth 2 (two) times daily.   Yes Historical Provider, MD  atorvastatin (LIPITOR) 20 MG tablet Take 20 mg by mouth daily.   Yes Historical Provider, MD  calcium carbonate (TUMS - DOSED IN MG ELEMENTAL CALCIUM) 500 MG chewable tablet Chew 1 tablet by mouth 3 (three) times daily before meals.   Yes Historical Provider, MD  diltiazem (CARDIZEM CD) 120 MG 24 hr capsule Take 1 capsule (120 mg total) by mouth daily. 09/30/16  Yes Orvan Falconer, MD  ELIQUIS 5 MG TABS tablet Take 1 tablet (5 mg total) by mouth 2 (two) times daily. 09/30/16  Yes Orvan Falconer, MD  furosemide (LASIX) 20 MG tablet Take 2 tablets (40 mg total) by mouth 2 (two) times daily. 03/21/16  Yes Barton Dubois, MD  gabapentin (NEURONTIN) 100 MG capsule Take 200 mg by mouth every 8 (eight) hours.    Yes Historical Provider, MD  isosorbide mononitrate (IMDUR) 30 MG 24 hr tablet Take 0.5 tablets (15 mg total) by mouth daily. Patient taking differently: Take 30 mg by mouth daily.  03/21/16  Yes Barton Dubois, MD  levothyroxine (SYNTHROID, LEVOTHROID) 25 MCG tablet Take 25 mcg by mouth every evening.    Yes Historical Provider, MD  lisinopril (PRINIVIL,ZESTRIL) 20 MG tablet Take 1 tablet (20 mg total) by mouth daily. 03/21/16  Yes Barton Dubois, MD  metFORMIN (GLUCOPHAGE) 500 MG tablet Take 500 mg by mouth 2 (two) times daily with a meal.   Yes Historical Provider, MD  potassium chloride SA (K-DUR,KLOR-CON) 20 MEQ tablet Take 2 tablets (40 mEq total) by mouth daily. 03/21/16  Yes Barton Dubois, MD    Physical Exam: Vitals:   12/10/16 1930 12/10/16 2000 12/10/16 2030 12/10/16 2100  BP: (!) 155/95 (!) 150/98 (!) 139/96 (!) 111/91  Pulse: (!) 131 (!) 121 (!) 34 (!)  119  Resp: (!) 31 (!) 30 (!) 32 (!) 28  Temp:      TempSrc:      SpO2: 94% 94% 93% 96%  Weight:      Height:          Constitutional: No acute distress, calm, comfortable Eyes: PERTLA, lids and conjunctivae normal ENMT: Mucous membranes are moist. Posterior pharynx clear of any exudate or lesions.   Neck: normal, supple, no masses, no thyromegaly Respiratory: clear to auscultation bilaterally, no wheezing, no crackles. Mildly tachypneic. Speaking full sentances. No accessory muscle use.  Cardiovascular: Rate ~120 and irregular. 1+ pretibial edema bilaterally. Neck veins distended. Abdomen: No distension, no tenderness, no masses palpated. Bowel sounds normal.  Musculoskeletal: no clubbing / cyanosis.  No joint deformity upper and lower extremities.    Skin: no significant rashes, lesions, ulcers. Warm, dry, well-perfused. Neurologic: CN 2-12 grossly intact. Sensation intact, DTR normal. Strength 5/5 in all 4 limbs.  Psychiatric: Alert and oriented to person and place, not oriented to month or year. Pleasant and cooperative.     Labs on Admission: I have personally reviewed following labs and imaging studies  CBC:  Recent Labs Lab 12/10/16 1807  WBC 7.8  NEUTROABS 6.7  HGB 12.5  HCT 38.6  MCV 82.3  PLT 937   Basic Metabolic Panel:  Recent Labs Lab 12/10/16 1807  NA 132*  K 4.5  CL 101  CO2 22  GLUCOSE 185*  BUN 21*  CREATININE 0.88  CALCIUM 8.9   GFR: Estimated Creatinine Clearance: 34 mL/min (by C-G formula based on SCr of 0.88 mg/dL). Liver Function Tests:  Recent Labs Lab 12/10/16 1807  AST 49*  ALT 40  ALKPHOS 91  BILITOT 0.9  PROT 6.6  ALBUMIN 3.5   No results for input(s): LIPASE, AMYLASE in the last 168 hours. No results for input(s): AMMONIA in the last 168 hours. Coagulation Profile: No results for input(s): INR, PROTIME in the last 168 hours. Cardiac Enzymes:  Recent Labs Lab 12/10/16 1908  TROPONINI 0.12*   BNP (last 3  results) No results for input(s): PROBNP in the last 8760 hours. HbA1C: No results for input(s): HGBA1C in the last 72 hours. CBG: No results for input(s): GLUCAP in the last 168 hours. Lipid Profile: No results for input(s): CHOL, HDL, LDLCALC, TRIG, CHOLHDL, LDLDIRECT in the last 72 hours. Thyroid Function Tests: No results for input(s): TSH, T4TOTAL, FREET4, T3FREE, THYROIDAB in the last 72 hours. Anemia Panel: No results for input(s): VITAMINB12, FOLATE, FERRITIN, TIBC, IRON, RETICCTPCT in the last 72 hours. Urine analysis:    Component Value Date/Time   COLORURINE YELLOW 12/10/2016 1925   APPEARANCEUR HAZY (A) 12/10/2016 1925   LABSPEC 1.014 12/10/2016 1925   PHURINE 5.0 12/10/2016 1925   GLUCOSEU NEGATIVE 12/10/2016 1925   HGBUR NEGATIVE 12/10/2016 1925   BILIRUBINUR NEGATIVE 12/10/2016 1925   KETONESUR NEGATIVE 12/10/2016 1925   PROTEINUR 30 (A) 12/10/2016 1925   UROBILINOGEN 0.2 04/19/2014 1730   NITRITE NEGATIVE 12/10/2016 1925   LEUKOCYTESUR SMALL (A) 12/10/2016 1925   Sepsis Labs: @LABRCNTIP (procalcitonin:4,lacticidven:4) )No results found for this or any previous visit (from the past 240 hour(s)).   Radiological Exams on Admission: Dg Chest Port 1 View  Result Date: 12/10/2016 CLINICAL DATA:  Shortness of breath and fever. EXAM: PORTABLE CHEST 1 VIEW COMPARISON:  09/27/2016 FINDINGS: Lungs are adequately inflated with mild left base opacification improved. Mild prominence of the perihilar markings suggesting mild vascular congestion. Stable cardiomegaly. Moderate calcified plaque over the thoracic aorta. Remainder of the exam is unchanged. IMPRESSION: Moderate cardiomegaly with suggestion of mild vascular congestion. Improved left base opacification. Aortic atherosclerosis. Electronically Signed   By: Marin Olp M.D.   On: 12/10/2016 18:48    EKG: Independently reviewed. Atrial fibrillation (rate 134).   Assessment/Plan  1. Chronic atrial fibrillation with RVR   - Pt has chronic atrial fibrillation, CHADS-VASc 7 (age x2, gender, CAD, CHF, HTN, DM)  - Presents from SNF with reports of intermittently rapid rate for a few days leading up to presentation  - Noted to be in AF with rate in 130's on arrival, rate came down slightly with 1 liter of NS in ED  - Possibly precipitated by infection given reported fever at the SNF,  but afebrile here without leukocytosis or elevated lactate and no apparent source  - Ischemic etiology possible given troponin elevation but pt denies chest pain and EKG without acute ischemic features - Check TSH, trend troponin, monitor on telemetry, start diltiazem infusion and convert back to PO once parameters met  - Continue anticoagulation with Eliquis   2. SIRS  - Fever was reported at SNF, none here; she was also tachycardic and tachypneic in setting of rapid a fib - Blood cultures were obtained, 1 liter of NS bolused, and empiric vancomycin and Zosyn administered in ED  - With blood and urine cultures are incubating, plan is to check pro-calcitonin, continue Zosyn for one more dose overnight, and unless some evidence for infection develops, discontinue abx    3. Acute on chronic diastolic CHF  - Wt is up a few lbs and CXR demonstrates vascular congestion  - There was initially concern for possible sepsis in ED and she received a liter bolus  - TTE (03/28/16) with EF 60-65%, massive LAE, mild TR, mild MR, moderately elevated PA pressures, and no WMA's  - Managed at home with Lasix 40 mg BID and lisinopril  - Plan to give a dose Lasix 40 mg IV on admission, then resume her home PO dosing in am  - SLIV, follow strict I/O's and daily wts, fluid-restrict diet, hold lisinopril initially while following renal fxn    4. Elevated troponin, CAD  - Troponin elevated to 0.12  - Pt has hx of CAD with stent to LAD  - She denies chest pain, dyspnea, or nausea and no acute ischemic features noted on EKG  - Elevation likely represents a  demand ischemia in setting of rapid a fib  - Treat with ASA 324 mg, monitor on telemetry, trend troponin, continue Lipitor, continue AC with Eliquis   5. Hyponatremia  - Serum sodium is 132 on admission  - Likely secondary to CHF and reduced effective arterial volume  - Given a dose of IV Lasix on admission, will repeat BMP in am    6. Type II DM  - A1c 7.4% in February 2018  - Managed at home with metformin only, held while in hospital  - Check CBG with meals and qHS  - Start a low-intensity Novolog sliding-scale    7. Dementia  - Appears stable, attributed to Lewy body - No focal neurologic deficits identified    DVT prophylaxis: Eliquis  Code Status: Full  Family Communication: Discussed with patient Disposition Plan: Admit to telemetry Consults called: None Admission status: Inpatient    Vianne Bulls, MD [facility] Pager [phone]  If 7PM-7AM, please contact night-coverage [email or web address] Password [email or web address]  12/10/2016, 9:09 PM

## 2016-12-11 DIAGNOSIS — I4891 Unspecified atrial fibrillation: Secondary | ICD-10-CM | POA: Diagnosis not present

## 2016-12-11 DIAGNOSIS — I482 Chronic atrial fibrillation: Secondary | ICD-10-CM | POA: Diagnosis not present

## 2016-12-11 LAB — GLUCOSE, CAPILLARY
GLUCOSE-CAPILLARY: 170 mg/dL — AB (ref 65–99)
Glucose-Capillary: 107 mg/dL — ABNORMAL HIGH (ref 65–99)

## 2016-12-11 LAB — BASIC METABOLIC PANEL
Anion gap: 7 (ref 5–15)
BUN: 18 mg/dL (ref 6–20)
CHLORIDE: 102 mmol/L (ref 101–111)
CO2: 27 mmol/L (ref 22–32)
Calcium: 7.6 mg/dL — ABNORMAL LOW (ref 8.9–10.3)
Creatinine, Ser: 0.82 mg/dL (ref 0.44–1.00)
GFR calc non Af Amer: 60 mL/min — ABNORMAL LOW (ref >60)
Glucose, Bld: 144 mg/dL — ABNORMAL HIGH (ref 65–99)
POTASSIUM: 3.4 mmol/L — AB (ref 3.5–5.1)
SODIUM: 136 mmol/L (ref 135–145)

## 2016-12-11 LAB — TROPONIN I
Troponin I: 0.09 ng/mL (ref <0.03)
Troponin I: 0.1 ng/mL (ref <0.03)
Troponin I: 0.11 ng/mL (ref <0.03)

## 2016-12-11 LAB — MRSA PCR SCREENING: MRSA BY PCR: NEGATIVE

## 2016-12-11 MED ORDER — ORAL CARE MOUTH RINSE
15.0000 mL | Freq: Two times a day (BID) | OROMUCOSAL | Status: DC
  Administered 2016-12-11: 15 mL via OROMUCOSAL

## 2016-12-11 MED ORDER — APIXABAN 2.5 MG PO TABS: 2.5000 mg | ORAL_TABLET | Freq: Two times a day (BID) | ORAL | 2 refills | Status: AC

## 2016-12-11 MED ORDER — DILTIAZEM HCL ER COATED BEADS 120 MG PO CP24
120.0000 mg | ORAL_CAPSULE | Freq: Every day | ORAL | Status: DC
  Administered 2016-12-11: 120 mg via ORAL
  Filled 2016-12-11: qty 1

## 2016-12-11 NOTE — Progress Notes (Signed)
Notified Dr. Jerilee Hoh notified of temp of 99.6, heart rate of 100-120, and that patient has a congestive cough. No new orders noted, discharge patient to [facility].

## 2016-12-11 NOTE — NC FL2 (Signed)
[place] LEVEL OF CARE SCREENING TOOL     IDENTIFICATION  Patient Name: [name] Birthdate: 01/26/1921 Sex: female Admission Date (Current Location): 12/10/2016  [facility] and [place] Number:  [employer] and Address:  [facility] [address] [place]      Provider Number: [id number]  Attending Physician Name and Address:  [name]*  Relative Name and Phone Number:       Current Level of Care: Hospital Recommended Level of Care: [place] Prior Approval Number:    Date Approved/Denied:   PASRR Number: [id number] ([id number])  Discharge Plan: SNF    Current Diagnoses: Patient Active Problem List   Diagnosis Date Noted  . SIRS (systemic inflammatory response syndrome) ([place]) 12/10/2016  . Elevated troponin 12/10/2016  . SOB (shortness of breath)   . Lewy body dementia without behavioral disturbance   . Hyponatremia 03/18/2016  . Abdominal distention 04/13/2014  . Hip pain 04/12/2014  . Labral tear of hip, degenerative 04/12/2014  . Trochanteric bursitis of right hip 04/12/2014  . Encounter for therapeutic drug monitoring 10/06/2013  . Lower extremity edema 08/20/2011  . Arteriosclerotic cardiovascular disease (ASCVD)   . Carcinoma of breast ([place])   . Atrial fibrillation with RVR ([place])   . MVP (mitral valve prolapse)   . PVD (peripheral vascular disease) ([place])   . CVD (cerebrovascular disease)   . Thyroid activity decreased   . Osteoporosis   . Chronic anticoagulation 11/22/2010  . Type 2 diabetes mellitus ([place]) 11/21/2009  . Hyperlipidemia 11/21/2009  . Hypertension 11/21/2009  . Acute on chronic diastolic heart failure ([place]) 11/21/2009  . SPINAL STENOSIS, LUMBAR 09/05/2008    Orientation RESPIRATION BLADDER Height & Weight     Self  O2 (2L) Continent Weight: 119 lb (54 kg) Height:  5\' 6"  (167.6 cm)  BEHAVIORAL SYMPTOMS/MOOD NEUROLOGICAL BOWEL NUTRITION STATUS   Continent Diet (Diet Heart Healthy. Fluid restriction 1500 mL Fluid)  AMBULATORY STATUS COMMUNICATION OF NEEDS Skin   Extensive Assist Verbally Normal                       Personal Care Assistance Level of Assistance  Bathing, Feeding, Dressing Bathing Assistance: Maximum assistance Feeding assistance: Independent Dressing Assistance: Maximum assistance     Functional Limitations Info  Sight, Hearing, Speech Sight Info: Adequate Hearing Info: Adequate Speech Info: Adequate    SPECIAL CARE FACTORS FREQUENCY                       Contractures Contractures Info: Not present    Additional Factors Info  Code Status Code Status Info: Full             Current Medications (12/11/2016):  This is the current hospital active medication list Current Facility-Administered Medications  Medication Dose Route Frequency Provider Last Rate Last Dose  . acetaminophen (TYLENOL) tablet 650 mg  650 mg Oral Q4H PRN [name] Bulls, MD      . ALPRAZolam Duanne Moron) tablet 0.25 mg  0.25 mg Oral BID PRN [name] Bulls, MD   0.25 mg at 12/10/16 2239  . apixaban (ELIQUIS) tablet 2.5 mg  2.5 mg Oral BID [name] Bulls, MD   2.5 mg at 12/11/16 1103  . atorvastatin (LIPITOR) tablet 20 mg  20 mg Oral q1800 [name] Bulls, MD   20 mg at 12/11/16 0015  . calcium carbonate (TUMS - dosed in mg elemental  calcium) chewable tablet 200 mg of elemental calcium  1 tablet Oral TID AC [name] Qua Opyd, MD   200 mg of elemental calcium at 12/11/16 1204  . diltiazem (CARDIZEM CD) 24 hr capsule 120 mg  120 mg Oral Daily [name] Hau, MD   120 mg at 12/11/16 1059  . diltiazem (CARDIZEM) 100 mg in dextrose 5% 160mL (1 mg/mL) infusion  5-15 mg/hr Intravenous Titrated [name] Bulls, MD   Stopped at 12/11/16 1200  . furosemide (LASIX) tablet 40 mg  40 mg Oral BID [name] Bulls, MD   40 mg at 12/11/16 0803  . gabapentin (NEURONTIN) capsule 200 mg  200 mg Oral Q8H [name] Qua Opyd, MD   200 mg at 12/11/16  0540  . insulin aspart (novoLOG) injection 0-5 Units  0-5 Units Subcutaneous QHS [name] S Opyd, MD      . insulin aspart (novoLOG) injection 0-9 Units  0-9 Units Subcutaneous TID WC [name] Bulls, MD   2 Units at 12/11/16 1204  . isosorbide mononitrate (IMDUR) 24 hr tablet 15 mg  15 mg Oral Daily [name] Hau, MD   15 mg at 12/11/16 1059  . levothyroxine (SYNTHROID, LEVOTHROID) tablet 25 mcg  25 mcg Oral QAC breakfast [name] Hau, MD   25 mcg at 12/11/16 [phone]  . MEDLINE mouth rinse  15 mL Mouth Rinse BID [name] Qua Opyd, MD   15 mL at 12/11/16 0600  . ondansetron (ZOFRAN) injection 4 mg  4 mg Intravenous Q6H PRN [name] Bulls, MD         Discharge Medications: Please see discharge summary for a list of discharge medications.  Relevant Imaging Results:  Relevant Lab Results:   Additional Information    [name], [name] Pugh, LCSW

## 2016-12-11 NOTE — Discharge Summary (Signed)
Physician Discharge Summary  [name] OJJ:[id number] DOB: [birth year] DOA: 12/10/2016  PCP: Wende Neighbors, MD  Admit date: 12/10/2016 Discharge date: 12/11/2016  Time spent: 45 minutes  Recommendations for Outpatient Follow-up:  -Will be discharged back to SNF today.   Discharge Diagnoses:  Principal Problem:   Atrial fibrillation with RVR (HCC) Active Problems:   Type 2 diabetes mellitus (HCC)   Hypertension   Acute on chronic diastolic heart failure (HCC)   Arteriosclerotic cardiovascular disease (ASCVD)   Hyponatremia   Lewy body dementia without behavioral disturbance   SIRS (systemic inflammatory response syndrome) (HCC)   Elevated troponin   Discharge Condition: Stable  Filed Weights   12/10/16 1725  Weight: 54 kg (119 lb)    History of present illness:  As per Dr. Myna Hidalgo on 5/1: [name] is a 83 y.o. female with medical history significant for dementia, cancer of the left breast status post remote mastectomy, chronic diastolic CHF, coronary artery disease with stent to LAD, hypertension, and chronic atrial fibrillation on  Eliquis who presents from her nursing facility for evaluation of tachycardia, respiratory difficulty, and a temperature of 100.6 F. She had reportedly been doing well at the SNF until a few days ago when she was noted to develop a rapid heart rate. Over the past few days, she has been noted to have intermittently rapid heart rate, up into the 130s, and has also been noted to be tachypneic and with apparent increase in her work of breathing. In addition to this, patient was noted today to have a temperature of 100.6 F and was directed to the emergency department for further evaluation of this. Patient herself has not expressed any complaints aside from some generalized weakness. She denies chest pain, denies palpitations, and denies any significant dyspnea. She reports some pain in her feet but states that this is chronic and largely unchanged.     ED  Course: Upon arrival to the ED, patient is found to be afebrile, saturating adequately on room air, mildly tachypneic, tachycardic in the 130s in atrial fibrillation, and with stable blood pressure. EKG features atrial fibrillation with rate 134. Chest x-ray is notable for moderate cardiomegaly with mild pulmonary vascular congestion and improved left basilar opacity. Chemistry panel reveals a sodium of 132 and an elevated BUN to creatinine ratio. CBC is unremarkable, lactic acid is reassuring at 1.30, and troponin is elevated to 0.12. Urinalysis is not particularly suggestive of infection. Blood cultures were obtained, 1 L of normal saline was given, empiric vancomycin and Zosyn was initiated, and the patient was started on a Cardizem infusion. Work of breathing improved with administration of 2 L/m supplemental oxygen. Heart rate is coming under control with diltiazem. Patient will be admitted to the stepdown unit for ongoing evaluation and management reported fever at the nursing home, rapid atrial fibrillation, and elevated cardiac enzyme.  Hospital Course:   A Fib with RVR -Required a cardizem drip. -Has now been converted back to her home regimen of cardizem CD 120 mg daily with HR in the 80-90 range.  SIRS -Fever reported from SNF (none while hospitalized), had some tachycardia in the setting of a fib with RVR. -Received a couple doses of abx. -Without a clear source of infection, no further abx prescribed on DC.  Chronic Diastolic CHF -ECHO 9/37: EF 60-65% with basal hypertrophy. -Compensated.  Elevated Troponin -Peaked at 0.11, likely demand ischemia from a fib with RVR. -no CP, no acute EKG changes. -no further cardiac  work up planned for at present.  DM II -Well controlled. -Continue home medications.  Dementia -At baseline.  Procedures:  None   Consultations:  None  Discharge Instructions  Discharge Instructions    Diet - low sodium heart healthy    Complete by:   As directed    Increase activity slowly    Complete by:  As directed      Allergies as of 12/11/2016   No Known Allergies     Medication List    TAKE these medications   acetaminophen 325 MG tablet Commonly known as:  TYLENOL Take 650 mg by mouth 2 (two) times daily.   apixaban 2.5 MG Tabs tablet Commonly known as:  ELIQUIS Take 1 tablet (2.5 mg total) by mouth 2 (two) times daily. What changed:  medication strength  how much to take   atorvastatin 20 MG tablet Commonly known as:  LIPITOR Take 20 mg by mouth daily.   calcium carbonate 500 MG chewable tablet Commonly known as:  TUMS - dosed in mg elemental calcium Chew 1 tablet by mouth 3 (three) times daily before meals.   diltiazem 120 MG 24 hr capsule Commonly known as:  CARDIZEM CD Take 1 capsule (120 mg total) by mouth daily.   furosemide 20 MG tablet Commonly known as:  LASIX Take 2 tablets (40 mg total) by mouth 2 (two) times daily.   gabapentin 100 MG capsule Commonly known as:  NEURONTIN Take 200 mg by mouth every 8 (eight) hours.   isosorbide mononitrate 30 MG 24 hr tablet Commonly known as:  IMDUR Take 0.5 tablets (15 mg total) by mouth daily. What changed:  how much to take   levothyroxine 25 MCG tablet Commonly known as:  SYNTHROID, LEVOTHROID Take 25 mcg by mouth every evening.   lisinopril 20 MG tablet Commonly known as:  PRINIVIL,ZESTRIL Take 1 tablet (20 mg total) by mouth daily.   metFORMIN 500 MG tablet Commonly known as:  GLUCOPHAGE Take 500 mg by mouth 2 (two) times daily with a meal.   potassium chloride SA 20 MEQ tablet Commonly known as:  K-DUR,KLOR-CON Take 2 tablets (40 mEq total) by mouth daily.      No Known Allergies    The results of significant diagnostics from this hospitalization (including imaging, microbiology, ancillary and laboratory) are listed below for reference.    Significant Diagnostic Studies: Dg Chest Port 1 View  Result Date: 12/10/2016 CLINICAL  DATA:  Shortness of breath and fever. EXAM: PORTABLE CHEST 1 VIEW COMPARISON:  09/27/2016 FINDINGS: Lungs are adequately inflated with mild left base opacification improved. Mild prominence of the perihilar markings suggesting mild vascular congestion. Stable cardiomegaly. Moderate calcified plaque over the thoracic aorta. Remainder of the exam is unchanged. IMPRESSION: Moderate cardiomegaly with suggestion of mild vascular congestion. Improved left base opacification. Aortic atherosclerosis. Electronically Signed   By: Marin Olp M.D.   On: 12/10/2016 18:48    Microbiology: Recent Results (from the past 240 hour(s))  Blood Culture (routine x 2)     Status: None (Preliminary result)   Collection Time: 12/10/16  6:55 PM  Result Value Ref Range Status   Specimen Description BLOOD RIGHT HAND  Final   Special Requests   Final    BOTTLES DRAWN AEROBIC AND ANAEROBIC Blood Culture results may not be optimal due to an inadequate volume of blood received in culture bottles   Culture NO GROWTH < 12 HOURS  Final   Report Status PENDING  Incomplete  Blood Culture (  routine x 2)     Status: None (Preliminary result)   Collection Time: 12/10/16  6:55 PM  Result Value Ref Range Status   Specimen Description BLOOD RIGHT WRIST  Final   Special Requests   Final    BOTTLES DRAWN AEROBIC AND ANAEROBIC Blood Culture results may not be optimal due to an inadequate volume of blood received in culture bottles   Culture NO GROWTH < 12 HOURS  Final   Report Status PENDING  Incomplete  MRSA PCR Screening     Status: None   Collection Time: 12/11/16  5:30 AM  Result Value Ref Range Status   MRSA by PCR NEGATIVE NEGATIVE Final    Comment:        The GeneXpert MRSA Assay (FDA approved for NASAL specimens only), is one component of a comprehensive MRSA colonization surveillance program. It is not intended to diagnose MRSA infection nor to guide or monitor treatment for MRSA infections.      Labs: Basic  Metabolic Panel:  Recent Labs Lab 12/10/16 1807 12/11/16 0543  NA 132* 136  K 4.5 3.4*  CL 101 102  CO2 22 27  GLUCOSE 185* 144*  BUN 21* 18  CREATININE 0.88 0.82  CALCIUM 8.9 7.6*   Liver Function Tests:  Recent Labs Lab 12/10/16 1807  AST 49*  ALT 40  ALKPHOS 91  BILITOT 0.9  PROT 6.6  ALBUMIN 3.5   No results for input(s): LIPASE, AMYLASE in the last 168 hours. No results for input(s): AMMONIA in the last 168 hours. CBC:  Recent Labs Lab 12/10/16 1807  WBC 7.8  NEUTROABS 6.7  HGB 12.5  HCT 38.6  MCV 82.3  PLT 227   Cardiac Enzymes:  Recent Labs Lab 12/10/16 1908 12/11/16 0010 12/11/16 0543 12/11/16 1207  TROPONINI 0.12* 0.09* 0.11* 0.10*   BNP: BNP (last 3 results)  Recent Labs  03/17/16 2005 09/27/16 1935  BNP 483.0* 523.0*    ProBNP (last 3 results) No results for input(s): PROBNP in the last 8760 hours.  CBG:  Recent Labs Lab 12/10/16 2233 12/11/16 0727 12/11/16 1109  GLUCAP 185* 107* 170*       Signed:  HERNANDEZ ACOSTA,Zyad Boomer  [facility] Pager: [phone] 12/11/2016, 1:25 PM

## 2016-12-11 NOTE — Clinical Social Work Note (Signed)
Patient from [facility] and will return to [facility].  LCSW left message for Debbie at [facility] advising of patient's discharge.  LCSW left a message for patient's son advising of discharge. LCSW arranged transportation through [facility].   LCSW signing off.     [name], [name], LCSW

## 2016-12-11 NOTE — Progress Notes (Signed)
Called report to Nurse at [facility].

## 2016-12-11 NOTE — Progress Notes (Signed)
MD aware of troponin trends.  Aldrin Engelhard Rica Mote, RN

## 2016-12-11 NOTE — Care Management Important Message (Signed)
Important Message  Patient Details  Name: [name] MRN: [id number] Date of Birth: June 13, 1921   Medicare Important Message Given:  Yes    Korin Hartwell, Chauncey Reading, RN 12/11/2016, 3:17 PM

## 2016-12-12 LAB — URINE CULTURE

## 2016-12-16 LAB — CULTURE, BLOOD (ROUTINE X 2)
Culture: NO GROWTH
Culture: NO GROWTH

## 2016-12-18 ENCOUNTER — Telehealth: Payer: Self-pay | Admitting: Internal Medicine

## 2016-12-18 NOTE — Telephone Encounter (Signed)
Angel Loftis NP @ [facility] would like someone to give her a call @ [phone] concerning the pt's AFib, Ms. Parrales has an apt to see D. Dunn tomorrow 5/10

## 2016-12-18 NOTE — Telephone Encounter (Signed)
Returned call, no answer. Left message for Glenard Haring to return my call.

## 2016-12-19 ENCOUNTER — Other Ambulatory Visit (HOSPITAL_COMMUNITY)
Admission: RE | Admit: 2016-12-19 | Discharge: 2016-12-19 | Disposition: A | Discharge status: Home / Self Care | Payer: Medicare Other | Source: Ambulatory Visit | Attending: Physician Assistant | Admitting: Physician Assistant

## 2016-12-19 ENCOUNTER — Ambulatory Visit (INDEPENDENT_AMBULATORY_CARE_PROVIDER_SITE_OTHER): Payer: Medicare Other | Admitting: Physician Assistant

## 2016-12-19 ENCOUNTER — Encounter: Payer: Self-pay | Admitting: Physician Assistant

## 2016-12-19 VITALS — BP 122/80 | HR 87 | Wt 118.0 lb

## 2016-12-19 DIAGNOSIS — I5032 Chronic diastolic (congestive) heart failure: Secondary | ICD-10-CM | POA: Diagnosis present

## 2016-12-19 DIAGNOSIS — I2510 Atherosclerotic heart disease of native coronary artery without angina pectoris: Secondary | ICD-10-CM

## 2016-12-19 DIAGNOSIS — R0602 Shortness of breath: Secondary | ICD-10-CM

## 2016-12-19 DIAGNOSIS — I482 Chronic atrial fibrillation: Secondary | ICD-10-CM | POA: Insufficient documentation

## 2016-12-19 DIAGNOSIS — R011 Cardiac murmur, unspecified: Secondary | ICD-10-CM

## 2016-12-19 DIAGNOSIS — I4821 Permanent atrial fibrillation: Secondary | ICD-10-CM

## 2016-12-19 LAB — CBC WITH DIFFERENTIAL/PLATELET
Basophils Absolute: 0 10*3/uL (ref 0.0–0.1)
Basophils Relative: 0 %
Eosinophils Absolute: 0.2 10*3/uL (ref 0.0–0.7)
Eosinophils Relative: 2 %
HCT: 38 % (ref 36.0–46.0)
Hemoglobin: 12.5 g/dL (ref 12.0–15.0)
Lymphocytes Relative: 20 %
Lymphs Abs: 1.6 10*3/uL (ref 0.7–4.0)
MCH: 26.7 pg (ref 26.0–34.0)
MCHC: 32.9 g/dL (ref 30.0–36.0)
MCV: 81.2 fL (ref 78.0–100.0)
Monocytes Absolute: 0.8 10*3/uL (ref 0.1–1.0)
Monocytes Relative: 10 %
Neutro Abs: 5.5 10*3/uL (ref 1.7–7.7)
Neutrophils Relative %: 68 %
Platelets: 339 10*3/uL (ref 150–400)
RBC: 4.68 MIL/uL (ref 3.87–5.11)
RDW: 16.6 % — ABNORMAL HIGH (ref 11.5–15.5)
WBC: 8.2 10*3/uL (ref 4.0–10.5)

## 2016-12-19 LAB — BASIC METABOLIC PANEL
ANION GAP: 8 (ref 5–15)
BUN: 21 mg/dL — ABNORMAL HIGH (ref 6–20)
CHLORIDE: 97 mmol/L — AB (ref 101–111)
CO2: 25 mmol/L (ref 22–32)
Calcium: 8.9 mg/dL (ref 8.9–10.3)
Creatinine, Ser: 0.91 mg/dL (ref 0.44–1.00)
GFR calc non Af Amer: 53 mL/min — ABNORMAL LOW (ref >60)
Glucose, Bld: 122 mg/dL — ABNORMAL HIGH (ref 65–99)
POTASSIUM: 4.9 mmol/L (ref 3.5–5.1)
SODIUM: 130 mmol/L — AB (ref 135–145)

## 2016-12-19 LAB — BRAIN NATRIURETIC PEPTIDE: B Natriuretic Peptide: 1286 pg/mL — ABNORMAL HIGH (ref 0.0–100.0)

## 2016-12-19 NOTE — Telephone Encounter (Signed)
Sharrell Ku PA-C will address isuses with pt at visit today, has number for NP at [facility]

## 2016-12-19 NOTE — Progress Notes (Signed)
Please call patient's SNF. Labs indicate she may have excess volume on board with low sodium and elevated BNP. This could be causing her SOB and cough. Would increase Lasix to 80mg  QAM/40mg  QPM for 3 days then go back to 40mg  BID. Please call if weight does not return to around 115 with this regimen, or if cough/SOB continues. Since we are increasing diuretic I would recommend moving f/u to 1 week. Dayna Dunn PA-C

## 2016-12-19 NOTE — Progress Notes (Signed)
Cardiology Office Note    Date:  12/19/2016  ID:  [name], DOB [birth year], MRN [id number] PCP:  Celene Squibb, MD  Cardiologist:  Dr. Harrington Challenger   Chief Complaint: shortness of breath  History of Present Illness:  [name] is a 83 y.o. female with history of permanent-appearing atrial fib, CAD s/p DES to mLAD 01/736, chronic diastolic CHF, dementia, wheelchair bound, chronic hoarseness, breast CA, HTN, hypothyroidism, prior MVP (mild MR on echo 2017), peripheral neuropathy, hyponatremia who presents for evaluation of SOB.   Last cath 12/2002 showed high grade LAD stenosis treated with DES, also had residual CAD treated medically at that time with 50% + 70-80% mid-septal perforator, 80% D1, 70% OM1, 50% ostial RCA, 25% mRCA. 2D Echo 03/2016 showed focal basal hypertrophy, EF 60-65%, moderately calcified MV/AV annulus, mild MR, massive LAE, severe RAE, PASP 47, small pericardial effusion.   In 09/2016 she was admitted to [facility] in the setting of decompensated CHF, pulmonary edema, in February of 2018, with need for BiPAP, IV diuresis. She was found to be bradycardic in the setting of digoxin level of 1.6. BB/CCB/digoxin were held. Diltiazem was reintroduced at lower dose of 120 mg daily. She is also on bisoprolol.  Eliquis was decreased to 2.5 mg BID due to age and weight. Discharge weight 112 lbs. She was discharged to [facility]. More recently she was readmitted 5/1-12/11/16 with tachycardia, respiratory difficulty, and low grade fever without obvious source of infection - UCx and BLood Cx unremarkable. Was given 1 L fluid due to elevated BUN 21. Initially required cardizem drip which was transitioned back to oral form. Low grade troponin elevation up to 0.12 felt due to demand ischemia. Most recent labs showed K 3.4, Cr 0.82, Hgb 12.5, TSH wnl.  She presents back for follow-up today from SNF. She states she thought today was for a check-up. Per notes, someone from her facility had called reporting  increased SOB and tachycardia but we did not get a return call back. The patient reports intermittently earlier this week she would have increased SOB which would improve with breathing treatment. She states one night was "really bad." She's had a cough for several weeks. Today, however, she feels that both her breathing and cough are slightly better. No chest pain. She has chronic mild LEE which patient and son says is stable. Appetite is fair. Memory is getting worse. No recent falls. Med list from SNF discrepant from recent med list from hospital; Imdur is being given at 30mg  daily.   Past Medical History:  Diagnosis Date  . Arteriosclerotic cardiovascular disease (ASCVD)    a. Cath 12/2002 showed high grade LAD stenosis treated with DES, also had residual CAD treated medically at that time with 50% + 70-80% mid-septal perforator, 80% D1, 70% OM1, 50% ostial RCA, 25% mRCA.  . Bronchitis    history  . Carcinoma of breast ([place])    left masectomy in 1995  . Chronic diastolic heart failure ([place]) 11/21/2009  . Chronic hoarseness   . CVD (cerebrovascular disease)    plaque w/o focal disease in 2006; h/o CVA  . Dementia   . Diabetes mellitus type II    no insulin   . Edema   . Herpes zoster   . Hypertension   . Hypothyroid   . Lower extremity edema 08/20/2011  . MVP (mitral valve prolapse)    moderate; with moderate MR  . Osteoporosis   . Peripheral neuropathy   . Permanent atrial  fibrillation ([place])   . PVD (peripheral vascular disease) (HCC)    ABIs of 0.64 and 0.59, right and left leg in 2009  . 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 Shingles   . Ulcer    left lower leg  . Varicose veins of legs 08/20/2011    Past Surgical History:  Procedure Laterality Date  . ABDOMINAL HYSTERECTOMY     abd?  . CATARACT EXTRACTION, BILATERAL    . CHOLECYSTECTOMY  2006  . COLONOSCOPY  Date unknown  . CORONARY ANGIOPLASTY WITH STENT PLACEMENT    . left masectomy  1995  . ORIF of left hip  05/22/06    [name]    Current Medications: Current Outpatient Prescriptions  Medication Sig Dispense Refill  . apixaban (ELIQUIS) 2.5 MG TABS tablet Take 1 tablet (2.5 mg total) by mouth 2 (two) times daily. 60 tablet 2  . bisoprolol (ZEBETA) 5 MG tablet 5 mg.    . calcium carbonate (TUMS - DOSED IN MG ELEMENTAL CALCIUM) 500 MG chewable tablet Chew 1 tablet by mouth 3 (three) times daily before meals.    . cholecalciferol (VITAMIN D) 1000 units tablet Take 1,000 Units by mouth daily.    Marland Kitchen diltiazem (CARDIZEM CD) 120 MG 24 hr capsule Take 1 capsule (120 mg total) by mouth daily. 30 capsule 2  . furosemide (LASIX) 20 MG tablet Take 2 tablets (40 mg total) by mouth 2 (two) times daily.    Marland Kitchen gabapentin (NEURONTIN) 100 MG capsule Take 200 mg by mouth every 8 (eight) hours.     . isosorbide mononitrate (IMDUR) 30 MG 24 hr tablet Take 0.5 tablets (15 mg total) by mouth daily. (Patient taking differently: Take 30 mg by mouth daily. )    . levothyroxine (SYNTHROID, LEVOTHROID) 25 MCG tablet Take 25 mcg by mouth every evening.     Marland Kitchen lisinopril (PRINIVIL,ZESTRIL) 20 MG tablet Take 1 tablet (20 mg total) by mouth daily.    . metFORMIN (GLUCOPHAGE) 500 MG tablet Take 500 mg by mouth 2 (two) times daily with a meal.    . potassium chloride SA (K-DUR,KLOR-CON) 20 MEQ tablet Take 2 tablets (40 mEq total) by mouth daily.    Marland Kitchen acetaminophen (TYLENOL) 325 MG tablet Take 650 mg by mouth 2 (two) times daily.    Marland Kitchen atorvastatin (LIPITOR) 20 MG tablet Take 20 mg by mouth daily.     No current facility-administered medications for this visit.      Allergies:   Patient has no known allergies.   Social History   Social History  . Marital status: Widowed    Spouse name: N/A  . Number of children: N/A  . Years of education: N/A   Social History Main Topics  . Smoking status: Never Smoker  . Smokeless tobacco: Never Used     Comment: tobacco use - no   . Alcohol use No  . Drug use: No  . Sexual activity: Not Asked     Other Topics Concern  . None   Social History Narrative   Widowed, retired, does not get regular exercise.      Family History:  Family History  Problem Relation Age of Onset  . Diabetes Mother     ROS:   Please see the history of present illness.  All other systems are reviewed and otherwise negative.    PHYSICAL EXAM:   VS:  BP 122/80   Pulse 87   Wt 118 lb (53.5 kg)   SpO2 93%  BMI 19.05 kg/m   BMI: Body mass index is 19.05 kg/m. GEN: Frail appearing elderly WF, in no acute distress  HEENT: normocephalic, atraumatic Neck: no JVD, carotid bruits, or masses Cardiac: irregularly irregular, rate controlled, 3/6 SEM focused at apex. No rubs, or gallops, mild bilateral LE edema with chronic skin thickening Respiratory: faint crackles at bases bilaterally, normal work of breathing GI: soft, nontender, nondistended, + BS MS: kyphotic posture, limited mobility, needs 2 person assist with even minimal tasks Skin: warm and dry, no rash Neuro:  Alert and Oriented x 3, Strength and sensation are intact, follows commands Psych: euthymic mood, full affect  Wt Readings from Last 3 Encounters:  12/19/16 118 lb (53.5 kg)  12/10/16 119 lb (54 kg)  11/14/16 114 lb (51.7 kg)      Studies/Labs Reviewed:   EKG:  EKG was ordered today and personally reviewed by me and demonstrates atrial fib 95bpm, TWI III, avF, V3-V4 (slightly biphasic appearance)  Recent Labs: 09/27/2016: B Natriuretic Peptide 523.0; Magnesium 2.0 12/10/2016: ALT 40; Hemoglobin 12.5; Platelets 227; TSH 0.465 12/11/2016: BUN 18; Creatinine, Ser 0.82; Potassium 3.4; Sodium 136    Additional studies/ records that were reviewed today include: Summarized above.    ASSESSMENT & PLAN:   1. Shortness of breath - this waxes and wanes per patient, may represent some degree of fluid overload but also may represent sequelae of possible recent bronchitis (5/1-5/2 admission did not reveal any PNA but she had cough and low  grade fever). Rate control is adequate on present regimen. Will check BNP along with recheck BMET (given recent hypokalemia) and CBC (to exclude interim development of anemia contributing to symptoms). It appears that there is a SNF order to fax weights to our office weekly but I do not have a copy of this. Her weight is up approximately 4lb from prior office weights 11/2016 but stable compared to recent hospital stay when she was felt to be dry and given IV fluid. Will need to follow for now. Please continue to fax weights for Dr. Alan Ripper review weekly. Please call ASAP if increase in weight of 3lb overnight or 5lb in span of several days, increased shortness of breath, swelling or orthopnea. I will review labs to decide further recommendations on either leaving medication at present regimen or gently titrating Lasix. 2. Permanent atrial fib - rate control adequate today. Continue current regimen. Would avoid any future digoxin in this patient given advanced age and h/o elevated digoxin level. This is no longer being given per med rec. 3. Chronic diastolic CHF - see above. F/u labs. 4. CAD - no recent angina per patient. Her Imdur is listed as 30mg  on her SNF med list. Will continue this present dose since she is feeling better today than recent hospital stay. 5. Systolic murmur - prior mitral valve calcification seen on echo in 2017. Murmur also mentioned in prior notes. Given advanced age, comorbidities including dementia, it seems appropriate to follow this clinically for now with monitoring of her CHF.  Disposition: F/u with Dr. Elon Jester in 1 month.   Medication Adjustments/Labs and Tests Ordered: Current medicines are reviewed at length with the patient today.  Concerns regarding medicines are outlined above. Medication changes, Labs and Tests ordered today are summarized above and listed in the Patient Instructions accessible in Encounters.   Signed, Melina Copa PA-C  12/19/2016 2:15 PM    [facility] Location in [place],  [place] [address] Ph: [phone]; Fax [phone]

## 2016-12-19 NOTE — Patient Instructions (Signed)
Your physician recommends that you schedule a follow-up appointment in: 1 Month  Your physician recommends that you continue on your current medications as directed. Please refer to the Current Medication list given to you today.  Your physician has requested that you regularly monitor and record your blood pressure readings at home. Please use the same machine at the same time of day to check your readings and record them to bring to your follow-up visit.  Call office with weights and Blood Pressure Weekly   Your physician recommends that you return for lab work in: Today.   If you need a refill on your cardiac medications before your next appointment, please call your pharmacy.  Thank you for choosing [facility]!

## 2016-12-20 ENCOUNTER — Telehealth: Payer: Self-pay | Admitting: *Deleted

## 2016-12-20 NOTE — Telephone Encounter (Signed)
Bartolo Darter, LPN notified of test results

## 2016-12-20 NOTE — Telephone Encounter (Signed)
-----   Message from Charlie Pitter, [place] sent at 12/19/2016  4:17 PM EDT ----- Please call patient's SNF. Labs indicate she may have excess volume on board with low sodium and elevated BNP. This could be causing her SOB and cough. Would increase Lasix to 80mg  QAM/40mg  QPM for 3 days then go back to 40mg  BID. Please call if weight does not return to around 115 with this regimen, or if cough/SOB continues. Since we are increasing diuretic I would recommend moving f/u to 1 week. Dayna Dunn PA-C

## 2016-12-27 ENCOUNTER — Encounter: Payer: Self-pay | Admitting: Physician Assistant

## 2016-12-27 ENCOUNTER — Ambulatory Visit (INDEPENDENT_AMBULATORY_CARE_PROVIDER_SITE_OTHER): Payer: Medicare Other | Admitting: Physician Assistant

## 2016-12-27 VITALS — BP 128/78 | HR 84 | Ht 60.0 in | Wt 114.0 lb

## 2016-12-27 DIAGNOSIS — I2510 Atherosclerotic heart disease of native coronary artery without angina pectoris: Secondary | ICD-10-CM | POA: Diagnosis not present

## 2016-12-27 DIAGNOSIS — I5033 Acute on chronic diastolic (congestive) heart failure: Secondary | ICD-10-CM | POA: Diagnosis not present

## 2016-12-27 DIAGNOSIS — R011 Cardiac murmur, unspecified: Secondary | ICD-10-CM | POA: Diagnosis not present

## 2016-12-27 DIAGNOSIS — I4821 Permanent atrial fibrillation: Secondary | ICD-10-CM

## 2016-12-27 DIAGNOSIS — I482 Chronic atrial fibrillation: Secondary | ICD-10-CM | POA: Diagnosis not present

## 2016-12-27 DIAGNOSIS — E871 Hypo-osmolality and hyponatremia: Secondary | ICD-10-CM

## 2016-12-27 MED ORDER — FUROSEMIDE 40 MG PO TABS: 40.0000 mg | ORAL_TABLET | Freq: Two times a day (BID) | ORAL | 3 refills | Status: DC

## 2016-12-27 NOTE — Progress Notes (Signed)
Cardiology Office Note    Date:  12/27/2016  ID:  [name], DOB 09/30/1920, MRN [id number] PCP:  Celene Squibb, MD  Cardiologist:  Dr. Harrington Challenger  Chief Complaint: f/u shortness of breath and elevated BNP  History of Present Illness:  MARSHE [name] is a 83 y.o. 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  Last cath 12/2002 showed high grade LAD stenosis treated with DES, also had residual CAD treated medically at that time with 50% + 70-80% mid-septal perforator, 80% D1, 70% OM1, 50% ostial RCA, 25% mRCA. 2D Echo 03/2016 showed focal basal hypertrophy, EF 60-65%, moderately calcified MV/AV annulus, mild MR, massive LAE, severe RAE, PASP 47, small pericardial effusion.   In 09/2016 she was admitted to [facility] in the setting of decompensated CHF, pulmonary edema with need for BiPAP, IV diuresis. She was found to be bradycardic in the setting of digoxin level of 1.6. BB/CCB/digoxin were held. Diltiazem was reintroduced at lower dose of 120 mg daily. She is also on bisoprolol. Eliquis was decreased to 2.5 mg BID due to age and weight. Discharge weight 112 lbs. She was discharged to [facility]. More recently she was readmitted 5/1-12/11/16 with tachycardia, respiratory difficulty, and low grade fever without obvious source of infection - UCx and BLood Cx unremarkable. Was given 1 L fluid due to elevated BUN 21. Initially required cardizem drip which was transitioned back to oral form. Low grade troponin elevation up to 0.12 felt due to demand ischemia. Most recent labs showed K 3.4, Cr 0.82, Hgb 12.5, TSH wnl.  I saw her back in f/u from SNF 12/19/16 at which her facility had called reporting  increased SOB and tachycardia but we did not get a return call back. The patient did report feeling increased SOB that would improve with breathing treatments, also cited one night was "really bad." Reported occasional nonproductive cough. At time of that OV, she was fairly comfortable. Weight was 118 (with prior dry weight somewhere around 114-115).  Reported fair appetite, worsening memory in general. Labs showed BNP 1286, Hgb 12.5, Na 130, K 4.9, BUN 21, Cr 0.91. She was asked to increase Lasix to 80mg  QAM/40mg  QPM then resume 40mg  BID.  She returns for follow-up today with a nurse aid from her home. She does not know why she's here today. She thinks her breathing has improved. Does not recall any further episodes of orthopnea. Cough improved. She feels edema is at baseline. Weight is back to 114.   Past Medical History:  Diagnosis Date  . Arteriosclerotic cardiovascular disease (ASCVD)    a. Cath 12/2002 showed high grade LAD stenosis treated with DES, also had residual CAD treated medically at that time with 50% + 70-80% mid-septal perforator, 80% D1, 70% OM1, 50% ostial RCA, 25% mRCA.  . Bronchitis    history  . Carcinoma of breast ([place])    left masectomy in 1995  . Chronic diastolic heart failure ([place]) 11/21/2009  . Chronic hoarseness   . CKD (chronic kidney disease), stage II    per historical labs for age, weight, Cr  . CVD (cerebrovascular disease)    plaque w/o focal disease in 2006; h/o CVA  . Dementia   . Diabetes mellitus type II  no insulin   . Edema   . Herpes zoster   . Hypertension   . Hypothyroid   . Lower extremity edema 08/20/2011  . MVP (mitral valve prolapse)    moderate; with moderate MR  . Osteoporosis   . Peripheral neuropathy   . Permanent atrial fibrillation ([place])   . PVD (peripheral vascular disease) (HCC)    ABIs of 0.64 and 0.59, right and left leg in 2009  . 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 Shingles   . Ulcer    left lower leg  . Varicose veins of legs  08/20/2011    Past Surgical History:  Procedure Laterality Date  . ABDOMINAL HYSTERECTOMY     abd?  . CATARACT EXTRACTION, BILATERAL    . CHOLECYSTECTOMY  2006  . COLONOSCOPY  Date unknown  . CORONARY ANGIOPLASTY WITH STENT PLACEMENT    . left masectomy  1995  . ORIF of left hip  05/22/06   [name]    Current Medications: Outpatient Medications Prior to Visit  Medication Sig Dispense Refill  . acetaminophen (TYLENOL) 325 MG tablet Take 650 mg by mouth 2 (two) times daily.    Marland Kitchen apixaban (ELIQUIS) 2.5 MG TABS tablet Take 1 tablet (2.5 mg total) by mouth 2 (two) times daily. 60 tablet 2  . atorvastatin (LIPITOR) 20 MG tablet Take 20 mg by mouth daily.    . calcium carbonate (TUMS - DOSED IN MG ELEMENTAL CALCIUM) 500 MG chewable tablet Chew 1 tablet by mouth 3 (three) times daily before meals.    . cholecalciferol (VITAMIN D) 1000 units tablet Take 1,000 Units by mouth daily.    Marland Kitchen diltiazem (CARDIZEM CD) 120 MG 24 hr capsule Take 1 capsule (120 mg total) by mouth daily. 30 capsule 2  . gabapentin (NEURONTIN) 100 MG capsule Take 200 mg by mouth every 8 (eight) hours.     . isosorbide mononitrate (IMDUR) 30 MG 24 hr tablet Take 30 mg by mouth daily.    Marland Kitchen levothyroxine (SYNTHROID, LEVOTHROID) 25 MCG tablet Take 25 mcg by mouth every evening.     Marland Kitchen lisinopril (PRINIVIL,ZESTRIL) 20 MG tablet Take 1 tablet (20 mg total) by mouth daily.    . metFORMIN (GLUCOPHAGE) 500 MG tablet Take 500 mg by mouth 2 (two) times daily with a meal.    . potassium chloride SA (K-DUR,KLOR-CON) 20 MEQ tablet Take 2 tablets (40 mEq total) by mouth daily.    . furosemide (LASIX) 20 MG tablet Take 2 tablets (40 mg total) by mouth 2 (two) times daily.     No facility-administered medications prior to visit.      Allergies:   Patient has no known allergies.   Social History   Social History  . Marital status: Widowed    Spouse name: N/A  . Number of children: N/A  . Years of education: N/A   Social History  Main Topics  . Smoking status: Never Smoker  . Smokeless tobacco: Never Used     Comment: tobacco use - no   . Alcohol use No  . Drug use: No  . Sexual activity: Not Asked   Other Topics Concern  . None   Social History Narrative   Widowed, retired, does not get regular exercise.      Family History:  Family History  Problem Relation Age of Onset  . Diabetes Mother     ROS:   Please see the history of present illness. All other systems are reviewed and otherwise negative.  PHYSICAL EXAM:   VS:  BP 128/78   Pulse 84   Ht 5' (1.524 m)   Wt 114 lb (51.7 kg)   SpO2 99%   BMI 22.26 kg/m   BMI: Body mass index is 22.26 kg/m. GEN: Frail appearing elderly WF, in no acute distress  HEENT: normocephalic, atraumatic Neck: no JVD, carotid bruits, or masses Cardiac: Irregularly irregular, rate controlled, 3/6 SEM, no rubs or gallops, tr-1+ bilateral pretibial woody edema  Respiratory:  clear to auscultation bilaterally, normal work of breathing GI: soft, nontender, nondistended, + BS MS: no deformity or atrophy  Skin: warm and dry, no rash Neuro:  Alert and conversant but poor memory overall, knows self and place, Strength and sensation are intact, follows commands Psych: euthymic mood, full affect  Wt Readings from Last 3 Encounters:  12/27/16 114 lb (51.7 kg)  12/19/16 118 lb (53.5 kg)  12/10/16 119 lb (54 kg)      Studies/Labs Reviewed:   EKG:   EKG was not ordered today  Recent Labs: 09/27/2016: Magnesium 2.0 12/10/2016: ALT 40; TSH 0.465 12/19/2016: B Natriuretic Peptide 1,286.0; BUN 21; Creatinine, Ser 0.91; Hemoglobin 12.5; Platelets 339; Potassium 4.9; Sodium 130   Lipid Panel   Additional studies/ records that were reviewed today include: Summarized above.    ASSESSMENT & PLAN:   1. Acute on chronic diastolic CHF - improved. Continues with some dependent-appearing edema (had discussed with patient and son at last visit and this has been chronic for  her). Weight is back down to 114. Need to be cautious with diuretic given advanced age, prior hyponatremia, tendency towards pre-renal kidney function. Would recommend to continue Lasix 40mg  BID but may take 1 additional tablet daily as needed for weight increase to 116 or above, increased edema from baseline or shortness of breath. I spoke with tech and encouraged elevation of legs whenever possible and using compression hose. 2. Permanent atrial fibrillation - rate control acceptable today. No recent falls. Continue low dose Eliquis. 3. Coronary artery disease - no recent angina. Follow for now. 4. Hyponatremia - waxes and wanes - suspect recent value lower in setting of volume overload. 5. Systolic murmur - see last OV. Prior mitral valve calcification seen on echo in 2017, also noted have murmur on prior exams. Given advanced age, comorbidities including dementia, it seems appropriate to follow this clinically for now with monitoring of her CHF. Do not see benefit of updating echo at this time.  Disposition: F/u with Dr. Harrington Challenger in 6-8 weeks, sooner if any new issues arise.   Medication Adjustments/Labs and Tests Ordered: Current medicines are reviewed at length with the patient today.  Concerns regarding medicines are outlined above. Medication changes, Labs and Tests ordered today are summarized above and listed in the Patient Instructions accessible in Encounters.   Signed, Charlie Pitter, PA-C  12/27/2016 3:50 PM    [facility] Location in [place]. [place], [place] [address] Ph: [phone]; Fax [phone]

## 2016-12-27 NOTE — Patient Instructions (Signed)
Medication Instructions:  CONTINUE TAKING LASIX 40 MG TWO TIMES DAILY, CAN TAKE 1 EXTRA TABLET DAILY AS NEEDED FOR WEIGHT 116 OR ABOVE , WORSENING SHORTNESS OF BREATH OR EDEMA  Labwork: NONE  Testing/Procedures: NONE  Follow-Up: Your physician recommends that you schedule a follow-up appointment in: 6-8 WEEKS   Any Other Special Instructions Will Be Listed Below (If Applicable).     If you need a refill on your cardiac medications before your next appointment, please call your pharmacy.

## 2017-01-02 ENCOUNTER — Telehealth: Payer: Self-pay | Admitting: Internal Medicine

## 2017-01-02 NOTE — Telephone Encounter (Signed)
Charma Igo LPN at [facility] has a question about the orders for Ms. Ventrella compression hose

## 2017-01-02 NOTE — Telephone Encounter (Signed)
Returned call,nurse at lunch,she will call us back.

## 2017-01-07 NOTE — Telephone Encounter (Signed)
Nurse Ronnald Ramp was off today. I spoke with nurse [place] and she saw order for compression stockings but had no questions.i told her they can call back if needed

## 2017-01-29 NOTE — Progress Notes (Signed)
Cardiology Office Note   Date:  01/30/2017   ID:  [name], [name] 1920/09/21, MRN [id number]  PCP:  Celene Squibb, MD  Cardiologist:   Dorris Carnes, MD    Pt presents for f/u of diastolic CHF    History of Present Illness: [name] is a 83 y.o. female with a history of atrail fib  CAD (s/p DES to LAD in 2004), chronic diastolic CHF, dementi, HTN, hypothyroidism    I saw her in December 2017  She has been seen by Arnold Long and D Dunn (last time 12/27/16 Last cath 12/2002 showed high grade LAD stenosis treated with DES, also had residual CAD treated medically at that time with 50% + 70-80% mid-septal perforator, 80% D1, 70% OM1, 50% ostial RCA, 25% mRCA. 2D Echo 03/2016 showed focal basal hypertrophy, EF 60-65%, moderately calcified MV/AV annulus, mild MR, massive LAE, severe RAE, PASP 47, small pericardial effusion  She was admitted to [facility] in Feb in CHF and bradycardic  Meds adjusted   Placed on Eliquis.  REadmitted on 5/1 with elevated HR , low grade fever, SOB  Cardiazem added. Dry wt 114 to 115.   Seen by Royden Purl in May  Confused at time  Breathing was OK    Breathing is OK  Legs hurt more swollen Pt says diet is unchanged   Denies CP   Denies palpiation     Current Meds  Medication Sig  . acetaminophen (TYLENOL) 325 MG tablet Take 650 mg by mouth 2 (two) times daily.  Marland Kitchen apixaban (ELIQUIS) 2.5 MG TABS tablet Take 1 tablet (2.5 mg total) by mouth 2 (two) times daily.  Marland Kitchen atorvastatin (LIPITOR) 20 MG tablet Take 20 mg by mouth daily.  . calcium carbonate (TUMS - DOSED IN MG ELEMENTAL CALCIUM) 500 MG chewable tablet Chew 1 tablet by mouth 3 (three) times daily before meals.  . cholecalciferol (VITAMIN D) 1000 units tablet Take 1,000 Units by mouth daily.  Marland Kitchen diltiazem (CARDIZEM CD) 120 MG 24 hr capsule Take 1 capsule (120 mg total) by mouth daily.  . furosemide (LASIX) 40 MG tablet Take 1 tablet (40 mg total) by mouth 2 (two) times daily.  Marland Kitchen gabapentin (NEURONTIN) 100 MG capsule Take  200 mg by mouth every 8 (eight) hours.   . isosorbide mononitrate (IMDUR) 30 MG 24 hr tablet Take 30 mg by mouth daily.  Marland Kitchen levothyroxine (SYNTHROID, LEVOTHROID) 25 MCG tablet Take 25 mcg by mouth every evening.   Marland Kitchen lisinopril (PRINIVIL,ZESTRIL) 20 MG tablet Take 1 tablet (20 mg total) by mouth daily.  . metFORMIN (GLUCOPHAGE) 500 MG tablet Take 500 mg by mouth 2 (two) times daily with a meal.  . potassium chloride SA (K-DUR,KLOR-CON) 20 MEQ tablet Take 2 tablets (40 mEq total) by mouth daily.     Allergies:   Patient has no known allergies.   Past Medical History:  Diagnosis Date  . Arteriosclerotic cardiovascular disease (ASCVD)    a. Cath 12/2002 showed high grade LAD stenosis treated with DES, also had residual CAD treated medically at that time with 50% + 70-80% mid-septal perforator, 80% D1, 70% OM1, 50% ostial RCA, 25% mRCA.  . Bronchitis    history  . Carcinoma of breast ([place])    left masectomy in 1995  . Chronic diastolic heart failure ([place]) 11/21/2009  . Chronic hoarseness   . CKD (chronic kidney disease), stage II    per historical labs for age, weight, Cr  . CVD (cerebrovascular disease)  plaque w/o focal disease in 2006; h/o CVA  . Dementia   . Diabetes mellitus type II    no insulin   . Edema   . Herpes zoster   . Hypertension   . Hypothyroid   . Lower extremity edema 08/20/2011  . MVP (mitral valve prolapse)    moderate; with moderate MR  . Osteoporosis   . Peripheral neuropathy   . Permanent atrial fibrillation ([place])   . PVD (peripheral vascular disease) (HCC)    ABIs of 0.64 and 0.59, right and left leg in 2009  . 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 Shingles   . Ulcer    left lower leg  . Varicose veins of legs 08/20/2011    Past Surgical History:  Procedure Laterality Date  . ABDOMINAL HYSTERECTOMY     abd?  . CATARACT EXTRACTION, BILATERAL    . CHOLECYSTECTOMY  2006  . COLONOSCOPY  Date unknown  . CORONARY ANGIOPLASTY WITH STENT PLACEMENT    . left  masectomy  1995  . ORIF of left hip  05/22/06   [name]     Social History:  The patient  reports that she has never smoked. She has never used smokeless tobacco. She reports that she does not drink alcohol or use drugs.   Family History:  The patient's family history includes Diabetes in her mother.    ROS:  Please see the history of present illness. All other systems are reviewed and  Negative to the above problem except as noted.    PHYSICAL EXAM: VS:  BP 112/72   Pulse 90   Ht 5\' 2"  (1.575 m)   Wt 54.9 kg (121 lb)   SpO2 96%   BMI 22.13 kg/m   GEN: Well nourished, well developed, in no acute distress  HEENT: normal  Neck: JVP is increased  , carotid bruits, or masses Cardiac: Irreg  ; Gr II/VIsystllic murmur LSB to base  No, rubs, or gallops, 2+ edema with chronic erythma Respiratory:  clear to auscultation bilaterally, normal work of breathing  Mild rales at bases GI: soft, nontender, nondistended, + BS  No hepatomegaly  MS: no deformity Moving all extremities   Skin: warm and dry, no rash Neuro:  Strength and sensation are intact Psych: euthymic mood, full affect   EKG:  EKG is ordered today.   Lipid Panel    Component Value Date/Time   CHOL  10/19/2010 0615    120        ATP III CLASSIFICATION:  <200     mg/dL   Desirable  200-239  mg/dL   Borderline High  >=240    mg/dL   High          TRIG 74 10/19/2010 0615   HDL 39 (L) 10/19/2010 0615   CHOLHDL 3.1 10/19/2010 0615   VLDL 15 10/19/2010 0615   LDLCALC  10/19/2010 0615    66        Total Cholesterol/HDL:CHD Risk Coronary Heart Disease Risk Table                     Men   Women  1/2 Average Risk   3.4   3.3  Average Risk       5.0   4.4  2 X Average Risk   9.6   7.1  3 X Average Risk  23.4   11.0        Use the calculated Patient Ratio above and the CHD Risk  Table to determine the patient's CHD Risk.        ATP III CLASSIFICATION (LDL):  <100     mg/dL   Optimal  100-129  mg/dL   Near or  Above                    Optimal  130-159  mg/dL   Borderline  160-189  mg/dL   High  >190     mg/dL   Very High      Wt Readings from Last 3 Encounters:  01/30/17 54.9 kg (121 lb)  12/27/16 51.7 kg (114 lb)  12/19/16 53.5 kg (118 lb)      ASSESSMENT AND PLAN:  1  Acute on chronic diastolic CHF  Volume status is up on exam  Wt is up 3 lbs from May   I would recomm checking BMET and BNP today  I would for now increase lasix to 60 mg am and 40 pm Check BMET and BNP in 2 wks   If labs today signif abnormal from previous will contact pt /[facility] for further recommendations  Will set f/u for July 2018  I think she will need very close outpt f/u to avoid readmission  2.  Atrial fibrillation  Continue on rate control and anticoagulation.  F/U in July   Current medicines are reviewed at length with the patient today.  The patient does not have concerns regarding medicines.  Signed, Dorris Carnes, MD  01/30/2017 1:21 PM    [facility] [address], [place], [place]  [address] Phone: [phone]; Fax: [phone]

## 2017-01-30 ENCOUNTER — Ambulatory Visit (INDEPENDENT_AMBULATORY_CARE_PROVIDER_SITE_OTHER): Payer: Medicare Other | Admitting: Internal Medicine

## 2017-01-30 ENCOUNTER — Ambulatory Visit: Payer: Medicare Other | Admitting: Internal Medicine

## 2017-01-30 ENCOUNTER — Encounter: Payer: Self-pay | Admitting: Internal Medicine

## 2017-01-30 VITALS — BP 112/72 | HR 90 | Ht 62.0 in | Wt 121.0 lb

## 2017-01-30 DIAGNOSIS — I4819 Other persistent atrial fibrillation: Secondary | ICD-10-CM

## 2017-01-30 DIAGNOSIS — I5032 Chronic diastolic (congestive) heart failure: Secondary | ICD-10-CM | POA: Diagnosis not present

## 2017-01-30 DIAGNOSIS — I481 Persistent atrial fibrillation: Secondary | ICD-10-CM | POA: Diagnosis not present

## 2017-01-30 DIAGNOSIS — I5033 Acute on chronic diastolic (congestive) heart failure: Secondary | ICD-10-CM | POA: Diagnosis not present

## 2017-01-30 MED ORDER — FUROSEMIDE 40 MG PO TABS: ORAL_TABLET | ORAL | 3 refills | Status: DC

## 2017-01-30 NOTE — Patient Instructions (Signed)
Your physician recommends that you schedule a follow-up appointment in:  1 month    INCREASE Lasix to 6 0 mg in the am ,and 40 mg in the afternoon    Get lab work TODAY and again in 2 weeks:BMET,BNP       Thank you for choosing [facility] !

## 2017-02-17 ENCOUNTER — Inpatient Hospital Stay (HOSPITAL_COMMUNITY)
Admission: EM | Admit: 2017-02-17 | Discharge: 2017-02-19 | DRG: 291 | Disposition: A | Discharge status: Skilled Nursing Facility | Payer: Medicare Other | Attending: Internal Medicine | Admitting: Internal Medicine

## 2017-02-17 ENCOUNTER — Emergency Department (HOSPITAL_COMMUNITY): Payer: Medicare Other

## 2017-02-17 ENCOUNTER — Encounter (HOSPITAL_COMMUNITY): Payer: Self-pay | Admitting: Emergency Medicine

## 2017-02-17 DIAGNOSIS — N182 Chronic kidney disease, stage 2 (mild): Secondary | ICD-10-CM | POA: Diagnosis present

## 2017-02-17 DIAGNOSIS — Z7901 Long term (current) use of anticoagulants: Secondary | ICD-10-CM

## 2017-02-17 DIAGNOSIS — L89151 Pressure ulcer of sacral region, stage 1: Secondary | ICD-10-CM | POA: Diagnosis present

## 2017-02-17 DIAGNOSIS — I5033 Acute on chronic diastolic (congestive) heart failure: Secondary | ICD-10-CM | POA: Diagnosis not present

## 2017-02-17 DIAGNOSIS — Z7984 Long term (current) use of oral hypoglycemic drugs: Secondary | ICD-10-CM

## 2017-02-17 DIAGNOSIS — E039 Hypothyroidism, unspecified: Secondary | ICD-10-CM | POA: Diagnosis present

## 2017-02-17 DIAGNOSIS — J9601 Acute respiratory failure with hypoxia: Secondary | ICD-10-CM | POA: Diagnosis present

## 2017-02-17 DIAGNOSIS — Z9012 Acquired absence of left breast and nipple: Secondary | ICD-10-CM

## 2017-02-17 DIAGNOSIS — I130 Hypertensive heart and chronic kidney disease with heart failure and stage 1 through stage 4 chronic kidney disease, or unspecified chronic kidney disease: Principal | ICD-10-CM | POA: Diagnosis present

## 2017-02-17 DIAGNOSIS — I2510 Atherosclerotic heart disease of native coronary artery without angina pectoris: Secondary | ICD-10-CM | POA: Diagnosis present

## 2017-02-17 DIAGNOSIS — E785 Hyperlipidemia, unspecified: Secondary | ICD-10-CM | POA: Diagnosis present

## 2017-02-17 DIAGNOSIS — Z8673 Personal history of transient ischemic attack (TIA), and cerebral infarction without residual deficits: Secondary | ICD-10-CM

## 2017-02-17 DIAGNOSIS — L8990 Pressure ulcer of unspecified site, unspecified stage: Secondary | ICD-10-CM | POA: Insufficient documentation

## 2017-02-17 DIAGNOSIS — I341 Nonrheumatic mitral (valve) prolapse: Secondary | ICD-10-CM | POA: Diagnosis present

## 2017-02-17 DIAGNOSIS — F0280 Dementia in other diseases classified elsewhere without behavioral disturbance: Secondary | ICD-10-CM | POA: Diagnosis present

## 2017-02-17 DIAGNOSIS — I509 Heart failure, unspecified: Secondary | ICD-10-CM

## 2017-02-17 DIAGNOSIS — J90 Pleural effusion, not elsewhere classified: Secondary | ICD-10-CM

## 2017-02-17 DIAGNOSIS — I4821 Permanent atrial fibrillation: Secondary | ICD-10-CM | POA: Diagnosis present

## 2017-02-17 DIAGNOSIS — I482 Chronic atrial fibrillation: Secondary | ICD-10-CM | POA: Diagnosis present

## 2017-02-17 DIAGNOSIS — I5031 Acute diastolic (congestive) heart failure: Secondary | ICD-10-CM | POA: Diagnosis present

## 2017-02-17 DIAGNOSIS — I10 Essential (primary) hypertension: Secondary | ICD-10-CM | POA: Diagnosis present

## 2017-02-17 DIAGNOSIS — Z955 Presence of coronary angioplasty implant and graft: Secondary | ICD-10-CM

## 2017-02-17 DIAGNOSIS — G3183 Dementia with Lewy bodies: Secondary | ICD-10-CM | POA: Diagnosis present

## 2017-02-17 DIAGNOSIS — I4891 Unspecified atrial fibrillation: Secondary | ICD-10-CM

## 2017-02-17 DIAGNOSIS — M810 Age-related osteoporosis without current pathological fracture: Secondary | ICD-10-CM | POA: Diagnosis present

## 2017-02-17 DIAGNOSIS — R0902 Hypoxemia: Secondary | ICD-10-CM | POA: Diagnosis not present

## 2017-02-17 DIAGNOSIS — Z79899 Other long term (current) drug therapy: Secondary | ICD-10-CM

## 2017-02-17 DIAGNOSIS — E1122 Type 2 diabetes mellitus with diabetic chronic kidney disease: Secondary | ICD-10-CM | POA: Diagnosis present

## 2017-02-17 DIAGNOSIS — Z853 Personal history of malignant neoplasm of breast: Secondary | ICD-10-CM

## 2017-02-17 DIAGNOSIS — E119 Type 2 diabetes mellitus without complications: Secondary | ICD-10-CM

## 2017-02-17 DIAGNOSIS — G9340 Encephalopathy, unspecified: Secondary | ICD-10-CM

## 2017-02-17 DIAGNOSIS — Z833 Family history of diabetes mellitus: Secondary | ICD-10-CM

## 2017-02-17 LAB — CBC WITH DIFFERENTIAL/PLATELET
BASOS ABS: 0 10*3/uL (ref 0.0–0.1)
Basophils Relative: 1 %
EOS PCT: 3 %
Eosinophils Absolute: 0.2 10*3/uL (ref 0.0–0.7)
HCT: 36.3 % (ref 36.0–46.0)
HEMOGLOBIN: 11.5 g/dL — AB (ref 12.0–15.0)
LYMPHS PCT: 19 %
Lymphs Abs: 1.2 10*3/uL (ref 0.7–4.0)
MCH: 25.9 pg — ABNORMAL LOW (ref 26.0–34.0)
MCHC: 31.7 g/dL (ref 30.0–36.0)
MCV: 81.8 fL (ref 78.0–100.0)
Monocytes Absolute: 0.8 10*3/uL (ref 0.1–1.0)
Monocytes Relative: 13 %
NEUTROS ABS: 4 10*3/uL (ref 1.7–7.7)
NEUTROS PCT: 64 %
PLATELETS: 233 10*3/uL (ref 150–400)
RBC: 4.44 MIL/uL (ref 3.87–5.11)
RDW: 18.4 % — ABNORMAL HIGH (ref 11.5–15.5)
WBC: 6.2 10*3/uL (ref 4.0–10.5)

## 2017-02-17 LAB — URINALYSIS, ROUTINE W REFLEX MICROSCOPIC
BILIRUBIN URINE: NEGATIVE
Glucose, UA: NEGATIVE mg/dL
Hgb urine dipstick: NEGATIVE
KETONES UR: NEGATIVE mg/dL
Leukocytes, UA: NEGATIVE
NITRITE: NEGATIVE
PROTEIN: NEGATIVE mg/dL
Specific Gravity, Urine: 1.009 (ref 1.005–1.030)
pH: 5 (ref 5.0–8.0)

## 2017-02-17 LAB — LACTIC ACID, PLASMA
LACTIC ACID, VENOUS: 1.8 mmol/L (ref 0.5–1.9)
Lactic Acid, Venous: 1.4 mmol/L (ref 0.5–1.9)

## 2017-02-17 LAB — COMPREHENSIVE METABOLIC PANEL
ALBUMIN: 2.9 g/dL — AB (ref 3.5–5.0)
ALT: 19 U/L (ref 14–54)
ANION GAP: 7 (ref 5–15)
AST: 23 U/L (ref 15–41)
Alkaline Phosphatase: 57 U/L (ref 38–126)
BUN: 27 mg/dL — ABNORMAL HIGH (ref 6–20)
CHLORIDE: 103 mmol/L (ref 101–111)
CO2: 29 mmol/L (ref 22–32)
Calcium: 8.4 mg/dL — ABNORMAL LOW (ref 8.9–10.3)
Creatinine, Ser: 1 mg/dL (ref 0.44–1.00)
GFR calc Af Amer: 54 mL/min — ABNORMAL LOW (ref >60)
GFR calc non Af Amer: 47 mL/min — ABNORMAL LOW (ref >60)
GLUCOSE: 79 mg/dL (ref 65–99)
POTASSIUM: 4.4 mmol/L (ref 3.5–5.1)
SODIUM: 139 mmol/L (ref 135–145)
TOTAL PROTEIN: 5.7 g/dL — AB (ref 6.5–8.1)
Total Bilirubin: 0.9 mg/dL (ref 0.3–1.2)

## 2017-02-17 LAB — TROPONIN I: Troponin I: <0.03 ng/mL (ref <0.03)

## 2017-02-17 LAB — MAGNESIUM: MAGNESIUM: 1.8 mg/dL (ref 1.7–2.4)

## 2017-02-17 LAB — BRAIN NATRIURETIC PEPTIDE: B Natriuretic Peptide: 1190 pg/mL — ABNORMAL HIGH (ref 0.0–100.0)

## 2017-02-17 MED ORDER — ISOSORBIDE MONONITRATE ER 60 MG PO TB24
30.0000 mg | ORAL_TABLET | Freq: Every day | ORAL | Status: DC
  Administered 2017-02-18 – 2017-02-19 (×2): 30 mg via ORAL
  Filled 2017-02-17 (×2): qty 1

## 2017-02-17 MED ORDER — POTASSIUM CHLORIDE CRYS ER 20 MEQ PO TBCR
40.0000 meq | EXTENDED_RELEASE_TABLET | Freq: Every day | ORAL | Status: DC
  Administered 2017-02-17 – 2017-02-19 (×3): 40 meq via ORAL
  Filled 2017-02-17 (×3): qty 2

## 2017-02-17 MED ORDER — APIXABAN 2.5 MG PO TABS
2.5000 mg | ORAL_TABLET | Freq: Two times a day (BID) | ORAL | Status: DC
  Administered 2017-02-17 – 2017-02-19 (×4): 2.5 mg via ORAL
  Filled 2017-02-17 (×7): qty 1

## 2017-02-17 MED ORDER — LEVOTHYROXINE SODIUM 25 MCG PO TABS
25.0000 ug | ORAL_TABLET | Freq: Every evening | ORAL | Status: DC
  Administered 2017-02-18 (×2): 25 ug via ORAL
  Filled 2017-02-17 (×2): qty 1

## 2017-02-17 MED ORDER — VITAMIN D 1000 UNITS PO TABS
1000.0000 [IU] | ORAL_TABLET | Freq: Every day | ORAL | Status: DC
  Administered 2017-02-17 – 2017-02-19 (×3): 1000 [IU] via ORAL
  Filled 2017-02-17 (×3): qty 1

## 2017-02-17 MED ORDER — MAGNESIUM SULFATE 2 GM/50ML IV SOLN
2.0000 g | Freq: Once | INTRAVENOUS | Status: AC
  Administered 2017-02-17: 2 g via INTRAVENOUS
  Filled 2017-02-17: qty 50

## 2017-02-17 MED ORDER — FUROSEMIDE 10 MG/ML IJ SOLN
20.0000 mg | Freq: Four times a day (QID) | INTRAMUSCULAR | Status: DC
  Administered 2017-02-17 – 2017-02-18 (×3): 20 mg via INTRAVENOUS
  Filled 2017-02-17 (×3): qty 2

## 2017-02-17 MED ORDER — CALCIUM CARBONATE ANTACID 500 MG PO CHEW
1.0000 | CHEWABLE_TABLET | Freq: Three times a day (TID) | ORAL | Status: DC
  Administered 2017-02-18 – 2017-02-19 (×4): 200 mg via ORAL
  Filled 2017-02-17 (×4): qty 1

## 2017-02-17 MED ORDER — FUROSEMIDE 10 MG/ML IJ SOLN
40.0000 mg | Freq: Once | INTRAMUSCULAR | Status: AC
  Administered 2017-02-17: 40 mg via INTRAVENOUS
  Filled 2017-02-17: qty 4

## 2017-02-17 MED ORDER — ONDANSETRON HCL 4 MG PO TABS: 4.0000 mg | ORAL_TABLET | Freq: Four times a day (QID) | ORAL | Status: DC | PRN

## 2017-02-17 MED ORDER — LEVALBUTEROL HCL 0.63 MG/3ML IN NEBU: 0.6300 mg | INHALATION_SOLUTION | Freq: Three times a day (TID) | RESPIRATORY_TRACT | Status: DC | PRN

## 2017-02-17 MED ORDER — ONDANSETRON HCL 4 MG/2ML IJ SOLN: 4.0000 mg | Freq: Four times a day (QID) | INTRAMUSCULAR | Status: DC | PRN

## 2017-02-17 MED ORDER — POLYETHYLENE GLYCOL 3350 17 G PO PACK
17.0000 g | PACK | Freq: Every day | ORAL | Status: DC
  Administered 2017-02-18 – 2017-02-19 (×2): 17 g via ORAL
  Filled 2017-02-17 (×2): qty 1

## 2017-02-17 MED ORDER — METFORMIN HCL 500 MG PO TABS
500.0000 mg | ORAL_TABLET | Freq: Two times a day (BID) | ORAL | Status: DC
  Administered 2017-02-18: 500 mg via ORAL
  Filled 2017-02-17: qty 1

## 2017-02-17 MED ORDER — GABAPENTIN 100 MG PO CAPS
200.0000 mg | ORAL_CAPSULE | Freq: Three times a day (TID) | ORAL | Status: DC
  Administered 2017-02-17 – 2017-02-19 (×5): 200 mg via ORAL
  Filled 2017-02-17 (×6): qty 2

## 2017-02-17 MED ORDER — LISINOPRIL 10 MG PO TABS
20.0000 mg | ORAL_TABLET | Freq: Every day | ORAL | Status: DC
  Administered 2017-02-18 – 2017-02-19 (×2): 20 mg via ORAL
  Filled 2017-02-17 (×2): qty 2

## 2017-02-17 MED ORDER — ATORVASTATIN CALCIUM 20 MG PO TABS
20.0000 mg | ORAL_TABLET | Freq: Every day | ORAL | Status: DC
  Administered 2017-02-18: 20 mg via ORAL
  Filled 2017-02-17: qty 1

## 2017-02-17 MED ORDER — APIXABAN 2.5 MG PO TABS
ORAL_TABLET | ORAL | Status: AC
  Filled 2017-02-17: qty 1

## 2017-02-17 MED ORDER — BISOPROLOL FUMARATE 5 MG PO TABS
5.0000 mg | ORAL_TABLET | Freq: Every day | ORAL | Status: DC
  Administered 2017-02-18: 5 mg via ORAL
  Filled 2017-02-17 (×2): qty 1

## 2017-02-17 MED ORDER — ACETAMINOPHEN 325 MG PO TABS
650.0000 mg | ORAL_TABLET | Freq: Two times a day (BID) | ORAL | Status: DC | PRN
  Administered 2017-02-18 (×2): 650 mg via ORAL
  Filled 2017-02-17 (×3): qty 2

## 2017-02-17 MED ORDER — DILTIAZEM HCL ER COATED BEADS 120 MG PO CP24
120.0000 mg | ORAL_CAPSULE | Freq: Every day | ORAL | Status: DC
  Administered 2017-02-18 – 2017-02-19 (×2): 120 mg via ORAL
  Filled 2017-02-17 (×2): qty 1

## 2017-02-17 NOTE — ED Notes (Signed)
Pt from [facility].  Staff reports noticed pt was a little disoriented and weak yesterday.  Today reports bp in 83'J systolic and 02 sat in 82'N on room air.   Reports pt is not usually on 02.  Pt has history of chf.

## 2017-02-17 NOTE — H&P (Signed)
History and Physical    [name] STM:[id number] DOB: May 11, 1921 DOA: 02/17/2017  PCP: Hilbert Corrigan, MD   Patient coming from: [place]  I have personally briefly reviewed patient's old medical records in [place]  Chief Complaint: Hypoxia and hypotension.  HPI: [name] is a 83 y.o. female with medical history significant of CAD, hypertension, chronic diastolic heart failure, mitral valve prolapse, chronic atrial fibrillation, cerebrovascular disease, dementia, history of bronchitis, chronic kidney disease, type 2 diabetes, history of herpes zoster, hypothyroidism, osteoporosis, peripheral neuropathy, peripheral vascular disease, osteoarthritis, varicose veins who was brought from the nursing home due to progressively worse mental status change with confusion since yesterday, hypoxia and hypotension earlier today. There has not been fever, no productive cough, urinary symptoms, nausea, vomiting or diarrhea. She is unable to provide further history at this time.   ED Course: Her initial vital signs temperature 96.40F, pulse 92, blood pressure 114/80 mmHg, respirations 20 and O2 sat 100% on nasal cannula oxygen. She received supplemental oxygen and furosemide 40 mg IVP in the emergency department. EKG shows atrial fibrillation with borderline T-wave abnormalities and borderline QT prolongation. BMP was 1190 pg/mm. Her troponin level was normal. WBC 6.2, hemoglobin 11.5 g/dL and platelets 233. Her lactic acid was normal 2. Sodium 139, potassium 4.4, chloride 100 and treat him bicarbonate 29 mmol/L. BUN was 27, creatinine 1.0, calcium 8.4, magnesium 1.8 and glucose 79 mg/dL. Her LFTs show hypoalbuminemia of 2.9 g/dL, but were otherwise unremarkable. Chest radiograph showed cardiomegaly and CHF pattern. There was a question of loculation on right lower pleural effusion.   Review of Systems: As per HPI otherwise 10 point review of systems negative.    Past Medical History:    Diagnosis Date  . Arteriosclerotic cardiovascular disease (ASCVD)    a. Cath 12/2002 showed high grade LAD stenosis treated with DES, also had residual CAD treated medically at that time with 50% + 70-80% mid-septal perforator, 80% D1, 70% OM1, 50% ostial RCA, 25% mRCA.  . Bronchitis    history  . Carcinoma of breast ([place])    left masectomy in 1995  . Chronic diastolic heart failure ([place]) 11/21/2009  . Chronic hoarseness   . CKD (chronic kidney disease), stage II    per historical labs for age, weight, Cr  . CVD (cerebrovascular disease)    plaque w/o focal disease in 2006; h/o CVA  . Dementia   . Diabetes mellitus type II    no insulin   . Edema   . Herpes zoster   . Hypertension   . Hypothyroid   . Lower extremity edema 08/20/2011  . MVP (mitral valve prolapse)    moderate; with moderate MR  . Osteoporosis   . Peripheral neuropathy   . Permanent atrial fibrillation ([place])   . PVD (peripheral vascular disease) (H[facility])    ABIs of 0.64 and 0.59, right and left leg in 2009  . 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 Shingles   . Ulcer    left lower leg  . Varicose veins of legs 08/20/2011    Past Surgical History:  Procedure Laterality Date  . ABDOMINAL HYSTERECTOMY     abd?  . CATARACT EXTRACTION, BILATERAL    . CHOLECYSTECTOMY  2006  . COLONOSCOPY  Date unknown  . CORONARY ANGIOPLASTY WITH STENT PLACEMENT    . left masectomy  1995  . ORIF of left hip  05/22/06   [name]     reports that she has never smoked. She  has never used smokeless tobacco. She reports that she does not drink alcohol or use drugs.  No Known Allergies  Family History  Problem Relation Age of Onset  . Diabetes Mother     Prior to Admission medications   Medication Sig Start Date End Date Taking? Authorizing Provider  acetaminophen (TYLENOL) 325 MG tablet Take 650 mg by mouth 2 (two) times daily as needed for moderate pain or fever.    Yes [provider]  apixaban (ELIQUIS) 2.5 MG TABS tablet Take  1 tablet (2.5 mg total) by mouth 2 (two) times daily. 12/11/16  Yes Isaac Bliss, Rayford Halsted, MD  atorvastatin (LIPITOR) 20 MG tablet Take 20 mg by mouth daily.   Yes [provider]  bisoprolol (ZEBETA) 5 MG tablet Take 5 mg by mouth daily.   Yes [provider]  calcium carbonate (TUMS - DOSED IN MG ELEMENTAL CALCIUM) 500 MG chewable tablet Chew 1 tablet by mouth 3 (three) times daily before meals.   Yes [provider]  cholecalciferol (VITAMIN D) 1000 units tablet Take 1,000 Units by mouth daily.   Yes [provider]  diltiazem (CARDIZEM CD) 120 MG 24 hr capsule Take 1 capsule (120 mg total) by mouth daily. 09/30/16  Yes Orvan Falconer, MD  furosemide (LASIX) 40 MG tablet Take 60 mg in am ( 1 1/2 tabs) and 40 mg in afternoon 01/30/17  Yes Fay Records, MD  gabapentin (NEURONTIN) 100 MG capsule Take 200 mg by mouth every 8 (eight) hours.    Yes [provider]  isosorbide mononitrate (IMDUR) 30 MG 24 hr tablet Take 30 mg by mouth daily.   Yes [provider]  levalbuterol (XOPENEX) 0.63 MG/3ML nebulizer solution Inhale 3 mLs into the lungs 3 (three) times daily as needed for wheezing or cough. Via nebulizer 12/17/16  Yes [provider]  levothyroxine (SYNTHROID, LEVOTHROID) 25 MCG tablet Take 25 mcg by mouth every evening.    Yes [provider]  lisinopril (PRINIVIL,ZESTRIL) 20 MG tablet Take 1 tablet (20 mg total) by mouth daily. 03/21/16  Yes Barton Dubois, MD  metFORMIN (GLUCOPHAGE) 500 MG tablet Take 500 mg by mouth 2 (two) times daily with a meal.   Yes [provider]  OVER THE COUNTER MEDICATION Take 120 mLs by mouth 3 (three) times daily.   Yes [provider]  polyethylene glycol (MIRALAX / GLYCOLAX) packet Take 17 g by mouth daily.   Yes [provider]  potassium chloride SA (K-DUR,KLOR-CON) 20 MEQ tablet Take 2 tablets (40 mEq total) by mouth daily. 03/21/16  Yes Barton Dubois, MD     Physical Exam: Vitals:   02/17/17 1300 02/17/17 1330 02/17/17 1430 02/17/17 1500  BP: 110/76 104/64 96/71 (!) 103/91  Pulse:      Resp: 18 18 18  (!) 22  Temp:      TempSrc:      SpO2:        Constitutional: NAD, calm, comfortable Eyes: PERRL, lids and conjunctivae normal ENMT: Mucous membranes are mildly dry. Posterior pharynx clear of any exudate or lesions. Neck: normal, supple, no masses, no thyromegaly Respiratory: Decreased breath sounds on bases with rales bilaterally,  no crackles. Normal respiratory effort. No accessory muscle use.  Cardiovascular: Irregularly irregular, positive 3/6 SEM, no rubs / gallops. No extremity edema. 2+ pedal pulses. No carotid bruits.  Abdomen: Soft, no tenderness, no masses palpated. No hepatosplenomegaly. Bowel sounds positive.  Musculoskeletal: no clubbing / cyanosis. Positive MCP joints deformities and  no scoliosis. Good ROM, no contractures. Normal muscle tone.  Skin: Scattered ecchymosis on extremities. Neurologic: Somnolent, but seems grossly nonfocal. Moves all extremities.  Psychiatric: Somnolent. Wakes up briefly and is oriented to name and place, but not fully oriented to situation and is oriented to time and date.   Labs on Admission: I have personally reviewed following labs and imaging studies  CBC:  Recent Labs Lab 02/17/17 0950  WBC 6.2  NEUTROABS 4.0  HGB 11.5*  HCT 36.3  MCV 81.8  PLT 010   Basic Metabolic Panel:  Recent Labs Lab 02/17/17 0950  NA 139  K 4.4  CL 103  CO2 29  GLUCOSE 79  BUN 27*  CREATININE 1.00  CALCIUM 8.4*   GFR: CrCl cannot be calculated (Unknown ideal weight.). Liver Function Tests:  Recent Labs Lab 02/17/17 0950  AST 23  ALT 19  ALKPHOS 57  BILITOT 0.9  PROT 5.7*  ALBUMIN 2.9*   No results for input(s): LIPASE, AMYLASE in the last 168 hours. No results for input(s): AMMONIA in the last 168 hours. Coagulation Profile: No results for input(s): INR, PROTIME in the last  168 hours. Cardiac Enzymes:  Recent Labs Lab 02/17/17 0950  TROPONINI <0.03   BNP (last 3 results) No results for input(s): PROBNP in the last 8760 hours. HbA1C: No results for input(s): HGBA1C in the last 72 hours. CBG: No results for input(s): GLUCAP in the last 168 hours. Lipid Profile: No results for input(s): CHOL, HDL, LDLCALC, TRIG, CHOLHDL, LDLDIRECT in the last 72 hours. Thyroid Function Tests: No results for input(s): TSH, T4TOTAL, FREET4, T3FREE, THYROIDAB in the last 72 hours. Anemia Panel: No results for input(s): VITAMINB12, FOLATE, FERRITIN, TIBC, IRON, RETICCTPCT in the last 72 hours. Urine analysis:    Component Value Date/Time   COLORURINE YELLOW 02/17/2017 0950   APPEARANCEUR CLEAR 02/17/2017 0950   LABSPEC 1.009 02/17/2017 0950   PHURINE 5.0 02/17/2017 0950   GLUCOSEU NEGATIVE 02/17/2017 0950   HGBUR NEGATIVE 02/17/2017 0950   BILIRUBINUR NEGATIVE 02/17/2017 0950   KETONESUR NEGATIVE 02/17/2017 0950   PROTEINUR NEGATIVE 02/17/2017 0950   UROBILINOGEN 0.2 04/19/2014 1730   NITRITE NEGATIVE 02/17/2017 0950   LEUKOCYTESUR NEGATIVE 02/17/2017 0950    Radiological Exams on Admission: Ct Head Wo Contrast  Result Date: 02/17/2017 CLINICAL DATA:  83 y/o  F; increased confusion. EXAM: CT HEAD WITHOUT CONTRAST TECHNIQUE: Contiguous axial images were obtained from the base of the skull through the vertex without intravenous contrast. COMPARISON:  04/19/2014 CT head. FINDINGS: Brain: No evidence of acute infarction, hemorrhage, hydrocephalus, extra-axial collection or mass lesion/mass effect. Stable advanced brain parenchymal volume loss and mild chronic microvascular ischemic changes. Vascular: Calcific atherosclerosis of carotid siphons and vertebral arteries. Skull: Normal. Negative for fracture or focal lesion. Sinuses/Orbits: No acute finding. Other: None. IMPRESSION: 1. No acute intracranial abnormality identified. 2. Stable mild chronic microvascular ischemic  changes and advanced parenchymal volume loss of the brain. Electronically Signed   By: Kristine Garbe M.D.   On: 02/17/2017 14:16   Dg Chest Port 1 View  Result Date: 02/17/2017 CLINICAL DATA:  Hypotension.  Shortness of breath EXAM: PORTABLE CHEST 1 VIEW COMPARISON:  12/10/2016 FINDINGS: Marked cardiopericardial enlargement. There is increase in diffuse interstitial and airspace opacity with right-sided pleural effusion that could be loculated. No pneumothorax. IMPRESSION: CHF pattern. Electronically Signed   By: Monte Fantasia M.D.   On: 02/17/2017 10:21  Echocardiogram 03/18/2016  ------------------------------------------------------------------- LV EF: 60% -   65%  ------------------------------------------------------------------- Indications:  CHF - 428.0.  ------------------------------------------------------------------- History:   PMH:  Acute pulmonary edema.  Atrial fibrillation.  PMH:  Left mastectomy, Carcinoma of breast.  Risk factors: Hypertension. Diabetes mellitus. Dyslipidemia.  ------------------------------------------------------------------- Study Conclusions  - Left ventricle: The cavity size was normal. There was focal basal   hypertrophy. Systolic function was normal. The estimated ejection   fraction was in the range of 60% to 65%. Wall motion was normal;   there were no regional wall motion abnormalities. - Aortic valve: Moderately calcified annulus. Trileaflet; mildly   thickened leaflets. Valve area (VTI): 1.55 cm^2. Valve area   (Vmax): 1.4 cm^2. Valve area (Vmean): 1.39 cm^2. - Mitral valve: Moderately calcified annulus. Mildly thickened   leaflets . There was mild regurgitation. - Left atrium: The atrium was massively dilated. - Right atrium: The atrium was severely dilated. - Atrial septum: No defect or patent foramen ovale was identified. - Pulmonary arteries: Systolic pressure was moderately increased.   PA peak pressure: 47 mm  Hg (S). - Pericardium, extracardiac: There is a large left pleural   effusion. There is a small circumferential pericardial effusion. - Technically adequate study.   EKG: Independently reviewed. Vent. rate 103 BPM PR interval * ms QRS duration 96 ms QT/QTc 378/495 ms P-R-T axes * 116 -45 Atrial fibrillation Right axis deviation Borderline T abnormalities, diffuse leads Borderline prolonged QT interval  Assessment/Plan Principal Problem:   Acute on chronic diastolic congestive heart failure (HCC) Admit to telemetry/observation. Continue supplemental oxygen. Continue Imdur 30 mg by mouth daily. Xopenex 0.63 mg via neb 3 times a day as needed. Furosemide 20 mg IVP every 6 hours. Continue lisinopril 20 mg by mouth daily Monitor intake and output. Daily weights. Monitor BUN, creatinine and electrolytes. Recheck chest radiograph in a.m. to check for possible right-sided effusion loculation.  Active Problems:   Type 2 diabetes mellitus (HCC) Carbohydrate modified diet. Continue metformin 500 mg by mouth twice a day. CBG monitoring before each meal.    Hyperlipidemia Continue atorvastatin 20 mg by mouth daily. LFTs unremarkable other than hypoalbuminemia. Monitor LFTs as needed. Fasting lipid profile monitoring as an outpatient.    Hypertension Continue bisoprolol 5 mg by mouth daily. Continue diltiazem120 mg by mouth daily. Continue lisinopril 20 mg by mouth daily. Monitor blood pressure, renal function and electrolytes.    Thyroid activity decreased Continue levothyroxine 25 g by mouth daily. TSH level monitoring as needed.    Lewy body dementia without behavioral disturbance Supportive care.    Permanent atrial fibrillation (HCC) CHA2DS2-VASc Score of at least 7. Continue Eliquis 2.5 mg by mouth twice daily Continue beta and calcium channel blockers for rate control.    CAD in native artery Continue Apixaban, beta blocker and statin,     DVT prophylaxis:  On Apixaban. Code Status: Partial code. DNI or cardiovert. Family Communication: Her son was present in the ED room. Disposition Plan: Admit for IV diuretics and CHF therapy. Consults called:  Admission status: Observation/Telemetry.   Reubin Milan MD [facility] Pager [phone].  If 7PM-7AM, please contact night-coverage [email or web address] Password [email or web address]  02/17/2017, 3:36 PM

## 2017-02-17 NOTE — ED Notes (Signed)
Patient transported to CT 

## 2017-02-17 NOTE — ED Provider Notes (Signed)
[facility] DEPT Provider Note   CSN: [id number] Arrival date & time: 02/17/17  7829     History   Chief Complaint Chief Complaint  Patient presents with  . Hypotension    low sats    HPI JAMEYA PONTIFF is a 83 y.o. female.  The history is provided by the EMS personnel and the nursing home. The history is limited by the condition of the patient (Hx dementia).  Pt was seen at [facility]. Per EMS and NH report: Pt with increasing confusion and weakness since yesterday. NH states today, pt's "O2 Sat was in the 80's on R/A" and her "SBP was 80's." EMS states pt was placed on O2 2L N/C with O2 Sats increasing to 100%, and pt's SBP was "100-110's." Pt herself has hx of dementia.    Past Medical History:  Diagnosis Date  . Arteriosclerotic cardiovascular disease (ASCVD)    a. Cath 12/2002 showed high grade LAD stenosis treated with DES, also had residual CAD treated medically at that time with 50% + 70-80% mid-septal perforator, 80% D1, 70% OM1, 50% ostial RCA, 25% mRCA.  . Bronchitis    history  . Carcinoma of breast ([place])    left masectomy in 1995  . Chronic diastolic heart failure ([place]) 11/21/2009  . Chronic hoarseness   . CKD (chronic kidney disease), stage II    per historical labs for age, weight, Cr  . CVD (cerebrovascular disease)    plaque w/o focal disease in 2006; h/o CVA  . Dementia   . Diabetes mellitus type II    no insulin   . Edema   . Herpes zoster   . Hypertension   . Hypothyroid   . Lower extremity edema 08/20/2011  . MVP (mitral valve prolapse)    moderate; with moderate MR  . Osteoporosis   . Peripheral neuropathy   . Permanent atrial fibrillation ([place])   . PVD (peripheral vascular disease) (HCC)    ABIs of 0.64 and 0.59, right and left leg in 2009  . 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 Shingles   . Ulcer    left lower leg  . Varicose veins of legs 08/20/2011    Patient Active Problem List   Diagnosis Date Noted  . Permanent atrial fibrillation ([place]) 12/27/2016    . CAD in native artery 12/27/2016  . SIRS (systemic inflammatory response syndrome) ([place]) 12/10/2016  . Elevated troponin 12/10/2016  . SOB (shortness of breath)   . Lewy body dementia without behavioral disturbance   . Hyponatremia 03/18/2016  . Abdominal distention 04/13/2014  . Hip pain 04/12/2014  . Labral tear of hip, degenerative 04/12/2014  . Trochanteric bursitis of right hip 04/12/2014  . Encounter for therapeutic drug monitoring 10/06/2013  . Lower extremity edema 08/20/2011  . Arteriosclerotic cardiovascular disease (ASCVD)   . Carcinoma of breast ([place])   . Atrial fibrillation with RVR ([place])   . MVP (mitral valve prolapse)   . PVD (peripheral vascular disease) ([place])   . CVD (cerebrovascular disease)   . Thyroid activity decreased   . Osteoporosis   . Chronic anticoagulation 11/22/2010  . Type 2 diabetes mellitus ([place]) 11/21/2009  . Hyperlipidemia 11/21/2009  . Hypertension 11/21/2009  . Acute on chronic diastolic heart failure ([place]) 11/21/2009  . SPINAL STENOSIS, LUMBAR 09/05/2008    Past Surgical History:  Procedure Laterality Date  . ABDOMINAL HYSTERECTOMY     abd?  . CATARACT EXTRACTION, BILATERAL    . CHOLECYSTECTOMY  2006  . COLONOSCOPY  Date  unknown  . CORONARY ANGIOPLASTY WITH STENT PLACEMENT    . left masectomy  1995  . ORIF of left hip  05/22/06   Darylene Price History    No data available       Home Medications    Prior to Admission medications   Medication Sig Start Date End Date Taking? Authorizing Provider  acetaminophen (TYLENOL) 325 MG tablet Take 650 mg by mouth 2 (two) times daily.    [provider]  apixaban (ELIQUIS) 2.5 MG TABS tablet Take 1 tablet (2.5 mg total) by mouth 2 (two) times daily. 12/11/16   Isaac Bliss, Rayford Halsted, MD  atorvastatin (LIPITOR) 20 MG tablet Take 20 mg by mouth daily.    [provider]  calcium carbonate (TUMS - DOSED IN MG ELEMENTAL CALCIUM) 500 MG chewable tablet Chew 1 tablet by  mouth 3 (three) times daily before meals.    [provider]  cholecalciferol (VITAMIN D) 1000 units tablet Take 1,000 Units by mouth daily.    [provider]  diltiazem (CARDIZEM CD) 120 MG 24 hr capsule Take 1 capsule (120 mg total) by mouth daily. 09/30/16   Orvan Falconer, MD  furosemide (LASIX) 40 MG tablet Take 60 mg in am ( 1 1/2 tabs) and 40 mg in afternoon 01/30/17   Fay Records, MD  gabapentin (NEURONTIN) 100 MG capsule Take 200 mg by mouth every 8 (eight) hours.     [provider]  isosorbide mononitrate (IMDUR) 30 MG 24 hr tablet Take 30 mg by mouth daily.    [provider]  levothyroxine (SYNTHROID, LEVOTHROID) 25 MCG tablet Take 25 mcg by mouth every evening.     [provider]  lisinopril (PRINIVIL,ZESTRIL) 20 MG tablet Take 1 tablet (20 mg total) by mouth daily. 03/21/16   Barton Dubois, MD  metFORMIN (GLUCOPHAGE) 500 MG tablet Take 500 mg by mouth 2 (two) times daily with a meal.    [provider]  potassium chloride SA (K-DUR,KLOR-CON) 20 MEQ tablet Take 2 tablets (40 mEq total) by mouth daily. 03/21/16   Barton Dubois, MD    Family History Family History  Problem Relation Age of Onset  . Diabetes Mother     Social History Social History  Substance Use Topics  . Smoking status: Never Smoker  . Smokeless tobacco: Never Used     Comment: tobacco use - no   . Alcohol use No     Allergies   Patient has no known allergies.   Review of Systems Review of Systems  Unable to perform ROS: Dementia     Physical Exam Updated Vital Signs BP 114/80 (BP Location: Right Arm)   Pulse 92   Resp 20   SpO2 100%    Patient Vitals for the past 24 hrs:  BP Temp Temp src Pulse Resp SpO2  02/17/17 1500 (!) 103/91 - - - (!) 22 -  02/17/17 1430 96/71 - - - 18 -  02/17/17 1330 104/64 - - - 18 -  02/17/17 1300 110/76 - - - 18 -  02/17/17 1230 105/79 - - 76 16 98 %  02/17/17 1200 104/67 - - (!) 49 17 98 %  02/17/17 1130  102/72 - - 89 17 99 %  02/17/17 1115 - - - 88 18 100 %  02/17/17 1100 107/70 - - 89 17 96 %  02/17/17 1045 - - - 73 16 96 %  02/17/17 1030 106/78 - - 85 16 97 %  02/17/17 1017 - - - - - 93 %  02/17/17 1016 - (!) 96.3 F (35.7 C) Rectal - - -  02/17/17 1000 98/70 - - - 17 -  02/17/17 0940 114/80 - Oral 92 20 100 %      Physical Exam 0945: Physical examination:  Nursing notes reviewed; Vital signs and O2 SAT reviewed;  Constitutional: Thin, frail. In no acute distress; Head:  Normocephalic, atraumatic; Eyes: EOMI, PERRL, No scleral icterus; ENMT: Mouth and pharynx normal, Mucous membranes dry; Neck: Supple, Full range of motion, No lymphadenopathy; Cardiovascular: Irregular rate and rhythm, No gallop; Respiratory: Breath sounds diminished & equal bilaterally, No wheezes. Normal respiratory effort/excursion; Chest: Nontender, Movement normal; Abdomen: Soft, Nontender, Nondistended, Normal bowel sounds; Genitourinary: No CVA tenderness; Extremities: Pulses normal, No tenderness, +tr pedal edema bilat with chronic stasis changes. No calf edema or asymmetry.; Neuro: Awake, alert, confused re: time, events.Speech minimal but clear. Moves all extremities on stretcher spontaneously.; Skin: Color normal, Warm, Dry.   ED Treatments / Results  Labs (all labs ordered are listed, but only abnormal results are displayed)   EKG  EKG Interpretation  Date/Time:  Monday February 17 2017 09:45:45 EDT Ventricular Rate:  103 PR Interval:    QRS Duration: 96 QT Interval:  378 QTC Calculation: 495 R Axis:   116 Text Interpretation:  Atrial fibrillation Right axis deviation Borderline T abnormalities, diffuse leads Borderline prolonged QT interval When compared with ECG of 12/10/2016 Nonspecific ST and T wave abnormality is now Present QT has lengthened Confirmed by [facility]  MD, Nunzio Cory [phone]) on 02/17/2017 9:58:22 AM       Radiology   Procedures Procedures (including critical care time)  Medications  Ordered in ED Medications - No data to display   Initial Impression / Assessment and Plan / ED Course  I have reviewed the triage vital signs and the nursing notes.  Pertinent labs & imaging results that were available during my care of the patient were reviewed by me and considered in my medical decision making (see chart for details).  MDM Reviewed: previous chart, nursing note and vitals Reviewed previous: labs and ECG Interpretation: labs, ECG and x-ray   Results for orders placed or performed during the hospital encounter of 02/17/17  Urinalysis, Routine w reflex microscopic  Result Value Ref Range   Color, Urine YELLOW YELLOW   APPearance CLEAR CLEAR   Specific Gravity, Urine 1.009 1.005 - 1.030   pH 5.0 5.0 - 8.0   Glucose, UA NEGATIVE NEGATIVE mg/dL   Hgb urine dipstick NEGATIVE NEGATIVE   Bilirubin Urine NEGATIVE NEGATIVE   Ketones, ur NEGATIVE NEGATIVE mg/dL   Protein, ur NEGATIVE NEGATIVE mg/dL   Nitrite NEGATIVE NEGATIVE   Leukocytes, UA NEGATIVE NEGATIVE  Comprehensive metabolic panel  Result Value Ref Range   Sodium 139 135 - 145 mmol/L   Potassium 4.4 3.5 - 5.1 mmol/L   Chloride 103 101 - 111 mmol/L   CO2 29 22 - 32 mmol/L   Glucose, Bld 79 65 - 99 mg/dL   BUN 27 (H) 6 - 20 mg/dL   Creatinine, Ser 1.00 0.44 - 1.00 mg/dL   Calcium 8.4 (L) 8.9 - 10.3 mg/dL   Total Protein 5.7 (L) 6.5 - 8.1 g/dL   Albumin 2.9 (L) 3.5 - 5.0 g/dL   AST 23 15 - 41 U/L   ALT 19 14 - 54 U/L   Alkaline Phosphatase 57 38 - 126 U/L   Total Bilirubin 0.9 0.3 - 1.2 mg/dL  GFR calc non Af Amer 47 (L) >60 mL/min   GFR calc Af Amer 54 (L) >60 mL/min   Anion gap 7 5 - 15  Troponin I  Result Value Ref Range   Troponin I <0.03 <0.03 ng/mL  Lactic acid, plasma  Result Value Ref Range   Lactic Acid, Venous 1.4 0.5 - 1.9 mmol/L  Lactic acid, plasma  Result Value Ref Range   Lactic Acid, Venous 1.8 0.5 - 1.9 mmol/L  CBC with Differential  Result Value Ref Range   WBC 6.2 4.0 - 10.5  K/uL   RBC 4.44 3.87 - 5.11 MIL/uL   Hemoglobin 11.5 (L) 12.0 - 15.0 g/dL   HCT 36.3 36.0 - 46.0 %   MCV 81.8 78.0 - 100.0 fL   MCH 25.9 (L) 26.0 - 34.0 pg   MCHC 31.7 30.0 - 36.0 g/dL   RDW 18.4 (H) 11.5 - 15.5 %   Platelets 233 150 - 400 K/uL   Neutrophils Relative % 64 %   Neutro Abs 4.0 1.7 - 7.7 K/uL   Lymphocytes Relative 19 %   Lymphs Abs 1.2 0.7 - 4.0 K/uL   Monocytes Relative 13 %   Monocytes Absolute 0.8 0.1 - 1.0 K/uL   Eosinophils Relative 3 %   Eosinophils Absolute 0.2 0.0 - 0.7 K/uL   Basophils Relative 1 %   Basophils Absolute 0.0 0.0 - 0.1 K/uL  Brain natriuretic peptide  Result Value Ref Range   B Natriuretic Peptide 1,190.0 (H) 0.0 - 100.0 pg/mL   Dg Chest Port 1 View Result Date: 02/17/2017 CLINICAL DATA:  Hypotension.  Shortness of breath EXAM: PORTABLE CHEST 1 VIEW COMPARISON:  12/10/2016 FINDINGS: Marked cardiopericardial enlargement. There is increase in diffuse interstitial and airspace opacity with right-sided pleural effusion that could be loculated. No pneumothorax. IMPRESSION: CHF pattern. Electronically Signed   By: Monte Fantasia M.D.   On: 02/17/2017 10:21   Ct Head Wo Contrast Result Date: 02/17/2017 CLINICAL DATA:  83 y/o  F; increased confusion. EXAM: CT HEAD WITHOUT CONTRAST TECHNIQUE: Contiguous axial images were obtained from the base of the skull through the vertex without intravenous contrast. COMPARISON:  04/19/2014 CT head. FINDINGS: Brain: No evidence of acute infarction, hemorrhage, hydrocephalus, extra-axial collection or mass lesion/mass effect. Stable advanced brain parenchymal volume loss and mild chronic microvascular ischemic changes. Vascular: Calcific atherosclerosis of carotid siphons and vertebral arteries. Skull: Normal. Negative for fracture or focal lesion. Sinuses/Orbits: No acute finding. Other: None. IMPRESSION: 1. No acute intracranial abnormality identified. 2. Stable mild chronic microvascular ischemic changes and advanced  parenchymal volume loss of the brain. Electronically Signed   By: Kristine Garbe M.D.   On: 02/17/2017 14:16    1525:  BNP elevated with CHF on CXR; IV lasix given. CT head reassuring. BP soft, afebrile.  T/C to [facility]. Olevia Bowens, case discussed, including:  HPI, pertinent PM/SHx, VS/PE, dx testing, ED course and treatment:  Agreeable to admit.     Final Clinical Impressions(s) / ED Diagnoses   Final diagnoses:  None    New Prescriptions New Prescriptions   No medications on file      Francine Graven, DO 02/21/17 1544

## 2017-02-18 ENCOUNTER — Observation Stay (HOSPITAL_COMMUNITY): Payer: Medicare Other

## 2017-02-18 DIAGNOSIS — G3183 Dementia with Lewy bodies: Secondary | ICD-10-CM | POA: Diagnosis present

## 2017-02-18 DIAGNOSIS — I5033 Acute on chronic diastolic (congestive) heart failure: Secondary | ICD-10-CM | POA: Diagnosis not present

## 2017-02-18 DIAGNOSIS — I482 Chronic atrial fibrillation: Secondary | ICD-10-CM | POA: Diagnosis present

## 2017-02-18 DIAGNOSIS — J9600 Acute respiratory failure, unspecified whether with hypoxia or hypercapnia: Secondary | ICD-10-CM | POA: Diagnosis not present

## 2017-02-18 DIAGNOSIS — Z853 Personal history of malignant neoplasm of breast: Secondary | ICD-10-CM | POA: Diagnosis not present

## 2017-02-18 DIAGNOSIS — L89151 Pressure ulcer of sacral region, stage 1: Secondary | ICD-10-CM | POA: Diagnosis present

## 2017-02-18 DIAGNOSIS — I341 Nonrheumatic mitral (valve) prolapse: Secondary | ICD-10-CM | POA: Diagnosis present

## 2017-02-18 DIAGNOSIS — Z79899 Other long term (current) drug therapy: Secondary | ICD-10-CM | POA: Diagnosis not present

## 2017-02-18 DIAGNOSIS — M810 Age-related osteoporosis without current pathological fracture: Secondary | ICD-10-CM | POA: Diagnosis present

## 2017-02-18 DIAGNOSIS — I130 Hypertensive heart and chronic kidney disease with heart failure and stage 1 through stage 4 chronic kidney disease, or unspecified chronic kidney disease: Secondary | ICD-10-CM | POA: Diagnosis present

## 2017-02-18 DIAGNOSIS — G9340 Encephalopathy, unspecified: Secondary | ICD-10-CM | POA: Diagnosis present

## 2017-02-18 DIAGNOSIS — Z955 Presence of coronary angioplasty implant and graft: Secondary | ICD-10-CM | POA: Diagnosis not present

## 2017-02-18 DIAGNOSIS — Z8673 Personal history of transient ischemic attack (TIA), and cerebral infarction without residual deficits: Secondary | ICD-10-CM | POA: Diagnosis not present

## 2017-02-18 DIAGNOSIS — I4891 Unspecified atrial fibrillation: Secondary | ICD-10-CM

## 2017-02-18 DIAGNOSIS — E119 Type 2 diabetes mellitus without complications: Secondary | ICD-10-CM | POA: Diagnosis not present

## 2017-02-18 DIAGNOSIS — Z7901 Long term (current) use of anticoagulants: Secondary | ICD-10-CM | POA: Diagnosis not present

## 2017-02-18 DIAGNOSIS — I5031 Acute diastolic (congestive) heart failure: Secondary | ICD-10-CM | POA: Diagnosis present

## 2017-02-18 DIAGNOSIS — Z833 Family history of diabetes mellitus: Secondary | ICD-10-CM | POA: Diagnosis not present

## 2017-02-18 DIAGNOSIS — L8990 Pressure ulcer of unspecified site, unspecified stage: Secondary | ICD-10-CM | POA: Insufficient documentation

## 2017-02-18 DIAGNOSIS — Z9012 Acquired absence of left breast and nipple: Secondary | ICD-10-CM | POA: Diagnosis not present

## 2017-02-18 DIAGNOSIS — E785 Hyperlipidemia, unspecified: Secondary | ICD-10-CM | POA: Diagnosis present

## 2017-02-18 DIAGNOSIS — I509 Heart failure, unspecified: Secondary | ICD-10-CM | POA: Diagnosis not present

## 2017-02-18 DIAGNOSIS — E1122 Type 2 diabetes mellitus with diabetic chronic kidney disease: Secondary | ICD-10-CM | POA: Diagnosis present

## 2017-02-18 DIAGNOSIS — Z7984 Long term (current) use of oral hypoglycemic drugs: Secondary | ICD-10-CM | POA: Diagnosis not present

## 2017-02-18 DIAGNOSIS — R0902 Hypoxemia: Secondary | ICD-10-CM | POA: Diagnosis present

## 2017-02-18 DIAGNOSIS — N182 Chronic kidney disease, stage 2 (mild): Secondary | ICD-10-CM | POA: Diagnosis present

## 2017-02-18 DIAGNOSIS — I2510 Atherosclerotic heart disease of native coronary artery without angina pectoris: Secondary | ICD-10-CM | POA: Diagnosis present

## 2017-02-18 DIAGNOSIS — F0280 Dementia in other diseases classified elsewhere without behavioral disturbance: Secondary | ICD-10-CM | POA: Diagnosis present

## 2017-02-18 DIAGNOSIS — J9601 Acute respiratory failure with hypoxia: Secondary | ICD-10-CM

## 2017-02-18 DIAGNOSIS — E039 Hypothyroidism, unspecified: Secondary | ICD-10-CM | POA: Diagnosis present

## 2017-02-18 LAB — BASIC METABOLIC PANEL
Anion gap: 9 (ref 5–15)
BUN: 27 mg/dL — ABNORMAL HIGH (ref 6–20)
CALCIUM: 8.7 mg/dL — AB (ref 8.9–10.3)
CO2: 29 mmol/L (ref 22–32)
CREATININE: 0.86 mg/dL (ref 0.44–1.00)
Chloride: 105 mmol/L (ref 101–111)
GFR calc Af Amer: >60 mL/min (ref >60)
GFR calc non Af Amer: 56 mL/min — ABNORMAL LOW (ref >60)
Glucose, Bld: 70 mg/dL (ref 65–99)
Potassium: 4 mmol/L (ref 3.5–5.1)
Sodium: 143 mmol/L (ref 135–145)

## 2017-02-18 LAB — CBC
HCT: 42 % (ref 36.0–46.0)
Hemoglobin: 12.9 g/dL (ref 12.0–15.0)
MCH: 25.4 pg — AB (ref 26.0–34.0)
MCHC: 30.7 g/dL (ref 30.0–36.0)
MCV: 82.7 fL (ref 78.0–100.0)
Platelets: 249 10*3/uL (ref 150–400)
RBC: 5.08 MIL/uL (ref 3.87–5.11)
RDW: 18.6 % — ABNORMAL HIGH (ref 11.5–15.5)
WBC: 8 10*3/uL (ref 4.0–10.5)

## 2017-02-18 LAB — GLUCOSE, CAPILLARY
Glucose-Capillary: 71 mg/dL (ref 65–99)
Glucose-Capillary: 126 mg/dL — ABNORMAL HIGH (ref 65–99)
Glucose-Capillary: 134 mg/dL — ABNORMAL HIGH (ref 65–99)
Glucose-Capillary: 149 mg/dL — ABNORMAL HIGH (ref 65–99)

## 2017-02-18 LAB — MRSA PCR SCREENING: MRSA by PCR: NEGATIVE

## 2017-02-18 MED ORDER — FUROSEMIDE 10 MG/ML IJ SOLN: 20.0000 mg | Freq: Every day | INTRAMUSCULAR | Status: DC

## 2017-02-18 MED ORDER — ORAL CARE MOUTH RINSE
15.0000 mL | Freq: Two times a day (BID) | OROMUCOSAL | Status: DC
  Administered 2017-02-18 – 2017-02-19 (×2): 15 mL via OROMUCOSAL

## 2017-02-18 MED ORDER — FUROSEMIDE 10 MG/ML IJ SOLN
20.0000 mg | Freq: Two times a day (BID) | INTRAMUSCULAR | Status: DC
  Administered 2017-02-18 – 2017-02-19 (×2): 20 mg via INTRAVENOUS
  Filled 2017-02-18 (×2): qty 2

## 2017-02-18 MED ORDER — ACETAMINOPHEN 325 MG PO TABS: 650.0000 mg | ORAL_TABLET | Freq: Four times a day (QID) | ORAL | Status: DC | PRN

## 2017-02-18 MED ORDER — FUROSEMIDE 10 MG/ML IJ SOLN: 20.0000 mg | Freq: Two times a day (BID) | INTRAMUSCULAR | Status: DC

## 2017-02-18 NOTE — Care Management Note (Signed)
Case Management Note  Patient Details  Name: [name] MRN: [id number] Date of Birth: 1921-02-15  Subjective/Objective:    Adm with CHF exacerbation. From [facility]. Patient has dementia. Plans to return to [facility] at time of DC.                Action/Plan: CSW aware and will making arrangements for return.  Expected Discharge Date:       02/19/2017           Expected Discharge Plan:  Skilled Nursing Facility  In-House Referral:  Clinical Social Work  Discharge planning Services  CM Consult  Post Acute Care Choice:    Choice offered to:     DME Arranged:    DME Agency:     HH Arranged:    [place] Agency:     Status of Service:  In process, will continue to follow  If discussed at Long Length of Stay Meetings, dates discussed:    Additional Comments:  Roselin Wiemann, Chauncey Reading, RN 02/18/2017, 2:47 PM

## 2017-02-18 NOTE — Care Management Obs Status (Signed)
[facility] NOTIFICATION   Patient Details  Name: [name] MRN: [id number] Date of Birth: 18-Jan-1921   Medicare Observation Status Notification Given:  Yes (CM tried to call son, mutliple times, no answer, left form with patient in room (pt. has dementia))    Mir Fullilove, Chauncey Reading, RN 02/18/2017, 2:46 PM

## 2017-02-18 NOTE — Progress Notes (Signed)
PROGRESS NOTE  [name] CBS:[id number] DOB: Apr 13, 1921 DOA: 02/17/2017 PCP: Hilbert Corrigan, MD  Brief Narrative: 83 year old woman PMH chronic diastolic congestive heart failure, CAD, atrial fibrillation, dementia, diabetes, chronic kidney disease presented with mental status changes, confusion, hypoxia and hypotension. Admitted for acute on chronic diastolic congestive heart failure  Assessment/Plan Acute on chronic diastolic congestive heart failure -975 since admission. Improving. -Continue IV Lasix, still some lower extremity edema  Acute hypoxic respiratory failure -Wean oxygen as tolerated  Atrial fibrillation with rapid ventricular response, permanent atrial fibrillation on apixaban -Continue anticoagulation. Follow heart rate, may need to restart IV infusion. -Continue bisoprolol, Cardizem  Right-sided effusion with loculation? -Repeat chest x-ray 7/11  Acute encephalopathy. CT head no acute disease. -Baseline unclear but she does have dementia. Follow clinically.  PMH: CAD (s/p DES to LAD in 2004), dementia  Appears to be improving, plan to wean oxygen, control heart rate, likely home in the next 24 hours.  DVT prophylaxis: apixaban Code Status: partial Family Communication: none Disposition Plan: return to Erenest Blank, MD  [facility] Direct contact: [phone] --Via amion app OR  --[email or web address]; password [email or web address]  7PM-7AM contact night coverage as above 02/18/2017, 7:44 AM  LOS: 0 days   Consultants:    Procedures:    Antimicrobials:    Interval history/Subjective: Chronic pain in legs. Otherwise feels okay.  Objective: Vitals: 97.5, 21, 122, 108/75, 90% on nasal cannula  Exam:     Constitutional: Appears calm, comfortable  Eyes: Pupils, irises, lids appear unremarkable  ENT: Grossly normal lips, tongue  Cardiovascular: Irregular, tachycardic, no murmur, rub or gallop. Trace bilateral lower extremity  edema.  Respiratory: Clear to auscultation bilaterally. No wheezes, rales or rhonchi. Mild increased respiratory effort.  Abdomen: Soft, nontender, nondistended  Skin: No rash or induration.  Psychiatric: Speech fluent and clear. Oriented to self.   I have personally reviewed the following:   Labs:  Basic metabolic panel unremarkable  CBC unremarkable  Imaging studies:  Chest x-ray: CHF.  Medical tests:  Atrial fibrillation, nonspecific ST changes.   Test discussed with performing physician:    Decision to obtain old records:    Review and summation of old records:  Echo 03/2016: LVEF 60-65%. Normal wall motion. Moderate pulmonary hypertension.  Hospitalized May 2018 for atrial fibrillation with rapid ventricular response, less than 48 hours.  Scheduled Meds: . apixaban  2.5 mg Oral BID  . atorvastatin  20 mg Oral q1800  . bisoprolol  5 mg Oral Daily  . calcium carbonate  1 tablet Oral TID AC  . cholecalciferol  1,000 Units Oral Daily  . diltiazem  120 mg Oral Daily  . furosemide  20 mg Intravenous Q6H  . gabapentin  200 mg Oral Q8H  . isosorbide mononitrate  30 mg Oral Daily  . levothyroxine  25 mcg Oral QPM  . lisinopril  20 mg Oral Daily  . mouth rinse  15 mL Mouth Rinse BID  . metFORMIN  500 mg Oral BID WC  . polyethylene glycol  17 g Oral Daily  . potassium chloride SA  40 mEq Oral Daily   Continuous Infusions:  Principal Problem:   Acute on chronic diastolic congestive heart failure (HCC) Active Problems:   Type 2 diabetes mellitus (HCC)   Hyperlipidemia   Hypertension   Thyroid activity decreased   Lewy body dementia without behavioral disturbance   Permanent atrial fibrillation (HCC)   CAD in native artery   Pressure injury of skin  LOS: 0 days

## 2017-02-19 ENCOUNTER — Inpatient Hospital Stay (HOSPITAL_COMMUNITY): Payer: Medicare Other

## 2017-02-19 DIAGNOSIS — I5033 Acute on chronic diastolic (congestive) heart failure: Secondary | ICD-10-CM

## 2017-02-19 LAB — BASIC METABOLIC PANEL
ANION GAP: 6 (ref 5–15)
BUN: 27 mg/dL — ABNORMAL HIGH (ref 6–20)
CALCIUM: 8.1 mg/dL — AB (ref 8.9–10.3)
CHLORIDE: 100 mmol/L — AB (ref 101–111)
CO2: 30 mmol/L (ref 22–32)
CREATININE: 0.87 mg/dL (ref 0.44–1.00)
GFR calc non Af Amer: 55 mL/min — ABNORMAL LOW (ref >60)
Glucose, Bld: 91 mg/dL (ref 65–99)
Potassium: 4.4 mmol/L (ref 3.5–5.1)
SODIUM: 136 mmol/L (ref 135–145)

## 2017-02-19 LAB — URINE CULTURE: CULTURE: NO GROWTH

## 2017-02-19 LAB — GLUCOSE, CAPILLARY
GLUCOSE-CAPILLARY: 100 mg/dL — AB (ref 65–99)
Glucose-Capillary: 127 mg/dL — ABNORMAL HIGH (ref 65–99)

## 2017-02-19 NOTE — Progress Notes (Signed)
Foley catheter d/c.

## 2017-02-19 NOTE — Discharge Summary (Signed)
Physician Discharge Summary  [name] KAJ:[id number] DOB: [birth year] DOA: 02/17/2017  PCP: Hilbert Corrigan, MD  Admit date: 02/17/2017 Discharge date: 02/19/2017  Time spent: 45 minutes  Recommendations for Outpatient Follow-up:  -Will be discharged back to SNF today.   Discharge Diagnoses:  Principal Problem:   Acute on chronic diastolic congestive heart failure (HCC) Active Problems:   Type 2 diabetes mellitus (HCC)   Hyperlipidemia   Hypertension   Thyroid activity decreased   Lewy body dementia without behavioral disturbance   Permanent atrial fibrillation (HCC)   CAD in native artery   Pressure injury of skin   Acute diastolic CHF (congestive heart failure) (HCC)   Acute encephalopathy   Atrial fibrillation, rapid [facility])   Discharge Condition: Stable and improved  Filed Weights   02/17/17 2007 02/18/17 0500 02/19/17 1429  Weight: 54 kg (119 lb 0.8 oz) 54 kg (119 lb 0.8 oz) 57 kg (125 lb 10.6 oz)    History of present illness:  As per Dr. Olevia Bowens on 7/9: [name] is a 83 y.o. female with medical history significant of CAD, hypertension, chronic diastolic heart failure, mitral valve prolapse, chronic atrial fibrillation, cerebrovascular disease, dementia, history of bronchitis, chronic kidney disease, type 2 diabetes, history of herpes zoster, hypothyroidism, osteoporosis, peripheral neuropathy, peripheral vascular disease, osteoarthritis, varicose veins who was brought from the nursing home due to progressively worse mental status change with confusion since yesterday, hypoxia and hypotension earlier today. There has not been fever, no productive cough, urinary symptoms, nausea, vomiting or diarrhea. She is unable to provide further history at this time.   ED Course: Her initial vital signs temperature 96.61F, pulse 92, blood pressure 114/80 mmHg, respirations 20 and O2 sat 100% on nasal cannula oxygen. She received supplemental oxygen and furosemide 40 mg  IVP in the emergency department. EKG shows atrial fibrillation with borderline T-wave abnormalities and borderline QT prolongation. BMP was 1190 pg/mm. Her troponin level was normal. WBC 6.2, hemoglobin 11.5 g/dL and platelets 233. Her lactic acid was normal 2. Sodium 139, potassium 4.4, chloride 100 and treat him bicarbonate 29 mmol/L. BUN was 27, creatinine 1.0, calcium 8.4, magnesium 1.8 and glucose 79 mg/dL. Her LFTs show hypoalbuminemia of 2.9 g/dL, but were otherwise unremarkable. Chest radiograph showed cardiomegaly and CHF pattern. There was a question of loculation on right lower pleural effusion.              Hospital Course:   Acute on Chronic Diastolic CHF -ECHO 0/35: EF 60-65%. -Has diuresed 1.9 L since admission and is clinically euvolemic. -CXR shows improved pulmonary edema.  Acute Hypoxemic Respiratory Failure -Continue to wean oxygen as tolerated. -Secondary to acute CHF. -Continue lasix orally.  A Fib with RVR -Now rate controlled on bisoprolol and cardizem. -Continue apixaban for anticoagulation.   Procedures:  None   Consultations:  None  Discharge Instructions  Discharge Instructions    Diet - low sodium heart healthy    Complete by:  As directed    Increase activity slowly    Complete by:  As directed      Allergies as of 02/19/2017   No Known Allergies     Medication List    STOP taking these medications   OVER THE COUNTER MEDICATION     TAKE these medications   acetaminophen 325 MG tablet Commonly known as:  TYLENOL Take 650 mg by mouth 2 (two) times daily as needed for moderate pain or fever.   apixaban 2.5 MG  Tabs tablet Commonly known as:  ELIQUIS Take 1 tablet (2.5 mg total) by mouth 2 (two) times daily.   atorvastatin 20 MG tablet Commonly known as:  LIPITOR Take 20 mg by mouth daily.   bisoprolol 5 MG tablet Commonly known as:  ZEBETA Take 5 mg by mouth daily.   calcium carbonate 500 MG chewable tablet Commonly known as:   TUMS - dosed in mg elemental calcium Chew 1 tablet by mouth 3 (three) times daily before meals.   cholecalciferol 1000 units tablet Commonly known as:  VITAMIN D Take 1,000 Units by mouth daily.   diltiazem 120 MG 24 hr capsule Commonly known as:  CARDIZEM CD Take 1 capsule (120 mg total) by mouth daily.   furosemide 40 MG tablet Commonly known as:  LASIX Take 60 mg in am ( 1 1/2 tabs) and 40 mg in afternoon   gabapentin 100 MG capsule Commonly known as:  NEURONTIN Take 200 mg by mouth every 8 (eight) hours.   isosorbide mononitrate 30 MG 24 hr tablet Commonly known as:  IMDUR Take 30 mg by mouth daily.   levalbuterol 0.63 MG/3ML nebulizer solution Commonly known as:  XOPENEX Inhale 3 mLs into the lungs 3 (three) times daily as needed for wheezing or cough. Via nebulizer   levothyroxine 25 MCG tablet Commonly known as:  SYNTHROID, LEVOTHROID Take 25 mcg by mouth every evening.   lisinopril 20 MG tablet Commonly known as:  PRINIVIL,ZESTRIL Take 1 tablet (20 mg total) by mouth daily.   metFORMIN 500 MG tablet Commonly known as:  GLUCOPHAGE Take 500 mg by mouth 2 (two) times daily with a meal.   polyethylene glycol packet Commonly known as:  MIRALAX / GLYCOLAX Take 17 g by mouth daily.   potassium chloride SA 20 MEQ tablet Commonly known as:  K-DUR,KLOR-CON Take 2 tablets (40 mEq total) by mouth daily.      No Known Allergies    The results of significant diagnostics from this hospitalization (including imaging, microbiology, ancillary and laboratory) are listed below for reference.    Significant Diagnostic Studies: Ct Head Wo Contrast  Result Date: 02/17/2017 CLINICAL DATA:  83 y/o  F; increased confusion. EXAM: CT HEAD WITHOUT CONTRAST TECHNIQUE: Contiguous axial images were obtained from the base of the skull through the vertex without intravenous contrast. COMPARISON:  04/19/2014 CT head. FINDINGS: Brain: No evidence of acute infarction, hemorrhage,  hydrocephalus, extra-axial collection or mass lesion/mass effect. Stable advanced brain parenchymal volume loss and mild chronic microvascular ischemic changes. Vascular: Calcific atherosclerosis of carotid siphons and vertebral arteries. Skull: Normal. Negative for fracture or focal lesion. Sinuses/Orbits: No acute finding. Other: None. IMPRESSION: 1. No acute intracranial abnormality identified. 2. Stable mild chronic microvascular ischemic changes and advanced parenchymal volume loss of the brain. Electronically Signed   By: Kristine Garbe M.D.   On: 02/17/2017 14:16   Dg Chest Port 1 View  Result Date: 02/19/2017 CLINICAL DATA:  Right pleural effusion. EXAM: PORTABLE CHEST 1 VIEW COMPARISON:  Radiograph February 18, 2017. FINDINGS: Stable cardiomegaly with central pulmonary vascular congestion. Atherosclerosis thoracic aorta is noted. No pneumothorax is noted. Mild bilateral pleural effusions are noted which may be improved compared to prior exam. Bibasilar opacities are noted and improved suggesting improving edema. Bony thorax is unremarkable. IMPRESSION: Findings consistent with mildly improving pulmonary edema and effusions. Aortic atherosclerosis. Electronically Signed   By: Marijo Conception, M.D.   On: 02/19/2017 07:41   Portable Chest 1 View  Result Date: 02/18/2017 CLINICAL  DATA:  Acute on chronic diastolic congestive heart failure. EXAM: PORTABLE CHEST 1 VIEW COMPARISON:  02/17/2017 and 12/10/2016 FINDINGS: Increased bilateral pulmonary edema with increased right pleural effusion at least partially loculated laterally. Increased density at the left base consistent with an effusion. Chronic cardiomegaly. IMPRESSION: Worsening congestive heart failure. Electronically Signed   By: Lorriane Shire M.D.   On: 02/18/2017 09:22   Dg Chest Port 1 View  Result Date: 02/17/2017 CLINICAL DATA:  Hypotension.  Shortness of breath EXAM: PORTABLE CHEST 1 VIEW COMPARISON:  12/10/2016 FINDINGS: Marked  cardiopericardial enlargement. There is increase in diffuse interstitial and airspace opacity with right-sided pleural effusion that could be loculated. No pneumothorax. IMPRESSION: CHF pattern. Electronically Signed   By: Monte Fantasia M.D.   On: 02/17/2017 10:21    Microbiology: Recent Results (from the past 240 hour(s))  Urine culture     Status: None   Collection Time: 02/17/17  9:50 AM  Result Value Ref Range Status   Specimen Description URINE, CATHETERIZED  Final   Special Requests NONE  Final   Culture   Final    NO GROWTH Performed at [facility], [address] [place], [place] [address]    Report Status 02/19/2017 FINAL  Final  MRSA PCR Screening     Status: None   Collection Time: 02/17/17  8:00 PM  Result Value Ref Range Status   MRSA by PCR NEGATIVE NEGATIVE Final    Comment:        The GeneXpert MRSA Assay (FDA approved for NASAL specimens only), is one component of a comprehensive MRSA colonization surveillance program. It is not intended to diagnose MRSA infection nor to guide or monitor treatment for MRSA infections.      Labs: Basic Metabolic Panel:  Recent Labs Lab 02/17/17 0950 02/17/17 1537 02/18/17 0340 02/19/17 0648  NA 139  --  143 136  K 4.4  --  4.0 4.4  CL 103  --  105 100*  CO2 29  --  29 30  GLUCOSE 79  --  70 91  BUN 27*  --  27* 27*  CREATININE 1.00  --  0.86 0.87  CALCIUM 8.4*  --  8.7* 8.1*  MG  --  1.8  --   --    Liver Function Tests:  Recent Labs Lab 02/17/17 0950  AST 23  ALT 19  ALKPHOS 57  BILITOT 0.9  PROT 5.7*  ALBUMIN 2.9*   No results for input(s): LIPASE, AMYLASE in the last 168 hours. No results for input(s): AMMONIA in the last 168 hours. CBC:  Recent Labs Lab 02/17/17 0950 02/18/17 0340  WBC 6.2 8.0  NEUTROABS 4.0  --   HGB 11.5* 12.9  HCT 36.3 42.0  MCV 81.8 82.7  PLT 233 249   Cardiac Enzymes:  Recent Labs Lab 02/17/17 0950  TROPONINI <0.03   BNP: BNP (last 3  results)  Recent Labs  09/27/16 1935 12/19/16 1449 02/17/17 0950  BNP 523.0* 1,286.0* 1,190.0*    ProBNP (last 3 results) No results for input(s): PROBNP in the last 8760 hours.  CBG:  Recent Labs Lab 02/18/17 1113 02/18/17 1642 02/18/17 2033 02/19/17 0733 02/19/17 1120  GLUCAP 126* 134* 149* 100* 127*       Signed:  [facility] Pager: [phone] 02/19/2017, 3:43 PM

## 2017-02-19 NOTE — Clinical Social Work Note (Signed)
Clinical Social Work Assessment  Patient Details  Name: [name] MRN: [id number] Date of Birth: 20-Nov-1920  Date of referral:                  Reason for consult:  Discharge Planning                Permission sought to share information with:    Permission granted to share information::     Name::        Agency::  [facility] at [facility])  Relationship::     Contact Information:     Housing/Transportation Living arrangements for the past 2 months:  [place] of Information:  Patient Patient Interpreter Needed:  None Criminal Activity/Legal Involvement Pertinent to Current Situation/Hospitalization:  No - Comment as needed Significant Relationships:  Adult Children Lives with:  Facility Resident Do you feel safe going back to the place where you live?  Yes Need for family participation in patient care:  Yes (Comment)  Care giving concerns:  None identified. Facility resident.    Social Worker assessment / plan:  Patient has been at [facility] for the past 3 years. Patient bathes and feeds herself. She uses a wheelchair and requires assistance with with transfers. Patient's two sons are supportive. She can return at discharge. Message was left for patient's son Yvone Neu.   Employment status:  Retired Nurse, adult PT Recommendations:  Not assessed at this time Information / Referral to community resources:     Patient/Family's Response to care:  Message left for family   Patient/Family's Understanding of and Emotional Response to Diagnosis, Current Treatment, and Prognosis:  Message left for family.  Emotional Assessment Appearance:  Appears stated age Attitude/Demeanor/Rapport:  Unable to Assess Affect (typically observed):  Unable to Assess Orientation:  Oriented to Self Alcohol / Substance use:  Not Applicable Psych involvement (Current and /or in the community):  No (Comment)  Discharge Needs  Concerns to be addressed:  No  discharge needs identified (Return to [facility]) Readmission within the last 30 days:  No Current discharge risk:  None Barriers to Discharge:  No Barriers Identified   Ihor Gully, LCSW 02/19/2017, 11:49 AM

## 2017-02-19 NOTE — NC FL2 (Signed)
[place] LEVEL OF CARE SCREENING TOOL     IDENTIFICATION  Patient Name: [name] Birthdate: April 19, 1921 Sex: female Admission Date (Current Location): 02/17/2017  [facility] and [place] Number:  [employer] and Address:  [facility] [address] [place]      Provider Number: [id number]  Attending Physician Name and Address:  Isaac Bliss, [place]  Relative Name and Phone Number:       Current Level of Care: Hospital Recommended Level of Care: [place] Prior Approval Number:    Date Approved/Denied:   PASRR Number:    Discharge Plan: SNF    Current Diagnoses: Patient Active Problem List   Diagnosis Date Noted  . Pressure injury of skin 02/18/2017  . Acute diastolic CHF (congestive heart failure) ([place]) 02/18/2017  . Acute encephalopathy 02/18/2017  . Atrial fibrillation, rapid ([place]) 02/18/2017  . Acute on chronic diastolic congestive heart failure ([place]) 02/17/2017  . Permanent atrial fibrillation ([place]) 12/27/2016  . CAD in native artery 12/27/2016  . SIRS (systemic inflammatory response syndrome) ([place]) 12/10/2016  . Elevated troponin 12/10/2016  . SOB (shortness of breath)   . Lewy body dementia without behavioral disturbance   . Acute hypoxemic respiratory failure ([place]) 03/18/2016  . Hyponatremia 03/18/2016  . Abdominal distention 04/13/2014  . Hip pain 04/12/2014  . Labral tear of hip, degenerative 04/12/2014  . Trochanteric bursitis of right hip 04/12/2014  . Encounter for therapeutic drug monitoring 10/06/2013  . Lower extremity edema 08/20/2011  . Arteriosclerotic cardiovascular disease (ASCVD)   . Carcinoma of breast ([place])   . Atrial fibrillation with RVR ([place])   . MVP (mitral valve prolapse)   . PVD (peripheral vascular disease) ([place])   . CVD (cerebrovascular disease)   . Thyroid activity decreased   . Osteoporosis   . Chronic anticoagulation 11/22/2010  . Type 2 diabetes mellitus  ([place]) 11/21/2009  . Hyperlipidemia 11/21/2009  . Hypertension 11/21/2009  . Acute on chronic diastolic heart failure ([place]) 11/21/2009  . SPINAL STENOSIS, LUMBAR 09/05/2008    Orientation RESPIRATION BLADDER Height & Weight     Self  Normal Incontinent Weight: 119 lb 0.8 oz (54 kg) Height:  5\' 3"  (160 cm)  BEHAVIORAL SYMPTOMS/MOOD NEUROLOGICAL BOWEL NUTRITION STATUS      Continent Diet (Heart healthy/carb modified. fluid restrictuion 1263mL fluid)  AMBULATORY STATUS COMMUNICATION OF NEEDS Skin   Extensive Assist Verbally PU Stage and Appropriate Care ([place])                       Personal Care Assistance Level of Assistance  Bathing, Feeding, Dressing Bathing Assistance: Limited assistance Feeding assistance: Independent Dressing Assistance: Limited assistance     Functional Limitations Info  Sight, Hearing, Speech Sight Info: Adequate Hearing Info: Adequate Speech Info: Adequate    SPECIAL CARE FACTORS FREQUENCY                       Contractures Contractures Info: Not present    Additional Factors Info  Code Status Code Status Info: Partial Code             Current Medications (02/19/2017):  This is the current hospital active medication list Current Facility-Administered Medications  Medication Dose Route Frequency Provider Last Rate Last Dose  . acetaminophen (TYLENOL) tablet 650 mg  650 mg Oral Q6H PRN Samuella Cota, MD      . apixaban [facility]) tablet 2.5 mg  2.5  mg Oral BID Reubin Milan, MD   2.5 mg at 02/19/17 1038  . atorvastatin (LIPITOR) tablet 20 mg  20 mg Oral q1800 Reubin Milan, MD   20 mg at 02/18/17 1715  . calcium carbonate (TUMS - dosed in mg elemental calcium) chewable tablet 200 mg of elemental calcium  1 tablet Oral TID AC Reubin Milan, MD   200 mg of elemental calcium at 02/19/17 0838  . cholecalciferol (VITAMIN D) tablet 1,000 Units  1,000 Units Oral Daily Reubin Milan, MD   1,000 Units at 02/19/17  1038  . diltiazem (CARDIZEM CD) 24 hr capsule 120 mg  120 mg Oral Daily Reubin Milan, MD   120 mg at 02/19/17 1039  . furosemide (LASIX) injection 20 mg  20 mg Intravenous BID Samuella Cota, MD   20 mg at 02/19/17 0839  . gabapentin (NEURONTIN) capsule 200 mg  200 mg Oral Q8H Reubin Milan, MD   200 mg at 02/19/17 0539  . isosorbide mononitrate (IMDUR) 24 hr tablet 30 mg  30 mg Oral Daily Reubin Milan, MD   30 mg at 02/19/17 1039  . levalbuterol (XOPENEX) nebulizer solution 0.63 mg  0.63 mg Inhalation TID PRN Reubin Milan, MD      . levothyroxine (SYNTHROID, LEVOTHROID) tablet 25 mcg  25 mcg Oral QPM Reubin Milan, MD   25 mcg at 02/18/17 1715  . lisinopril (PRINIVIL,ZESTRIL) tablet 20 mg  20 mg Oral Daily Reubin Milan, MD   20 mg at 02/19/17 1038  . MEDLINE mouth rinse  15 mL Mouth Rinse BID Reubin Milan, MD   15 mL at 02/19/17 1000  . ondansetron (ZOFRAN) tablet 4 mg  4 mg Oral Q6H PRN Reubin Milan, MD       Or  . ondansetron [facility]) injection 4 mg  4 mg Intravenous Q6H PRN Reubin Milan, MD      . polyethylene glycol [facility]) packet 17 g  17 g Oral Daily Reubin Milan, MD   17 g at 02/19/17 1050  . potassium chloride SA (K-DUR,KLOR-CON) CR tablet 40 mEq  40 mEq Oral Daily Reubin Milan, MD   40 mEq at 02/19/17 1038     Discharge Medications: Please see discharge summary for a list of discharge medications.  Relevant Imaging Results:  Relevant Lab Results:   Additional Information    Tressa Maldonado, [name], LCSW

## 2017-02-19 NOTE — Care Management Important Message (Signed)
Important Message  Patient Details  Name: [name] MRN: [id number] Date of Birth: Mar 02, 1921   Medicare Important Message Given:  Yes    Sherald Barge, RN 02/19/2017, 3:04 PM

## 2017-02-19 NOTE — Care Management Note (Signed)
Case Management Note  Patient Details  Name: [name] MRN: [id number] Date of Birth: May 01, 1921  Expected Discharge Date:     02/19/2017             Expected Discharge Plan:  [place]  In-House Referral:  Clinical Social Work  Discharge planning Services  CM Consult  Status of Service:  Completed, signed off  Additional Comments: CM notified by MD pt ready for DC. CM left VM for [name], [name] notifying her pt will be discharging today. CSW to make arrangements for return to facility.   Sherald Barge, RN 02/19/2017, 3:02 PM

## 2017-03-03 ENCOUNTER — Ambulatory Visit (INDEPENDENT_AMBULATORY_CARE_PROVIDER_SITE_OTHER): Payer: Medicare Other | Admitting: Adult Health

## 2017-03-03 ENCOUNTER — Encounter: Payer: Self-pay | Admitting: Adult Health

## 2017-03-03 VITALS — BP 126/82 | HR 106 | Ht 66.0 in | Wt 116.0 lb

## 2017-03-03 DIAGNOSIS — I050 Rheumatic mitral stenosis: Secondary | ICD-10-CM

## 2017-03-03 DIAGNOSIS — I5033 Acute on chronic diastolic (congestive) heart failure: Secondary | ICD-10-CM

## 2017-03-03 DIAGNOSIS — I481 Persistent atrial fibrillation: Secondary | ICD-10-CM | POA: Diagnosis not present

## 2017-03-03 DIAGNOSIS — I4819 Other persistent atrial fibrillation: Secondary | ICD-10-CM

## 2017-03-03 NOTE — Progress Notes (Signed)
Cardiology Office Note   Date:  03/03/2017   ID:  Nijah, Tejera Feb 27, 1921, MRN [id number]  PCP:  Hilbert Corrigan, MD  Cardiologist:  Dorris Carnes  No chief complaint on file.     History of Present Illness: DAVI ROTAN is a 83 y.o. female who presents for ongoing assessment and management of atrial fibrillation, coronary artery disease (drug-eluting stent to the LAD in 2004), chronic diastolic heart failure, hypertension, with other history to include mentioned hypothyroidism.  She was seen last in the office on 01/30/2017 after a hospitalization at [facility] for CHF and tachycardia in May 2018. On that visit her volume status was elevated, with increased weight of 3 pounds. The patient's Lasix was increased to 60 mg in a.m. and 40 mg in the p.m. BMET and BNP was ordered with follow-up BMET and BNP in 2 weeks.  On 02/17/2017 BNP 1190. Sodium 136 potassium 4.4 chloride 100 CO2 30 glucose 91 BUN 27 creatinine 0.87.  She is here for follow-up concerning her response to medication and symptoms. She has lost 5 lbs since being seen last. She is a resident of [facility]. NP there has increased her lasix to 60 mg in the am and 40 mg in the pm. Follow up labs were completed but I do not have a copy of this.   Son who is with her states that she is not eating well. She has been more depressed lately. Her long time pet, a cat, recently died, and this has impacted her greatly. She does not want participate in PT , and is eating little during the day with the exception of breakfast.   Past Medical History:  Diagnosis Date  . Arteriosclerotic cardiovascular disease (ASCVD)    a. Cath 12/2002 showed high grade LAD stenosis treated with DES, also had residual CAD treated medically at that time with 50% + 70-80% mid-septal perforator, 80% D1, 70% OM1, 50% ostial RCA, 25% mRCA.  . Bronchitis    history  . Carcinoma of breast ([place])    left masectomy in 1995  . Chronic diastolic heart failure  ([place]) 11/21/2009  . Chronic hoarseness   . CKD (chronic kidney disease), stage II    per historical labs for age, weight, Cr  . CVD (cerebrovascular disease)    plaque w/o focal disease in 2006; h/o CVA  . Dementia   . Diabetes mellitus type II    no insulin   . Edema   . Herpes zoster   . Hypertension   . Hypothyroid   . Lower extremity edema 08/20/2011  . MVP (mitral valve prolapse)    moderate; with moderate MR  . Osteoporosis   . Peripheral neuropathy   . Permanent atrial fibrillation ([place])   . PVD (peripheral vascular disease) (HCC)    ABIs of 0.64 and 0.59, right and left leg in 2009  . 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 Shingles   . Ulcer    left lower leg  . Varicose veins of legs 08/20/2011    Past Surgical History:  Procedure Laterality Date  . ABDOMINAL HYSTERECTOMY     abd?  . CATARACT EXTRACTION, BILATERAL    . CHOLECYSTECTOMY  2006  . COLONOSCOPY  Date unknown  . CORONARY ANGIOPLASTY WITH STENT PLACEMENT    . left masectomy  1995  . ORIF of left hip  05/22/06   [name]     Current Outpatient Prescriptions  Medication Sig Dispense Refill  . acetaminophen (TYLENOL) 325  MG tablet Take 650 mg by mouth 2 (two) times daily as needed for moderate pain or fever.     Marland Kitchen apixaban (ELIQUIS) 2.5 MG TABS tablet Take 1 tablet (2.5 mg total) by mouth 2 (two) times daily. 60 tablet 2  . atorvastatin (LIPITOR) 20 MG tablet Take 20 mg by mouth daily.    . bisoprolol (ZEBETA) 5 MG tablet Take 5 mg by mouth daily.    . calcium carbonate (TUMS - DOSED IN MG ELEMENTAL CALCIUM) 500 MG chewable tablet Chew 1 tablet by mouth 3 (three) times daily before meals.    . cholecalciferol (VITAMIN D) 1000 units tablet Take 1,000 Units by mouth daily.    Marland Kitchen diltiazem (CARDIZEM CD) 120 MG 24 hr capsule Take 1 capsule (120 mg total) by mouth daily. 30 capsule 2  . furosemide (LASIX) 40 MG tablet Take 60 mg in am ( 1 1/2 tabs) and 40 mg in afternoon 225 tablet 3  . gabapentin (NEURONTIN) 100 MG  capsule Take 200 mg by mouth every 8 (eight) hours.     . isosorbide mononitrate (IMDUR) 30 MG 24 hr tablet Take 30 mg by mouth daily.    Marland Kitchen levalbuterol (XOPENEX) 0.63 MG/3ML nebulizer solution Inhale 3 mLs into the lungs 3 (three) times daily as needed for wheezing or cough. Via nebulizer    . levothyroxine (SYNTHROID, LEVOTHROID) 25 MCG tablet Take 25 mcg by mouth every evening.     Marland Kitchen lisinopril (PRINIVIL,ZESTRIL) 20 MG tablet Take 1 tablet (20 mg total) by mouth daily.    . metFORMIN (GLUCOPHAGE) 500 MG tablet Take 500 mg by mouth 2 (two) times daily with a meal.    . polyethylene glycol (MIRALAX / GLYCOLAX) packet Take 17 g by mouth daily.    . potassium chloride SA (K-DUR,KLOR-CON) 20 MEQ tablet Take 2 tablets (40 mEq total) by mouth daily.     No current facility-administered medications for this visit.     Allergies:   Patient has no known allergies.    Social History:  The patient  reports that she has never smoked. She has never used smokeless tobacco. She reports that she does not drink alcohol or use drugs.   Family History:  The patient's family history includes Diabetes in her mother.    ROS: All other systems are reviewed and negative. Unless otherwise mentioned in H&P    PHYSICAL EXAM: VS:  There were no vitals taken for this visit. , BMI There is no height or weight on file to calculate BMI. GEN: Well nourished, well developed, in no acute distress  HEENT: normal  Neck: no JVD, carotid bruits, or masses Cardiac: RRR; high pitched systolic murmur, no rubs, or gallops, 2+ pitting pretibial  edema to the knees bilaterally.  Respiratory:  Some crackles but no wheezes or coughing.  GI: soft, nontender, nondistended, + BS MS: no deformity or atrophy  Skin: warm and dry, no rash Neuro:  Strength and sensation are intact. Hard of hearing.  Psych: euthymic mood, full affect. Some mild confusion.    Recent Labs: 12/10/2016: TSH 0.465 02/17/2017: ALT 19; B Natriuretic Peptide  1,190.0; Magnesium 1.8 02/18/2017: Hemoglobin 12.9; Platelets 249 02/19/2017: BUN 27; Creatinine, Ser 0.87; Potassium 4.4; Sodium 136    Lipid Panel    Component Value Date/Time   CHOL  10/19/2010 0615    120        ATP III CLASSIFICATION:  <200     mg/dL   Desirable  200-239  mg/dL  Borderline High  >=240    mg/dL   High          TRIG 74 10/19/2010 0615   HDL 39 (L) 10/19/2010 0615   CHOLHDL 3.1 10/19/2010 0615   VLDL 15 10/19/2010 0615   LDLCALC  10/19/2010 0615    66        Total Cholesterol/HDL:CHD Risk Coronary Heart Disease Risk Table                     Men   Women  1/2 Average Risk   3.4   3.3  Average Risk       5.0   4.4  2 X Average Risk   9.6   7.1  3 X Average Risk  23.4   11.0        Use the calculated Patient Ratio above and the CHD Risk Table to determine the patient's CHD Risk.        ATP III CLASSIFICATION (LDL):  <100     mg/dL   Optimal  100-129  mg/dL   Near or Above                    Optimal  130-159  mg/dL   Borderline  160-189  mg/dL   High  >190     mg/dL   Very High      Wt Readings from Last 3 Encounters:  02/19/17 125 lb 10.6 oz (57 kg)  01/30/17 121 lb (54.9 kg)  12/27/16 114 lb (51.7 kg)      Other studies Reviewed: Echocardiogram 2017-04-09 Left ventricle: The cavity size was normal. There was focal basal   hypertrophy. Systolic function was normal. The estimated ejection   fraction was in the range of 60% to 65%. Wall motion was normal;   there were no regional wall motion abnormalities. - Aortic valve: Moderately calcified annulus. Trileaflet; mildly   thickened leaflets. Valve area (VTI): 1.55 cm^2. Valve area   (Vmax): 1.4 cm^2. Valve area (Vmean): 1.39 cm^2. - Mitral valve: Moderately calcified annulus. Mildly thickened   leaflets . There was mild regurgitation. - Left atrium: The atrium was massively dilated. - Right atrium: The atrium was severely dilated. - Atrial septum: No defect or patent foramen ovale was  identified. - Pulmonary arteries: Systolic pressure was moderately increased.   PA peak pressure: 47 mm Hg (S). - Pericardium, extracardiac: There is a large left pleural   effusion. There is a small circumferential pericardial effusion.  ASSESSMENT AND PLAN:  1. Chronic Dependent Edema; She is sedentary and keeps legs in dependent position when sitting. She has 2+ pretibial edema despite increased dose of lasix to 60 mg in am and 40 mg in pm.She is a poor historian and is unable to tell me if she has noticed a change in her edema or breathing status. Her son who is with her has not idea about how she has been doing.   He states she has been more depressed and does not want to participate in PT since her cat died.   I have requested copy of her BMET and CBC for evaluation. Son states that she got an IV of "some sort of medicine" having to do with her potassium.   I have requested that she wear TED hose to assist in venous return.   2. Poor appetite: I have requested Boost nutrition drinks be added to her meal trays to assist with protein and energy. Recommend megace appetite  stimulant.   3,. Mitral valve disease:  Repeating echo for changes in LV and worsening valvular disease for medical management.   4. Atrial fib: Rate is currently controlled. Continue apixaban. Awaiting labs to evaluate for anemia causing fluid retention.   Current medicines are reviewed at length with the patient today.    Labs/ tests ordered today include: Echo., Requesting labs.  Phill Myron. West Pugh, ANP, AACC   03/03/2017 7:09 AM    [facility] [address], [place], [place] [address] Phone: [phone]; Fax: [phone]

## 2017-03-03 NOTE — Patient Instructions (Signed)
Medication Instructions: Your physician recommends that you continue on your current medications as directed. Please refer to the Current Medication list given to you today.   Labwork: I will request a copy of labs  Testing/Procedures: Your physician has requested that you have an echocardiogram. Echocardiography is a painless test that uses sound waves to create images of your heart. It provides your doctor with information about the size and shape of your heart and how well your heart's chambers and valves are working. This procedure takes approximately one hour. There are no restrictions for this procedure.   Follow-Up: Your physician recommends that you schedule a follow-up appointment in: 2 months   Any Other Special Instructions Will Be Listed Below (If Applicable). Please wear knee-hi's 9 (TED HOSE)    If you need a refill on your cardiac medications before your next appointment, please call your pharmacy.

## 2017-03-07 ENCOUNTER — Emergency Department (HOSPITAL_COMMUNITY): Payer: Medicare Other

## 2017-03-07 ENCOUNTER — Observation Stay (HOSPITAL_COMMUNITY)
Admission: EM | Admit: 2017-03-07 | Discharge: 2017-03-09 | Disposition: A | Discharge status: Home / Self Care | Payer: Medicare Other | Attending: Internal Medicine | Admitting: Internal Medicine

## 2017-03-07 ENCOUNTER — Ambulatory Visit (HOSPITAL_COMMUNITY): Admission: RE | Admit: 2017-03-07 | Payer: Medicare Other | Source: Ambulatory Visit

## 2017-03-07 ENCOUNTER — Encounter (HOSPITAL_COMMUNITY): Payer: Self-pay | Admitting: Emergency Medicine

## 2017-03-07 DIAGNOSIS — N179 Acute kidney failure, unspecified: Secondary | ICD-10-CM | POA: Diagnosis present

## 2017-03-07 DIAGNOSIS — Z7901 Long term (current) use of anticoagulants: Secondary | ICD-10-CM | POA: Diagnosis not present

## 2017-03-07 DIAGNOSIS — E119 Type 2 diabetes mellitus without complications: Secondary | ICD-10-CM | POA: Diagnosis not present

## 2017-03-07 DIAGNOSIS — L03116 Cellulitis of left lower limb: Secondary | ICD-10-CM | POA: Diagnosis not present

## 2017-03-07 DIAGNOSIS — G3183 Dementia with Lewy bodies: Secondary | ICD-10-CM | POA: Insufficient documentation

## 2017-03-07 DIAGNOSIS — I5032 Chronic diastolic (congestive) heart failure: Secondary | ICD-10-CM | POA: Diagnosis not present

## 2017-03-07 DIAGNOSIS — I2510 Atherosclerotic heart disease of native coronary artery without angina pectoris: Secondary | ICD-10-CM | POA: Insufficient documentation

## 2017-03-07 DIAGNOSIS — Z7984 Long term (current) use of oral hypoglycemic drugs: Secondary | ICD-10-CM | POA: Diagnosis not present

## 2017-03-07 DIAGNOSIS — E1122 Type 2 diabetes mellitus with diabetic chronic kidney disease: Secondary | ICD-10-CM | POA: Insufficient documentation

## 2017-03-07 DIAGNOSIS — R4182 Altered mental status, unspecified: Principal | ICD-10-CM | POA: Insufficient documentation

## 2017-03-07 DIAGNOSIS — I130 Hypertensive heart and chronic kidney disease with heart failure and stage 1 through stage 4 chronic kidney disease, or unspecified chronic kidney disease: Secondary | ICD-10-CM | POA: Insufficient documentation

## 2017-03-07 DIAGNOSIS — E860 Dehydration: Secondary | ICD-10-CM | POA: Insufficient documentation

## 2017-03-07 DIAGNOSIS — F0280 Dementia in other diseases classified elsewhere without behavioral disturbance: Secondary | ICD-10-CM | POA: Diagnosis present

## 2017-03-07 DIAGNOSIS — Z79899 Other long term (current) drug therapy: Secondary | ICD-10-CM | POA: Diagnosis not present

## 2017-03-07 DIAGNOSIS — Z853 Personal history of malignant neoplasm of breast: Secondary | ICD-10-CM | POA: Diagnosis not present

## 2017-03-07 DIAGNOSIS — I482 Chronic atrial fibrillation: Secondary | ICD-10-CM | POA: Diagnosis not present

## 2017-03-07 DIAGNOSIS — N182 Chronic kidney disease, stage 2 (mild): Secondary | ICD-10-CM | POA: Insufficient documentation

## 2017-03-07 DIAGNOSIS — Z955 Presence of coronary angioplasty implant and graft: Secondary | ICD-10-CM | POA: Insufficient documentation

## 2017-03-07 DIAGNOSIS — L0390 Cellulitis, unspecified: Secondary | ICD-10-CM

## 2017-03-07 DIAGNOSIS — E039 Hypothyroidism, unspecified: Secondary | ICD-10-CM | POA: Diagnosis not present

## 2017-03-07 DIAGNOSIS — I4821 Permanent atrial fibrillation: Secondary | ICD-10-CM | POA: Diagnosis present

## 2017-03-07 HISTORY — DX: Hypo-osmolality and hyponatremia: E87.1

## 2017-03-07 HISTORY — DX: Malignant neoplasm of unspecified site of unspecified female breast: C50.919

## 2017-03-07 HISTORY — DX: Hyperlipidemia, unspecified: E78.5

## 2017-03-07 LAB — URINALYSIS, ROUTINE W REFLEX MICROSCOPIC
Bilirubin Urine: NEGATIVE
GLUCOSE, UA: NEGATIVE mg/dL
HGB URINE DIPSTICK: NEGATIVE
Ketones, ur: NEGATIVE mg/dL
LEUKOCYTES UA: NEGATIVE
Nitrite: NEGATIVE
PH: 5 (ref 5.0–8.0)
Protein, ur: NEGATIVE mg/dL
SPECIFIC GRAVITY, URINE: 1.011 (ref 1.005–1.030)

## 2017-03-07 LAB — CBC WITH DIFFERENTIAL/PLATELET
BASOS ABS: 0 10*3/uL (ref 0.0–0.1)
Basophils Relative: 0 %
EOS ABS: 0 10*3/uL (ref 0.0–0.7)
Eosinophils Relative: 1 %
HCT: 47.4 % — ABNORMAL HIGH (ref 36.0–46.0)
HEMOGLOBIN: 15 g/dL (ref 12.0–15.0)
Lymphocytes Relative: 19 %
Lymphs Abs: 1.5 10*3/uL (ref 0.7–4.0)
MCH: 26 pg (ref 26.0–34.0)
MCHC: 31.6 g/dL (ref 30.0–36.0)
MCV: 82.1 fL (ref 78.0–100.0)
MONOS PCT: 8 %
Monocytes Absolute: 0.7 10*3/uL (ref 0.1–1.0)
NEUTROS PCT: 72 %
Neutro Abs: 5.8 10*3/uL (ref 1.7–7.7)
Platelets: 253 10*3/uL (ref 150–400)
RBC: 5.77 MIL/uL — ABNORMAL HIGH (ref 3.87–5.11)
RDW: 19.4 % — ABNORMAL HIGH (ref 11.5–15.5)
WBC: 8 10*3/uL (ref 4.0–10.5)

## 2017-03-07 LAB — BASIC METABOLIC PANEL
Anion gap: 12 (ref 5–15)
BUN: 52 mg/dL — ABNORMAL HIGH (ref 6–20)
CALCIUM: 9 mg/dL (ref 8.9–10.3)
CHLORIDE: 104 mmol/L (ref 101–111)
CO2: 23 mmol/L (ref 22–32)
CREATININE: 1.31 mg/dL — AB (ref 0.44–1.00)
GFR calc non Af Amer: 34 mL/min — ABNORMAL LOW (ref >60)
GFR, EST AFRICAN AMERICAN: 39 mL/min — AB (ref >60)
Glucose, Bld: 148 mg/dL — ABNORMAL HIGH (ref 65–99)
Potassium: 5.3 mmol/L — ABNORMAL HIGH (ref 3.5–5.1)
Sodium: 139 mmol/L (ref 135–145)

## 2017-03-07 LAB — TROPONIN I: Troponin I: <0.03 ng/mL (ref <0.03)

## 2017-03-07 MED ORDER — LEVALBUTEROL HCL 0.63 MG/3ML IN NEBU: 0.6300 mg | INHALATION_SOLUTION | Freq: Four times a day (QID) | RESPIRATORY_TRACT | Status: DC | PRN

## 2017-03-07 MED ORDER — ONDANSETRON HCL 4 MG/2ML IJ SOLN: 4.0000 mg | Freq: Four times a day (QID) | INTRAMUSCULAR | Status: DC | PRN

## 2017-03-07 MED ORDER — LEVOTHYROXINE SODIUM 25 MCG PO TABS
25.0000 ug | ORAL_TABLET | Freq: Every evening | ORAL | Status: DC
  Administered 2017-03-08 – 2017-03-09 (×2): 25 ug via ORAL
  Filled 2017-03-07 (×2): qty 1

## 2017-03-07 MED ORDER — ACETAMINOPHEN 325 MG PO TABS
650.0000 mg | ORAL_TABLET | Freq: Two times a day (BID) | ORAL | Status: DC | PRN
  Administered 2017-03-09 (×2): 650 mg via ORAL
  Filled 2017-03-07 (×2): qty 2

## 2017-03-07 MED ORDER — APIXABAN 2.5 MG PO TABS
2.5000 mg | ORAL_TABLET | Freq: Two times a day (BID) | ORAL | Status: DC
  Administered 2017-03-08 – 2017-03-09 (×4): 2.5 mg via ORAL
  Filled 2017-03-07 (×4): qty 1

## 2017-03-07 MED ORDER — POLYETHYLENE GLYCOL 3350 17 G PO PACK
17.0000 g | PACK | Freq: Every day | ORAL | Status: DC
  Administered 2017-03-08 – 2017-03-09 (×3): 17 g via ORAL
  Filled 2017-03-07 (×3): qty 1

## 2017-03-07 MED ORDER — BISOPROLOL FUMARATE 5 MG PO TABS
5.0000 mg | ORAL_TABLET | Freq: Every day | ORAL | Status: DC
  Administered 2017-03-08 – 2017-03-09 (×3): 5 mg via ORAL
  Filled 2017-03-07 (×3): qty 1

## 2017-03-07 MED ORDER — GABAPENTIN 100 MG PO CAPS
200.0000 mg | ORAL_CAPSULE | Freq: Three times a day (TID) | ORAL | Status: DC
  Administered 2017-03-08 – 2017-03-09 (×6): 200 mg via ORAL
  Filled 2017-03-07 (×6): qty 2

## 2017-03-07 MED ORDER — ISOSORBIDE MONONITRATE ER 60 MG PO TB24
30.0000 mg | ORAL_TABLET | Freq: Every day | ORAL | Status: DC
  Administered 2017-03-08 – 2017-03-09 (×2): 30 mg via ORAL
  Filled 2017-03-07 (×2): qty 1

## 2017-03-07 MED ORDER — DEXTROSE 5 % IV SOLN
1.0000 g | INTRAVENOUS | Status: DC
  Administered 2017-03-08 (×2): 1 g via INTRAVENOUS
  Filled 2017-03-07 (×4): qty 10

## 2017-03-07 MED ORDER — SODIUM CHLORIDE 0.9 % IV SOLN
INTRAVENOUS | Status: AC
  Administered 2017-03-07: via INTRAVENOUS

## 2017-03-07 MED ORDER — ATORVASTATIN CALCIUM 20 MG PO TABS
20.0000 mg | ORAL_TABLET | Freq: Every day | ORAL | Status: DC
  Administered 2017-03-08 – 2017-03-09 (×2): 20 mg via ORAL
  Filled 2017-03-07 (×2): qty 1

## 2017-03-07 MED ORDER — DILTIAZEM HCL ER COATED BEADS 120 MG PO CP24
120.0000 mg | ORAL_CAPSULE | Freq: Every day | ORAL | Status: DC
  Administered 2017-03-08 – 2017-03-09 (×2): 120 mg via ORAL
  Filled 2017-03-07 (×2): qty 1

## 2017-03-07 MED ORDER — SODIUM CHLORIDE 0.9 % IV BOLUS (SEPSIS)
500.0000 mL | Freq: Once | INTRAVENOUS | Status: AC
  Administered 2017-03-07: 500 mL via INTRAVENOUS

## 2017-03-07 MED ORDER — INSULIN ASPART 100 UNIT/ML ~~LOC~~ SOLN
0.0000 [IU] | Freq: Three times a day (TID) | SUBCUTANEOUS | Status: DC
  Administered 2017-03-08: 1 [IU] via SUBCUTANEOUS
  Administered 2017-03-09: 2 [IU] via SUBCUTANEOUS

## 2017-03-07 MED ORDER — ONDANSETRON HCL 4 MG PO TABS: 4.0000 mg | ORAL_TABLET | Freq: Four times a day (QID) | ORAL | Status: DC | PRN

## 2017-03-07 NOTE — ED Notes (Signed)
EDP at bedside  

## 2017-03-07 NOTE — H&P (Addendum)
TRH H&P    Patient Demographics:    [name], is a 83 y.o. female  MRN: [id number]  DOB - 1921/03/23  Admit Date - 03/07/2017  Referring MD/NP/PA: Dr. Lacinda Axon  Outpatient Primary MD for the patient is [name] Amabile, Anthony Sar, MD  Patient coming from: Skilled nursing facility  Chief Complaint  Patient presents with  . Altered Mental Status      HPI:    [name]  is a 83 y.o. female, With history of chronic diastole CHF, type Is mellitus, hyperlipidemia, hypertension body dementia, atrial fibrillation on chronic anticoagulation with eliquis who was recently discharged from hospital on 02/19/2017,After she was treated for acute on chronic diastole CHF. Patient was discharged on Lasix 60 mg in a.m. and 40 mg in afternoon. Patient has been taking this medication and has been becoming progressively lethargic over the past few days as per patient's son. Today patient was lethargic and not responding, she was sent to hospital for further evaluation.  In the ED level showed acute kidney injury with BUN 52, creatinine 1.31, potassium 5.3.  Patient is unable to provide history she is lethargic, opens eyes to painful stimuli but not answering questions. As per patient's son she had was bowel movement yesterday and today. No history of nausea vomiting. No chest pain or shortness of breath. Patient has been asking for water for past few days  as per patient's son. She has not been getting enough water as patient is on water restriction diet.    Review of systems:     As in history of present illness, other review of systems unobtainable as patient is unable to provide any history   With Past History of the following :    Past Medical History:  Diagnosis Date  . A-fib ([place])   . Arteriosclerotic cardiovascular disease (ASCVD)    a. Cath 12/2002 showed high grade LAD stenosis treated with DES, also had residual  CAD treated medically at that time with 50% + 70-80% mid-septal perforator, 80% D1, 70% OM1, 50% ostial RCA, 25% mRCA.  . Bronchitis    history  . Carcinoma of breast ([place])    left masectomy in 1995  . CHF (congestive heart failure) ([place])   . Chronic diastolic heart failure ([place]) 11/21/2009  . Chronic hoarseness   . CKD (chronic kidney disease), stage II    per historical labs for age, weight, Cr  . CVD (cerebrovascular disease)    plaque w/o focal disease in 2006; h/o CVA  . Dementia   . Dementia   . Diabetes mellitus type II    no insulin   . Edema   . Herpes zoster   . Hyperlipidemia   . Hypertension   . Hyponatremia   . Hypothyroid   . Lower extremity edema 08/20/2011  . Malignant neoplasm of breast (female) ([place])   . MVP (mitral valve prolapse)    moderate; with moderate MR  . Osteoporosis   . Peripheral neuropathy   . Permanent atrial fibrillation ([place])   . PVD (peripheral vascular disease) (  [place])    ABIs of 0.64 and 0.59, right and left leg in 2009  . PVD (peripheral vascular disease) ([place])   . 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 Shingles   . Ulcer    left lower leg  . Varicose veins of legs 08/20/2011      Past Surgical History:  Procedure Laterality Date  . ABDOMINAL HYSTERECTOMY     abd?  . CARDIAC SURGERY    . CATARACT EXTRACTION, BILATERAL    . CHOLECYSTECTOMY  2006  . COLONOSCOPY  Date unknown  . CORONARY ANGIOPLASTY WITH STENT PLACEMENT    . left masectomy  1995  . ORIF of left hip  05/22/06   [name]      Social History:      Social History  Substance Use Topics  . Smoking status: Never Smoker  . Smokeless tobacco: Never Used     Comment: tobacco use - no   . Alcohol use No       Family History :     Family History  Problem Relation Age of Onset  . Diabetes Mother       Home Medications:   Prior to Admission medications   Medication Sig Start Date End Date Taking? Authorizing Provider  acetaminophen (TYLENOL) 325 MG tablet Take 650 mg  by mouth 2 (two) times daily as needed for moderate pain or fever.     [provider]  apixaban (ELIQUIS) 2.5 MG TABS tablet Take 1 tablet (2.5 mg total) by mouth 2 (two) times daily. 12/11/16   Isaac Bliss, Rayford Halsted, MD  atorvastatin (LIPITOR) 20 MG tablet Take 20 mg by mouth daily.    [provider]  bisoprolol (ZEBETA) 5 MG tablet Take 5 mg by mouth daily.    [provider]  calcium carbonate (TUMS - DOSED IN MG ELEMENTAL CALCIUM) 500 MG chewable tablet Chew 1 tablet by mouth 3 (three) times daily before meals.    [provider]  cholecalciferol (VITAMIN D) 1000 units tablet Take 1,000 Units by mouth daily.    [provider]  diltiazem (CARDIZEM CD) 120 MG 24 hr capsule Take 1 capsule (120 mg total) by mouth daily. 09/30/16   Orvan Falconer, MD  furosemide (LASIX) 40 MG tablet Take 60 mg in am ( 1 1/2 tabs) and 40 mg in afternoon 01/30/17   Fay Records, MD  gabapentin (NEURONTIN) 100 MG capsule Take 200 mg by mouth every 8 (eight) hours.     [provider]  isosorbide mononitrate (IMDUR) 30 MG 24 hr tablet Take 30 mg by mouth daily.    [provider]  levalbuterol Penne Lash) 0.63 MG/3ML nebulizer solution Inhale 3 mLs into the lungs 3 (three) times daily as needed for wheezing or cough. Via nebulizer 12/17/16   [provider]  levothyroxine (SYNTHROID, LEVOTHROID) 25 MCG tablet Take 25 mcg by mouth every evening.     [provider]  lisinopril (PRINIVIL,ZESTRIL) 20 MG tablet Take 1 tablet (20 mg total) by mouth daily. 03/21/16   Barton Dubois, MD  metFORMIN (GLUCOPHAGE) 500 MG tablet Take 500 mg by mouth 2 (two) times daily with a meal.    [provider]  polyethylene glycol (MIRALAX / GLYCOLAX) packet Take 17 g by mouth daily.    [provider]  potassium chloride SA (K-DUR,KLOR-CON) 20 MEQ tablet Take 2 tablets (40 mEq total) by mouth daily. 03/21/16   Barton Dubois, MD     Allergies:  No Known Allergies   Physical Exam:   Vitals  Blood pressure 108/69, pulse 91, temperature 98 F (36.7 C), temperature source Rectal, resp. rate (!) 22, height 5\' 6"  (1.676 m), weight 52.6 kg (116 lb), SpO2 99 %.  1.  General: Lethargic, opens eyes to pain.   2. Psychiatric: Not tested  3. Neurologic: Moving all 4 extremities  4. Eyes :  anicteric sclerae, moist conjunctivae with no lid lag.   5. ENMT:  Dry mucous membranes  6. Neck:  supple, no cervical lymphadenopathy appriciated, No thyromegaly  7. Respiratory : Normal respiratory effort, good air movement bilaterally,clear to  auscultation bilaterally  8. Cardiovascular : RRR, no gallops, rubs or murmurs, no leg edema  9. Gastrointestinal:  Positive bowel sounds, abdomen soft, non-tender to palpation,no hepatosplenomegaly, no rigidity or guarding       10. Skin:  No cyanosis, decreased skin turgor. Mild erythema, positive warmth and tenderness noted in left lower extremity.  11.Musculoskeletal:  Good muscle tone,  joints appear normal , no effusions,  normal range of motion    Data Review:    CBC  Recent Labs Lab 03/07/17 1756  WBC 8.0  HGB 15.0  HCT 47.4*  PLT 253  MCV 82.1  MCH 26.0  MCHC 31.6  RDW 19.4*  LYMPHSABS 1.5  MONOABS 0.7  EOSABS 0.0  BASOSABS 0.0   ------------------------------------------------------------------------------------------------------------------  Chemistries   Recent Labs Lab 03/07/17 1756  NA 139  K 5.3*  CL 104  CO2 23  GLUCOSE 148*  BUN 52*  CREATININE 1.31*  CALCIUM 9.0   ------------------------------------------------------------------------------------------------------------------  --------------------------------------------------------------------------------------Cardiac Enzymes:  Recent Labs Lab 03/07/17 1825  TROPONINI <0.03     --------------------------------------------------------------------------------------------------------------- Urine analysis:    Component Value Date/Time   COLORURINE YELLOW 03/07/2017 [place] 03/07/2017 1840   LABSPEC 1.011 03/07/2017 1840   PHURINE 5.0 03/07/2017 1840   GLUCOSEU NEGATIVE 03/07/2017 1840   HGBUR NEGATIVE 03/07/2017 1840   BILIRUBINUR NEGATIVE 03/07/2017 1840   KETONESUR NEGATIVE 03/07/2017 1840   PROTEINUR NEGATIVE 03/07/2017 1840   UROBILINOGEN 0.2 04/19/2014 1730   NITRITE NEGATIVE 03/07/2017 1840   LEUKOCYTESUR NEGATIVE 03/07/2017 1840      Imaging Results:    Ct Head Wo Contrast  Result Date: 03/07/2017 CLINICAL DATA:  Altered mental status EXAM: CT HEAD WITHOUT CONTRAST TECHNIQUE: Contiguous axial images were obtained from the base of the skull through the vertex without intravenous contrast. COMPARISON:  02/17/2017 FINDINGS: Brain: Diffuse atrophic changes are identified. Mild chronic white matter ischemic change is seen and stable. No findings to suggest acute hemorrhage, acute infarction or space-occupying mass lesion are noted. Vascular: No hyperdense vessel or unexpected calcification. Skull: Normal. Negative for fracture or focal lesion. Sinuses/Orbits: No acute finding. Other: None. IMPRESSION: Atrophic change and chronic white matter ischemic change similar to that seen on prior exam No acute abnormality noted. Electronically Signed   By: Inez Catalina M.D.   On: 03/07/2017 19:02   Dg Chest Port 1 View  Result Date: 03/07/2017 CLINICAL DATA:  Generalized weakness. EXAM: PORTABLE CHEST 1 VIEW COMPARISON:  Radiograph of February 19, 2017. FINDINGS: Stable cardiomegaly and central pulmonary vascular congestion. Atherosclerosis of thoracic aorta is noted. No pneumothorax is noted. Bilateral perihilar and basilar opacities are noted concerning for edema and mild bilateral pleural effusions. Bony thorax is unremarkable. IMPRESSION:  Cardiomegaly and central pulmonary vascular congestion and bilateral pulmonary edema and effusions consistent with congestive heart failure. Aortic atherosclerosis. Electronically Signed   By: Sabino Dick  Brooke Bonito, M.D.   On: 03/07/2017 18:17    My personal review of EKG: Rhythm - Atrial fibrillation   Assessment & Plan:    Active Problems:   Type 2 diabetes mellitus (HCC)   Chronic anticoagulation   Lewy body dementia without behavioral disturbance   Permanent atrial fibrillation (HCC)   CAD in native artery   AKI (acute kidney injury) ([place])   Cellulitis   1. Acute kidney injury- secondary to dehydration from diuresis. Patient's present creatinine as of 02/19/2017 was 0.87. Today creatinine is 1.31. Will hold by mouth Lasix and start gentle IV hydration with normal saline at 50 mL per hour for 12 hours. Follow BMP in a.m. 2. Cellulitis- patient has mild cellulitis of left lower extremity, start ceftriaxone 1 g IV daily. 3. Lethargy/altered mental status- likely from above, started on IV antibiotics and IV fluids. We'll closely monitor. 4. Chronic diastolic CHF- patient's chest x-ray again shows mild pulmonary edema, she is currently on oxygen via nasal cannula. Started on gentle IV hydration as above. Might have to restart Lasix at a lower dose at the time of discharge. 5. Atrial fibrillation-chronic, heart rate is controlled. Continue chronic anticoagulation with eliquis. Continue Bystolic, Cardizem 6. Diabetes mellitus-hold metformin, start sliding scale insulin with NovoLog. 7. Hyperkalemia- potassium is 5.3, started on IV normal saline. Hold potassium supplementation. Follow BMP and EM. 8. Hypertension-hold ACE inhibitor, continue Cardizem and Zebeta 9. Coronary artery disease- troponin is negative in the ED, continue Imdur. 10. Hypothyroidism-continue Synthroid 11. Goals of care discussion-patient's son is agreeable for palliative care consult. Might need to go back to skilled facility  with palliative care follow-up.   DVT Prophylaxis-   Patient on Eliquis  AM Labs Ordered, also please review Full Orders  Family Communication: Admission, patients condition and plan of care including tests being ordered have been discussed with the patient's son at  bedside* who indicate understanding and agree with the plan and Code Status.  Code Status:  Limited code, DO NOT INTUBATE  Admission status: Observation  Time spent in minutes : 60 minutes   Sybil Shrader S M.D on 03/07/2017 at 8:13 PM  Between 7am to 7pm - Pager - [phone]. After 7pm go to [email or web address] - password [facility]  [facility] - Office  [phone]

## 2017-03-07 NOTE — ED Triage Notes (Signed)
Per EMS: Pt from [place], pt lkw 1400, normally alert and walking around unit.  PT had sudden onset of generalized weakness. Stroke scale negative for EMS. Pt is confused per EMS. 162 cbg, vitals WNL

## 2017-03-07 NOTE — ED Provider Notes (Signed)
[facility] DEPT Provider Note   CSN: [id number] Arrival date & time: 03/07/17  1630     History   Chief Complaint Chief Complaint  Patient presents with  . Altered Mental Status    HPI [name] is a 83 y.o. female.  Level V caveat for altered mental status. Patient is bedridden and lives in the local nursing home. She normally is reasonably alert and interactive. Today around noon, patient became more lethargic and less interactive. She apparently half of a waffle at lunch. Patient was admitted to the hospital on 02/17/17 with acute on chronic heart failure.      Past Medical History:  Diagnosis Date  . A-fib ([place])   . Arteriosclerotic cardiovascular disease (ASCVD)    a. Cath 12/2002 showed high grade LAD stenosis treated with DES, also had residual CAD treated medically at that time with 50% + 70-80% mid-septal perforator, 80% D1, 70% OM1, 50% ostial RCA, 25% mRCA.  . Bronchitis    history  . Carcinoma of breast ([place])    left masectomy in 1995  . CHF (congestive heart failure) ([place])   . Chronic diastolic heart failure ([place]) 11/21/2009  . Chronic hoarseness   . CKD (chronic kidney disease), stage II    per historical labs for age, weight, Cr  . CVD (cerebrovascular disease)    plaque w/o focal disease in 2006; h/o CVA  . Dementia   . Dementia   . Diabetes mellitus type II    no insulin   . Edema   . Herpes zoster   . Hyperlipidemia   . Hypertension   . Hyponatremia   . Hypothyroid   . Lower extremity edema 08/20/2011  . Malignant neoplasm of breast (female) ([place])   . MVP (mitral valve prolapse)    moderate; with moderate MR  . Osteoporosis   . Peripheral neuropathy   . Permanent atrial fibrillation ([place])   . PVD (peripheral vascular disease) (H[facility])    ABIs of 0.64 and 0.59, right and left leg in 2009  . PVD (peripheral vascular disease) ([place])   . 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 Shingles   . Ulcer    left lower leg  . Varicose veins of legs 08/20/2011     Patient Active Problem List   Diagnosis Date Noted  . Pressure injury of skin 02/18/2017  . Acute diastolic CHF (congestive heart failure) ([place]) 02/18/2017  . Acute encephalopathy 02/18/2017  . Atrial fibrillation, rapid ([place]) 02/18/2017  . Acute on chronic diastolic congestive heart failure ([place]) 02/17/2017  . Permanent atrial fibrillation ([place]) 12/27/2016  . CAD in native artery 12/27/2016  . SIRS (systemic inflammatory response syndrome) ([place]) 12/10/2016  . Elevated troponin 12/10/2016  . SOB (shortness of breath)   . Lewy body dementia without behavioral disturbance   . Acute hypoxemic respiratory failure ([place]) 03/18/2016  . Hyponatremia 03/18/2016  . Abdominal distention 04/13/2014  . Hip pain 04/12/2014  . Labral tear of hip, degenerative 04/12/2014  . Trochanteric bursitis of right hip 04/12/2014  . Encounter for therapeutic drug monitoring 10/06/2013  . Lower extremity edema 08/20/2011  . Arteriosclerotic cardiovascular disease (ASCVD)   . Carcinoma of breast ([place])   . Atrial fibrillation with RVR ([place])   . MVP (mitral valve prolapse)   . PVD (peripheral vascular disease) ([place])   . CVD (cerebrovascular disease)   . Thyroid activity decreased   . Osteoporosis   . Chronic anticoagulation 11/22/2010  . Type 2 diabetes mellitus ([place]) 11/21/2009  .  Hyperlipidemia 11/21/2009  . Hypertension 11/21/2009  . Acute on chronic diastolic heart failure ([place]) 11/21/2009  . SPINAL STENOSIS, LUMBAR 09/05/2008    Past Surgical History:  Procedure Laterality Date  . ABDOMINAL HYSTERECTOMY     abd?  . CARDIAC SURGERY    . CATARACT EXTRACTION, BILATERAL    . CHOLECYSTECTOMY  2006  . COLONOSCOPY  Date unknown  . CORONARY ANGIOPLASTY WITH STENT PLACEMENT    . left masectomy  1995  . ORIF of left hip  05/22/06   Darylene Price History    No data available       Home Medications    Prior to Admission medications   Medication Sig Start Date End Date Taking? Authorizing  Provider  acetaminophen (TYLENOL) 325 MG tablet Take 650 mg by mouth 2 (two) times daily as needed for moderate pain or fever.     [provider]  apixaban (ELIQUIS) 2.5 MG TABS tablet Take 1 tablet (2.5 mg total) by mouth 2 (two) times daily. 12/11/16   Isaac Bliss, Rayford Halsted, MD  atorvastatin (LIPITOR) 20 MG tablet Take 20 mg by mouth daily.    [provider]  bisoprolol (ZEBETA) 5 MG tablet Take 5 mg by mouth daily.    [provider]  calcium carbonate (TUMS - DOSED IN MG ELEMENTAL CALCIUM) 500 MG chewable tablet Chew 1 tablet by mouth 3 (three) times daily before meals.    [provider]  cholecalciferol (VITAMIN D) 1000 units tablet Take 1,000 Units by mouth daily.    [provider]  diltiazem (CARDIZEM CD) 120 MG 24 hr capsule Take 1 capsule (120 mg total) by mouth daily. 09/30/16   Orvan Falconer, MD  furosemide (LASIX) 40 MG tablet Take 60 mg in am ( 1 1/2 tabs) and 40 mg in afternoon 01/30/17   Fay Records, MD  gabapentin (NEURONTIN) 100 MG capsule Take 200 mg by mouth every 8 (eight) hours.     [provider]  isosorbide mononitrate (IMDUR) 30 MG 24 hr tablet Take 30 mg by mouth daily.    [provider]  levalbuterol Penne Lash) 0.63 MG/3ML nebulizer solution Inhale 3 mLs into the lungs 3 (three) times daily as needed for wheezing or cough. Via nebulizer 12/17/16   [provider]  levothyroxine (SYNTHROID, LEVOTHROID) 25 MCG tablet Take 25 mcg by mouth every evening.     [provider]  lisinopril (PRINIVIL,ZESTRIL) 20 MG tablet Take 1 tablet (20 mg total) by mouth daily. 03/21/16   Barton Dubois, MD  metFORMIN (GLUCOPHAGE) 500 MG tablet Take 500 mg by mouth 2 (two) times daily with a meal.    [provider]  polyethylene glycol (MIRALAX / GLYCOLAX) packet Take 17 g by mouth daily.    [provider]  potassium chloride SA (K-DUR,KLOR-CON) 20 MEQ tablet Take 2 tablets (40 mEq total) by  mouth daily. 03/21/16   Barton Dubois, MD    Family History Family History  Problem Relation Age of Onset  . Diabetes Mother     Social History Social History  Substance Use Topics  . Smoking status: Never Smoker  . Smokeless tobacco: Never Used     Comment: tobacco use - no   . Alcohol use No     Allergies   Patient has no known allergies.   Review of Systems Review of Systems  Unable to perform ROS: Dementia     Physical Exam Updated Vital Signs BP 108/69  Pulse 99   Temp 98 F (36.7 C) (Rectal)   Resp 19   Ht 5\' 6"  (1.676 m)   Wt 52.6 kg (116 lb)   SpO2 100%   BMI 18.72 kg/m   Physical Exam  Constitutional:  Frail, will open her eyes when name called.  HENT:  Head: Normocephalic and atraumatic.  Eyes: Conjunctivae are normal.  Neck: Neck supple.  Cardiovascular:  Irregularly irregular  Pulmonary/Chest: Effort normal and breath sounds normal.  Abdominal: Soft. Bowel sounds are normal.  Musculoskeletal:  Unable  Neurological:  Unable  Skin: Skin is warm and dry.  Psychiatric:  Unable  Nursing note and vitals reviewed.    ED Treatments / Results  Labs (all labs ordered are listed, but only abnormal results are displayed) Labs Reviewed  CBC WITH DIFFERENTIAL/PLATELET - Abnormal; Notable for the following:       Result Value   RBC 5.77 (*)    HCT 47.4 (*)    RDW 19.4 (*)    All other components within normal limits  BASIC METABOLIC PANEL - Abnormal; Notable for the following:    Potassium 5.3 (*)    Glucose, Bld 148 (*)    BUN 52 (*)    Creatinine, Ser 1.31 (*)    GFR calc non Af Amer 34 (*)    GFR calc Af Amer 39 (*)    All other components within normal limits  URINALYSIS, ROUTINE W REFLEX MICROSCOPIC  TROPONIN I    EKG  EKG Interpretation  Date/Time:  Friday March 07 2017 16:39:44 EDT Ventricular Rate:  96 PR Interval:    QRS Duration: 130 QT Interval:  375 QTC Calculation: 472 R Axis:   100 Text Interpretation:  Atrial  fibrillation Ventricular premature complex Probable left ventricular hypertrophy Borderline abnrm T, anterolateral leads Confirmed by Nat Christen [phone]) on 03/07/2017 7:29:26 PM       Radiology Ct Head Wo Contrast  Result Date: 03/07/2017 CLINICAL DATA:  Altered mental status EXAM: CT HEAD WITHOUT CONTRAST TECHNIQUE: Contiguous axial images were obtained from the base of the skull through the vertex without intravenous contrast. COMPARISON:  02/17/2017 FINDINGS: Brain: Diffuse atrophic changes are identified. Mild chronic white matter ischemic change is seen and stable. No findings to suggest acute hemorrhage, acute infarction or space-occupying mass lesion are noted. Vascular: No hyperdense vessel or unexpected calcification. Skull: Normal. Negative for fracture or focal lesion. Sinuses/Orbits: No acute finding. Other: None. IMPRESSION: Atrophic change and chronic white matter ischemic change similar to that seen on prior exam No acute abnormality noted. Electronically Signed   By: Inez Catalina M.D.   On: 03/07/2017 19:02   Dg Chest Port 1 View  Result Date: 03/07/2017 CLINICAL DATA:  Generalized weakness. EXAM: PORTABLE CHEST 1 VIEW COMPARISON:  Radiograph of February 19, 2017. FINDINGS: Stable cardiomegaly and central pulmonary vascular congestion. Atherosclerosis of thoracic aorta is noted. No pneumothorax is noted. Bilateral perihilar and basilar opacities are noted concerning for edema and mild bilateral pleural effusions. Bony thorax is unremarkable. IMPRESSION: Cardiomegaly and central pulmonary vascular congestion and bilateral pulmonary edema and effusions consistent with congestive heart failure. Aortic atherosclerosis. Electronically Signed   By: Marijo Conception, M.D.   On: 03/07/2017 18:17    Procedures Procedures (including critical care time)  Medications Ordered in ED Medications  sodium chloride 0.9 % bolus 500 mL (0 mLs Intravenous Stopped 03/07/17 1909)     Initial Impression /  Assessment and Plan / ED Course  I have reviewed the  triage vital signs and the nursing notes.  Pertinent labs & imaging results that were available during my care of the patient were reviewed by me and considered in my medical decision making (see chart for details).     Patient is frail and feeble. Apparently, she has had a change in her mental status. I had a discussion with the son about CODE STATUS. He does not want intubation or electric shock applied.  Final Clinical Impressions(s) / ED Diagnoses   Final diagnoses:  Altered mental status, unspecified altered mental status type    New Prescriptions New Prescriptions   No medications on file     Nat Christen, MD 03/07/17 [phone]

## 2017-03-08 DIAGNOSIS — E119 Type 2 diabetes mellitus without complications: Secondary | ICD-10-CM | POA: Diagnosis not present

## 2017-03-08 DIAGNOSIS — L03116 Cellulitis of left lower limb: Secondary | ICD-10-CM

## 2017-03-08 DIAGNOSIS — F0280 Dementia in other diseases classified elsewhere without behavioral disturbance: Secondary | ICD-10-CM

## 2017-03-08 DIAGNOSIS — R4182 Altered mental status, unspecified: Secondary | ICD-10-CM

## 2017-03-08 DIAGNOSIS — G3183 Dementia with Lewy bodies: Secondary | ICD-10-CM

## 2017-03-08 DIAGNOSIS — I482 Chronic atrial fibrillation: Secondary | ICD-10-CM | POA: Diagnosis not present

## 2017-03-08 DIAGNOSIS — N179 Acute kidney failure, unspecified: Secondary | ICD-10-CM | POA: Diagnosis not present

## 2017-03-08 LAB — CBC
HEMATOCRIT: 44.9 % (ref 36.0–46.0)
HEMOGLOBIN: 13.9 g/dL (ref 12.0–15.0)
MCH: 25.9 pg — ABNORMAL LOW (ref 26.0–34.0)
MCHC: 31 g/dL (ref 30.0–36.0)
MCV: 83.6 fL (ref 78.0–100.0)
Platelets: 250 10*3/uL (ref 150–400)
RBC: 5.37 MIL/uL — AB (ref 3.87–5.11)
RDW: 19.5 % — ABNORMAL HIGH (ref 11.5–15.5)
WBC: 11.1 10*3/uL — AB (ref 4.0–10.5)

## 2017-03-08 LAB — GLUCOSE, CAPILLARY
GLUCOSE-CAPILLARY: 106 mg/dL — AB (ref 65–99)
Glucose-Capillary: 108 mg/dL — ABNORMAL HIGH (ref 65–99)
Glucose-Capillary: 114 mg/dL — ABNORMAL HIGH (ref 65–99)
Glucose-Capillary: 126 mg/dL — ABNORMAL HIGH (ref 65–99)
Glucose-Capillary: 89 mg/dL (ref 65–99)
Glucose-Capillary: 98 mg/dL (ref 65–99)

## 2017-03-08 LAB — COMPREHENSIVE METABOLIC PANEL
ALBUMIN: 2.9 g/dL — AB (ref 3.5–5.0)
ALT: 32 U/L (ref 14–54)
ANION GAP: 12 (ref 5–15)
AST: 32 U/L (ref 15–41)
Alkaline Phosphatase: 91 U/L (ref 38–126)
BUN: 47 mg/dL — ABNORMAL HIGH (ref 6–20)
CHLORIDE: 103 mmol/L (ref 101–111)
CO2: 24 mmol/L (ref 22–32)
CREATININE: 1.04 mg/dL — AB (ref 0.44–1.00)
Calcium: 8.7 mg/dL — ABNORMAL LOW (ref 8.9–10.3)
GFR calc non Af Amer: 45 mL/min — ABNORMAL LOW (ref >60)
GFR, EST AFRICAN AMERICAN: 52 mL/min — AB (ref >60)
Glucose, Bld: 95 mg/dL (ref 65–99)
Potassium: 4.7 mmol/L (ref 3.5–5.1)
SODIUM: 139 mmol/L (ref 135–145)
Total Bilirubin: 0.9 mg/dL (ref 0.3–1.2)
Total Protein: 5.9 g/dL — ABNORMAL LOW (ref 6.5–8.1)

## 2017-03-08 MED ORDER — ORAL CARE MOUTH RINSE
15.0000 mL | Freq: Two times a day (BID) | OROMUCOSAL | Status: DC
  Administered 2017-03-08 – 2017-03-09 (×3): 15 mL via OROMUCOSAL

## 2017-03-08 MED ORDER — SODIUM CHLORIDE 0.9 % IV SOLN
INTRAVENOUS | Status: AC
  Administered 2017-03-08: 21:00:00 via INTRAVENOUS

## 2017-03-08 NOTE — Progress Notes (Signed)
PROGRESS NOTE    [name]  DDU:[id number] DOB: [birth year] DOA: 03/07/2017 PCP: Hilbert Corrigan, MD    Brief Narrative:  83 year old female with a history of diastolic heart failure, hypertension and diabetes, atrial fibrillation, was brought to the hospital with lethargy and altered mental status. She was noted to be dehydrated and was admitted for IV hydration. There was also some concern for lower extremity cellulitis and she was started on intravenous antibiotics. Anticipate discharge back to nursing facility in the next 24 hours if she continues to improve.   Assessment & Plan:   Active Problems:   Type 2 diabetes mellitus (HCC)   Chronic anticoagulation   Lewy body dementia without behavioral disturbance   Permanent atrial fibrillation (HCC)   CAD in native artery   AKI (acute kidney injury) ([place])   Cellulitis   1. Acute kidney injury. Improving with IV hydration. Continue to hold Lasix for now. 2. Mild cellulitis of the left lower extremity. Continue on Rocephin for now. 3. Lethargy. Likely related to dehydration. Improved with IV fluids. 4. Chronic diastolic CHF. Appears compensated at this time. Restart Lasix in a.m . 5. Atrial fibrillation, chronic. Heart rate is controlled. Continue on anticoagulation 6. Diabetes. Hold metformin. Start on sliding scale insulin. Blood sugars have been stable. 7. Hyperkalemia. Improved with hydration. 8. Hypertension. ACE inhibitor currently on hold. Continue on Cardizem and bystolic 9. Hypothyroidism. Continue Synthroid   DVT prophylaxis: eliquis Code Status: Partial code, no intubation Family Communication: Discussed with son at the bedside Disposition Plan: Return to nursing facility on discharge.   Consultants:     Procedures:     Antimicrobials:  Rocephin 7/27>>    Subjective: Feeling better today. Feels less tired today. No shortness of breath.  Objective: Vitals:   03/07/17 2100 03/07/17 2106  03/08/17 0700 03/08/17 1705  BP:  119/84 114/80 109/65  Pulse: 80 64  76  Resp: 20 18  18   Temp:  97.8 F (36.6 C) 97.8 F (36.6 C) (!) 97.4 F (36.3 C)  TempSrc:  Oral Axillary Oral  SpO2: 95% 96% 96% 98%  Weight:  56 kg (123 lb 7.3 oz)    Height:        Intake/Output Summary (Last 24 hours) at 03/08/17 1814 Last data filed at 03/08/17 1746  Gross per 24 hour  Intake           276.66 ml  Output              500 ml  Net          -223.34 ml   Filed Weights   03/07/17 1638 03/07/17 2106  Weight: 52.6 kg (116 lb) 56 kg (123 lb 7.3 oz)    Examination:  General exam: Appears calm and comfortable  Respiratory system: Clear to auscultation. Respiratory effort normal. Cardiovascular system: S1 & S2 heard, RRR. No JVD, murmurs, rubs, gallops or clicks. No pedal edema. Gastrointestinal system: Abdomen is nondistended, soft and nontender. No organomegaly or masses felt. Normal bowel sounds heard. Central nervous system: Alert and oriented. No focal neurological deficits. Extremities: Symmetric 5 x 5 power. Skin: Venous stasis changes in lower extremities bilaterally Psychiatry: Judgement and insight appear normal. Mood & affect appropriate.     Data Reviewed: I have personally reviewed following labs and imaging studies  CBC:  Recent Labs Lab 03/07/17 1756 03/08/17 0559  WBC 8.0 11.1*  NEUTROABS 5.8  --   HGB 15.0 13.9  HCT 47.4* 44.9  MCV 82.1  83.6  PLT 253 188   Basic Metabolic Panel:  Recent Labs Lab 03/07/17 1756 03/08/17 0559  NA 139 139  K 5.3* 4.7  CL 104 103  CO2 23 24  GLUCOSE 148* 95  BUN 52* 47*  CREATININE 1.31* 1.04*  CALCIUM 9.0 8.7*   GFR: Estimated Creatinine Clearance: 29.2 mL/min (A) (by C-G formula based on SCr of 1.04 mg/dL (H)). Liver Function Tests:  Recent Labs Lab 03/08/17 0559  AST 32  ALT 32  ALKPHOS 91  BILITOT 0.9  PROT 5.9*  ALBUMIN 2.9*   No results for input(s): LIPASE, AMYLASE in the last 168 hours. No results  for input(s): AMMONIA in the last 168 hours. Coagulation Profile: No results for input(s): INR, PROTIME in the last 168 hours. Cardiac Enzymes:  Recent Labs Lab 03/07/17 1825  TROPONINI <0.03   BNP (last 3 results) No results for input(s): PROBNP in the last 8760 hours. HbA1C: No results for input(s): HGBA1C in the last 72 hours. CBG:  Recent Labs Lab 03/08/17 0020 03/08/17 0535 03/08/17 0755 03/08/17 1124 03/08/17 1654  GLUCAP 114* 98 106* 89 126*   Lipid Profile: No results for input(s): CHOL, HDL, LDLCALC, TRIG, CHOLHDL, LDLDIRECT in the last 72 hours. Thyroid Function Tests: No results for input(s): TSH, T4TOTAL, FREET4, T3FREE, THYROIDAB in the last 72 hours. Anemia Panel: No results for input(s): VITAMINB12, FOLATE, FERRITIN, TIBC, IRON, RETICCTPCT in the last 72 hours. Sepsis Labs: No results for input(s): PROCALCITON, LATICACIDVEN in the last 168 hours.  No results found for this or any previous visit (from the past 240 hour(s)).       Radiology Studies: Ct Head Wo Contrast  Result Date: 03/07/2017 CLINICAL DATA:  Altered mental status EXAM: CT HEAD WITHOUT CONTRAST TECHNIQUE: Contiguous axial images were obtained from the base of the skull through the vertex without intravenous contrast. COMPARISON:  02/17/2017 FINDINGS: Brain: Diffuse atrophic changes are identified. Mild chronic white matter ischemic change is seen and stable. No findings to suggest acute hemorrhage, acute infarction or space-occupying mass lesion are noted. Vascular: No hyperdense vessel or unexpected calcification. Skull: Normal. Negative for fracture or focal lesion. Sinuses/Orbits: No acute finding. Other: None. IMPRESSION: Atrophic change and chronic white matter ischemic change similar to that seen on prior exam No acute abnormality noted. Electronically Signed   By: Inez Catalina M.D.   On: 03/07/2017 19:02   Dg Chest Port 1 View  Result Date: 03/07/2017 CLINICAL DATA:  Generalized  weakness. EXAM: PORTABLE CHEST 1 VIEW COMPARISON:  Radiograph of February 19, 2017. FINDINGS: Stable cardiomegaly and central pulmonary vascular congestion. Atherosclerosis of thoracic aorta is noted. No pneumothorax is noted. Bilateral perihilar and basilar opacities are noted concerning for edema and mild bilateral pleural effusions. Bony thorax is unremarkable. IMPRESSION: Cardiomegaly and central pulmonary vascular congestion and bilateral pulmonary edema and effusions consistent with congestive heart failure. Aortic atherosclerosis. Electronically Signed   By: Marijo Conception, M.D.   On: 03/07/2017 18:17        Scheduled Meds: . apixaban  2.5 mg Oral BID  . atorvastatin  20 mg Oral Daily  . bisoprolol  5 mg Oral Daily  . diltiazem  120 mg Oral Daily  . gabapentin  200 mg Oral Q8H  . insulin aspart  0-9 Units Subcutaneous TID WC  . isosorbide mononitrate  30 mg Oral Daily  . levothyroxine  25 mcg Oral QPM  . mouth rinse  15 mL Mouth Rinse BID  . polyethylene glycol  17  g Oral Daily   Continuous Infusions: . sodium chloride 50 mL/hr at 03/08/17 1311  . cefTRIAXone (ROCEPHIN)  IV Stopped (03/08/17 0238)     LOS: 0 days    Time spent: 3mins    MEMON,JEHANZEB, MD [facility] Pager [phone]  If 7PM-7AM, please contact night-coverage [email or web address] Password [facility] 03/08/2017, 6:14 PM

## 2017-03-09 DIAGNOSIS — I482 Chronic atrial fibrillation: Secondary | ICD-10-CM | POA: Diagnosis not present

## 2017-03-09 DIAGNOSIS — G3183 Dementia with Lewy bodies: Secondary | ICD-10-CM | POA: Diagnosis not present

## 2017-03-09 DIAGNOSIS — R4182 Altered mental status, unspecified: Secondary | ICD-10-CM | POA: Diagnosis not present

## 2017-03-09 DIAGNOSIS — N179 Acute kidney failure, unspecified: Secondary | ICD-10-CM | POA: Diagnosis not present

## 2017-03-09 DIAGNOSIS — L03116 Cellulitis of left lower limb: Secondary | ICD-10-CM | POA: Diagnosis not present

## 2017-03-09 LAB — CBC
HCT: 43.8 % (ref 36.0–46.0)
Hemoglobin: 13.4 g/dL (ref 12.0–15.0)
MCH: 25.8 pg — AB (ref 26.0–34.0)
MCHC: 30.6 g/dL (ref 30.0–36.0)
MCV: 84.2 fL (ref 78.0–100.0)
PLATELETS: 236 10*3/uL (ref 150–400)
RBC: 5.2 MIL/uL — ABNORMAL HIGH (ref 3.87–5.11)
RDW: 19.4 % — AB (ref 11.5–15.5)
WBC: 7.1 10*3/uL (ref 4.0–10.5)

## 2017-03-09 LAB — BASIC METABOLIC PANEL
Anion gap: 10 (ref 5–15)
BUN: 38 mg/dL — AB (ref 6–20)
CALCIUM: 8.4 mg/dL — AB (ref 8.9–10.3)
CHLORIDE: 107 mmol/L (ref 101–111)
CO2: 26 mmol/L (ref 22–32)
CREATININE: 0.79 mg/dL (ref 0.44–1.00)
GFR calc non Af Amer: >60 mL/min (ref >60)
Glucose, Bld: 76 mg/dL (ref 65–99)
Potassium: 3.9 mmol/L (ref 3.5–5.1)
SODIUM: 143 mmol/L (ref 135–145)

## 2017-03-09 LAB — GLUCOSE, CAPILLARY
GLUCOSE-CAPILLARY: 81 mg/dL (ref 65–99)
Glucose-Capillary: 160 mg/dL — ABNORMAL HIGH (ref 65–99)
Glucose-Capillary: 107 mg/dL — ABNORMAL HIGH (ref 65–99)

## 2017-03-09 LAB — HEMOGLOBIN A1C
Hgb A1c MFr Bld: 7.2 % — ABNORMAL HIGH (ref 4.8–5.6)
Mean Plasma Glucose: 160 mg/dL

## 2017-03-09 MED ORDER — FUROSEMIDE 40 MG PO TABS: 40.0000 mg | ORAL_TABLET | Freq: Every day | ORAL | 11 refills | Status: DC

## 2017-03-09 NOTE — Clinical Social Work Note (Addendum)
CSW notified pt to DC back to [facility] today.  CSW contacted facility to advise, spoke to [place], ok for pt to come back today. DC Summary to be faxed to [phone] when available.  DC Summary faxed to [email or web address] [phone].  Amaya Blakeman B. Joline Maxcy Clinical Social Work Dept Weekend Social Worker [phone] 12:47 PM

## 2017-03-09 NOTE — Discharge Summary (Signed)
Physician Discharge Summary  [name] OJJ:[id number] DOB: 12-31-1920 DOA: 03/07/2017  PCP: Hilbert Corrigan, MD  Admit date: 03/07/2017 Discharge date: 03/09/2017  Admitted From: SNF Disposition:  SNF  Recommendations for Outpatient Follow-up:  1. Follow up with PCP in 1-2 weeks 2. Please obtain BMP/CBC in one week 3. Restart lasix on 03/12/17 4. She will be discharged on oxygen 2L 5. ACE discontinued due to borderline blood pressures  Discharge Condition:stable CODE STATUS: DNR Diet recommendation: Heart Healthy / Carb Modified   Brief/Interim Summary: 83 year old female with a history of diastolic heart failure, hypertension and diabetes, atrial fibrillation, was brought to the hospital with lethargy and altered mental status. She was noted to be dehydrated and was admitted for IV hydration. There was also some concern for lower extremity cellulitis and she was started on intravenous antibiotics.   Discharge Diagnoses:  Active Problems:   Type 2 diabetes mellitus (HCC)   Chronic anticoagulation   Lewy body dementia without behavioral disturbance   Permanent atrial fibrillation (HCC)   CAD in native artery   AKI (acute kidney injury) ([place])   Cellulitis  1. Acute kidney injury. Improved with IV hydration. Lasix will be resumed at lower dose. 2. Mild cellulitis of the left lower extremity. Treated with rocephin, but I suspect this may be related more to venous stasis. She does not have any leukocytosis or fever. Will not prescribe further antibiotics. 3. Lethargy. Likely related to dehydration. Improved with IV fluids. 4. Chronic diastolic CHF. Appears compensated at this time. Restart Lasix on 8/1. She will be discharged on oxygen 2L 5. Atrial fibrillation, chronic. Heart rate is controlled. Continue on BB and cardizem. Continue on anticoagulation 6. Diabetes. Restart metformin on discharge. Blood sugars have been stable. 7. Hyperkalemia. Improved with  hydration. 8. Hypertension. ACE inhibitor currently on hold. Continue on Cardizem and bystolic 9. Hypothyroidism. Continue Synthroid 10. Goals of care. After discussing with patient and her son, they have agreed to DNR status.  Discharge Instructions  Discharge Instructions    Diet - low sodium heart healthy    Complete by:  As directed    Increase activity slowly    Complete by:  As directed      Allergies as of 03/09/2017   No Known Allergies     Medication List    STOP taking these medications   lisinopril 20 MG tablet Commonly known as:  PRINIVIL,ZESTRIL   sodium polystyrene 15 GM/60ML suspension Commonly known as:  KAYEXALATE     TAKE these medications   acetaminophen 325 MG tablet Commonly known as:  TYLENOL Take 650 mg by mouth 2 (two) times daily as needed for moderate pain or fever.   apixaban 2.5 MG Tabs tablet Commonly known as:  ELIQUIS Take 1 tablet (2.5 mg total) by mouth 2 (two) times daily.   atorvastatin 20 MG tablet Commonly known as:  LIPITOR Take 20 mg by mouth daily.   bisoprolol 5 MG tablet Commonly known as:  ZEBETA Take 5 mg by mouth daily.   calcium carbonate 500 MG chewable tablet Commonly known as:  TUMS - dosed in mg elemental calcium Chew 1 tablet by mouth 3 (three) times daily before meals.   cholecalciferol 1000 units tablet Commonly known as:  VITAMIN D Take 1,000 Units by mouth daily.   diltiazem 120 MG tablet Commonly known as:  CARDIZEM Take 1 tablet by mouth daily.   furosemide 40 MG tablet Commonly known as:  LASIX Take 1 tablet (40 mg total)  by mouth daily. Start on 03/12/17   gabapentin 100 MG capsule Commonly known as:  NEURONTIN Take 200 mg by mouth every 8 (eight) hours.   isosorbide mononitrate 30 MG 24 hr tablet Commonly known as:  IMDUR Take 30 mg by mouth daily.   levalbuterol 0.63 MG/3ML nebulizer solution Commonly known as:  XOPENEX Inhale 3 mLs into the lungs 3 (three) times daily as needed for  wheezing or cough. Via nebulizer   levothyroxine 25 MCG tablet Commonly known as:  SYNTHROID, LEVOTHROID Take 25 mcg by mouth every evening.   metFORMIN 500 MG tablet Commonly known as:  GLUCOPHAGE Take 500 mg by mouth 2 (two) times daily with a meal.   ondansetron 4 MG tablet Commonly known as:  ZOFRAN Take 1 mg by mouth every 6 (six) hours as needed for nausea or vomiting.   polyethylene glycol packet Commonly known as:  MIRALAX / GLYCOLAX Take 17 g by mouth daily.       No Known Allergies  Consultations:     Procedures/Studies: Ct Head Wo Contrast  Result Date: 03/07/2017 CLINICAL DATA:  Altered mental status EXAM: CT HEAD WITHOUT CONTRAST TECHNIQUE: Contiguous axial images were obtained from the base of the skull through the vertex without intravenous contrast. COMPARISON:  02/17/2017 FINDINGS: Brain: Diffuse atrophic changes are identified. Mild chronic white matter ischemic change is seen and stable. No findings to suggest acute hemorrhage, acute infarction or space-occupying mass lesion are noted. Vascular: No hyperdense vessel or unexpected calcification. Skull: Normal. Negative for fracture or focal lesion. Sinuses/Orbits: No acute finding. Other: None. IMPRESSION: Atrophic change and chronic white matter ischemic change similar to that seen on prior exam No acute abnormality noted. Electronically Signed   By: Inez Catalina M.D.   On: 03/07/2017 19:02   Ct Head Wo Contrast  Result Date: 02/17/2017 CLINICAL DATA:  82 y/o  F; increased confusion. EXAM: CT HEAD WITHOUT CONTRAST TECHNIQUE: Contiguous axial images were obtained from the base of the skull through the vertex without intravenous contrast. COMPARISON:  04/19/2014 CT head. FINDINGS: Brain: No evidence of acute infarction, hemorrhage, hydrocephalus, extra-axial collection or mass lesion/mass effect. Stable advanced brain parenchymal volume loss and mild chronic microvascular ischemic changes. Vascular: Calcific  atherosclerosis of carotid siphons and vertebral arteries. Skull: Normal. Negative for fracture or focal lesion. Sinuses/Orbits: No acute finding. Other: None. IMPRESSION: 1. No acute intracranial abnormality identified. 2. Stable mild chronic microvascular ischemic changes and advanced parenchymal volume loss of the brain. Electronically Signed   By: Kristine Garbe M.D.   On: 02/17/2017 14:16   Dg Chest Port 1 View  Result Date: 03/07/2017 CLINICAL DATA:  Generalized weakness. EXAM: PORTABLE CHEST 1 VIEW COMPARISON:  Radiograph of February 19, 2017. FINDINGS: Stable cardiomegaly and central pulmonary vascular congestion. Atherosclerosis of thoracic aorta is noted. No pneumothorax is noted. Bilateral perihilar and basilar opacities are noted concerning for edema and mild bilateral pleural effusions. Bony thorax is unremarkable. IMPRESSION: Cardiomegaly and central pulmonary vascular congestion and bilateral pulmonary edema and effusions consistent with congestive heart failure. Aortic atherosclerosis. Electronically Signed   By: Marijo Conception, M.D.   On: 03/07/2017 18:17   Dg Chest Port 1 View  Result Date: 02/19/2017 CLINICAL DATA:  Right pleural effusion. EXAM: PORTABLE CHEST 1 VIEW COMPARISON:  Radiograph February 18, 2017. FINDINGS: Stable cardiomegaly with central pulmonary vascular congestion. Atherosclerosis thoracic aorta is noted. No pneumothorax is noted. Mild bilateral pleural effusions are noted which may be improved compared to prior exam. Bibasilar opacities  are noted and improved suggesting improving edema. Bony thorax is unremarkable. IMPRESSION: Findings consistent with mildly improving pulmonary edema and effusions. Aortic atherosclerosis. Electronically Signed   By: Marijo Conception, M.D.   On: 02/19/2017 07:41   Portable Chest 1 View  Result Date: 02/18/2017 CLINICAL DATA:  Acute on chronic diastolic congestive heart failure. EXAM: PORTABLE CHEST 1 VIEW COMPARISON:  02/17/2017 and  12/10/2016 FINDINGS: Increased bilateral pulmonary edema with increased right pleural effusion at least partially loculated laterally. Increased density at the left base consistent with an effusion. Chronic cardiomegaly. IMPRESSION: Worsening congestive heart failure. Electronically Signed   By: Lorriane Shire M.D.   On: 02/18/2017 09:22   Dg Chest Port 1 View  Result Date: 02/17/2017 CLINICAL DATA:  Hypotension.  Shortness of breath EXAM: PORTABLE CHEST 1 VIEW COMPARISON:  12/10/2016 FINDINGS: Marked cardiopericardial enlargement. There is increase in diffuse interstitial and airspace opacity with right-sided pleural effusion that could be loculated. No pneumothorax. IMPRESSION: CHF pattern. Electronically Signed   By: Monte Fantasia M.D.   On: 02/17/2017 10:21     Subjective: Patient is feeling weak. Has some pain in legs bilaterally  Discharge Exam: Vitals:   03/09/17 0359 03/09/17 1300  BP: 108/69 (!) 101/55  Pulse: 87 93  Resp: 18 16  Temp: 98.2 F (36.8 C) 97.9 F (36.6 C)   Vitals:   03/08/17 2019 03/08/17 2229 03/09/17 0359 03/09/17 1300  BP: 126/82  108/69 (!) 101/55  Pulse:   87 93  Resp: 18  18 16   Temp: 98 F (36.7 C)  98.2 F (36.8 C) 97.9 F (36.6 C)  TempSrc: Oral  Oral Oral  SpO2: 100% 95% 100% 100%  Weight:      Height:        General: Pt is alert, awake, not in acute distress Cardiovascular: RRR, S1/S2 +, no rubs, no gallops Respiratory: CTA bilaterally, no wheezing, no rhonchi Abdominal: Soft, NT, ND, bowel sounds + Extremities: no edema, no cyanosis    The results of significant diagnostics from this hospitalization (including imaging, microbiology, ancillary and laboratory) are listed below for reference.     Microbiology: No results found for this or any previous visit (from the past 240 hour(s)).   Labs: BNP (last 3 results)  Recent Labs  09/27/16 1935 12/19/16 1449 02/17/17 0950  BNP 523.0* 1,286.0* 2,876.8*   Basic Metabolic  Panel:  Recent Labs Lab 03/07/17 1756 03/08/17 0559 03/09/17 0553  NA 139 139 143  K 5.3* 4.7 3.9  CL 104 103 107  CO2 23 24 26   GLUCOSE 148* 95 76  BUN 52* 47* 38*  CREATININE 1.31* 1.04* 0.79  CALCIUM 9.0 8.7* 8.4*   Liver Function Tests:  Recent Labs Lab 03/08/17 0559  AST 32  ALT 32  ALKPHOS 91  BILITOT 0.9  PROT 5.9*  ALBUMIN 2.9*   No results for input(s): LIPASE, AMYLASE in the last 168 hours. No results for input(s): AMMONIA in the last 168 hours. CBC:  Recent Labs Lab 03/07/17 1756 03/08/17 0559 03/09/17 0553  WBC 8.0 11.1* 7.1  NEUTROABS 5.8  --   --   HGB 15.0 13.9 13.4  HCT 47.4* 44.9 43.8  MCV 82.1 83.6 84.2  PLT 253 250 236   Cardiac Enzymes:  Recent Labs Lab 03/07/17 1825  TROPONINI <0.03   BNP: Invalid input(s): POCBNP CBG:  Recent Labs Lab 03/08/17 1124 03/08/17 1654 03/08/17 2013 03/09/17 0739 03/09/17 1118  GLUCAP 89 126* 108* 81 160*   D-Dimer  No results for input(s): DDIMER in the last 72 hours. Hgb A1c  Recent Labs  03/08/17 0559  HGBA1C 7.2*   Lipid Profile No results for input(s): CHOL, HDL, LDLCALC, TRIG, CHOLHDL, LDLDIRECT in the last 72 hours. Thyroid function studies No results for input(s): TSH, T4TOTAL, T3FREE, THYROIDAB in the last 72 hours.  Invalid input(s): FREET3 Anemia work up No results for input(s): VITAMINB12, FOLATE, FERRITIN, TIBC, IRON, RETICCTPCT in the last 72 hours. Urinalysis    Component Value Date/Time   COLORURINE YELLOW 03/07/2017 [place] 03/07/2017 1840   LABSPEC 1.011 03/07/2017 1840   PHURINE 5.0 03/07/2017 1840   GLUCOSEU NEGATIVE 03/07/2017 1840   HGBUR NEGATIVE 03/07/2017 1840   BILIRUBINUR NEGATIVE 03/07/2017 1840   KETONESUR NEGATIVE 03/07/2017 1840   PROTEINUR NEGATIVE 03/07/2017 1840   UROBILINOGEN 0.2 04/19/2014 1730   NITRITE NEGATIVE 03/07/2017 1840   LEUKOCYTESUR NEGATIVE 03/07/2017 1840   Sepsis Labs Invalid input(s): PROCALCITONIN,  WBC,   LACTICIDVEN Microbiology No results found for this or any previous visit (from the past 240 hour(s)).   Time coordinating discharge: Over 30 minutes  SIGNED:   Kathie Dike, MD  [facility] 03/09/2017, 2:24 PM Pager   If 7PM-7AM, please contact night-coverage [email or web address] Password [email or web address]

## 2017-03-09 NOTE — Progress Notes (Signed)
Pt discharged back to [facility] today per Dr. Roderic Palau. Pt's IV site D/C'd and WDL. Pt's VSS. Report called to [place] at [facility]. Verbalized understanding. Family updated at bedside. EMS called. Currently awaiting EMS arrival. Pt stable.

## 2017-05-05 ENCOUNTER — Emergency Department (HOSPITAL_COMMUNITY): Payer: Medicare Other

## 2017-05-05 ENCOUNTER — Encounter (HOSPITAL_COMMUNITY): Payer: Self-pay

## 2017-05-05 ENCOUNTER — Inpatient Hospital Stay (HOSPITAL_COMMUNITY)
Admission: EM | Admit: 2017-05-05 | Discharge: 2017-05-19 | DRG: 291 | Disposition: A | Discharge status: Skilled Nursing Facility | Payer: Medicare Other | Attending: Internal Medicine | Admitting: Internal Medicine

## 2017-05-05 ENCOUNTER — Encounter: Payer: Self-pay | Admitting: Adult Health

## 2017-05-05 ENCOUNTER — Ambulatory Visit (INDEPENDENT_AMBULATORY_CARE_PROVIDER_SITE_OTHER): Payer: Medicare Other | Admitting: Adult Health

## 2017-05-05 VITALS — BP 120/76 | HR 98 | Ht 66.0 in | Wt 121.0 lb

## 2017-05-05 DIAGNOSIS — F0280 Dementia in other diseases classified elsewhere without behavioral disturbance: Secondary | ICD-10-CM | POA: Diagnosis present

## 2017-05-05 DIAGNOSIS — E1122 Type 2 diabetes mellitus with diabetic chronic kidney disease: Secondary | ICD-10-CM | POA: Diagnosis present

## 2017-05-05 DIAGNOSIS — M48061 Spinal stenosis, lumbar region without neurogenic claudication: Secondary | ICD-10-CM | POA: Diagnosis present

## 2017-05-05 DIAGNOSIS — E039 Hypothyroidism, unspecified: Secondary | ICD-10-CM | POA: Diagnosis present

## 2017-05-05 DIAGNOSIS — G3183 Dementia with Lewy bodies: Secondary | ICD-10-CM | POA: Diagnosis present

## 2017-05-05 DIAGNOSIS — Z7901 Long term (current) use of anticoagulants: Secondary | ICD-10-CM | POA: Diagnosis not present

## 2017-05-05 DIAGNOSIS — R601 Generalized edema: Secondary | ICD-10-CM | POA: Diagnosis present

## 2017-05-05 DIAGNOSIS — R680 Hypothermia, not associated with low environmental temperature: Secondary | ICD-10-CM | POA: Diagnosis not present

## 2017-05-05 DIAGNOSIS — E876 Hypokalemia: Secondary | ICD-10-CM | POA: Diagnosis present

## 2017-05-05 DIAGNOSIS — I5032 Chronic diastolic (congestive) heart failure: Secondary | ICD-10-CM

## 2017-05-05 DIAGNOSIS — I482 Chronic atrial fibrillation: Secondary | ICD-10-CM | POA: Diagnosis present

## 2017-05-05 DIAGNOSIS — E871 Hypo-osmolality and hyponatremia: Secondary | ICD-10-CM | POA: Diagnosis present

## 2017-05-05 DIAGNOSIS — Z9012 Acquired absence of left breast and nipple: Secondary | ICD-10-CM

## 2017-05-05 DIAGNOSIS — Z955 Presence of coronary angioplasty implant and graft: Secondary | ICD-10-CM

## 2017-05-05 DIAGNOSIS — E785 Hyperlipidemia, unspecified: Secondary | ICD-10-CM | POA: Diagnosis not present

## 2017-05-05 DIAGNOSIS — I2510 Atherosclerotic heart disease of native coronary artery without angina pectoris: Secondary | ICD-10-CM

## 2017-05-05 DIAGNOSIS — I9589 Other hypotension: Secondary | ICD-10-CM | POA: Diagnosis not present

## 2017-05-05 DIAGNOSIS — E43 Unspecified severe protein-calorie malnutrition: Secondary | ICD-10-CM | POA: Diagnosis present

## 2017-05-05 DIAGNOSIS — I878 Other specified disorders of veins: Secondary | ICD-10-CM | POA: Diagnosis present

## 2017-05-05 DIAGNOSIS — E784 Other hyperlipidemia: Secondary | ICD-10-CM | POA: Diagnosis not present

## 2017-05-05 DIAGNOSIS — I130 Hypertensive heart and chronic kidney disease with heart failure and stage 1 through stage 4 chronic kidney disease, or unspecified chronic kidney disease: Secondary | ICD-10-CM | POA: Diagnosis present

## 2017-05-05 DIAGNOSIS — I2781 Cor pulmonale (chronic): Secondary | ICD-10-CM | POA: Diagnosis present

## 2017-05-05 DIAGNOSIS — R600 Localized edema: Secondary | ICD-10-CM | POA: Diagnosis present

## 2017-05-05 DIAGNOSIS — Z66 Do not resuscitate: Secondary | ICD-10-CM | POA: Diagnosis present

## 2017-05-05 DIAGNOSIS — I4821 Permanent atrial fibrillation: Secondary | ICD-10-CM | POA: Diagnosis present

## 2017-05-05 DIAGNOSIS — R609 Edema, unspecified: Secondary | ICD-10-CM

## 2017-05-05 DIAGNOSIS — Z7189 Other specified counseling: Secondary | ICD-10-CM | POA: Diagnosis not present

## 2017-05-05 DIAGNOSIS — M79606 Pain in leg, unspecified: Secondary | ICD-10-CM | POA: Diagnosis not present

## 2017-05-05 DIAGNOSIS — I472 Ventricular tachycardia: Secondary | ICD-10-CM | POA: Diagnosis not present

## 2017-05-05 DIAGNOSIS — E11649 Type 2 diabetes mellitus with hypoglycemia without coma: Secondary | ICD-10-CM | POA: Diagnosis present

## 2017-05-05 DIAGNOSIS — Z6822 Body mass index (BMI) 22.0-22.9, adult: Secondary | ICD-10-CM | POA: Diagnosis not present

## 2017-05-05 DIAGNOSIS — Z9981 Dependence on supplemental oxygen: Secondary | ICD-10-CM

## 2017-05-05 DIAGNOSIS — I495 Sick sinus syndrome: Secondary | ICD-10-CM | POA: Diagnosis present

## 2017-05-05 DIAGNOSIS — E7800 Pure hypercholesterolemia, unspecified: Secondary | ICD-10-CM

## 2017-05-05 DIAGNOSIS — Z79899 Other long term (current) drug therapy: Secondary | ICD-10-CM

## 2017-05-05 DIAGNOSIS — Z853 Personal history of malignant neoplasm of breast: Secondary | ICD-10-CM

## 2017-05-05 DIAGNOSIS — Z515 Encounter for palliative care: Secondary | ICD-10-CM | POA: Diagnosis not present

## 2017-05-05 DIAGNOSIS — R109 Unspecified abdominal pain: Secondary | ICD-10-CM

## 2017-05-05 DIAGNOSIS — I10 Essential (primary) hypertension: Secondary | ICD-10-CM

## 2017-05-05 DIAGNOSIS — I509 Heart failure, unspecified: Secondary | ICD-10-CM | POA: Diagnosis not present

## 2017-05-05 DIAGNOSIS — E1151 Type 2 diabetes mellitus with diabetic peripheral angiopathy without gangrene: Secondary | ICD-10-CM | POA: Diagnosis present

## 2017-05-05 DIAGNOSIS — I481 Persistent atrial fibrillation: Secondary | ICD-10-CM | POA: Diagnosis not present

## 2017-05-05 DIAGNOSIS — I959 Hypotension, unspecified: Secondary | ICD-10-CM | POA: Diagnosis not present

## 2017-05-05 DIAGNOSIS — M79604 Pain in right leg: Secondary | ICD-10-CM

## 2017-05-05 DIAGNOSIS — I341 Nonrheumatic mitral (valve) prolapse: Secondary | ICD-10-CM | POA: Diagnosis present

## 2017-05-05 DIAGNOSIS — I361 Nonrheumatic tricuspid (valve) insufficiency: Secondary | ICD-10-CM | POA: Diagnosis not present

## 2017-05-05 DIAGNOSIS — E1142 Type 2 diabetes mellitus with diabetic polyneuropathy: Secondary | ICD-10-CM | POA: Diagnosis present

## 2017-05-05 DIAGNOSIS — I5023 Acute on chronic systolic (congestive) heart failure: Secondary | ICD-10-CM | POA: Diagnosis not present

## 2017-05-05 DIAGNOSIS — E119 Type 2 diabetes mellitus without complications: Secondary | ICD-10-CM | POA: Diagnosis not present

## 2017-05-05 DIAGNOSIS — I5033 Acute on chronic diastolic (congestive) heart failure: Secondary | ICD-10-CM | POA: Diagnosis present

## 2017-05-05 DIAGNOSIS — E7849 Other hyperlipidemia: Secondary | ICD-10-CM | POA: Diagnosis not present

## 2017-05-05 DIAGNOSIS — Z833 Family history of diabetes mellitus: Secondary | ICD-10-CM

## 2017-05-05 DIAGNOSIS — T82838S Hemorrhage of vascular prosthetic devices, implants and grafts, sequela: Secondary | ICD-10-CM | POA: Diagnosis not present

## 2017-05-05 DIAGNOSIS — N182 Chronic kidney disease, stage 2 (mild): Secondary | ICD-10-CM | POA: Diagnosis present

## 2017-05-05 DIAGNOSIS — M810 Age-related osteoporosis without current pathological fracture: Secondary | ICD-10-CM | POA: Diagnosis present

## 2017-05-05 DIAGNOSIS — I272 Pulmonary hypertension, unspecified: Secondary | ICD-10-CM | POA: Diagnosis present

## 2017-05-05 DIAGNOSIS — Z7984 Long term (current) use of oral hypoglycemic drugs: Secondary | ICD-10-CM

## 2017-05-05 DIAGNOSIS — M79605 Pain in left leg: Secondary | ICD-10-CM

## 2017-05-05 HISTORY — DX: Dependence on supplemental oxygen: Z99.81

## 2017-05-05 LAB — COMPREHENSIVE METABOLIC PANEL
ALT: 17 U/L (ref 14–54)
AST: 20 U/L (ref 15–41)
Albumin: 2.6 g/dL — ABNORMAL LOW (ref 3.5–5.0)
Alkaline Phosphatase: 68 U/L (ref 38–126)
Anion gap: 8 (ref 5–15)
BUN: 30 mg/dL — ABNORMAL HIGH (ref 6–20)
CHLORIDE: 96 mmol/L — AB (ref 101–111)
CO2: 31 mmol/L (ref 22–32)
CREATININE: 0.77 mg/dL (ref 0.44–1.00)
Calcium: 8.3 mg/dL — ABNORMAL LOW (ref 8.9–10.3)
GFR calc Af Amer: >60 mL/min (ref >60)
GFR calc non Af Amer: >60 mL/min (ref >60)
Glucose, Bld: 139 mg/dL — ABNORMAL HIGH (ref 65–99)
Potassium: 4.5 mmol/L (ref 3.5–5.1)
SODIUM: 135 mmol/L (ref 135–145)
Total Bilirubin: 0.6 mg/dL (ref 0.3–1.2)
Total Protein: 5.7 g/dL — ABNORMAL LOW (ref 6.5–8.1)

## 2017-05-05 LAB — URINALYSIS, ROUTINE W REFLEX MICROSCOPIC
BILIRUBIN URINE: NEGATIVE
Bacteria, UA: NONE SEEN
Glucose, UA: NEGATIVE mg/dL
KETONES UR: NEGATIVE mg/dL
Nitrite: NEGATIVE
Protein, ur: NEGATIVE mg/dL
SPECIFIC GRAVITY, URINE: 1.006 (ref 1.005–1.030)
pH: 6 (ref 5.0–8.0)

## 2017-05-05 LAB — CBC WITH DIFFERENTIAL/PLATELET
BASOS ABS: 0 10*3/uL (ref 0.0–0.1)
BASOS PCT: 1 %
EOS ABS: 0.1 10*3/uL (ref 0.0–0.7)
EOS PCT: 2 %
HCT: 40.1 % (ref 36.0–46.0)
Hemoglobin: 12.6 g/dL (ref 12.0–15.0)
Lymphocytes Relative: 22 %
Lymphs Abs: 1.5 10*3/uL (ref 0.7–4.0)
MCH: 27.5 pg (ref 26.0–34.0)
MCHC: 31.4 g/dL (ref 30.0–36.0)
MCV: 87.4 fL (ref 78.0–100.0)
Monocytes Absolute: 0.5 10*3/uL (ref 0.1–1.0)
Monocytes Relative: 8 %
Neutro Abs: 4.5 10*3/uL (ref 1.7–7.7)
Neutrophils Relative %: 67 %
PLATELETS: 240 10*3/uL (ref 150–400)
RBC: 4.59 MIL/uL (ref 3.87–5.11)
RDW: 20.7 % — ABNORMAL HIGH (ref 11.5–15.5)
WBC: 6.6 10*3/uL (ref 4.0–10.5)

## 2017-05-05 LAB — GLUCOSE, CAPILLARY: Glucose-Capillary: 103 mg/dL — ABNORMAL HIGH (ref 65–99)

## 2017-05-05 LAB — BRAIN NATRIURETIC PEPTIDE: B Natriuretic Peptide: 1982 pg/mL — ABNORMAL HIGH (ref 0.0–100.0)

## 2017-05-05 LAB — TROPONIN I

## 2017-05-05 LAB — MRSA PCR SCREENING: MRSA by PCR: NEGATIVE

## 2017-05-05 MED ORDER — ATORVASTATIN CALCIUM 20 MG PO TABS
20.0000 mg | ORAL_TABLET | Freq: Every day | ORAL | Status: DC
  Administered 2017-05-06 – 2017-05-18 (×12): 20 mg via ORAL
  Filled 2017-05-05 (×12): qty 1

## 2017-05-05 MED ORDER — APIXABAN 2.5 MG PO TABS
2.5000 mg | ORAL_TABLET | Freq: Two times a day (BID) | ORAL | Status: DC
  Administered 2017-05-05 – 2017-05-19 (×27): 2.5 mg via ORAL
  Filled 2017-05-05 (×28): qty 1

## 2017-05-05 MED ORDER — INSULIN ASPART 100 UNIT/ML ~~LOC~~ SOLN: 0.0000 [IU] | Freq: Three times a day (TID) | SUBCUTANEOUS | Status: DC

## 2017-05-05 MED ORDER — ISOSORBIDE MONONITRATE ER 60 MG PO TB24
30.0000 mg | ORAL_TABLET | Freq: Every day | ORAL | Status: DC
  Administered 2017-05-06 – 2017-05-07 (×2): 30 mg via ORAL
  Filled 2017-05-05 (×2): qty 1

## 2017-05-05 MED ORDER — SODIUM CHLORIDE 0.9 % IV SOLN: 250.0000 mL | INTRAVENOUS | Status: DC | PRN

## 2017-05-05 MED ORDER — ACETAMINOPHEN 325 MG PO TABS
650.0000 mg | ORAL_TABLET | ORAL | Status: DC | PRN
  Administered 2017-05-05 – 2017-05-17 (×10): 650 mg via ORAL
  Filled 2017-05-05 (×11): qty 2

## 2017-05-05 MED ORDER — GABAPENTIN 100 MG PO CAPS
200.0000 mg | ORAL_CAPSULE | Freq: Three times a day (TID) | ORAL | Status: DC
  Administered 2017-05-05 – 2017-05-19 (×41): 200 mg via ORAL
  Filled 2017-05-05 (×41): qty 2

## 2017-05-05 MED ORDER — PRO-STAT SUGAR FREE PO LIQD
30.0000 mL | Freq: Three times a day (TID) | ORAL | Status: DC
  Administered 2017-05-06 – 2017-05-19 (×38): 30 mL via ORAL
  Filled 2017-05-05 (×35): qty 30

## 2017-05-05 MED ORDER — POLYETHYLENE GLYCOL 3350 17 G PO PACK
17.0000 g | PACK | Freq: Every day | ORAL | Status: DC
  Administered 2017-05-06 – 2017-05-19 (×14): 17 g via ORAL
  Filled 2017-05-05 (×13): qty 1

## 2017-05-05 MED ORDER — DILTIAZEM HCL 60 MG PO TABS
120.0000 mg | ORAL_TABLET | Freq: Every day | ORAL | Status: DC
  Filled 2017-05-05: qty 2

## 2017-05-05 MED ORDER — LEVOTHYROXINE SODIUM 25 MCG PO TABS
25.0000 ug | ORAL_TABLET | Freq: Every evening | ORAL | Status: DC
  Administered 2017-05-05 – 2017-05-18 (×13): 25 ug via ORAL
  Filled 2017-05-05 (×13): qty 1

## 2017-05-05 MED ORDER — SODIUM CHLORIDE 0.9% FLUSH: 3.0000 mL | INTRAVENOUS | Status: DC | PRN

## 2017-05-05 MED ORDER — SODIUM CHLORIDE 0.9% FLUSH
3.0000 mL | Freq: Two times a day (BID) | INTRAVENOUS | Status: DC
  Administered 2017-05-05 – 2017-05-19 (×28): 3 mL via INTRAVENOUS

## 2017-05-05 MED ORDER — MIRTAZAPINE 15 MG PO TABS
7.5000 mg | ORAL_TABLET | Freq: Every day | ORAL | Status: DC
  Administered 2017-05-05 – 2017-05-18 (×14): 7.5 mg via ORAL
  Filled 2017-05-05 (×14): qty 1

## 2017-05-05 MED ORDER — BISOPROLOL FUMARATE 5 MG PO TABS
5.0000 mg | ORAL_TABLET | Freq: Every day | ORAL | Status: DC
  Administered 2017-05-06 – 2017-05-07 (×2): 5 mg via ORAL
  Filled 2017-05-05 (×2): qty 1

## 2017-05-05 MED ORDER — FUROSEMIDE 10 MG/ML IJ SOLN
40.0000 mg | Freq: Three times a day (TID) | INTRAMUSCULAR | Status: DC
  Administered 2017-05-05 – 2017-05-07 (×5): 40 mg via INTRAVENOUS
  Filled 2017-05-05 (×5): qty 4

## 2017-05-05 MED ORDER — ONDANSETRON HCL 4 MG/2ML IJ SOLN: 4.0000 mg | Freq: Four times a day (QID) | INTRAMUSCULAR | Status: DC | PRN

## 2017-05-05 MED ORDER — FUROSEMIDE 10 MG/ML IJ SOLN
40.0000 mg | Freq: Once | INTRAMUSCULAR | Status: AC
  Administered 2017-05-05: 40 mg via INTRAVENOUS
  Filled 2017-05-05: qty 4

## 2017-05-05 MED ORDER — LISINOPRIL 5 MG PO TABS
2.5000 mg | ORAL_TABLET | Freq: Every day | ORAL | Status: DC
  Administered 2017-05-05: 2.5 mg via ORAL
  Filled 2017-05-05 (×2): qty 1

## 2017-05-05 MED ORDER — ASPIRIN EC 81 MG PO TBEC
81.0000 mg | DELAYED_RELEASE_TABLET | Freq: Every day | ORAL | Status: DC
  Administered 2017-05-05 – 2017-05-08 (×3): 81 mg via ORAL
  Filled 2017-05-05 (×5): qty 1

## 2017-05-05 NOTE — ED Triage Notes (Addendum)
Pt went to heart care this am and was sent due to swelling in lower and upper extremities plus swelling in abdomen. Pt reports pain in abdomen. Pt wears chronic O2 at 2 L via [place]. PT reports she feels more SOB than normal. No cough. Pt requires total assist with transfers and is wc bound. Left hand swelling is worse. Pt is resident of [facility]

## 2017-05-05 NOTE — ED Provider Notes (Signed)
[facility] DEPT Provider Note   CSN: [id number] Arrival date & time: 05/05/17  1405     History   Chief Complaint Chief Complaint  Patient presents with  . Leg Swelling    HPI [name] is a 83 y.o. female.  The history is provided by the nursing home, the EMS personnel and medical records. The history is limited by the condition of the patient (Hx dementia).  Pt was seen at 1445. Per NH report, and Cardiology office note from earlier today:  Pt c/o increasing SOB and peripheral edema for unknown period of time. Pt was sent to the ED for further evaluation/admission for CHF. Pt denies CP, no cough, no abd pain.   Past Medical History:  Diagnosis Date  . A-fib ([place])   . Arteriosclerotic cardiovascular disease (ASCVD)    a. Cath 12/2002 showed high grade LAD stenosis treated with DES, also had residual CAD treated medically at that time with 50% + 70-80% mid-septal perforator, 80% D1, 70% OM1, 50% ostial RCA, 25% mRCA.  . Bronchitis    history  . Carcinoma of breast ([place])    left masectomy in 1995  . CHF (congestive heart failure) ([place])   . Chronic diastolic heart failure ([place]) 11/21/2009  . Chronic hoarseness   . CKD (chronic kidney disease), stage II    per historical labs for age, weight, Cr  . CVD (cerebrovascular disease)    plaque w/o focal disease in 2006; h/o CVA  . Dementia   . Dementia   . Diabetes mellitus type II    no insulin   . Edema   . Herpes zoster   . Hyperlipidemia   . Hypertension   . Hyponatremia   . Hypothyroid   . Lower extremity edema 08/20/2011  . Malignant neoplasm of breast (female) ([place])   . MVP (mitral valve prolapse)    moderate; with moderate MR  . On home O2   . Osteoporosis   . Peripheral neuropathy   . Permanent atrial fibrillation ([place])   . PVD (peripheral vascular disease) (HCC)    ABIs of 0.64 and 0.59, right and left leg in 2009  . PVD (peripheral vascular disease) ([place])   . 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 Shingles   .  Ulcer    left lower leg  . Varicose veins of legs 08/20/2011    Patient Active Problem List   Diagnosis Date Noted  . AKI (acute kidney injury) ([place]) 03/07/2017  . Cellulitis 03/07/2017  . Pressure injury of skin 02/18/2017  . Acute diastolic CHF (congestive heart failure) ([place]) 02/18/2017  . Acute encephalopathy 02/18/2017  . Atrial fibrillation, rapid ([place]) 02/18/2017  . Acute on chronic diastolic congestive heart failure ([place]) 02/17/2017  . Permanent atrial fibrillation ([place]) 12/27/2016  . CAD in native artery 12/27/2016  . SIRS (systemic inflammatory response syndrome) ([place]) 12/10/2016  . Elevated troponin 12/10/2016  . SOB (shortness of breath)   . Lewy body dementia without behavioral disturbance   . Acute hypoxemic respiratory failure ([place]) 03/18/2016  . Hyponatremia 03/18/2016  . Abdominal distention 04/13/2014  . Hip pain 04/12/2014  . Labral tear of hip, degenerative 04/12/2014  . Trochanteric bursitis of right hip 04/12/2014  . Encounter for therapeutic drug monitoring 10/06/2013  . Lower extremity edema 08/20/2011  . Arteriosclerotic cardiovascular disease (ASCVD)   . Carcinoma of breast ([place])   . Atrial fibrillation with RVR ([place])   . MVP (mitral valve prolapse)   . PVD (peripheral vascular disease) ([place])   .  CVD (cerebrovascular disease)   . Thyroid activity decreased   . Osteoporosis   . Chronic anticoagulation 11/22/2010  . Type 2 diabetes mellitus ([place]) 11/21/2009  . Hyperlipidemia 11/21/2009  . Hypertension 11/21/2009  . Acute on chronic diastolic heart failure ([place]) 11/21/2009  . SPINAL STENOSIS, LUMBAR 09/05/2008    Past Surgical History:  Procedure Laterality Date  . ABDOMINAL HYSTERECTOMY     abd?  . CARDIAC SURGERY    . CATARACT EXTRACTION, BILATERAL    . CHOLECYSTECTOMY  2006  . COLONOSCOPY  Date unknown  . CORONARY ANGIOPLASTY WITH STENT PLACEMENT    . left masectomy  1995  . ORIF of left hip  05/22/06   Darylene Price History    No  data available       Home Medications    Prior to Admission medications   Medication Sig Start Date End Date Taking? Authorizing Provider  acetaminophen (TYLENOL) 325 MG tablet Take 650 mg by mouth 2 (two) times daily as needed for moderate pain or fever.     [provider]  apixaban (ELIQUIS) 2.5 MG TABS tablet Take 1 tablet (2.5 mg total) by mouth 2 (two) times daily. 12/11/16   Isaac Bliss, Rayford Halsted, MD  atorvastatin (LIPITOR) 20 MG tablet Take 20 mg by mouth daily.    [provider]  bisoprolol (ZEBETA) 5 MG tablet Take 5 mg by mouth daily.    [provider]  calcium carbonate (TUMS - DOSED IN MG ELEMENTAL CALCIUM) 500 MG chewable tablet Chew 1 tablet by mouth 3 (three) times daily before meals.    [provider]  cholecalciferol (VITAMIN D) 1000 units tablet Take 1,000 Units by mouth daily.    [provider]  diltiazem (CARDIZEM) 120 MG tablet Take 1 tablet by mouth daily. 02/28/17   [provider]  furosemide (LASIX) 40 MG tablet Take 1 tablet (40 mg total) by mouth daily. Start on 03/12/17 03/09/17 03/09/18  Kathie Dike, MD  gabapentin (NEURONTIN) 100 MG capsule Take 200 mg by mouth every 8 (eight) hours.     [provider]  isosorbide mononitrate (IMDUR) 30 MG 24 hr tablet Take 30 mg by mouth daily.    [provider]  levalbuterol Penne Lash) 0.63 MG/3ML nebulizer solution Inhale 3 mLs into the lungs 3 (three) times daily as needed for wheezing or cough. Via nebulizer 12/17/16   [provider]  levothyroxine (SYNTHROID, LEVOTHROID) 25 MCG tablet Take 25 mcg by mouth every evening.     [provider]  metFORMIN (GLUCOPHAGE) 500 MG tablet Take 500 mg by mouth 2 (two) times daily with a meal.    [provider]  mirtazapine (REMERON) 7.5 MG tablet  03/20/17   [provider]  ondansetron (ZOFRAN) 4 MG tablet Take 1 mg by mouth every 6 (six) hours as needed for nausea or  vomiting.    [provider]  polyethylene glycol (MIRALAX / GLYCOLAX) packet Take 17 g by mouth daily.    [provider]    Family History Family History  Problem Relation Age of Onset  . Diabetes Mother     Social History Social History  Substance Use Topics  . Smoking status: Never Smoker  . Smokeless tobacco: Never Used     Comment: tobacco use - no   . Alcohol use No     Allergies   Patient has no known allergies.   Review of Systems Review of Systems  Unable to  perform ROS: Dementia     Physical Exam Updated Vital Signs BP 123/85 (BP Location: Right Arm)   Pulse (!) 42   Temp 97.9 F (36.6 C) (Oral)   Ht 5\' 6"  (1.676 m)   Wt 54.9 kg (121 lb)   SpO2 100%   BMI 19.53 kg/m    Patient Vitals for the past 24 hrs:  BP Temp Temp src Pulse SpO2 Height Weight  05/05/17 1428 - - - - - 5\' 6"  (1.676 m) 54.9 kg (121 lb)  05/05/17 1426 123/85 97.9 F (36.6 C) Oral (!) 42 100 % - -     Physical Exam 1450: Physical examination:  Nursing notes reviewed; Vital signs and O2 SAT reviewed;  Constitutional: Well developed, Well nourished, In no acute distress; Head:  Normocephalic, atraumatic; Eyes: EOMI, PERRL, No scleral icterus; ENMT: Mouth and pharynx normal, Mucous membranes dry; Neck: Supple, Full range of motion, No lymphadenopathy; Cardiovascular: Irregular rate and rhythm, No gallop; Respiratory: Breath sounds coarse & equal bilaterally, No wheezes.  Speaking sentences, Normal respiratory effort/excursion; Chest: Nontender, Movement normal; Abdomen: Soft, Nontender, Nondistended, Normal bowel sounds; Genitourinary: No CVA tenderness; Extremities: Pulses normal, No tenderness, +2-3 edema to bilat UE's, +1-2 bilat LE's with chronic stasis changes. No calf asymmetry.; Neuro: Awake, alert, confused re: events. No facial droop. Speech clear. Pt moves all extremities on stretcher spontaneously and to command..; Skin: Color normal, Warm, Dry.   ED  Treatments / Results  Labs (all labs ordered are listed, but only abnormal results are displayed)   EKG  EKG Interpretation  Date/Time:  Monday May 05 2017 14:32:58 EDT Ventricular Rate:  119 PR Interval:    QRS Duration: 78 QT Interval:  328 QTC Calculation: 461 R Axis:   120 Text Interpretation:  Atrial fibrillation with rapid ventricular response Right axis deviation Nonspecific ST and T wave abnormality Baseline wander When compared with ECG of 03/07/2017 Rate faster Confirmed by Francine Graven [phone]) on 05/05/2017 2:54:18 PM       Radiology   Procedures Procedures (including critical care time)  Medications Ordered in ED Medications  furosemide (LASIX) injection 40 mg (not administered)     Initial Impression / Assessment and Plan / ED Course  I have reviewed the triage vital signs and the nursing notes.  Pertinent labs & imaging results that were available during my care of the patient were reviewed by me and considered in my medical decision making (see chart for details).  MDM Reviewed: previous chart, nursing note and vitals Reviewed previous: labs and ECG Interpretation: labs, ECG and x-ray   Results for orders placed or performed during the hospital encounter of 05/05/17  CBC with Differential  Result Value Ref Range   WBC 6.6 4.0 - 10.5 K/uL   RBC 4.59 3.87 - 5.11 MIL/uL   Hemoglobin 12.6 12.0 - 15.0 g/dL   HCT 40.1 36.0 - 46.0 %   MCV 87.4 78.0 - 100.0 fL   MCH 27.5 26.0 - 34.0 pg   MCHC 31.4 30.0 - 36.0 g/dL   RDW 20.7 (H) 11.5 - 15.5 %   Platelets 240 150 - 400 K/uL   Neutrophils Relative % 67 %   Neutro Abs 4.5 1.7 - 7.7 K/uL   Lymphocytes Relative 22 %   Lymphs Abs 1.5 0.7 - 4.0 K/uL   Monocytes Relative 8 %   Monocytes Absolute 0.5 0.1 - 1.0 K/uL   Eosinophils Relative 2 %   Eosinophils Absolute 0.1 0.0 - 0.7 K/uL  Basophils Relative 1 %   Basophils Absolute 0.0 0.0 - 0.1 K/uL  Comprehensive metabolic panel  Result Value Ref  Range   Sodium 135 135 - 145 mmol/L   Potassium 4.5 3.5 - 5.1 mmol/L   Chloride 96 (L) 101 - 111 mmol/L   CO2 31 22 - 32 mmol/L   Glucose, Bld 139 (H) 65 - 99 mg/dL   BUN 30 (H) 6 - 20 mg/dL   Creatinine, Ser 0.77 0.44 - 1.00 mg/dL   Calcium 8.3 (L) 8.9 - 10.3 mg/dL   Total Protein 5.7 (L) 6.5 - 8.1 g/dL   Albumin 2.6 (L) 3.5 - 5.0 g/dL   AST 20 15 - 41 U/L   ALT 17 14 - 54 U/L   Alkaline Phosphatase 68 38 - 126 U/L   Total Bilirubin 0.6 0.3 - 1.2 mg/dL   GFR calc non Af Amer >60 >60 mL/min   GFR calc Af Amer >60 >60 mL/min   Anion gap 8 5 - 15  Brain natriuretic peptide  Result Value Ref Range   B Natriuretic Peptide 1,982.0 (H) 0.0 - 100.0 pg/mL  Troponin I  Result Value Ref Range   Troponin I <0.03 <0.03 ng/mL   Dg Chest Port 1 View Result Date: 05/05/2017 CLINICAL DATA:  Patient with swelling. EXAM: PORTABLE CHEST 1 VIEW COMPARISON:  Chest radiograph 03/07/2017. FINDINGS: Monitoring leads overlie the patient. Low lung volumes. Stable cardiomegaly with aortic atherosclerosis. Diffuse bilateral heterogeneous pulmonary opacities. Moderate layering bilateral pleural effusions. No pneumothorax. IMPRESSION: Findings most compatible with pulmonary edema and moderate layering bilateral pleural effusions. Cardiomegaly. Electronically Signed   By: Lovey Newcomer M.D.   On: 05/05/2017 15:03    1700:  BNP elevated from previous with CHF on CXR; IV lasix given on arrival to ED. HR ranging 90-110's while in the ED; likely will need meds adjustment for better rate control. T/C to [facility] Dr. Lorin Mercy, case discussed, including:  HPI, pertinent PM/SHx, VS/PE, dx testing, ED course and treatment:  Agreeable to admit.     Final Clinical Impressions(s) / ED Diagnoses   Final diagnoses:  Swelling    New Prescriptions New Prescriptions   No medications on file      Francine Graven, DO 05/07/17 1602

## 2017-05-05 NOTE — Progress Notes (Signed)
Cardiology Office Note   Date:  05/05/2017   ID:  [name] 1920-11-03, MRN [id number]  PCP:  Hilbert Corrigan, MD  Cardiologist:  Wyatt Haste, M.D. Chief Complaint  Patient presents with  . Atrial Fibrillation  . Coronary Artery Disease  . Congestive Heart Failure  . Hypertension      History of Present Illness: [name] is a 83 y.o. female who presents for ongoing assessment and management of atrial fibrillation, history of CAD with DES to the LAD in 9924, chronic diastolic heart failure, chronic dependent edema hypertension, with other history to include hypothyroidism. She is a resident of [facility].   The patient was last seen in the office in July 2018, at that time the patient was not eating well, continued to lose weight, and become more depressed while at assisted-living facility. She refuses pertussis patient physical therapy. No medication changes were made on last office visit.   She is here today very edematous, only up 5 pounds, but has 2-3+ pitting edema up past the knees, and bilateral upper extremities. The patient has mild dementia and is a poor historian. She is unaware of her diet or salt intake, she does take her medications which are provided to her by the skilled nursing facility. She denies PND, but she states that she needs her oxygen.  Past Medical History:  Diagnosis Date  . A-fib ([place])   . Arteriosclerotic cardiovascular disease (ASCVD)    a. Cath 12/2002 showed high grade LAD stenosis treated with DES, also had residual CAD treated medically at that time with 50% + 70-80% mid-septal perforator, 80% D1, 70% OM1, 50% ostial RCA, 25% mRCA.  . Bronchitis    history  . Carcinoma of breast ([place])    left masectomy in 1995  . CHF (congestive heart failure) ([place])   . Chronic diastolic heart failure ([place]) 11/21/2009  . Chronic hoarseness   . CKD (chronic kidney disease), stage II    per historical labs for age, weight, Cr  . CVD (cerebrovascular  disease)    plaque w/o focal disease in 2006; h/o CVA  . Dementia   . Dementia   . Diabetes mellitus type II    no insulin   . Edema   . Herpes zoster   . Hyperlipidemia   . Hypertension   . Hyponatremia   . Hypothyroid   . Lower extremity edema 08/20/2011  . Malignant neoplasm of breast (female) ([place])   . MVP (mitral valve prolapse)    moderate; with moderate MR  . Osteoporosis   . Peripheral neuropathy   . Permanent atrial fibrillation ([place])   . PVD (peripheral vascular disease) (HCC)    ABIs of 0.64 and 0.59, right and left leg in 2009  . PVD (peripheral vascular disease) ([place])   . 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 Shingles   . Ulcer    left lower leg  . Varicose veins of legs 08/20/2011    Past Surgical History:  Procedure Laterality Date  . ABDOMINAL HYSTERECTOMY     abd?  . CARDIAC SURGERY    . CATARACT EXTRACTION, BILATERAL    . CHOLECYSTECTOMY  2006  . COLONOSCOPY  Date unknown  . CORONARY ANGIOPLASTY WITH STENT PLACEMENT    . left masectomy  1995  . ORIF of left hip  05/22/06   [name]     Current Outpatient Prescriptions  Medication Sig Dispense Refill  . acetaminophen (TYLENOL) 325 MG tablet Take 650 mg by  mouth 2 (two) times daily as needed for moderate pain or fever.     Marland Kitchen apixaban (ELIQUIS) 2.5 MG TABS tablet Take 1 tablet (2.5 mg total) by mouth 2 (two) times daily. 60 tablet 2  . atorvastatin (LIPITOR) 20 MG tablet Take 20 mg by mouth daily.    . bisoprolol (ZEBETA) 5 MG tablet Take 5 mg by mouth daily.    . calcium carbonate (TUMS - DOSED IN MG ELEMENTAL CALCIUM) 500 MG chewable tablet Chew 1 tablet by mouth 3 (three) times daily before meals.    . cholecalciferol (VITAMIN D) 1000 units tablet Take 1,000 Units by mouth daily.    Marland Kitchen diltiazem (CARDIZEM) 120 MG tablet Take 1 tablet by mouth daily.    . furosemide (LASIX) 40 MG tablet Take 1 tablet (40 mg total) by mouth daily. Start on 03/12/17 30 tablet 11  . gabapentin (NEURONTIN) 100 MG capsule Take 200 mg  by mouth every 8 (eight) hours.     . isosorbide mononitrate (IMDUR) 30 MG 24 hr tablet Take 30 mg by mouth daily.    Marland Kitchen levalbuterol (XOPENEX) 0.63 MG/3ML nebulizer solution Inhale 3 mLs into the lungs 3 (three) times daily as needed for wheezing or cough. Via nebulizer    . levothyroxine (SYNTHROID, LEVOTHROID) 25 MCG tablet Take 25 mcg by mouth every evening.     . metFORMIN (GLUCOPHAGE) 500 MG tablet Take 500 mg by mouth 2 (two) times daily with a meal.    . ondansetron (ZOFRAN) 4 MG tablet Take 1 mg by mouth every 6 (six) hours as needed for nausea or vomiting.    . polyethylene glycol (MIRALAX / GLYCOLAX) packet Take 17 g by mouth daily.     No current facility-administered medications for this visit.     Allergies:   Patient has no known allergies.    Social History:  The patient  reports that she has never smoked. She has never used smokeless tobacco. She reports that she does not drink alcohol or use drugs.   Family History:  The patient's family history includes Diabetes in her mother.    ROS: All other systems are reviewed and negative. Unless otherwise mentioned in H&P    PHYSICAL EXAM: VS:  Ht 5\' 6"  (1.676 m)   Wt 121 lb (54.9 kg)   BMI 19.53 kg/m  , BMI Body mass index is 19.53 kg/m. GEN: Well nourished, well developed, in no acute distress  HEENT: normal  Neck: no JVD, carotid bruits, or masses Cardiac: IRRR; 2/6 systolic murmurs, rubs, S3 gallops,no edema  Respiratory:  Diminished bilaterally, poor respiratory effort, mild crackles on inspiration, no cough. Wearing oxygen via nasal cannula GI: soft, nontender, nondistended, + BS MS: no deformity or atrophy bilateral 3+ pitting edema in the lower extremities up past the knees, 2+ edema into the thighs and bilateral arms. Skin: warm and dry, no rash. Pale Neuro:  Strength and sensation are intact very hard appearing Psych: Mild confusion.  Recent Labs: 12/10/2016: TSH 0.465 02/17/2017: B Natriuretic Peptide 1,190.0;  Magnesium 1.8 03/08/2017: ALT 32 03/09/2017: BUN 38; Creatinine, Ser 0.79; Hemoglobin 13.4; Platelets 236; Potassium 3.9; Sodium 143    Lipid Panel    Component Value Date/Time   CHOL  10/19/2010 0615    120        ATP III CLASSIFICATION:  <200     mg/dL   Desirable  200-239  mg/dL   Borderline High  >=240    mg/dL   High  TRIG 74 10/19/2010 0615   HDL 39 (L) 10/19/2010 0615   CHOLHDL 3.1 10/19/2010 0615   VLDL 15 10/19/2010 0615   LDLCALC  10/19/2010 0615    66        Total Cholesterol/HDL:CHD Risk Coronary Heart Disease Risk Table                     Men   Women  1/2 Average Risk   3.4   3.3  Average Risk       5.0   4.4  2 X Average Risk   9.6   7.1  3 X Average Risk  23.4   11.0        Use the calculated Patient Ratio above and the CHD Risk Table to determine the patient's CHD Risk.        ATP III CLASSIFICATION (LDL):  <100     mg/dL   Optimal  100-129  mg/dL   Near or Above                    Optimal  130-159  mg/dL   Borderline  160-189  mg/dL   High  >190     mg/dL   Very High      Wt Readings from Last 3 Encounters:  05/05/17 121 lb (54.9 kg)  03/07/17 123 lb 7.3 oz (56 kg)  03/03/17 116 lb (52.6 kg)      Other studies Reviewed: Echocardiogram March 21, 2017 Left ventricle: The cavity size was normal. There was focal basal   hypertrophy. Systolic function was normal. The estimated ejection   fraction was in the range of 60% to 65%. Wall motion was normal;   there were no regional wall motion abnormalities. - Aortic valve: Moderately calcified annulus. Trileaflet; mildly   thickened leaflets. Valve area (VTI): 1.55 cm^2. Valve area   (Vmax): 1.4 cm^2. Valve area (Vmean): 1.39 cm^2. - Mitral valve: Moderately calcified annulus. Mildly thickened   leaflets . There was mild regurgitation. - Left atrium: The atrium was massively dilated. - Right atrium: The atrium was severely dilated. - Atrial septum: No defect or patent foramen ovale was  identified. - Pulmonary arteries: Systolic pressure was moderately increased.   PA peak pressure: 47 mm Hg (S). - Pericardium, extracardiac: There is a large left pleural   effusion. There is a small circumferential pericardial effusion. - Technically adequate study.  ASSESSMENT AND PLAN: Has been on metformin. With chronic has been on metformin. With chronic kidney disease this may need to be removed 1. Acute on Chronic Diastolic CHF: Significant volume overload. Review of her medications has her on Lasix 40 mg daily, but on last office visit July 2018 she was advanced to 60 mg in the morning and 40 mg in the afternoon. She is only gained 5 pounds but has poor appetite and uncertain now of dry weight. She is extremely edematous of passer size bilaterally and bilateral arms. She will be sent to ER for IV diuresis and admission for ongoing evaluation and management. We'll repeat echocardiogram for changes in LV function. Most recent echocardiogram was one year ago with normal LV function. We will follow on consultation throughout hospitalization from cardiology standpoint. Usual labs and x-rays will be completed.  2. Atrial fibrillation: Heart rate is not well controlled currently. Review of medications reveals that she is on diltiazem 120 mg daily, apixaban 2.5 mg twice a day. CHADS VASC Score of 6. May need to add adjustments in medications  once she is fully diuresed.   3. Coronary artery disease: History of drug-eluting stents. She is no longer on antiplatelet therapy. She remains on bisoprolol 5 mg daily. May need to up titrate this once she is hospitalized.  4. Diabetes: Patient is on metformin. With history of chronic kidney disease would recommend this not be used. Consider sliding scale insulin throughout hospitalization.  5. Hypercholesterolemia: Continue statin therapy.  Current medicines are reviewed at length with the patient today.    Labs/ tests ordered today include:  Phill Myron.  West Pugh, ANP, AACC   05/05/2017 1:32 PM    [facility] [address] [place], [place] [address] Phone: [phone]; Fax: [phone]

## 2017-05-05 NOTE — ED Notes (Signed)
RN unable to get blood from IV.

## 2017-05-05 NOTE — ED Notes (Signed)
Hospitalist at bedside 

## 2017-05-05 NOTE — ED Notes (Signed)
Lab at bedside

## 2017-05-05 NOTE — H&P (Signed)
History and Physical    [name] VVO:[id number] DOB: 1921-06-24 DOA: 05/05/2017  PCP: Hilbert Corrigan, MD Consultants:  Harrington Challenger - cardiology Patient coming from: [facility]   Chief Complaint: edema  HPI: [name] is a 83 y.o. female with medical history significant of diastolic heart failure, hypertension, diabetes, atrial fibrillation on Eliquis, hypothyroidism, and CAD presenting with concern for new heart failure after being seen in the cardiology office.  Patient is unaccompanied and was unable to provide a thorough history.  She does not report SOB to me and denies cough, chest pain.  In the cardiology office, she was found to be very edematous with 5 pound weight gain.  There was concern for significant volume overload.  Review of medications showed that she has been taking Lasix 40 mg daily when it was ordered in July for 60 mg qAM and 40 mg qPM.  Echo 1 year ago showed normal LV function.  She was also in afib with poor rate control.  She takes Dilt 120 mg daily, Bisoprolol 5 mg daily, and apixiban 2.5 mg BID with CHA2DS2-VASc score of 6.    ED Course: BNP elevated, volume overload on CXR.  Given IV Lasix.  HR 90-110s.  Review of Systems: As per HPI; otherwise review of systems reviewed and negative.  This is quite suspect given patient's overall fairly poor cognition.   Past Medical History:  Diagnosis Date  . A-fib ([place])   . Arteriosclerotic cardiovascular disease (ASCVD)    a. Cath 12/2002 showed high grade LAD stenosis treated with DES, also had residual CAD treated medically at that time with 50% + 70-80% mid-septal perforator, 80% D1, 70% OM1, 50% ostial RCA, 25% mRCA.  . Bronchitis    history  . Carcinoma of breast ([place])    left masectomy in 1995  . CHF (congestive heart failure) ([place])   . Chronic diastolic heart failure ([place]) 11/21/2009  . Chronic hoarseness   . CKD (chronic kidney disease), stage II    per historical labs for age, weight, Cr  . CVD  (cerebrovascular disease)    plaque w/o focal disease in 2006; h/o CVA  . Dementia   . Dementia   . Diabetes mellitus type II    no insulin   . Edema   . Herpes zoster   . Hyperlipidemia   . Hypertension   . Hyponatremia   . Hypothyroid   . Lower extremity edema 08/20/2011  . Malignant neoplasm of breast (female) ([place])   . MVP (mitral valve prolapse)    moderate; with moderate MR  . On home O2   . Osteoporosis   . Peripheral neuropathy   . Permanent atrial fibrillation ([place])   . PVD (peripheral vascular disease) (H[facility])    ABIs of 0.64 and 0.59, right and left leg in 2009  . PVD (peripheral vascular disease) ([place])   . 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 Shingles   . Ulcer    left lower leg  . Varicose veins of legs 08/20/2011    Past Surgical History:  Procedure Laterality Date  . ABDOMINAL HYSTERECTOMY     abd?  . CARDIAC SURGERY    . CATARACT EXTRACTION, BILATERAL    . CHOLECYSTECTOMY  2006  . COLONOSCOPY  Date unknown  . CORONARY ANGIOPLASTY WITH STENT PLACEMENT    . left masectomy  1995  . ORIF of left hip  05/22/06   [name]    Social History   Social History  .  Marital status: Widowed    Spouse name: N/A  . Number of children: N/A  . Years of education: N/A   Occupational History  . Not on file.   Social History Main Topics  . Smoking status: Never Smoker  . Smokeless tobacco: Never Used     Comment: tobacco use - no   . Alcohol use No  . Drug use: No  . Sexual activity: Not on file   Other Topics Concern  . Not on file   Social History Narrative   Widowed, retired, does not get regular exercise.     No Known Allergies  Family History  Problem Relation Age of Onset  . Diabetes Mother     Prior to Admission medications   Medication Sig Start Date End Date Taking? Authorizing Provider  Amino Acids-Protein Hydrolys (FEEDING SUPPLEMENT, PRO-STAT SUGAR FREE 64,) LIQD Take 30 mLs by mouth 3 (three) times daily with meals.   Yes [provider]   apixaban (ELIQUIS) 2.5 MG TABS tablet Take 1 tablet (2.5 mg total) by mouth 2 (two) times daily. 12/11/16  Yes Isaac Bliss, Rayford Halsted, MD  atorvastatin (LIPITOR) 20 MG tablet Take 20 mg by mouth daily.   Yes [provider]  bisoprolol (ZEBETA) 5 MG tablet Take 5 mg by mouth daily.   Yes [provider]  calcium carbonate (TUMS - DOSED IN MG ELEMENTAL CALCIUM) 500 MG chewable tablet Chew 1 tablet by mouth 3 (three) times daily before meals.   Yes [provider]  cholecalciferol (VITAMIN D) 1000 units tablet Take 1,000 Units by mouth daily.   Yes [provider]  diltiazem (CARDIZEM) 120 MG tablet Take 1 tablet by mouth daily. 02/28/17  Yes [provider]  furosemide (LASIX) 40 MG tablet Take 1 tablet (40 mg total) by mouth daily. Start on 03/12/17 03/09/17 03/09/18 Yes Kathie Dike, MD  gabapentin (NEURONTIN) 100 MG capsule Take 200 mg by mouth every 8 (eight) hours.    Yes [provider]  isosorbide mononitrate (IMDUR) 30 MG 24 hr tablet Take 30 mg by mouth daily.   Yes [provider]  levalbuterol (XOPENEX) 0.63 MG/3ML nebulizer solution Inhale 3 mLs into the lungs 3 (three) times daily as needed for wheezing or cough. Via nebulizer 12/17/16  Yes [provider]  levothyroxine (SYNTHROID, LEVOTHROID) 25 MCG tablet Take 25 mcg by mouth every evening.    Yes [provider]  metFORMIN (GLUCOPHAGE) 500 MG tablet Take 500 mg by mouth 2 (two) times daily with a meal.   Yes [provider]  mirtazapine (REMERON) 7.5 MG tablet Take 7.5 mg by mouth at bedtime.  03/20/17  Yes [provider]  ondansetron (ZOFRAN) 4 MG tablet Take 1 mg by mouth every 6 (six) hours as needed for nausea or vomiting.   Yes [provider]  polyethylene glycol (MIRALAX / GLYCOLAX) packet Take 17 g by mouth daily.   Yes [provider]  Probiotic Product (PROBIOTIC FORMULA PO) Take 1 capsule by mouth daily.   Yes  [provider]    Physical Exam: Vitals:   05/05/17 1700 05/05/17 1821 05/05/17 1857 05/05/17 1936  BP: 103/75 113/68 114/68   Pulse: 100 97 77   Resp: (!) 22  18   Temp:  (!) 97.5 F (36.4 C) 98 F (36.7 C)   TempSrc:  Oral Oral   SpO2: 98% 100% 100% 96%  Weight:   74 kg (163 lb 2.3 oz)   Height:  5\' 6"  (1.676 m)      General:  Appears calm and comfortable and is NAD Eyes:  PERRL, EOMI, normal lids, iris ENT:  grossly normal hearing, lips & tongue, mmm Neck:  no LAD, masses or thyromegaly; no carotid bruits Cardiovascular:  RRR, no m/r/g.  Respiratory:   CTA bilaterally with no wheezes/rales/rhonchi.  Normal respiratory effort. Abdomen:  soft, NT, ND, NABS, +abdominal wall and labial edema Skin: diffuse marked edema of upper and lower extremities as well as truncal regions, but sparing of face and neck Musculoskeletal:  grossly normal tone BUE/BLE, good ROM, no bony abnormality. Limited ROM due to anasarca. Lower extremity:   Limited foot exam with no ulcerations.  2+ distal pulses. Psychiatric:  grossly normal mood and affect, speech fluent and appropriate, AOx2 Neurologic:  CN 2-12 grossly intact, moves all extremities in coordinated fashion, sensation intact    Radiological Exams on Admission: Dg Chest Port 1 View  Result Date: 05/05/2017 CLINICAL DATA:  Patient with swelling. EXAM: PORTABLE CHEST 1 VIEW COMPARISON:  Chest radiograph 03/07/2017. FINDINGS: Monitoring leads overlie the patient. Low lung volumes. Stable cardiomegaly with aortic atherosclerosis. Diffuse bilateral heterogeneous pulmonary opacities. Moderate layering bilateral pleural effusions. No pneumothorax. IMPRESSION: Findings most compatible with pulmonary edema and moderate layering bilateral pleural effusions. Cardiomegaly. Electronically Signed   By: Lovey Newcomer M.D.   On: 05/05/2017 15:03    EKG: Independently reviewed.  Afib with rate 119; nonspecific ST changes that are likely  rate-related   Labs on Admission: I have personally reviewed the available labs and imaging studies at the time of the admission.  Pertinent labs:   Glucose 139 BUN 30/Creatinine 0.77/GFR >60 - stable Albumin 2.6 BNP 1982; 1190 on 7/9 Troponin <0.03 WBC 6.6 UA: small Hgb, trace LE  Assessment/Plan Principal Problem:   Anasarca Active Problems:   Type 2 diabetes mellitus (HCC)   Hyperlipidemia   Hypertension   Acute on chronic diastolic heart failure (HCC)   Chronic anticoagulation   Lewy body dementia without behavioral disturbance   Permanent atrial fibrillation (HCC)   Severe protein-calorie malnutrition ([place])   Anasarca -Patient presenting with marked anasarca which is diffuse and appears to only spare her face and neck -When the BP cuff was applied to her arm, the edema migrated to above and below the cuff -Anasarca is likely due to a combination of CHF and malnutrition - see below  CHF -Patient with a h/o Echo on 03/18/16 which showed preserved EF, without mention of diastolic function; there is no other echo available at this time in Epic including Care Everywhere -Elevated BNP -Abnormal CXR with pulmonary edema -With elevated BNP and abnl CXR, CHF most probable as diagnosis -Will admit with telemetry -Will request echocardiogram -Will start ASA -Will start Lisinopril 2.5 mg daily (BP is normal and so will hopefully support addition of medication) -Continue low-dose bisoprolol -CHF order set utilized -Was given Lasix 40 mg x 1 in ER and will repeat with 40 mg IV q8h to try to continuously draw the fluid off; this may need to be decreased or held if her kidneys will not support it -prn [place] O2 for now -Normal kidney function at this time, will follow -Repeat EKG in AM -Cardiology to continue consultation in the AM -Based on the extent of her edema, it may take several days to effectively diurese her  Afib -Patient was in RVR while at cardiology appointment and  upon arrival in the ER -She appeared to have converted to NSR or  at least was rate controlled at the time of my evaluation -Will continue Diltiazem and Bisoprolol but there is no current indication for diltiazem drip/SDU placement -CHA2DS2-VASc score is 6, continue Eliquis  DM -A1c in 7/18 was 7.2 -Given her advanced age and overall health status, her glucose goal is likely 150-250 and so she is appropriately controlled -Hold Metformin for now but it is likely okay to resume this medication at the time of discharge -Cover with sensitive-scale SSI  HTN -Good control for now  HLD -Uncertain risk:benefit ratio from statin at this point but will continue Lipitor for now  Dementia -Continue Aricept -Lipitor and other statins are associated with memory loss  Malnutrition -Consider nutrition consult -It is possible that Albumin infusion could improve her anasarca to some extent, but since it is so short-lived her benefit would likely be fairly small  DVT prophylaxis: Eliquis Code Status:  DNR - has gold form Family Communication: None present Disposition Plan:  Back to SNF once clinically improved Consults called: Cardiology; SW Admission status: Admit - It is my clinical opinion that admission to [place] is reasonable and necessary because this patient will require at least 2 midnights in the hospital to treat this condition based on the medical complexity of the problems presented.  Given the aforementioned information, the predictability of an adverse outcome is felt to be significant.    Karmen Bongo MD [facility]  If note is complete, please contact covering daytime or nighttime physician. [email or web address] Password [facility]  05/05/2017, 9:13 PM

## 2017-05-06 ENCOUNTER — Inpatient Hospital Stay (HOSPITAL_COMMUNITY): Payer: Medicare Other

## 2017-05-06 DIAGNOSIS — R601 Generalized edema: Secondary | ICD-10-CM

## 2017-05-06 DIAGNOSIS — I5033 Acute on chronic diastolic (congestive) heart failure: Secondary | ICD-10-CM

## 2017-05-06 DIAGNOSIS — E784 Other hyperlipidemia: Secondary | ICD-10-CM

## 2017-05-06 DIAGNOSIS — I361 Nonrheumatic tricuspid (valve) insufficiency: Secondary | ICD-10-CM

## 2017-05-06 LAB — ECHOCARDIOGRAM COMPLETE
AOPV: 0.24 m/s
AOVTI: 36.2 cm
AV Area VTI: 0.61 cm2
AV VEL mean LVOT/AV: 0.26
AV area mean vel ind: 0.38 cm2/m2
AV pk vel: 190 cm/s
AV vel: 0.79
AVAREAMEANV: 0.67 cm2
AVAREAVTIIND: 0.45 cm2/m2
AVG: 7 mmHg
AVPG: 14 mmHg
CHL CUP AV PEAK INDEX: 0.35
CHL CUP AV VALUE AREA INDEX: 0.45
CHL CUP DOP CALC LVOT VTI: 11.3 cm
CHL CUP MV DEC (S): 148
CHL CUP STROKE VOLUME: 45 mL
DOP CAL AO MEAN VELOCITY: 118 cm/s
E/e' ratio: 15.84
EWDT: 148 ms
FS: 45 % — AB (ref 28–44)
Height: 66 in
IVS/LV PW RATIO, ED: 1.06
LA ID, A-P, ES: 58 mm
LA diam end sys: 58 mm
LA diam index: 3.28 cm/m2
LA vol: 128 mL
LAVOLA4C: 137 mL
LAVOLIN: 72.4 mL/m2
LV E/e'average: 15.84
LV PW d: 13.3 mm — AB (ref 0.6–1.1)
LV SIMPSON'S DISK: 62
LV TDI E'MEDIAL: 4.9
LV dias vol: 73 mL (ref 46–106)
LV sys vol: 28 mL
LVDIAVOLIN: 41 mL/m2
LVEEMED: 15.84
LVELAT: 9.03 cm/s
LVOT area: 2.54 cm2
LVOT peak grad rest: 1 mmHg
LVOT peak vel: 45.6 cm/s
LVOTD: 18 mm
LVOTSV: 29 mL
LVOTVTI: 0.31 cm
LVSYSVOLIN: 16 mL/m2
Lateral S' vel: 5.33 cm/s
MV Peak grad: 8 mmHg
MV pk E vel: 143 m/s
MVPKAVEL: 38.8 m/s
RV sys press: 90 mmHg
Reg peak vel: 433 cm/s
TAPSE: 9.57 mm
TDI e' lateral: 9.03
TR max vel: 433 cm/s
VTI: 163 cm
Valve area: 0.79 cm2
Weight: 2349.22 oz

## 2017-05-06 LAB — CBC WITH DIFFERENTIAL/PLATELET
BASOS ABS: 0 10*3/uL (ref 0.0–0.1)
Basophils Relative: 1 %
EOS ABS: 0.1 10*3/uL (ref 0.0–0.7)
Eosinophils Relative: 2 %
HCT: 37.7 % (ref 36.0–46.0)
Hemoglobin: 11.7 g/dL — ABNORMAL LOW (ref 12.0–15.0)
Lymphocytes Relative: 20 %
Lymphs Abs: 1 10*3/uL (ref 0.7–4.0)
MCH: 27.5 pg (ref 26.0–34.0)
MCHC: 31 g/dL (ref 30.0–36.0)
MCV: 88.5 fL (ref 78.0–100.0)
MONO ABS: 0.4 10*3/uL (ref 0.1–1.0)
Monocytes Relative: 7 %
Neutro Abs: 3.5 10*3/uL (ref 1.7–7.7)
Neutrophils Relative %: 70 %
PLATELETS: 214 10*3/uL (ref 150–400)
RBC: 4.26 MIL/uL (ref 3.87–5.11)
RDW: 20.8 % — AB (ref 11.5–15.5)
WBC: 5 10*3/uL (ref 4.0–10.5)

## 2017-05-06 LAB — GLUCOSE, CAPILLARY
GLUCOSE-CAPILLARY: 56 mg/dL — AB (ref 65–99)
GLUCOSE-CAPILLARY: 157 mg/dL — AB (ref 65–99)
Glucose-Capillary: 60 mg/dL — ABNORMAL LOW (ref 65–99)
Glucose-Capillary: 50 mg/dL — ABNORMAL LOW (ref 65–99)
Glucose-Capillary: 68 mg/dL (ref 65–99)
Glucose-Capillary: 88 mg/dL (ref 65–99)
Glucose-Capillary: 123 mg/dL — ABNORMAL HIGH (ref 65–99)

## 2017-05-06 LAB — BASIC METABOLIC PANEL
ANION GAP: 5 (ref 5–15)
BUN: 30 mg/dL — ABNORMAL HIGH (ref 6–20)
CALCIUM: 8.3 mg/dL — AB (ref 8.9–10.3)
CO2: 34 mmol/L — ABNORMAL HIGH (ref 22–32)
Chloride: 97 mmol/L — ABNORMAL LOW (ref 101–111)
Creatinine, Ser: 0.62 mg/dL (ref 0.44–1.00)
GFR calc Af Amer: >60 mL/min (ref >60)
GLUCOSE: 130 mg/dL — AB (ref 65–99)
Potassium: 4.3 mmol/L (ref 3.5–5.1)
SODIUM: 136 mmol/L (ref 135–145)

## 2017-05-06 LAB — TSH: TSH: 2.692 u[IU]/mL (ref 0.350–4.500)

## 2017-05-06 MED ORDER — DILTIAZEM HCL ER COATED BEADS 120 MG PO CP24: 120.0000 mg | ORAL_CAPSULE | Freq: Every day | ORAL | Status: DC

## 2017-05-06 MED ORDER — LISINOPRIL 5 MG PO TABS
2.5000 mg | ORAL_TABLET | Freq: Every day | ORAL | Status: DC
  Administered 2017-05-06 – 2017-05-07 (×2): 2.5 mg via ORAL
  Filled 2017-05-06: qty 1

## 2017-05-06 NOTE — Evaluation (Signed)
Physical Therapy Evaluation Patient Details Name: [name] MRN: [id number] DOB: [birth year] Today's Date: 05/06/2017   History of Present Illness  [name] is a 83 y.o. female with medical history significant of diastolic heart failure, hypertension, diabetes, atrial fibrillation on Eliquis, hypothyroidism, and CAD presenting with concern for new heart failure after being seen in the cardiology office.  Patient is unaccompanied and was unable to provide a thorough history.  She does not report SOB to me and denies cough, chest pain.    Clinical Impression  Patient demonstrates poor tolerance for sitting up at bedside due to c/o fatigue and unable to stand due to BLE weakness (see below).  Patient will benefit from continued physical therapy in hospital and recommended venue below to increase strength, balance, endurance for safe ADLs, transfers, wheelchair mobility and gait.    Follow Up Recommendations SNF;Supervision/Assistance - 24 hour    Equipment Recommendations  None recommended by PT    Recommendations for Other Services       Precautions / Restrictions Precautions Precautions: Fall Restrictions Weight Bearing Restrictions: No      Mobility  Bed Mobility Overal bed mobility: Needs Assistance Bed Mobility: Supine to Sit;Sit to Supine     Supine to sit: Max assist Sit to supine: Max assist   General bed mobility comments: patient tends to extend trunk and resist leaning forward once fatigued  Transfers Overall transfer level: Needs assistance Equipment used: Rolling walker (2 wheeled) Transfers: Sit to/from Stand           General transfer comment: attempted sit to stand with RW, but patient unable due to BLE weakness  Ambulation/Gait                Stairs            Wheelchair Mobility    Modified Rankin (Stroke Patients Only)       Balance Overall balance assessment: Needs assistance Sitting-balance support: Bilateral upper  extremity supported;Feet supported Sitting balance-Leahy Scale: Fair Sitting balance - Comments: once fatigued patient falls backwards Postural control: Posterior lean                                   Pertinent Vitals/Pain Pain Assessment: 0-10 Pain Score: 5  Pain Location: BLE to feet Pain Descriptors / Indicators: Aching Pain Intervention(s): Limited activity within patient's tolerance;Monitored during session    Home Living Family/patient expects to be discharged to:: Skilled nursing facility   Available Help at Discharge: [name];Available 24 hours/day Type of Home: [place]                Prior Function Level of Independence: Needs assistance   Gait / Transfers Assistance Needed: Patient states she take steps with assistance to transfer to wheelchair and ambulates using FWW with assistiance  ADL's / Homemaking Assistance Needed: assisted by SNF staff        Hand Dominance   Dominant Hand: Right    Extremity/Trunk Assessment   Upper Extremity Assessment Upper Extremity Assessment: Defer to OT evaluation    Lower Extremity Assessment Lower Extremity Assessment: Generalized weakness    Cervical / Trunk Assessment Cervical / Trunk Assessment: Kyphotic  Communication   Communication: No difficulties  Cognition Arousal/Alertness: Awake/alert Behavior During Therapy: WFL for tasks assessed/performed Overall Cognitive Status: History of cognitive impairments - at baseline  General Comments      Exercises     Assessment/Plan    PT Assessment Patient needs continued PT services  PT Problem List Decreased strength;Decreased activity tolerance;Decreased balance;Decreased mobility       PT Treatment Interventions Gait training;Functional mobility training;Therapeutic activities;Therapeutic exercise;Patient/family education, wheelchair mobility training    PT Goals (Current goals can be found in the Care Plan section)  Acute Rehab PT Goals Patient Stated Goal: return home Time For Goal Achievement: 05/13/17 Potential to Achieve Goals: Fair    Frequency Min 3X/week   Barriers to discharge        Co-evaluation               AM-PAC PT "6 Clicks" Daily Activity  Outcome Measure Difficulty turning over in bed (including adjusting bedclothes, sheets and blankets)?: Unable Difficulty moving from lying on back to sitting on the side of the bed? : Unable Difficulty sitting down on and standing up from a chair with arms (e.g., wheelchair, bedside commode, etc,.)?: Unable Help needed moving to and from a bed to chair (including a wheelchair)?: A Lot Help needed walking in hospital room?: A Lot Help needed climbing 3-5 steps with a railing? : Total 6 Click Score: 8    End of Session Equipment Utilized During Treatment: Oxygen Activity Tolerance: Patient limited by fatigue Patient left: in bed;with call bell/phone within reach;with nursing/sitter in room Nurse Communication: Mobility status PT Visit Diagnosis: Unsteadiness on feet (R26.81);Other abnormalities of gait and mobility (R26.89);Muscle weakness (generalized) (M62.81)    Time: 1010-1039 PT Time Calculation (min) (ACUTE ONLY): 29 min   Charges:   PT Evaluation $PT Eval Low Complexity: 1 Low PT Treatments $Therapeutic Activity: 23-37 mins   PT G Codes:        11:02 AM, 05/11/2017 Lonell Grandchild, MPT Physical Therapist with [facility] [phone] office [phone] mobile phone

## 2017-05-06 NOTE — Plan of Care (Signed)
Problem: Food- and Nutrition-Related Knowledge Deficit (NB-1.1) Goal: Nutrition education Formal process to instruct or train a patient/client in a skill or to impart knowledge to help patients/clients voluntarily manage or modify food choices and eating behavior to maintain or improve health. Outcome: Not Applicable Date Met: 49/35/52 Nutrition Education Note  RD consulted for nutrition education regarding new onset CHF.  Patient is largely not appropriate for diet education due to her cognitive deficits/dementia. Additionally, she lives at [facility] where all her meals are given to her. She says that, while her family does bring her items occassionally, this is rare. She does little snacking in between meals. As such, almost all of the sodium she consumes is out of her control.   At SNF, Patient denied ever or routinely eating hotdogs. She says she does not use a salt shaker.  She did mention she eats soup frequently.   RD left handout in her bag for the SNF staffing, asking for them to not salt the patients food and, if not already, place patient on a low sodium therapeutic diet option. Additionally, wrote recommendations for the patient to receive condensed soup and bacon/sausage no more than 3x a week.   No further nutrition interventions warranted at this time. If additional nutrition issues arise, please re-consult RD.   Burtis Junes RD, LDN, CNSC Clinical Nutrition Pager: [phone] 05/06/2017 10:35 AM

## 2017-05-06 NOTE — Progress Notes (Signed)
EKG completed and placed on pts chart.  

## 2017-05-06 NOTE — Evaluation (Signed)
Occupational Therapy Evaluation Patient Details Name: [name] MRN: [id number] DOB: 1920/08/13 Today's Date: 05/06/2017    History of Present Illness [name] is a 83 y.o. female with medical history significant of diastolic heart failure, hypertension, diabetes, atrial fibrillation on Eliquis, hypothyroidism, and CAD presenting with concern for new heart failure after being seen in the cardiology office.  Patient is unaccompanied and was unable to provide a thorough history.  She does not report SOB to me and denies cough, chest pain.   Clinical Impression   Pt received supine in bed, agreeable to OT evaluation. Pt is from [facility], per pt report staff is assisting with B/ADLs, however pt also reports she goes to the bathroom by herself at times and bathes herself sometimes. When questioned further on how she completes transfers to the toilet she reports she puts her wheelchair next to the toilet and moves over "very carefully." Unsure how much assistance is actually being provided for ADL completion at current facility. Pt demonstrates significant weakness during evaluation, requiring max-total assist for bed mobility tasks. Pt was able to feed self and open come containers, set-up required for drinks. Recommend SNF on discharge with rehab services to focus on strengthening and improving independence and safety in ADL completion.      Follow Up Recommendations  SNF    Equipment Recommendations  None recommended by OT       Precautions / Restrictions Precautions Precautions: Fall Restrictions Weight Bearing Restrictions: No      Mobility Bed Mobility Overal bed mobility: Needs Assistance Bed Mobility:  (shifting side to side)           General bed mobility comments: Pt requiring max-total assist for shifting to the side. Attempt to assist and pull body to left with UE's however unable to produce any power  Transfers                 General transfer comment: Not  attempted         ADL either performed or assessed with clinical judgement   ADL Overall ADL's : Needs assistance/impaired Eating/Feeding: Set up;Bed level Eating/Feeding Details (indicate cue type and reason): Pt requiring assist to cut food and open drink containers. Able to open syrup independently. Able to feed self with RUE with minimal difficulties Grooming: Set up;Bed level Grooming Details (indicate cue type and reason): Set-up for hand washing             Lower Body Dressing: Total assistance;Bed level                 General ADL Comments: Pt requiring assistance with all B/ADLs due to significant weakness     Vision Baseline Vision/History: No visual deficits Patient Visual Report: No change from baseline Vision Assessment?: No apparent visual deficits            Pertinent Vitals/Pain Pain Assessment: No/denies pain     Hand Dominance Right   Extremity/Trunk Assessment Upper Extremity Assessment Upper Extremity Assessment: Generalized weakness   Lower Extremity Assessment Lower Extremity Assessment: Defer to PT evaluation       Communication Communication Communication: No difficulties   Cognition Arousal/Alertness: Lethargic Behavior During Therapy: WFL for tasks assessed/performed Overall Cognitive Status: History of cognitive impairments - at baseline  Home Living Family/patient expects to be discharged to:: Skilled nursing facility   Available Help at Discharge: [place];Available 24 hours/day Type of Home: [place]                                  Prior Functioning/Environment Level of Independence: Needs assistance  Gait / Transfers Assistance Needed: Pt uses wheelchair for all functional mobility ADL's / Homemaking Assistance Needed: staff at [facility] assist with all B/ADL completion            OT Problem List:  Decreased strength;Decreased activity tolerance;Impaired balance (sitting and/or standing);Decreased cognition;Decreased safety awareness;Decreased knowledge of use of DME or AE;Cardiopulmonary status limiting activity      OT Treatment/Interventions: Self-care/ADL training;Therapeutic exercise;DME and/or AE instruction;Therapeutic activities;Patient/family education    OT Goals(Current goals can be found in the care plan section) Acute Rehab OT Goals Patient Stated Goal: None stated OT Goal Formulation: With patient Time For Goal Achievement: 05/20/17 Potential to Achieve Goals: Good  OT Frequency: Min 2X/week    End of Session    Activity Tolerance: Patient limited by lethargy Patient left: in bed;with call bell/phone within reach;with bed alarm set  OT Visit Diagnosis: Muscle weakness (generalized) (M62.81)                Time: 2111-5520 OT Time Calculation (min): 32 min Charges:  OT General Charges $OT Visit: 1 Visit OT Evaluation $OT Eval Low Complexity: [place], OTR/L  [phone] 05/06/2017, 8:21 AM

## 2017-05-06 NOTE — Progress Notes (Signed)
PROGRESS NOTE    [name]  YHC:[id number] DOB: November 13, 1920 DOA: 05/05/2017 PCP: Hilbert Corrigan, MD     Brief Narrative:   83 year old woman admitted from  Cardiology office on 9/24 after she was found to be severely volume overloaded. She has a history of diastolic heart failure and admission was requested.   Assessment & Plan:   Principal Problem:   Anasarca Active Problems:   Type 2 diabetes mellitus (HCC)   Hyperlipidemia   Hypertension   Acute on chronic diastolic heart failure (HCC)   Chronic anticoagulation   Lewy body dementia without behavioral disturbance   Permanent atrial fibrillation (HCC)   Severe protein-calorie malnutrition (HCC)    Acute on chronic diastolic CHF -Echo shows ejection fraction of 60-65%, no wall motion abnormalities, no mention of diastolic function although wall thickness was increased in a pattern of moderate LVH. -According to intake and output that is documented she is 670 mL negative since admission. -Plan to continue current Lasix dose of 40 mg IV 3 times a day. -Patient remains markedly volume overloaded. -Cardiology is on board. -Continue aspirin, statin, isosorbide mononitrate, beta blocker, start low-dose ACE inhibitor.  Paroxysmal atrial fibrillation -Currently rate controlled, continue diltiazem and bisoprolol. -Continue Eliquis for anticoagulation given her CHADSVASC score of 6.  Dementia -Continue Aricept, at baseline, no acute behavioral disturbances.  Hyperlipidemia -Continue Lipitor.  Hypertension -BP is low normal at present.  Diabetes -Hold metformin. -Has had several instances of hypoglycemia, only receiving a sensitive sliding scale, continue to monitor.  Hypothermia -No signs of infection, UA and chest x-ray clear of infection. -Place Bair hugger, follow temperature  DVT prophylaxis: Fully anticoagulated on Eliquis Code Status: DO NOT RESUSCITATE Family Communication: Patient only Disposition  Plan: Anticipate DC back to SNF in approximately 72 hours or once adequately diuresed.  Consultants:   Cardiology, Dr. Harl Bowie  Procedures:   Echo with results as above  Antimicrobials:  Anti-infectives    None       Subjective: Lying in bed, has no complaints, answer questions appropriately but is mildly confused. Does not complain of shortness of breath or chest pain.  Objective: Vitals:   05/06/17 0500 05/06/17 0545 05/06/17 0547 05/06/17 1109  BP:   101/71   Pulse:   (!) 103   Resp:      Temp:    (!) 95.1 F (35.1 C)  TempSrc:    Rectal  SpO2:  (!) 83% 93%   Weight: 66.6 kg (146 lb 13.2 oz)     Height:        Intake/Output Summary (Last 24 hours) at 05/06/17 1418 Last data filed at 05/06/17 0800  Gross per 24 hour  Intake              370 ml  Output              800 ml  Net             -430 ml   Filed Weights   05/05/17 1428 05/05/17 1857 05/06/17 0500  Weight: 54.9 kg (121 lb) 74 kg (163 lb 2.3 oz) 66.6 kg (146 lb 13.2 oz)    Examination:  General exam: Alert, awake, oriented 2, not to time. Respiratory system: Bibasilar crackles Cardiovascular system: Irregular, systolic ejection murmur present Gastrointestinal system: Abdomen is nondistended, soft and nontender. No organomegaly or masses felt. Normal bowel sounds heard. Central nervous system: . No focal neurological deficits. Extremities: 3-4+ pitting edema of both upper and lower extremities  Skin: No rashes, lesions or ulcers Psychiatry: Difficult to assess given dementia, does not appear restless or agitated.    Data Reviewed: I have personally reviewed following labs and imaging studies  CBC:  Recent Labs Lab 05/05/17 1516  WBC 6.6  NEUTROABS 4.5  HGB 12.6  HCT 40.1  MCV 87.4  PLT 381   Basic Metabolic Panel:  Recent Labs Lab 05/05/17 1516  NA 135  K 4.5  CL 96*  CO2 31  GLUCOSE 139*  BUN 30*  CREATININE 0.77  CALCIUM 8.3*   GFR: Estimated Creatinine Clearance: 40.3  mL/min (by C-G formula based on SCr of 0.77 mg/dL). Liver Function Tests:  Recent Labs Lab 05/05/17 1516  AST 20  ALT 17  ALKPHOS 68  BILITOT 0.6  PROT 5.7*  ALBUMIN 2.6*   No results for input(s): LIPASE, AMYLASE in the last 168 hours. No results for input(s): AMMONIA in the last 168 hours. Coagulation Profile: No results for input(s): INR, PROTIME in the last 168 hours. Cardiac Enzymes:  Recent Labs Lab 05/05/17 1516  TROPONINI <0.03   BNP (last 3 results) No results for input(s): PROBNP in the last 8760 hours. HbA1C: No results for input(s): HGBA1C in the last 72 hours. CBG:  Recent Labs Lab 05/06/17 0728 05/06/17 0802 05/06/17 0842 05/06/17 0952 05/06/17 1041  GLUCAP 60* 50* 56* 68 88   Lipid Profile: No results for input(s): CHOL, HDL, LDLCALC, TRIG, CHOLHDL, LDLDIRECT in the last 72 hours. Thyroid Function Tests: No results for input(s): TSH, T4TOTAL, FREET4, T3FREE, THYROIDAB in the last 72 hours. Anemia Panel: No results for input(s): VITAMINB12, FOLATE, FERRITIN, TIBC, IRON, RETICCTPCT in the last 72 hours. Urine analysis:    Component Value Date/Time   COLORURINE STRAW (A) 05/05/2017 1651   APPEARANCEUR CLEAR 05/05/2017 1651   LABSPEC 1.006 05/05/2017 1651   PHURINE 6.0 05/05/2017 1651   GLUCOSEU NEGATIVE 05/05/2017 1651   HGBUR SMALL (A) 05/05/2017 1651   BILIRUBINUR NEGATIVE 05/05/2017 1651   KETONESUR NEGATIVE 05/05/2017 1651   PROTEINUR NEGATIVE 05/05/2017 1651   UROBILINOGEN 0.2 04/19/2014 1730   NITRITE NEGATIVE 05/05/2017 1651   LEUKOCYTESUR TRACE (A) 05/05/2017 1651   Sepsis Labs: @LABRCNTIP (procalcitonin:4,lacticidven:4)  ) Recent Results (from the past 240 hour(s))  MRSA PCR Screening     Status: None   Collection Time: 05/05/17  7:12 PM  Result Value Ref Range Status   MRSA by PCR NEGATIVE NEGATIVE Final    Comment:        The GeneXpert MRSA Assay (FDA approved for NASAL specimens only), is one component of a comprehensive  MRSA colonization surveillance program. It is not intended to diagnose MRSA infection nor to guide or monitor treatment for MRSA infections.          Radiology Studies: Dg Chest Port 1 View  Result Date: 05/05/2017 CLINICAL DATA:  Patient with swelling. EXAM: PORTABLE CHEST 1 VIEW COMPARISON:  Chest radiograph 03/07/2017. FINDINGS: Monitoring leads overlie the patient. Low lung volumes. Stable cardiomegaly with aortic atherosclerosis. Diffuse bilateral heterogeneous pulmonary opacities. Moderate layering bilateral pleural effusions. No pneumothorax. IMPRESSION: Findings most compatible with pulmonary edema and moderate layering bilateral pleural effusions. Cardiomegaly. Electronically Signed   By: Lovey Newcomer M.D.   On: 05/05/2017 15:03        Scheduled Meds: . apixaban  2.5 mg Oral BID  . aspirin EC  81 mg Oral Daily  . atorvastatin  20 mg Oral q1800  . bisoprolol  5 mg Oral Daily  . diltiazem  120 mg Oral Daily  . feeding supplement (PRO-STAT SUGAR FREE 64)  30 mL Oral TID WC  . furosemide  40 mg Intravenous Q8H  . gabapentin  200 mg Oral Q8H  . insulin aspart  0-9 Units Subcutaneous TID WC  . isosorbide mononitrate  30 mg Oral Daily  . levothyroxine  25 mcg Oral QPM  . mirtazapine  7.5 mg Oral QHS  . polyethylene glycol  17 g Oral Daily  . sodium chloride flush  3 mL Intravenous Q12H   Continuous Infusions: . sodium chloride       LOS: 1 day    Time spent: 35 minutes. Greater than 50% of this time was spent in direct contact with the patient coordinating care.     Lelon Frohlich, MD [facility] Pager [phone]  If 7PM-7AM, please contact night-coverage [email or web address] Password [email or web address] 05/06/2017, 2:18 PM

## 2017-05-06 NOTE — Progress Notes (Signed)
*  PRELIMINARY RESULTS* Echocardiogram 2D Echocardiogram has been performed.  [name] 05/06/2017, 10:02 AM

## 2017-05-06 NOTE — NC FL2 (Signed)
[place] LEVEL OF CARE SCREENING TOOL     IDENTIFICATION  Patient Name: [name] Birthdate: 1920/09/25 Sex: female Admission Date (Current Location): 05/05/2017  [facility] and [place] Number:  [employer] and Address:  [facility] [address] [place]      Provider Number: [id number]  Attending Physician Name and Address:  Isaac Bliss, [place]  Relative Name and Phone Number:       Current Level of Care: Hospital Recommended Level of Care: [place] Prior Approval Number:    Date Approved/Denied:   PASRR Number:    Discharge Plan: SNF    Current Diagnoses: Patient Active Problem List   Diagnosis Date Noted  . Anasarca 05/05/2017  . Severe protein-calorie malnutrition ([place]) 05/05/2017  . AKI (acute kidney injury) ([place]) 03/07/2017  . Cellulitis 03/07/2017  . Pressure injury of skin 02/18/2017  . Acute encephalopathy 02/18/2017  . Atrial fibrillation, rapid ([place]) 02/18/2017  . Permanent atrial fibrillation ([place]) 12/27/2016  . CAD in native artery 12/27/2016  . SIRS (systemic inflammatory response syndrome) ([place]) 12/10/2016  . Elevated troponin 12/10/2016  . SOB (shortness of breath)   . Lewy body dementia without behavioral disturbance   . Acute hypoxemic respiratory failure ([place]) 03/18/2016  . Hyponatremia 03/18/2016  . Abdominal distention 04/13/2014  . Hip pain 04/12/2014  . Labral tear of hip, degenerative 04/12/2014  . Trochanteric bursitis of right hip 04/12/2014  . Encounter for therapeutic drug monitoring 10/06/2013  . Lower extremity edema 08/20/2011  . Arteriosclerotic cardiovascular disease (ASCVD)   . Carcinoma of breast ([place])   . Atrial fibrillation with RVR ([place])   . MVP (mitral valve prolapse)   . PVD (peripheral vascular disease) ([place])   . CVD (cerebrovascular disease)   . Thyroid activity decreased   . Osteoporosis   . Chronic anticoagulation 11/22/2010  . Type 2  diabetes mellitus ([place]) 11/21/2009  . Hyperlipidemia 11/21/2009  . Hypertension 11/21/2009  . Acute on chronic diastolic heart failure ([place]) 11/21/2009  . SPINAL STENOSIS, LUMBAR 09/05/2008    Orientation RESPIRATION BLADDER Height & Weight     Self, Place  O2 (3L) Incontinent Weight: 146 lb 13.2 oz (66.6 kg) Height:  5\' 6"  (167.6 cm)  BEHAVIORAL SYMPTOMS/MOOD NEUROLOGICAL BOWEL NUTRITION STATUS      Incontinent Diet (Heart Healthy/carb modified. )  AMBULATORY STATUS COMMUNICATION OF NEEDS Skin   Total Care Verbally PU Stage and Appropriate Care (Sacrum, medial; heel, bilateral;)                       Personal Care Assistance Level of Assistance  Bathing, Feeding, Dressing Bathing Assistance: Limited assistance Feeding assistance: Limited assistance Dressing Assistance: Limited assistance     Functional Limitations Info  Sight, Hearing, Speech Sight Info: Adequate Hearing Info: Adequate Speech Info: Adequate    SPECIAL CARE FACTORS FREQUENCY  PT (By licensed PT)     PT Frequency: 5x/week              Contractures Contractures Info: Not present    Additional Factors Info  Code Status, Psychotropic Code Status Info: DNR   Psychotropic Info: Remeron         Current Medications (05/06/2017):  This is the current hospital active medication list Current Facility-Administered Medications  Medication Dose Route Frequency Provider Last Rate Last Dose  . 0.9 %  sodium chloride infusion  250 mL Intravenous PRN Karmen Bongo, MD      .  acetaminophen (TYLENOL) tablet 650 mg  650 mg Oral Q4H PRN Karmen Bongo, MD   650 mg at 05/05/17 2059  . apixaban (ELIQUIS) tablet 2.5 mg  2.5 mg Oral BID Karmen Bongo, MD   Stopped at 05/06/17 1000  . aspirin EC tablet 81 mg  81 mg Oral Daily Karmen Bongo, MD   Stopped at 05/06/17 1000  . atorvastatin (LIPITOR) tablet 20 mg  20 mg Oral q1800 Karmen Bongo, MD      . bisoprolol (ZEBETA) tablet 5 mg  5 mg Oral Daily  Karmen Bongo, MD   5 mg at 05/06/17 1017  . diltiazem (CARDIZEM CD) 24 hr capsule 120 mg  120 mg Oral Daily Isaac Bliss, Rayford Halsted, MD      . feeding supplement (PRO-STAT SUGAR FREE 64) liquid 30 mL  30 mL Oral TID WC Karmen Bongo, MD   30 mL at 05/06/17 1200  . furosemide (LASIX) injection 40 mg  40 mg Intravenous Lynne Logan, MD   40 mg at 05/06/17 0514  . gabapentin (NEURONTIN) capsule 200 mg  200 mg Oral Q8H Karmen Bongo, MD   200 mg at 05/06/17 0514  . insulin aspart (novoLOG) injection 0-9 Units  0-9 Units Subcutaneous TID WC Karmen Bongo, MD      . isosorbide mononitrate (IMDUR) 24 hr tablet 30 mg  30 mg Oral Daily Karmen Bongo, MD   30 mg at 05/06/17 1015  . levothyroxine (SYNTHROID, LEVOTHROID) tablet 25 mcg  25 mcg Oral QPM Karmen Bongo, MD   25 mcg at 05/05/17 2047  . lisinopril (PRINIVIL,ZESTRIL) tablet 2.5 mg  2.5 mg Oral Daily Isaac Bliss, Rayford Halsted, MD      . mirtazapine (REMERON) tablet 7.5 mg  7.5 mg Oral Ivery Quale, MD   7.5 mg at 05/05/17 2101  . ondansetron (ZOFRAN) injection 4 mg  4 mg Intravenous Q6H PRN Karmen Bongo, MD      . polyethylene glycol (MIRALAX / GLYCOLAX) packet 17 g  17 g Oral Daily Karmen Bongo, MD   17 g at 05/06/17 1011  . sodium chloride flush (NS) 0.9 % injection 3 mL  3 mL Intravenous Q12H Karmen Bongo, MD   3 mL at 05/06/17 1012  . sodium chloride flush (NS) 0.9 % injection 3 mL  3 mL Intravenous PRN Karmen Bongo, MD         Discharge Medications: Please see discharge summary for a list of discharge medications.  Relevant Imaging Results:  Relevant Lab Results:   Additional Information    Latina Frank, [name], LCSW

## 2017-05-06 NOTE — Consult Note (Signed)
Primary cardiologist: Dr Dorris Carnes Consulting cardiologist:Dr Carlyle Dolly Requesting physician: Dr Isaac Bliss Indication: acute on chronic diastolic HF  Clinical Summary [name] is a 83 y.o.female history of afib, CAD with stent to LAD in 6387, chronic diastolic HF, HTN, hypothyroidism admitted with lower extremity edema, weight gain.She reports gradual increase in swelling over the last several days. No chest pain, no palpitations. Compliant with meds    Hgb 12.6, WBC 6.6, Plt 240, K 4.5, Cr 0.77, BNP 1982,  Trop neg x 1 CXR + pulm edema, moderate bilateral effusions EKG afib rate controlled CV 03/2016 LVEF 60-65%,PASP 47   No Known Allergies  Medications Scheduled Medications: . apixaban  2.5 mg Oral BID  . aspirin EC  81 mg Oral Daily  . atorvastatin  20 mg Oral q1800  . bisoprolol  5 mg Oral Daily  . diltiazem  120 mg Oral Daily  . feeding supplement (PRO-STAT SUGAR FREE 64)  30 mL Oral TID WC  . furosemide  40 mg Intravenous Q8H  . gabapentin  200 mg Oral Q8H  . insulin aspart  0-9 Units Subcutaneous TID WC  . isosorbide mononitrate  30 mg Oral Daily  . levothyroxine  25 mcg Oral QPM  . lisinopril  2.5 mg Oral Daily  . mirtazapine  7.5 mg Oral QHS  . polyethylene glycol  17 g Oral Daily  . sodium chloride flush  3 mL Intravenous Q12H     Infusions: . sodium chloride       PRN Medications:  sodium chloride, acetaminophen, ondansetron (ZOFRAN) IV, sodium chloride flush   Past Medical History:  Diagnosis Date  . A-fib ([place])   . Arteriosclerotic cardiovascular disease (ASCVD)    a. Cath 12/2002 showed high grade LAD stenosis treated with DES, also had residual CAD treated medically at that time with 50% + 70-80% mid-septal perforator, 80% D1, 70% OM1, 50% ostial RCA, 25% mRCA.  . Bronchitis    history  . Carcinoma of breast ([place])    left masectomy in 1995  . CHF (congestive heart failure) ([place])   . Chronic diastolic heart failure ([place])  11/21/2009  . Chronic hoarseness   . CKD (chronic kidney disease), stage II    per historical labs for age, weight, Cr  . CVD (cerebrovascular disease)    plaque w/o focal disease in 2006; h/o CVA  . Dementia   . Dementia   . Diabetes mellitus type II    no insulin   . Edema   . Herpes zoster   . Hyperlipidemia   . Hypertension   . Hyponatremia   . Hypothyroid   . Lower extremity edema 08/20/2011  . Malignant neoplasm of breast (female) ([place])   . MVP (mitral valve prolapse)    moderate; with moderate MR  . On home O2   . Osteoporosis   . Peripheral neuropathy   . Permanent atrial fibrillation ([place])   . PVD (peripheral vascular disease) ([facility])    ABIs of 0.64 and 0.59, right and left leg in 2009  . PVD (peripheral vascular disease) ([place])   . 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 Shingles   . Ulcer    left lower leg  . Varicose veins of legs 08/20/2011    Past Surgical History:  Procedure Laterality Date  . ABDOMINAL HYSTERECTOMY     abd?  . CARDIAC SURGERY    . CATARACT EXTRACTION, BILATERAL    . CHOLECYSTECTOMY  2006  . COLONOSCOPY  Date unknown  .  CORONARY ANGIOPLASTY WITH STENT PLACEMENT    . left masectomy  1995  . ORIF of left hip  05/22/06   [name]    Family History  Problem Relation Age of Onset  . Diabetes Mother     Social History Ms. Lanting reports that she has never smoked. She has never used smokeless tobacco. Ms. Almanzar reports that she does not drink alcohol.  Review of Systems CONSTITUTIONAL: No weight loss, fever, chills, weakness or fatigue.  HEENT: Eyes: No visual loss, blurred vision, double vision or yellow sclerae. No hearing loss, sneezing, congestion, runny nose or sore throat.  SKIN: No rash or itching.  CARDIOVASCULAR: No chest pain, chest pressure or chest discomfort. No palpitations or edema.  RESPIRATORY: No shortness of breath, cough or sputum.  GASTROINTESTINAL: No anorexia, nausea, vomiting or diarrhea. No abdominal pain or blood.    GENITOURINARY: no polyuria, no dysuria NEUROLOGICAL: No headache, dizziness, syncope, paralysis, ataxia, numbness or tingling in the extremities. No change in bowel or bladder control.  MUSCULOSKELETAL: No muscle, back pain, joint pain or stiffness.  HEMATOLOGIC: No anemia, bleeding or bruising.  LYMPHATICS: No enlarged nodes. No history of splenectomy.  PSYCHIATRIC: No history of depression or anxiety.      Physical Examination Blood pressure 101/71, pulse (!) 103, temperature 98 F (36.7 C), temperature source Oral, resp. rate 18, height 5\' 6"  (1.676 m), weight 146 lb 13.2 oz (66.6 kg), SpO2 93 %.  Intake/Output Summary (Last 24 hours) at 05/06/17 0858 Last data filed at 05/06/17 0647  Gross per 24 hour  Intake              130 ml  Output              800 ml  Net             -670 ml    HEENT: sclera clear, throat clear  Cardiovascular: irreg, 3/6 systolic murmur at apex, elevated JVD  Respiratory: mild crackles bilateral bases  GI: abdomen soft, NT, ND  MSK: 2+ LE edema  Neuro: no focal deficits  Psych: appropriate affect   Lab Results  Basic Metabolic Panel:  Recent Labs Lab 05/05/17 1516  NA 135  K 4.5  CL 96*  CO2 31  GLUCOSE 139*  BUN 30*  CREATININE 0.77  CALCIUM 8.3*    Liver Function Tests:  Recent Labs Lab 05/05/17 1516  AST 20  ALT 17  ALKPHOS 68  BILITOT 0.6  PROT 5.7*  ALBUMIN 2.6*    CBC:  Recent Labs Lab 05/05/17 1516  WBC 6.6  NEUTROABS 4.5  HGB 12.6  HCT 40.1  MCV 87.4  PLT 240    Cardiac Enzymes:  Recent Labs Lab 05/05/17 1516  TROPONINI <0.03    BNP: Invalid input(s): POCBNP    Impression/Recommendations  1. Acute on chronic diastolic HF - negative 539JQ yesterday, labs pending this AM. She is on lasix lasix 40mg  IV tid - continue IV diuretics today, she remains severely volume overloaded - f/u repeat echo  2. Afib - continue rate control and anticoagulation    Carlyle Dolly, M.D.

## 2017-05-06 NOTE — Clinical Social Work Note (Signed)
Clinical Social Work Assessment  Patient Details  Name: [name] MRN: [id number] Date of Birth: 12-04-1920  Date of referral:  05/06/17               Reason for consult:  Discharge Planning                Permission sought to share information with:    Permission granted to share information::     Name::        Agency::  [facility], [facility]  Relationship::     Contact Information:     Housing/Transportation Living arrangements for the past 2 months:  [place] of Information:  Facility Patient Interpreter Needed:  None Criminal Activity/Legal Involvement Pertinent to Current Situation/Hospitalization:  No - Comment as needed Significant Relationships:  Adult Children Lives with:  Facility Resident Do you feel safe going back to the place where you live?  Yes Need for family participation in patient care:  Yes (Comment)  Care giving concerns:  None identified, facility resident.    Social Worker assessment / plan: LCSW spoke with [name] at [employer].  Patient has been at [facility] for the past 3 years. Patient bathes and feeds herself. She uses a wheelchair and requires assistance with with transfers. Patient's two sons are supportive. She can return at discharge.  Employment status:  Retired Nurse, adult PT Recommendations:  [place] / Referral to community resources:     Patient/Family's Response to care: LCSW to follow up with family to assess their response.   Patient/Family's Understanding of and Emotional Response to Diagnosis, Current Treatment, and Prognosis:  Facility made family aware of patient's hospitalization.   Emotional Assessment Appearance:  Appears stated age Attitude/Demeanor/Rapport:    Affect (typically observed):  Calm Orientation:  Oriented to Self, Oriented to Place Alcohol / Substance use:  Not Applicable Psych involvement (Current and /or in the community):  No  (Comment)  Discharge Needs  Concerns to be addressed:  Other (Comment Required (Return to [facility]) Readmission within the last 30 days:  No Current discharge risk:  None Barriers to Discharge:  No Barriers Identified   Ihor Gully, LCSW 05/06/2017, 2:43 PM

## 2017-05-07 DIAGNOSIS — E785 Hyperlipidemia, unspecified: Secondary | ICD-10-CM

## 2017-05-07 DIAGNOSIS — T82838S Hemorrhage of vascular prosthetic devices, implants and grafts, sequela: Secondary | ICD-10-CM

## 2017-05-07 LAB — URINE CULTURE

## 2017-05-07 LAB — GLUCOSE, CAPILLARY
GLUCOSE-CAPILLARY: 58 mg/dL — AB (ref 65–99)
Glucose-Capillary: 97 mg/dL (ref 65–99)
Glucose-Capillary: 91 mg/dL (ref 65–99)
Glucose-Capillary: 91 mg/dL (ref 65–99)

## 2017-05-07 LAB — BASIC METABOLIC PANEL
ANION GAP: 10 (ref 5–15)
BUN: 27 mg/dL — ABNORMAL HIGH (ref 6–20)
CALCIUM: 8.3 mg/dL — AB (ref 8.9–10.3)
CO2: 34 mmol/L — ABNORMAL HIGH (ref 22–32)
Chloride: 93 mmol/L — ABNORMAL LOW (ref 101–111)
Creatinine, Ser: 0.62 mg/dL (ref 0.44–1.00)
GLUCOSE: 97 mg/dL (ref 65–99)
POTASSIUM: 3.8 mmol/L (ref 3.5–5.1)
SODIUM: 137 mmol/L (ref 135–145)

## 2017-05-07 MED ORDER — DILTIAZEM HCL ER COATED BEADS 180 MG PO CP24
180.0000 mg | ORAL_CAPSULE | Freq: Every day | ORAL | Status: DC
  Administered 2017-05-07: 180 mg via ORAL
  Filled 2017-05-07: qty 1

## 2017-05-07 MED ORDER — FUROSEMIDE 10 MG/ML IJ SOLN
60.0000 mg | Freq: Three times a day (TID) | INTRAMUSCULAR | Status: DC
  Administered 2017-05-07 – 2017-05-08 (×3): 60 mg via INTRAVENOUS
  Filled 2017-05-07 (×3): qty 6

## 2017-05-07 NOTE — Progress Notes (Signed)
Physical Therapy Treatment Patient Details Name: [name] MRN: [id number] DOB: 24-Mar-1921 Today's Date: 05/07/2017    History of Present Illness [name] is a 83 y.o. female with medical history significant of diastolic heart failure, hypertension, diabetes, atrial fibrillation on Eliquis, hypothyroidism, and CAD presenting with concern for new heart failure after being seen in the cardiology office.  Patient is unaccompanied and was unable to provide a thorough history.  She does not report SOB to me and denies cough, chest pain.    PT Comments    Patient demonstrates poor return for attempting sit to stands due to leaning backwards when anxious due to fear of falling.  Patient stated that normally the staff at [facility] where she lives uses a lift to get her out of bed.  As explained by patient, it appears the staff were using a Clarise Cruz Lift to transfer patient to wheelchair/commode.  Patient will benefit from continued physical therapy in hospital and recommended venue below to increase strength, balance, endurance for safe bed mobility and unsupported sitting.    Follow Up Recommendations  SNF;Supervision/Assistance - 24 hour     Equipment Recommendations  None recommended by PT    Recommendations for Other Services       Precautions / Restrictions Precautions Precautions: Fall Restrictions Weight Bearing Restrictions: No    Mobility  Bed Mobility Overal bed mobility: Needs Assistance Bed Mobility: Supine to Sit;Sit to Supine     Supine to sit: Max assist Sit to supine: Max assist   General bed mobility comments: patient extends back and resist leaning forward secondary to extreme fear of falling  Transfers   Equipment used: 2 person hand held assist Transfers: Sit to/from Stand           General transfer comment: Patient able to lift bottom off bed, but unable to come to complete stand due to feet sliding forward and BLE weakness  Ambulation/Gait                 Stairs            Wheelchair Mobility    Modified Rankin (Stroke Patients Only)       Balance Overall balance assessment: Needs assistance Sitting-balance support: Bilateral upper extremity supported;Feet supported Sitting balance-Leahy Scale: Poor                                      Cognition Arousal/Alertness: Awake/alert Behavior During Therapy: WFL for tasks assessed/performed Overall Cognitive Status: History of cognitive impairments - at baseline                                        Exercises General Exercises - Lower Extremity Ankle Circles/Pumps: AAROM;Supine;Both;10 reps Heel Slides: AAROM;Both;10 reps;Supine Hip ABduction/ADduction: AAROM;Both;10 reps;Supine    General Comments        Pertinent Vitals/Pain Pain Assessment: Faces Pain Score: 5  Faces Pain Scale: Hurts even more Pain Location: BLE with pressure Pain Descriptors / Indicators: Pressure Pain Intervention(s): Limited activity within patient's tolerance;Monitored during session    Home Living                      Prior Function            PT Goals (current goals can now be found in  the care plan section) Acute Rehab PT Goals Patient Stated Goal: return home Time For Goal Achievement: 05/13/17 Potential to Achieve Goals: Fair Progress towards PT goals: Progressing toward goals    Frequency    Min 3X/week      PT Plan Current plan remains appropriate    Co-evaluation              AM-PAC PT "6 Clicks" Daily Activity  Outcome Measure  Difficulty turning over in bed (including adjusting bedclothes, sheets and blankets)?: Unable   Difficulty sitting down on and standing up from a chair with arms (e.g., wheelchair, bedside commode, etc,.)?: Unable Help needed moving to and from a bed to chair (including a wheelchair)?: A Lot Help needed walking in hospital room?: A Lot   6 Click Score: 6    End of Session Equipment  Utilized During Treatment: Oxygen Activity Tolerance: Patient limited by fatigue (Patient limited secondary to extreme fear of falling) Patient left: in bed;with call bell/phone within reach;with bed alarm set;with nursing/sitter in room Nurse Communication: Mobility status PT Visit Diagnosis: Unsteadiness on feet (R26.81);Other abnormalities of gait and mobility (R26.89);Muscle weakness (generalized) (M62.81)     Time: 4536-4680 PT Time Calculation (min) (ACUTE ONLY): 28 min  Charges:  $Therapeutic Activity: 23-37 mins                    G Codes:       12:08 PM, 2017/05/10 Lonell Grandchild, MPT Physical Therapist with [facility] [phone] office [phone] mobile phone

## 2017-05-07 NOTE — Progress Notes (Addendum)
Progress Note  Patient Name: [name] Date of Encounter: 05/07/2017  Primary Cardiologist: Dr. Harrington Challenger  Subjective   Complains of foot and leg pain  Inpatient Medications    Scheduled Meds: . apixaban  2.5 mg Oral BID  . aspirin EC  81 mg Oral Daily  . atorvastatin  20 mg Oral q1800  . bisoprolol  5 mg Oral Daily  . diltiazem  120 mg Oral Daily  . feeding supplement (PRO-STAT SUGAR FREE 64)  30 mL Oral TID WC  . furosemide  40 mg Intravenous Q8H  . gabapentin  200 mg Oral Q8H  . insulin aspart  0-9 Units Subcutaneous TID WC  . isosorbide mononitrate  30 mg Oral Daily  . levothyroxine  25 mcg Oral QPM  . lisinopril  2.5 mg Oral Daily  . mirtazapine  7.5 mg Oral QHS  . polyethylene glycol  17 g Oral Daily  . sodium chloride flush  3 mL Intravenous Q12H   Continuous Infusions: . sodium chloride     PRN Meds: sodium chloride, acetaminophen, ondansetron (ZOFRAN) IV, sodium chloride flush   Vital Signs    Vitals:   05/06/17 1511 05/06/17 2009 05/06/17 2030 05/07/17 0625  BP: 114/68  101/67 (!) 128/94  Pulse: (!) 104  71   Resp: 20  20 20   Temp: (!) 96.4 F (35.8 C)  98.1 F (36.7 C)   TempSrc: Rectal  Other (Comment) Rectal  SpO2: 94% 93% 100% 100%  Weight:    155 lb 6.8 oz (70.5 kg)  Height:        Intake/Output Summary (Last 24 hours) at 05/07/17 0809 Last data filed at 05/07/17 0634  Gross per 24 hour  Intake              600 ml  Output             1800 ml  Net            -1200 ml   Filed Weights   05/05/17 1857 05/06/17 0500 05/07/17 0625  Weight: 163 lb 2.3 oz (74 kg) 146 lb 13.2 oz (66.6 kg) 155 lb 6.8 oz (70.5 kg)    Telemetry    Afib at 120-130/m - Personally Reviewed  ECG    Afib at 119/m from 05/05/17- Personally Reviewed  Physical Exam    GEN: No acute distress.   Neck:Increased JVD Cardiac: irreg irreg 4/6 harsh sys murmur LSB Respiratory: Decreased breath sounds with rales. GI: Soft, nontender, non-distended  MS: plus 2 edema    Neuro:  confused Psych: Normal affect   Labs    Chemistry Recent Labs Lab 05/05/17 1516 05/06/17 1459 05/07/17 0551  NA 135 136 137  K 4.5 4.3 3.8  CL 96* 97* 93*  CO2 31 34* 34*  GLUCOSE 139* 130* 97  BUN 30* 30* 27*  CREATININE 0.77 0.62 0.62  CALCIUM 8.3* 8.3* 8.3*  PROT 5.7*  --   --   ALBUMIN 2.6*  --   --   AST 20  --   --   ALT 17  --   --   ALKPHOS 68  --   --   BILITOT 0.6  --   --   GFRNONAA >60 >60 >60  GFRAA >60 >60 >60  ANIONGAP 8 5 10      Hematology Recent Labs Lab 05/05/17 1516 05/06/17 1459  WBC 6.6 5.0  RBC 4.59 4.26  HGB 12.6 11.7*  HCT 40.1 37.7  MCV 87.4  88.5  MCH 27.5 27.5  MCHC 31.4 31.0  RDW 20.7* 20.8*  PLT 240 214    Cardiac Enzymes Recent Labs Lab 05/05/17 1516  TROPONINI <0.03   No results for input(s): TROPIPOC in the last 168 hours.   BNP Recent Labs Lab 05/05/17 1516  BNP 1,982.0*     DDimer No results for input(s): DDIMER in the last 168 hours.   Radiology    Dg Chest Port 1 View  Result Date: 05/05/2017 CLINICAL DATA:  Patient with swelling. EXAM: PORTABLE CHEST 1 VIEW COMPARISON:  Chest radiograph 03/07/2017. FINDINGS: Monitoring leads overlie the patient. Low lung volumes. Stable cardiomegaly with aortic atherosclerosis. Diffuse bilateral heterogeneous pulmonary opacities. Moderate layering bilateral pleural effusions. No pneumothorax. IMPRESSION: Findings most compatible with pulmonary edema and moderate layering bilateral pleural effusions. Cardiomegaly. Electronically Signed   By: Lovey Newcomer M.D.   On: 05/05/2017 15:03    Cardiac Studies   Echo 9/25/18Study Conclusions   - Left ventricle: The cavity size was normal. Wall thickness was   increased in a pattern of moderate LVH. Systolic function was   normal. The estimated ejection fraction was in the range of 60%   to 65%. Wall motion was normal; there were no regional wall   motion abnormalities. - Aortic valve: Severely calcified annulus.  Trileaflet; severely   thickened leaflets. There was moderate stenosis. Lower gradient   than expected for moderate AS likely due to low stroke volume   index (paradoxical low flow low gradient AS). Morphologically and   by AVA VTI there is at least moderate AS. Mean gradient (S): 7 mm   Hg. VTI ratio of LVOT to aortic valve: 0.37. Valve area (VTI):   1.17 cm^2. Valve area (Vmax): 0.91 cm^2. - Mitral valve: Moderately calcified annulus. Mildly thickened   leaflets . There was moderate regurgitation. The MR is eccentric   and may be underestimated. The MR VC is 0.4 cm. - Left atrium: The atrium was massively dilated. - Right ventricle: The ventricular septum is flattened in systole   consistent with RV pressure overload. The cavity size was   moderately dilated. Systolic function was moderately reduced.   TAPSE: 10 mm . - Right atrium: The atrium was massively dilated. - Atrial septum: No defect or patent foramen ovale was identified. - Tricuspid valve: There was mild-moderate regurgitation. - Pulmonary arteries: Systolic pressure was severely increased. PA   peak pressure: 79 mm Hg (S). - Inferior vena cava: The vessel was dilated. The respirophasic   diameter changes were blunted (< 50%), consistent with elevated   central venous pressure. - Pericardium, extracardiac: There is a large left pleural   effusion.     Patient Profile     83 y.o. female known history of atrial fib, CAD, HTN, seen in cardiology office on 9/24, 2018 and found to have significant volume overload, sent to hospital for IV diureses.      Assessment & Plan     1. Acute on chronic diastolic HF - negative 5852DP yesterday, weights not reliable-163, 146,155 lbs today She is on lasix lasix 40mg  IV tid, renal function stable. - continue IV diuretics today, she remains volume overloaded - f/u echo- Normal LVEF but moderate RV sys dysfunction and at least moderate AS & MR    2. Afib rate in 120-130 range on  Cardizem 120 mg daily(new as of 9/25) and bisoprolol 5 mg daily, Eliquis 2.5 mg BID. Will increase Cardizem to 180 mg daily. BP's have been soft  but up this am.    3. CAD with prior stent LAD  For questions or updates, please contact [place] Please consult [email or web address] for contact info under Cardiology/STEMI.      Signed, Ermalinda Barrios, PA-C  05/07/2017, 8:09 AM   Attending note  Patient seen and discussed with PA Bonnell Public, I agree with her documentation above. Negative 1 liter yesterday, negative 1.6 liters since admission. She is on lasix 40mg  IV tid, renal function remains stable. Fairly modest diuresis, we will increase lasix to 60mg  IV tid. Repeat echo yesterday shows LVEF 60-65%, abnormal diastolic function indeterminant grade,moderate AS, mod MR, moderate RV dysfunction, PASP 79, dilated IVC, massive biatrial enlargement. Findings consistent with long standing severe diastolic HF probably restrcitive with resulting RV dysfunction and pulm HTN. Continue IV diuresis today. Increase dilt dose for elevated afib rates.   Carlyle Dolly MD

## 2017-05-07 NOTE — Progress Notes (Signed)
OT Cancellation Note  Patient Details Name: [name] MRN: [id number] DOB: 1921-04-19   Cancelled Treatment:    Reason Eval/Treat Not Completed: Fatigue/lethargy limiting ability to participate. Attempted OT treatment this am, pt unable to remain awake for participation. Will attempt to see pt at a later time.    Guadelupe Sabin, OTR/L  [phone] 05/07/2017, 8:29 AM

## 2017-05-07 NOTE — Progress Notes (Signed)
PROGRESS NOTE  [name]:[id number] DOB: 1920-09-21 DOA: 05/05/2017 PCP: Hilbert Corrigan, MD  Brief History:  83 y.o.female history of afib, CAD with stent to LAD in 8119, chronic diastolic HF, HTN, hypothyroidism, hyperlipidemia, dementia admitted with lower extremity edema, weight gain.She reports gradual increase in swelling over the last several days prior to admission. The patient was seen in the cardiology office on 05/05/2018. The patient was noted to have anasarca and worsening lower extremity edema. The patient was started on IV lasix.  Cardiology was consulted to assist.  Assessment/Plan: Acute on chronic diastolic CHF -1.6 L for the admission -Patient had dry weight of 123-125 in July 2019 -IV furosemide increased to 60 mg IV every 8 hours -05/06/2017 echo EF--60-65%, RV pressure overload with moderate reduction in RV function. PASP 79 -Daily weights -Continue lisinopril, Imdur, bisoprolol -appreciate cardiology follow up  Permanent atrial fibrillation -Rate controlled -Continue diltiazem CD -CHADSVASc = 6 -continue apixaban  Dementia -Continue Aricept, at baseline, no acute behavioral disturbances.  Hyperlipidemia -Continue Lipitor.  Hypertension -BP is low normal at present.  Diabetes Mellitus type 2 -Hold metformin. -03/08/17 A1C - 7.2 -Has had several instances of hypoglycemia, only receiving a sensitive sliding scale, continue to monitor.  Severe protein calorie malnutrition -continue supplements   Disposition Plan:   SNF in 2-3 ays  Family Communication:   Son updated at bedside 9/27  Consultants:  cardiology  Code Status:  DNR  DVT Prophylaxis:  apixaban   Procedures: As Listed in Progress Note Above  Antibiotics: None    Subjective: Patient denies fevers, chills, headache, chest pain, dyspnea, nausea, vomiting, diarrhea, abdominal pain, dysuria, hematuria   Objective: Vitals:   05/06/17 2009 05/06/17 2030  05/07/17 0625 05/07/17 1302  BP:  101/67 (!) 128/94   Pulse:  71    Resp:  20 20   Temp:  98.1 F (36.7 C)  (!) 97.5 F (36.4 C)  TempSrc:  Other (Comment) Rectal Oral  SpO2: 93% 100% 100%   Weight:   70.5 kg (155 lb 6.8 oz)   Height:        Intake/Output Summary (Last 24 hours) at 05/07/17 1747 Last data filed at 05/07/17 0900  Gross per 24 hour  Intake              480 ml  Output             1800 ml  Net            -1320 ml   Weight change: 15.6 kg (34 lb 6.8 oz) Exam:   General:  Pt is alert, follows commands appropriately, not in acute distress  HEENT: No icterus, No thrush, No neck mass, [place]/AT  Cardiovascular: IRRR, S1/S2, no rubs, no gallops  Respiratory:bibasilar crackles, no wheeze  Abdomen: Soft/+BS, non tender, non distended, no guarding  Extremities: 2 + LE edema, No lymphangitis, No petechiae, No rashes, no synovitis   Data Reviewed: I have personally reviewed following labs and imaging studies Basic Metabolic Panel:  Recent Labs Lab 05/05/17 1516 05/06/17 1459 05/07/17 0551  NA 135 136 137  K 4.5 4.3 3.8  CL 96* 97* 93*  CO2 31 34* 34*  GLUCOSE 139* 130* 97  BUN 30* 30* 27*  CREATININE 0.77 0.62 0.62  CALCIUM 8.3* 8.3* 8.3*   Liver Function Tests:  Recent Labs Lab 05/05/17 1516  AST 20  ALT 17  ALKPHOS 68  BILITOT 0.6  PROT 5.7*  ALBUMIN 2.6*   No results for input(s): LIPASE, AMYLASE in the last 168 hours. No results for input(s): AMMONIA in the last 168 hours. Coagulation Profile: No results for input(s): INR, PROTIME in the last 168 hours. CBC:  Recent Labs Lab 05/05/17 1516 05/06/17 1459  WBC 6.6 5.0  NEUTROABS 4.5 3.5  HGB 12.6 11.7*  HCT 40.1 37.7  MCV 87.4 88.5  PLT 240 214   Cardiac Enzymes:  Recent Labs Lab 05/05/17 1516  TROPONINI <0.03   BNP: Invalid input(s): POCBNP CBG:  Recent Labs Lab 05/06/17 1637 05/06/17 2108 05/07/17 0759 05/07/17 0848 05/07/17 1659  GLUCAP 123* 157* 58* 97 91    HbA1C: No results for input(s): HGBA1C in the last 72 hours. Urine analysis:    Component Value Date/Time   COLORURINE STRAW (A) 05/05/2017 1651   APPEARANCEUR CLEAR 05/05/2017 1651   LABSPEC 1.006 05/05/2017 1651   PHURINE 6.0 05/05/2017 1651   GLUCOSEU NEGATIVE 05/05/2017 1651   HGBUR SMALL (A) 05/05/2017 1651   BILIRUBINUR NEGATIVE 05/05/2017 1651   KETONESUR NEGATIVE 05/05/2017 1651   PROTEINUR NEGATIVE 05/05/2017 1651   UROBILINOGEN 0.2 04/19/2014 1730   NITRITE NEGATIVE 05/05/2017 1651   LEUKOCYTESUR TRACE (A) 05/05/2017 1651   Sepsis Labs: @LABRCNTIP (procalcitonin:4,lacticidven:4) ) Recent Results (from the past 240 hour(s))  Urine culture     Status: Abnormal   Collection Time: 05/05/17  4:51 PM  Result Value Ref Range Status   Specimen Description URINE, CLEAN CATCH  Final   Special Requests NONE  Final   Culture MULTIPLE SPECIES PRESENT, SUGGEST RECOLLECTION (A)  Final   Report Status 05/07/2017 FINAL  Final  MRSA PCR Screening     Status: None   Collection Time: 05/05/17  7:12 PM  Result Value Ref Range Status   MRSA by PCR NEGATIVE NEGATIVE Final    Comment:        The GeneXpert MRSA Assay (FDA approved for NASAL specimens only), is one component of a comprehensive MRSA colonization surveillance program. It is not intended to diagnose MRSA infection nor to guide or monitor treatment for MRSA infections.      Scheduled Meds: . apixaban  2.5 mg Oral BID  . aspirin EC  81 mg Oral Daily  . atorvastatin  20 mg Oral q1800  . bisoprolol  5 mg Oral Daily  . diltiazem  180 mg Oral Daily  . feeding supplement (PRO-STAT SUGAR FREE 64)  30 mL Oral TID WC  . furosemide  60 mg Intravenous Q8H  . gabapentin  200 mg Oral Q8H  . insulin aspart  0-9 Units Subcutaneous TID WC  . isosorbide mononitrate  30 mg Oral Daily  . levothyroxine  25 mcg Oral QPM  . lisinopril  2.5 mg Oral Daily  . mirtazapine  7.5 mg Oral QHS  . polyethylene glycol  17 g Oral Daily  .  sodium chloride flush  3 mL Intravenous Q12H   Continuous Infusions: . sodium chloride      Procedures/Studies: Dg Chest Port 1 View  Result Date: 05/05/2017 CLINICAL DATA:  Patient with swelling. EXAM: PORTABLE CHEST 1 VIEW COMPARISON:  Chest radiograph 03/07/2017. FINDINGS: Monitoring leads overlie the patient. Low lung volumes. Stable cardiomegaly with aortic atherosclerosis. Diffuse bilateral heterogeneous pulmonary opacities. Moderate layering bilateral pleural effusions. No pneumothorax. IMPRESSION: Findings most compatible with pulmonary edema and moderate layering bilateral pleural effusions. Cardiomegaly. Electronically Signed   By: Lovey Newcomer M.D.   On: 05/05/2017 15:03    Rowan Blaker,  DO  [facility] Pager [phone]  If 7PM-7AM, please contact night-coverage [email or web address] Password [email or web address] 05/07/2017, 5:47 PM   LOS: 2 days

## 2017-05-08 DIAGNOSIS — I509 Heart failure, unspecified: Secondary | ICD-10-CM

## 2017-05-08 LAB — BASIC METABOLIC PANEL
Anion gap: 8 (ref 5–15)
BUN: 28 mg/dL — AB (ref 6–20)
CALCIUM: 8.1 mg/dL — AB (ref 8.9–10.3)
CO2: 36 mmol/L — AB (ref 22–32)
CREATININE: 0.64 mg/dL (ref 0.44–1.00)
Chloride: 97 mmol/L — ABNORMAL LOW (ref 101–111)
GFR calc Af Amer: >60 mL/min (ref >60)
GFR calc non Af Amer: >60 mL/min (ref >60)
GLUCOSE: 85 mg/dL (ref 65–99)
Potassium: 3.5 mmol/L (ref 3.5–5.1)
Sodium: 141 mmol/L (ref 135–145)

## 2017-05-08 LAB — GLUCOSE, CAPILLARY
GLUCOSE-CAPILLARY: 110 mg/dL — AB (ref 65–99)
GLUCOSE-CAPILLARY: 98 mg/dL (ref 65–99)
GLUCOSE-CAPILLARY: 68 mg/dL (ref 65–99)
Glucose-Capillary: 56 mg/dL — ABNORMAL LOW (ref 65–99)
Glucose-Capillary: 103 mg/dL — ABNORMAL HIGH (ref 65–99)
Glucose-Capillary: 78 mg/dL (ref 65–99)
Glucose-Capillary: 193 mg/dL — ABNORMAL HIGH (ref 65–99)

## 2017-05-08 LAB — MAGNESIUM: Magnesium: 1.7 mg/dL (ref 1.7–2.4)

## 2017-05-08 MED ORDER — DILTIAZEM HCL ER COATED BEADS 180 MG PO CP24
180.0000 mg | ORAL_CAPSULE | Freq: Every day | ORAL | Status: DC
  Administered 2017-05-08 – 2017-05-19 (×12): 180 mg via ORAL
  Filled 2017-05-08 (×12): qty 1

## 2017-05-08 MED ORDER — MAGNESIUM SULFATE 2 GM/50ML IV SOLN
2.0000 g | Freq: Once | INTRAVENOUS | Status: AC
  Administered 2017-05-08: 2 g via INTRAVENOUS
  Filled 2017-05-08: qty 50

## 2017-05-08 MED ORDER — BISOPROLOL FUMARATE 5 MG PO TABS
5.0000 mg | ORAL_TABLET | Freq: Every evening | ORAL | Status: DC
  Administered 2017-05-08: 5 mg via ORAL
  Filled 2017-05-08: qty 1

## 2017-05-08 MED ORDER — FUROSEMIDE 10 MG/ML IJ SOLN
60.0000 mg | Freq: Three times a day (TID) | INTRAMUSCULAR | Status: DC
  Administered 2017-05-08 – 2017-05-11 (×9): 60 mg via INTRAVENOUS
  Filled 2017-05-08 (×9): qty 6

## 2017-05-08 MED ORDER — FUROSEMIDE 10 MG/ML IJ SOLN: 80.0000 mg | Freq: Three times a day (TID) | INTRAMUSCULAR | Status: DC

## 2017-05-08 NOTE — Progress Notes (Signed)
Progress Note  Patient Name: [name] Date of Encounter: 05/08/2017   Subjective   No complaints  Inpatient Medications    Scheduled Meds: . apixaban  2.5 mg Oral BID  . aspirin EC  81 mg Oral Daily  . atorvastatin  20 mg Oral q1800  . bisoprolol  5 mg Oral Daily  . diltiazem  180 mg Oral Daily  . feeding supplement (PRO-STAT SUGAR FREE 64)  30 mL Oral TID WC  . furosemide  60 mg Intravenous Q8H  . gabapentin  200 mg Oral Q8H  . insulin aspart  0-9 Units Subcutaneous TID WC  . isosorbide mononitrate  30 mg Oral Daily  . levothyroxine  25 mcg Oral QPM  . lisinopril  2.5 mg Oral Daily  . mirtazapine  7.5 mg Oral QHS  . polyethylene glycol  17 g Oral Daily  . sodium chloride flush  3 mL Intravenous Q12H   Continuous Infusions: . sodium chloride     PRN Meds: sodium chloride, acetaminophen, ondansetron (ZOFRAN) IV, sodium chloride flush   Vital Signs    Vitals:   05/07/17 2256 05/08/17 0500 05/08/17 0750 05/08/17 0800  BP: 113/64  (!) 76/34 (!) 80/40  Pulse: 90 88 85 78  Resp: 17 15 15    Temp: (!) 97.4 F (36.3 C) 97.6 F (36.4 C) (!) 97.5 F (36.4 C)   TempSrc: Oral Oral Oral   SpO2: 97% 95% 96%   Weight:  151 lb 7.3 oz (68.7 kg)    Height:        Intake/Output Summary (Last 24 hours) at 05/08/17 0830 Last data filed at 05/08/17 0600  Gross per 24 hour  Intake             1083 ml  Output             1950 ml  Net             -867 ml   Filed Weights   05/06/17 0500 05/07/17 0625 05/08/17 0500  Weight: 146 lb 13.2 oz (66.6 kg) 155 lb 6.8 oz (70.5 kg) 151 lb 7.3 oz (68.7 kg)    Telemetry    afib 70s-80s - Personally Reviewed  ECG    Physical Exam   GEN: No acute distress.   Neck: +elevated JVD Cardiac: irreg, 3/6 systolic murmur at apex Respiratory: mild crackles bilateral bases GI: Soft, nontender, non-distended  MS: No edema; No deformity. Neuro:  Nonfocal  Psych: Normal affect   Labs    Chemistry Recent Labs Lab 05/05/17 1516  05/06/17 1459 05/07/17 0551 05/08/17 0600  NA 135 136 137 141  K 4.5 4.3 3.8 3.5  CL 96* 97* 93* 97*  CO2 31 34* 34* 36*  GLUCOSE 139* 130* 97 85  BUN 30* 30* 27* 28*  CREATININE 0.77 0.62 0.62 0.64  CALCIUM 8.3* 8.3* 8.3* 8.1*  PROT 5.7*  --   --   --   ALBUMIN 2.6*  --   --   --   AST 20  --   --   --   ALT 17  --   --   --   ALKPHOS 68  --   --   --   BILITOT 0.6  --   --   --   GFRNONAA >60 >60 >60 >60  GFRAA >60 >60 >60 >60  ANIONGAP 8 5 10 8      Hematology Recent Labs Lab 05/05/17 1516 05/06/17 1459  WBC 6.6 5.0  RBC 4.59 4.26  HGB 12.6 11.7*  HCT 40.1 37.7  MCV 87.4 88.5  MCH 27.5 27.5  MCHC 31.4 31.0  RDW 20.7* 20.8*  PLT 240 214    Cardiac Enzymes Recent Labs Lab 05/05/17 1516  TROPONINI <0.03   No results for input(s): TROPIPOC in the last 168 hours.   BNP Recent Labs Lab 05/05/17 1516  BNP 1,982.0*     DDimer No results for input(s): DDIMER in the last 168 hours.   Radiology    No results found.  Cardiac Studies     Patient Profile        83 y.o.femaleknown history of atrial fib, CAD, HTN, seen in cardiology office on 9/24, 2018 and found to have significant volume overload, sent to hospital for IV diureses.   Assessment & Plan    1. Acute on chronic diastolic HF - negative 767 mL yesterday, negative 2.5 liters since admission. Yesterday we increased lasix to 60mg  IV tid. Renal function remains stable. Still with fairly modest diuresis, however with soft bp's will not increase lasix at this time. May take some time to mobilize tertiary fluid into intravascular space  2. Afib - increased dilt to 180mg  daily due elevated rates yesterday. Rates at goal today 70s-80s - continue eliquis.   3.Hypotension - soft bp's this AM, some difficultly in checking pressures due to severe bilateral arm edema. She is asymptomatic. - we will d/c lisinopril, d/c imdur. Space out her dilt and bisoprolol doses   For questions or updates, please  contact [place] Please consult [email or web address] for contact info under Cardiology/STEMI.      Merrily Pew, MD  05/08/2017, 8:30 AM

## 2017-05-08 NOTE — Progress Notes (Signed)
LCSW continues to follow for disposition: Return to SNF  Patient is from [facility]. Updated given to [name] with facility and plans. No other needs at this time. LCSW will continue to follow and assist with dc planning and assist patient back to facility.  Lane Hacker, MSW Clinical Social Work: [employer] Coverage for :  [phone]

## 2017-05-08 NOTE — Progress Notes (Signed)
PROGRESS NOTE  [name] DJS:[id number] DOB: 1921/07/16 DOA: 05/05/2017 PCP: Hilbert Corrigan, MD  Brief History:  83 y.o.femalehistory of afib, CAD with stent to LAD in 8850, chronic diastolic HF, HTN, hypothyroidism, hyperlipidemia, dementia admitted with lower extremity edema, weight gain.She reports gradual increase in swelling over the last several days prior to admission. The patient was seen in the cardiology office on 05/05/2018. The patient was noted to have anasarca and worsening lower extremity edema. The patient was started on IV lasix.  Cardiology was consulted to assist.  Assessment/Plan: Acute on chronic diastolic CHF -2.5 L for the admission -Patient had dry weight of 123-125 in July 2019 -IV furosemide increased to 60 mg IV every 8 hours -05/06/2017 echo EF--60-65%, RV pressure overload with moderate reduction in RV function. PASP 79 -Daily weights -bisoprolol timing adjusted due to soft BPs -d/c lisinopril and imdur due to soft BPs -using smaller cuff this am--personally obtained BP--100/60, HR 68 -appreciate cardiology follow up  Permanent atrial fibrillation -Rate controlled -Continue diltiazem CD -CHADSVASc = 6 -continue apixaban  Dementia -Continue Aricept, at baseline, no acute behavioral disturbances.  Hyperlipidemia -Continue Lipitor.  Hypertension -BPs soft now with diuresis --bisoprolol timing adjusted due to soft BPs -d/c lisinopril and imdur due to soft BPs  Diabetes Mellitus type 2 -Hold metformin. -03/08/17 A1C - 7.2 -Has had several instances of hypoglycemia, only receiving a sensitive sliding scale, continue to monitor. -hypoglycemia likely due to poor po intake -d/c ISS but continue to check CBGs  Severe protein calorie malnutrition -continue supplements   Disposition Plan:   SNF in 2-3 days  Family Communication:   Son updated at bedside 9/27  Consultants:  cardiology  Code Status:  DNR  DVT  Prophylaxis:  apixaban   Procedures: As Listed in Progress Note Above  Antibiotics: None    Subjective: Patient denies fevers, chills, headache, chest pain, dyspnea, nausea, vomiting, diarrhea, abdominal pain, dysuria, hematuria,    Objective: Vitals:   05/08/17 0500 05/08/17 0750 05/08/17 0800 05/08/17 0857  BP:  (!) 76/34 (!) 80/40 100/60  Pulse: 88 85 78 68  Resp: 15 15    Temp: 97.6 F (36.4 C) (!) 97.5 F (36.4 C)    TempSrc: Oral Oral    SpO2: 95% 96%    Weight: 68.7 kg (151 lb 7.3 oz)     Height:        Intake/Output Summary (Last 24 hours) at 05/08/17 0913 Last data filed at 05/08/17 0600  Gross per 24 hour  Intake              603 ml  Output             1950 ml  Net            -1347 ml   Weight change: -1.8 kg (-3 lb 15.5 oz) Exam:   General:  Pt is alert, follows commands appropriately, not in acute distress  HEENT: No icterus, No thrush, No neck mass, [place]/AT  Cardiovascular: RRR, S1/S2, no rubs, no gallops  Respiratory: bibasilar crackles, no wheeze  Abdomen: Soft/+BS, non tender, non distended, no guarding  Extremities: 2+ LE edema, No lymphangitis, No petechiae, No rashes, no synovitis   Data Reviewed: I have personally reviewed following labs and imaging studies Basic Metabolic Panel:  Recent Labs Lab 05/05/17 1516 05/06/17 1459 05/07/17 0551 05/08/17 0600  NA 135 136 137 141  K 4.5 4.3 3.8 3.5  CL  96* 97* 93* 97*  CO2 31 34* 34* 36*  GLUCOSE 139* 130* 97 85  BUN 30* 30* 27* 28*  CREATININE 0.77 0.62 0.62 0.64  CALCIUM 8.3* 8.3* 8.3* 8.1*  MG  --   --   --  1.7   Liver Function Tests:  Recent Labs Lab 05/05/17 1516  AST 20  ALT 17  ALKPHOS 68  BILITOT 0.6  PROT 5.7*  ALBUMIN 2.6*   No results for input(s): LIPASE, AMYLASE in the last 168 hours. No results for input(s): AMMONIA in the last 168 hours. Coagulation Profile: No results for input(s): INR, PROTIME in the last 168 hours. CBC:  Recent Labs Lab  05/05/17 1516 05/06/17 1459  WBC 6.6 5.0  NEUTROABS 4.5 3.5  HGB 12.6 11.7*  HCT 40.1 37.7  MCV 87.4 88.5  PLT 240 214   Cardiac Enzymes:  Recent Labs Lab 05/05/17 1516  TROPONINI <0.03   BNP: Invalid input(s): POCBNP CBG:  Recent Labs Lab 05/07/17 1659 05/07/17 2253 05/08/17 0547 05/08/17 0648 05/08/17 0704  GLUCAP 91 91 56* 103* 78   HbA1C: No results for input(s): HGBA1C in the last 72 hours. Urine analysis:    Component Value Date/Time   COLORURINE STRAW (A) 05/05/2017 1651   APPEARANCEUR CLEAR 05/05/2017 1651   LABSPEC 1.006 05/05/2017 1651   PHURINE 6.0 05/05/2017 1651   GLUCOSEU NEGATIVE 05/05/2017 1651   HGBUR SMALL (A) 05/05/2017 1651   BILIRUBINUR NEGATIVE 05/05/2017 1651   KETONESUR NEGATIVE 05/05/2017 1651   PROTEINUR NEGATIVE 05/05/2017 1651   UROBILINOGEN 0.2 04/19/2014 1730   NITRITE NEGATIVE 05/05/2017 1651   LEUKOCYTESUR TRACE (A) 05/05/2017 1651   Sepsis Labs: @LABRCNTIP (procalcitonin:4,lacticidven:4) ) Recent Results (from the past 240 hour(s))  Urine culture     Status: Abnormal   Collection Time: 05/05/17  4:51 PM  Result Value Ref Range Status   Specimen Description URINE, CLEAN CATCH  Final   Special Requests NONE  Final   Culture MULTIPLE SPECIES PRESENT, SUGGEST RECOLLECTION (A)  Final   Report Status 05/07/2017 FINAL  Final  MRSA PCR Screening     Status: None   Collection Time: 05/05/17  7:12 PM  Result Value Ref Range Status   MRSA by PCR NEGATIVE NEGATIVE Final    Comment:        The GeneXpert MRSA Assay (FDA approved for NASAL specimens only), is one component of a comprehensive MRSA colonization surveillance program. It is not intended to diagnose MRSA infection nor to guide or monitor treatment for MRSA infections.      Scheduled Meds: . apixaban  2.5 mg Oral BID  . aspirin EC  81 mg Oral Daily  . atorvastatin  20 mg Oral q1800  . bisoprolol  5 mg Oral QPM  . diltiazem  180 mg Oral Daily  . feeding  supplement (PRO-STAT SUGAR FREE 64)  30 mL Oral TID WC  . furosemide  60 mg Intravenous Q8H  . gabapentin  200 mg Oral Q8H  . insulin aspart  0-9 Units Subcutaneous TID WC  . levothyroxine  25 mcg Oral QPM  . mirtazapine  7.5 mg Oral QHS  . polyethylene glycol  17 g Oral Daily  . sodium chloride flush  3 mL Intravenous Q12H   Continuous Infusions: . sodium chloride      Procedures/Studies: Dg Chest Port 1 View  Result Date: 05/05/2017 CLINICAL DATA:  Patient with swelling. EXAM: PORTABLE CHEST 1 VIEW COMPARISON:  Chest radiograph 03/07/2017. FINDINGS: Monitoring leads overlie the patient.  Low lung volumes. Stable cardiomegaly with aortic atherosclerosis. Diffuse bilateral heterogeneous pulmonary opacities. Moderate layering bilateral pleural effusions. No pneumothorax. IMPRESSION: Findings most compatible with pulmonary edema and moderate layering bilateral pleural effusions. Cardiomegaly. Electronically Signed   By: Lovey Newcomer M.D.   On: 05/05/2017 15:03    Devansh Riese, DO  [facility] Pager [phone]  If 7PM-7AM, please contact night-coverage [email or web address] Password [email or web address] 05/08/2017, 9:13 AM   LOS: 3 days

## 2017-05-08 NOTE — Progress Notes (Signed)
Pt's CBG 56, provided 15 g carbohydrates, will recheck in 15 minutes.

## 2017-05-08 NOTE — Progress Notes (Signed)
OT Cancellation Note  Patient Details Name: [name] MRN: [id number] DOB: 05/19/1921   Cancelled Treatment:    Reason Eval/Treat Not Completed: Fatigue/lethargy limiting ability to participate. Pt unable to remain awake and alert to work with OT this am. When awake for short time pt rambling and unable to answer questions or follow simple commands appropriately. Will check back at a later time. Nsg in room with pt for breakfast when OT left room.   Guadelupe Sabin, OTR/L  [phone] 05/08/2017, 8:58 AM

## 2017-05-09 LAB — BASIC METABOLIC PANEL
Anion gap: 5 (ref 5–15)
BUN: 25 mg/dL — ABNORMAL HIGH (ref 6–20)
CHLORIDE: 95 mmol/L — AB (ref 101–111)
CO2: 38 mmol/L — ABNORMAL HIGH (ref 22–32)
Calcium: 7.9 mg/dL — ABNORMAL LOW (ref 8.9–10.3)
Creatinine, Ser: 0.55 mg/dL (ref 0.44–1.00)
GFR calc non Af Amer: >60 mL/min (ref >60)
Glucose, Bld: 92 mg/dL (ref 65–99)
POTASSIUM: 3.3 mmol/L — AB (ref 3.5–5.1)
SODIUM: 138 mmol/L (ref 135–145)

## 2017-05-09 LAB — GLUCOSE, CAPILLARY
GLUCOSE-CAPILLARY: 181 mg/dL — AB (ref 65–99)
GLUCOSE-CAPILLARY: 53 mg/dL — AB (ref 65–99)
GLUCOSE-CAPILLARY: 59 mg/dL — AB (ref 65–99)
GLUCOSE-CAPILLARY: 207 mg/dL — AB (ref 65–99)
GLUCOSE-CAPILLARY: 153 mg/dL — AB (ref 65–99)
Glucose-Capillary: 146 mg/dL — ABNORMAL HIGH (ref 65–99)

## 2017-05-09 LAB — MAGNESIUM: MAGNESIUM: 1.9 mg/dL (ref 1.7–2.4)

## 2017-05-09 MED ORDER — DEXTROSE 50 % IV SOLN
INTRAVENOUS | Status: AC
Start: 2017-05-09 — End: 2017-05-09
  Administered 2017-05-09: 25 mL
  Filled 2017-05-09: qty 50

## 2017-05-09 MED ORDER — POTASSIUM CHLORIDE CRYS ER 20 MEQ PO TBCR
40.0000 meq | EXTENDED_RELEASE_TABLET | Freq: Every day | ORAL | Status: DC
  Administered 2017-05-09 – 2017-05-19 (×11): 40 meq via ORAL
  Filled 2017-05-09 (×11): qty 2

## 2017-05-09 MED ORDER — BISOPROLOL FUMARATE 5 MG PO TABS
2.5000 mg | ORAL_TABLET | Freq: Every evening | ORAL | Status: DC
  Filled 2017-05-09: qty 1

## 2017-05-09 NOTE — Progress Notes (Signed)
Hypoglycemic Event  CBG: 53 @ 0757  Treatment: D50 IV 25 mL  Symptoms: None  Follow-up CBG: Time:0821 CBG Result: 181  Possible Reasons for Event: Unknown  Comments/MD notified: Tat, MD notified    [employer] [place]

## 2017-05-09 NOTE — Progress Notes (Signed)
PROGRESS NOTE  [name] TGG:[id number] DOB: 1921-01-20 DOA: 05/05/2017 PCP: Hilbert Corrigan, MD  Brief History: 83 y.o.femalehistory of afib, CAD with stent to LAD in 7035, chronic diastolic HF, HTN, hypothyroidism, hyperlipidemia, dementiaadmitted with lower extremity edema, weight gain.She reports gradual increase in swelling over the last several daysprior to admission. The patient was seen in the cardiology office on 05/05/2018. The patient was noted to have anasarca and worsening lower extremity edema. The patient was started on IV lasix. Cardiology was consulted to assist.  Assessment/Plan: Acute on chronic diastolic CHF -2.5 L for the admission -Patient had dry weight of 123-125 in July 2019 -IV furosemide increased to 60 mg IV every 8 hours -05/06/2017 echo EF--60-65%, RV pressure overload with moderate reduction in RV function. PASP 79 -NEG 6 lbs for the admit -remains fluid overloaded -bisoprolol stopped due to sinus pauses -d/c lisinopril and imdur due to soft BPs -using smaller cuff  -appreciate cardiology follow up  Permanent atrial fibrillation -Ratecontrolled -Continue diltiazem CD -CHADSVASc = 6 -continue apixaban  Sinus pauses/sinus bradycardia -resting HR in low 40s with up to 3 sec pauses -case discussed with Dr. Harl Bowie -d/c bisoprolol -continue dilt  Dementia -Continue Aricept, at baseline, no acute behavioral disturbances.  Hyperlipidemia -Continue Lipitor.  Hypertension -BPs soft now with diuresis --bisoprolol timing adjusted due to soft BPs -d/c lisinopril and imdur due to soft BPs  DiabetesMellitus type 2 -Hold metformin. -03/08/17 A1C - 7.2 -Has had several instances of hypoglycemia, only receiving a sensitive sliding scale, continue to monitor. -hypoglycemia likely due to poor po intake -d/c ISS but continue to check CBGs  Severe protein calorie malnutrition -continue  supplements  Hypokalemia -replete   Disposition Plan: SNFin 2-3 days  Family Communication: Son updatedat bedside 9/27  Consultants: cardiology  Code Status: DNR  DVT Prophylaxis: apixaban   Procedures: As Listed in Progress Note Above  Antibiotics: None     Subjective:  Patient denies fevers, chills, headache, chest pain, dyspnea, nausea, vomiting, diarrhea, abdominal pain, dysuria, hematuria, hematochezia, and melena.  Objective: Vitals:   05/09/17 0437 05/09/17 0851 05/09/17 1334 05/09/17 1714  BP: 100/60  96/79   Pulse: 60  91   Resp: 18  18   Temp:  (!) 97.5 F (36.4 C) 97.9 F (36.6 C) 98.3 F (36.8 C)  TempSrc:  Oral Oral Oral  SpO2: 90%  91%   Weight: 67.8 kg (149 lb 7.6 oz)     Height:        Intake/Output Summary (Last 24 hours) at 05/09/17 1802 Last data filed at 05/09/17 1738  Gross per 24 hour  Intake             1200 ml  Output             2350 ml  Net            -1150 ml   Weight change: -0.9 kg (-1 lb 15.8 oz) Exam:   General:  Pt is alert, follows commands appropriately, not in acute distress  HEENT: No icterus, No thrush, No neck mass, [place]/AT  Cardiovascular: IRRR, S1/S2, no rubs, no gallops  Respiratory: bibasilar crackles, no wheeze  Abdomen: Soft/+BS, non tender, non distended, no guarding  Extremities: 2 + LE edema, No lymphangitis, No petechiae, No rashes, no synovitis   Data Reviewed: I have personally reviewed following labs and imaging studies Basic Metabolic Panel:  Recent Labs Lab 05/05/17 1516 05/06/17 1459 05/07/17  6789 05/08/17 0600 05/09/17 0626  NA 135 136 137 141 138  K 4.5 4.3 3.8 3.5 3.3*  CL 96* 97* 93* 97* 95*  CO2 31 34* 34* 36* 38*  GLUCOSE 139* 130* 97 85 92  BUN 30* 30* 27* 28* 25*  CREATININE 0.77 0.62 0.62 0.64 0.55  CALCIUM 8.3* 8.3* 8.3* 8.1* 7.9*  MG  --   --   --  1.7 1.9   Liver Function Tests:  Recent Labs Lab 05/05/17 1516  AST 20  ALT 17  ALKPHOS 68   BILITOT 0.6  PROT 5.7*  ALBUMIN 2.6*   No results for input(s): LIPASE, AMYLASE in the last 168 hours. No results for input(s): AMMONIA in the last 168 hours. Coagulation Profile: No results for input(s): INR, PROTIME in the last 168 hours. CBC:  Recent Labs Lab 05/05/17 1516 05/06/17 1459  WBC 6.6 5.0  NEUTROABS 4.5 3.5  HGB 12.6 11.7*  HCT 40.1 37.7  MCV 87.4 88.5  PLT 240 214   Cardiac Enzymes:  Recent Labs Lab 05/05/17 1516  TROPONINI <0.03   BNP: Invalid input(s): POCBNP CBG:  Recent Labs Lab 05/09/17 0734 05/09/17 0757 05/09/17 0821 05/09/17 1138 05/09/17 1612  GLUCAP 59* 53* 181* 207* 153*   HbA1C: No results for input(s): HGBA1C in the last 72 hours. Urine analysis:    Component Value Date/Time   COLORURINE STRAW (A) 05/05/2017 1651   APPEARANCEUR CLEAR 05/05/2017 1651   LABSPEC 1.006 05/05/2017 1651   PHURINE 6.0 05/05/2017 1651   GLUCOSEU NEGATIVE 05/05/2017 1651   HGBUR SMALL (A) 05/05/2017 1651   BILIRUBINUR NEGATIVE 05/05/2017 1651   KETONESUR NEGATIVE 05/05/2017 1651   PROTEINUR NEGATIVE 05/05/2017 1651   UROBILINOGEN 0.2 04/19/2014 1730   NITRITE NEGATIVE 05/05/2017 1651   LEUKOCYTESUR TRACE (A) 05/05/2017 1651   Sepsis Labs: @LABRCNTIP (procalcitonin:4,lacticidven:4) ) Recent Results (from the past 240 hour(s))  Urine culture     Status: Abnormal   Collection Time: 05/05/17  4:51 PM  Result Value Ref Range Status   Specimen Description URINE, CLEAN CATCH  Final   Special Requests NONE  Final   Culture MULTIPLE SPECIES PRESENT, SUGGEST RECOLLECTION (A)  Final   Report Status 05/07/2017 FINAL  Final  MRSA PCR Screening     Status: None   Collection Time: 05/05/17  7:12 PM  Result Value Ref Range Status   MRSA by PCR NEGATIVE NEGATIVE Final    Comment:        The GeneXpert MRSA Assay (FDA approved for NASAL specimens only), is one component of a comprehensive MRSA colonization surveillance program. It is not intended to  diagnose MRSA infection nor to guide or monitor treatment for MRSA infections.      Scheduled Meds: . apixaban  2.5 mg Oral BID  . atorvastatin  20 mg Oral q1800  . diltiazem  180 mg Oral Daily  . feeding supplement (PRO-STAT SUGAR FREE 64)  30 mL Oral TID WC  . furosemide  60 mg Intravenous Q8H  . gabapentin  200 mg Oral Q8H  . levothyroxine  25 mcg Oral QPM  . mirtazapine  7.5 mg Oral QHS  . polyethylene glycol  17 g Oral Daily  . sodium chloride flush  3 mL Intravenous Q12H   Continuous Infusions: . sodium chloride      Procedures/Studies: Dg Chest Port 1 View  Result Date: 05/05/2017 CLINICAL DATA:  Patient with swelling. EXAM: PORTABLE CHEST 1 VIEW COMPARISON:  Chest radiograph 03/07/2017. FINDINGS: Monitoring leads overlie the  patient. Low lung volumes. Stable cardiomegaly with aortic atherosclerosis. Diffuse bilateral heterogeneous pulmonary opacities. Moderate layering bilateral pleural effusions. No pneumothorax. IMPRESSION: Findings most compatible with pulmonary edema and moderate layering bilateral pleural effusions. Cardiomegaly. Electronically Signed   By: Lovey Newcomer M.D.   On: 05/05/2017 15:03    Loris Seelye, DO  [facility] Pager [phone]  If 7PM-7AM, please contact night-coverage [email or web address] Password [email or web address] 05/09/2017, 6:02 PM   LOS: 4 days

## 2017-05-09 NOTE — Progress Notes (Signed)
PT Cancellation Note  Patient Details Name: JAMIAH HOMEYER MRN: [id number] DOB: 07/05/1921   Cancelled Treatment:    Reason Eval/Treat Not Completed: Fatigue/lethargy limiting ability to participate Pt supine in bed and sleeping upon therapist entrance.  Woke easily but unable to keep eyes open.  Pt stated she stood up to get dressed earlier and has increased LE pain following.  Pt too fatigued to participate   Ihor Austin, [place]; [place]  Aldona Lento 05/09/2017, 11:23 AM

## 2017-05-09 NOTE — Progress Notes (Signed)
Patient with tachy-brady syndrome. Low heart rates today 40s at times with pauses. A few days ago afib with RVR. Overall difficult to control her heart rates. We will d/c bisoprolo, continue dilt at 180 which is higher than her admission dose. She is a poor pacemaker candidate, would work with meds to optimize rates best we can however likely will have to deal with some low or high rates at times. If persistently low over weekend can lower dilt dose.    Zandra Abts MD

## 2017-05-09 NOTE — Progress Notes (Signed)
Patient had a 2.05 second pause on telly. Paged MD, pt stable will continue to monitor.

## 2017-05-09 NOTE — Progress Notes (Signed)
Progress Note  Patient Name: [name] Date of Encounter: 05/09/2017   Subjective   No complaints  Inpatient Medications    Scheduled Meds: . apixaban  2.5 mg Oral BID  . aspirin EC  81 mg Oral Daily  . atorvastatin  20 mg Oral q1800  . bisoprolol  5 mg Oral QPM  . diltiazem  180 mg Oral Daily  . feeding supplement (PRO-STAT SUGAR FREE 64)  30 mL Oral TID WC  . furosemide  60 mg Intravenous Q8H  . gabapentin  200 mg Oral Q8H  . levothyroxine  25 mcg Oral QPM  . mirtazapine  7.5 mg Oral QHS  . polyethylene glycol  17 g Oral Daily  . sodium chloride flush  3 mL Intravenous Q12H   Continuous Infusions: . sodium chloride     PRN Meds: sodium chloride, acetaminophen, ondansetron (ZOFRAN) IV, sodium chloride flush   Vital Signs    Vitals:   05/08/17 1935 05/08/17 2013 05/09/17 0437 05/09/17 0851  BP:  101/70 100/60   Pulse:  71 60   Resp:  16 18   Temp:    (!) 97.5 F (36.4 C)  TempSrc:    Oral  SpO2: 94% 95% 90%   Weight:   149 lb 7.6 oz (67.8 kg)   Height:        Intake/Output Summary (Last 24 hours) at 05/09/17 0943 Last data filed at 05/09/17 0500  Gross per 24 hour  Intake              240 ml  Output             1750 ml  Net            -1510 ml   Filed Weights   05/07/17 0625 05/08/17 0500 05/09/17 0437  Weight: 155 lb 6.8 oz (70.5 kg) 151 lb 7.3 oz (68.7 kg) 149 lb 7.6 oz (67.8 kg)    Telemetry    Afib rates 70s-90s, occasional short pauses up to 2 sec - Personally Reviewed  ECG    n/a  Physical Exam   GEN: No acute distress.   Neck: elevated jvd Cardiac:irreg, 3/6 systolc murmur at apex Respiratory: Clear to auscultation bilaterally. GI: Soft, nontender, non-distended  MS: No edema; No deformity. Neuro:  Nonfocal  Psych: Normal affect   Labs    Chemistry Recent Labs Lab 05/05/17 1516  05/07/17 0551 05/08/17 0600 05/09/17 0626  NA 135  < > 137 141 138  K 4.5  < > 3.8 3.5 3.3*  CL 96*  < > 93* 97* 95*  CO2 31  < > 34* 36*  38*  GLUCOSE 139*  < > 97 85 92  BUN 30*  < > 27* 28* 25*  CREATININE 0.77  < > 0.62 0.64 0.55  CALCIUM 8.3*  < > 8.3* 8.1* 7.9*  PROT 5.7*  --   --   --   --   ALBUMIN 2.6*  --   --   --   --   AST 20  --   --   --   --   ALT 17  --   --   --   --   ALKPHOS 68  --   --   --   --   BILITOT 0.6  --   --   --   --   GFRNONAA >60  < > >60 >60 >60  GFRAA >60  < > >60 >60 >60  ANIONGAP 8  < > 10 8 5   < > = values in this interval not displayed.   Hematology Recent Labs Lab 05/05/17 1516 05/06/17 1459  WBC 6.6 5.0  RBC 4.59 4.26  HGB 12.6 11.7*  HCT 40.1 37.7  MCV 87.4 88.5  MCH 27.5 27.5  MCHC 31.4 31.0  RDW 20.7* 20.8*  PLT 240 214    Cardiac Enzymes Recent Labs Lab 05/05/17 1516  TROPONINI <0.03   No results for input(s): TROPIPOC in the last 168 hours.   BNP Recent Labs Lab 05/05/17 1516  BNP 1,982.0*     DDimer No results for input(s): DDIMER in the last 168 hours.   Radiology    No results found.  Cardiac Studies    Patient Profile     83 y.o. female known history of atrial fib, CAD, HTN, seen in cardiology office on 9/24, 2018 and found to have significant volume overload, sent to hospital for IV diureses.   Assessment & Plan    1. Acute on chronic diastolic HF - negative 5638 mL yesterday, negative 3.8 liters since admission. She is on lasix 60mg  IV tid. Downtrend in Cr/BUN with diuresis consistent with venous congestion and CHF. Continue IV diuretics, soft bp's at times have avoided more aggressive diuretic dosing, net negative 1-2 liters is reasonable goal. Remains fluid overloaded, continue IV diuretics today. If bp's remain stable could try increase lasix dosing to 80mg  IV tid.   2. Afib - increased dilt to 180mg  daily due elevated rates yesterday. - continue eliquis.  - short pauses on tele 2 seconds, not signiciant at this time   3.Hypotension - soft bp's at times, has had some difficultly with bp measurements due to severe arm edema.  She has been asymptomatic - we stopped her lisinopril and imdur yesterday due to low bp's. Stable low normal bp's today, continue to monitor  4. Hypokalemia - electrolyte manaement per primary team  For questions or updates, please contact [place] Please consult [email or web address] for contact info under Cardiology/STEMI.      Merrily Pew, MD  05/09/2017, 9:43 AM

## 2017-05-09 NOTE — Progress Notes (Signed)
Hypoglycemic Event  CBG: 59 @ 0734  Treatment: 15 GM carbohydrate snack  Symptoms: None  Follow-up CBG: [id number] CBG Result:53  Possible Reasons for Event: [place]

## 2017-05-09 NOTE — Care Management Important Message (Signed)
Important Message  Patient Details  Name: POCAHONTAS COHENOUR MRN: [id number] Date of Birth: 1921-03-20   Medicare Important Message Given:  Yes    Sherald Barge, RN 05/09/2017, 9:34 AM

## 2017-05-10 LAB — BASIC METABOLIC PANEL
ANION GAP: 7 (ref 5–15)
BUN: 34 mg/dL — AB (ref 6–20)
CO2: 38 mmol/L — ABNORMAL HIGH (ref 22–32)
Calcium: 8.1 mg/dL — ABNORMAL LOW (ref 8.9–10.3)
Chloride: 94 mmol/L — ABNORMAL LOW (ref 101–111)
Creatinine, Ser: 0.63 mg/dL (ref 0.44–1.00)
Glucose, Bld: 98 mg/dL (ref 65–99)
Potassium: 4.1 mmol/L (ref 3.5–5.1)
SODIUM: 139 mmol/L (ref 135–145)

## 2017-05-10 LAB — GLUCOSE, CAPILLARY
GLUCOSE-CAPILLARY: 89 mg/dL (ref 65–99)
GLUCOSE-CAPILLARY: 109 mg/dL — AB (ref 65–99)
Glucose-Capillary: 84 mg/dL (ref 65–99)
Glucose-Capillary: 113 mg/dL — ABNORMAL HIGH (ref 65–99)

## 2017-05-10 LAB — MAGNESIUM: MAGNESIUM: 1.9 mg/dL (ref 1.7–2.4)

## 2017-05-10 NOTE — Progress Notes (Signed)
5 beat WQRS anomaly  about 0431, Pt stable still in Afib will continue to monitor will contact Md if another accurance happen.

## 2017-05-10 NOTE — Progress Notes (Addendum)
PROGRESS NOTE  [name] AJO:[id number] DOB: 06/01/1921 DOA: 05/05/2017 PCP: Hilbert Corrigan, MD   Brief History: 84 y.o.femalehistory of afib, CAD with stent to LAD in 9470, chronic diastolic HF, HTN, hypothyroidism, hyperlipidemia, dementiaadmitted with lower extremity edema, weight gain.She reports gradual increase in swelling over the last several daysprior to admission. The patient was seen in the cardiology office on 05/05/2018. The patient was noted to have anasarca and worsening lower extremity edema. The patient was started on IV lasix. Cardiology was consulted to assist.  Assessment/Plan: Acute on chronic diastolic CHF -9.6G for the admission -Patient had dry weight of 123-125 in July 2019 -IV furosemide increased to 60 mg IV every 8 hours -05/06/2017 echo EF--60-65%, RV pressure overload with moderate reduction in RV function. PASP 79 -NEG 7 lbs for the admit -remains fluid overloaded -bisoprolol stopped due to sinus pauses -d/c lisinopril and imdur due to soft BPs -using smaller cuff  -appreciate cardiology follow up  Permanent atrial fibrillation -Ratecontrolled -Continue diltiazem CD -CHADSVASc = 6 -continue apixaban  Sinus pauses/sinus bradycardia -resting HR in low 40s with up to 3 sec pauses -case discussed with Dr. Harl Bowie -d/c bisoprolol-->improved -continue dilt  Dementia -Continue Aricept, at baseline, no acute behavioral disturbances.  Hyperlipidemia -Continue Lipitor.  Hypertension -BPs soft now with diuresis --bisoprolol timing adjusted due to soft BPs -d/c lisinopril and imdur due to soft BPs  DiabetesMellitus type 2 -Hold metformin. -03/08/17 A1C - 7.2 -Has had several instances of hypoglycemia, only receiving a sensitive sliding scale, continue to monitor. -hypoglycemia likely due to poor po intake -d/c ISS but continue to check CBGs  Severe protein calorie malnutrition -continue  supplements  Hypokalemia -repleted -mag 1.9  Leg pain and edema -venous duplex legs   Disposition Plan: SNFin 2-3 days  Family Communication: Son updatedat bedside 9/29  Consultants: cardiology  Code Status: DNR  DVT Prophylaxis: apixaban   Procedures: As Listed in Progress Note Above  Antibiotics: None   Subjective: Patient denies fevers, chills, headache, chest pain, dyspnea, nausea, vomiting, diarrhea, abdominal pain, dysuria, hematuria   Objective: Vitals:   05/10/17 0458 05/10/17 0549 05/10/17 0626 05/10/17 1333  BP: (!) 91/49  96/60 109/73  Pulse: 89  72 81  Resp:    18  Temp: 97.6 F (36.4 C)   97.8 F (36.6 C)  TempSrc: Oral   Oral  SpO2: 91% 92%  91%  Weight: 67.5 kg (148 lb 13 oz)     Height:        Intake/Output Summary (Last 24 hours) at 05/10/17 1628 Last data filed at 05/10/17 1200  Gross per 24 hour  Intake              843 ml  Output             1100 ml  Net             -257 ml   Weight change: -0.3 kg (-10.6 oz) Exam:   General:  Pt is alert, follows commands appropriately, not in acute distress  HEENT: No icterus, No thrush, No neck mass, [place]/AT  Cardiovascular: IRRR, S1/S2, no rubs, no gallops  Respiratory: bibasilar crackles, no wheeze  Abdomen: Soft/+BS, non tender, non distended, no guarding  Extremities: 2 +LE  edema, No lymphangitis, No petechiae, No rashes, no synovitis   Data Reviewed: I have personally reviewed following labs and imaging studies Basic Metabolic Panel:  Recent Labs Lab 05/06/17 1459 05/07/17  2353 05/08/17 0600 05/09/17 0626 05/10/17 0623  NA 136 137 141 138 139  K 4.3 3.8 3.5 3.3* 4.1  CL 97* 93* 97* 95* 94*  CO2 34* 34* 36* 38* 38*  GLUCOSE 130* 97 85 92 98  BUN 30* 27* 28* 25* 34*  CREATININE 0.62 0.62 0.64 0.55 0.63  CALCIUM 8.3* 8.3* 8.1* 7.9* 8.1*  MG  --   --  1.7 1.9 1.9   Liver Function Tests:  Recent Labs Lab 05/05/17 1516  AST 20  ALT 17  ALKPHOS 68   BILITOT 0.6  PROT 5.7*  ALBUMIN 2.6*   No results for input(s): LIPASE, AMYLASE in the last 168 hours. No results for input(s): AMMONIA in the last 168 hours. Coagulation Profile: No results for input(s): INR, PROTIME in the last 168 hours. CBC:  Recent Labs Lab 05/05/17 1516 05/06/17 1459  WBC 6.6 5.0  NEUTROABS 4.5 3.5  HGB 12.6 11.7*  HCT 40.1 37.7  MCV 87.4 88.5  PLT 240 214   Cardiac Enzymes:  Recent Labs Lab 05/05/17 1516  TROPONINI <0.03   BNP: Invalid input(s): POCBNP CBG:  Recent Labs Lab 05/09/17 1138 05/09/17 1612 05/09/17 2118 05/10/17 0718 05/10/17 1130  GLUCAP 207* 153* 146* 89 109*   HbA1C: No results for input(s): HGBA1C in the last 72 hours. Urine analysis:    Component Value Date/Time   COLORURINE STRAW (A) 05/05/2017 1651   APPEARANCEUR CLEAR 05/05/2017 1651   LABSPEC 1.006 05/05/2017 1651   PHURINE 6.0 05/05/2017 1651   GLUCOSEU NEGATIVE 05/05/2017 1651   HGBUR SMALL (A) 05/05/2017 1651   BILIRUBINUR NEGATIVE 05/05/2017 1651   KETONESUR NEGATIVE 05/05/2017 1651   PROTEINUR NEGATIVE 05/05/2017 1651   UROBILINOGEN 0.2 04/19/2014 1730   NITRITE NEGATIVE 05/05/2017 1651   LEUKOCYTESUR TRACE (A) 05/05/2017 1651   Sepsis Labs: @LABRCNTIP (procalcitonin:4,lacticidven:4) ) Recent Results (from the past 240 hour(s))  Urine culture     Status: Abnormal   Collection Time: 05/05/17  4:51 PM  Result Value Ref Range Status   Specimen Description URINE, CLEAN CATCH  Final   Special Requests NONE  Final   Culture MULTIPLE SPECIES PRESENT, SUGGEST RECOLLECTION (A)  Final   Report Status 05/07/2017 FINAL  Final  MRSA PCR Screening     Status: None   Collection Time: 05/05/17  7:12 PM  Result Value Ref Range Status   MRSA by PCR NEGATIVE NEGATIVE Final    Comment:        The GeneXpert MRSA Assay (FDA approved for NASAL specimens only), is one component of a comprehensive MRSA colonization surveillance program. It is not intended to  diagnose MRSA infection nor to guide or monitor treatment for MRSA infections.      Scheduled Meds: . apixaban  2.5 mg Oral BID  . atorvastatin  20 mg Oral q1800  . diltiazem  180 mg Oral Daily  . feeding supplement (PRO-STAT SUGAR FREE 64)  30 mL Oral TID WC  . furosemide  60 mg Intravenous Q8H  . gabapentin  200 mg Oral Q8H  . levothyroxine  25 mcg Oral QPM  . mirtazapine  7.5 mg Oral QHS  . polyethylene glycol  17 g Oral Daily  . potassium chloride  40 mEq Oral Daily  . sodium chloride flush  3 mL Intravenous Q12H   Continuous Infusions: . sodium chloride      Procedures/Studies: Dg Chest Port 1 View  Result Date: 05/05/2017 CLINICAL DATA:  Patient with swelling. EXAM: PORTABLE CHEST 1 VIEW COMPARISON:  Chest radiograph 03/07/2017. FINDINGS: Monitoring leads overlie the patient. Low lung volumes. Stable cardiomegaly with aortic atherosclerosis. Diffuse bilateral heterogeneous pulmonary opacities. Moderate layering bilateral pleural effusions. No pneumothorax. IMPRESSION: Findings most compatible with pulmonary edema and moderate layering bilateral pleural effusions. Cardiomegaly. Electronically Signed   By: Lovey Newcomer M.D.   On: 05/05/2017 15:03    Christen Bedoya, DO  [facility] Pager [phone]  If 7PM-7AM, please contact night-coverage [email or web address] Password [email or web address] 05/10/2017, 4:28 PM   LOS: 5 days

## 2017-05-11 ENCOUNTER — Inpatient Hospital Stay (HOSPITAL_COMMUNITY): Payer: Medicare Other

## 2017-05-11 LAB — GLUCOSE, CAPILLARY
GLUCOSE-CAPILLARY: 74 mg/dL (ref 65–99)
GLUCOSE-CAPILLARY: 77 mg/dL (ref 65–99)
Glucose-Capillary: 166 mg/dL — ABNORMAL HIGH (ref 65–99)
Glucose-Capillary: 101 mg/dL — ABNORMAL HIGH (ref 65–99)

## 2017-05-11 LAB — BASIC METABOLIC PANEL
Anion gap: 7 (ref 5–15)
BUN: 41 mg/dL — ABNORMAL HIGH (ref 6–20)
CHLORIDE: 94 mmol/L — AB (ref 101–111)
CO2: 38 mmol/L — AB (ref 22–32)
CREATININE: 0.6 mg/dL (ref 0.44–1.00)
Calcium: 8.1 mg/dL — ABNORMAL LOW (ref 8.9–10.3)
GFR calc non Af Amer: >60 mL/min (ref >60)
Glucose, Bld: 80 mg/dL (ref 65–99)
Potassium: 4 mmol/L (ref 3.5–5.1)
Sodium: 139 mmol/L (ref 135–145)

## 2017-05-11 LAB — MAGNESIUM: Magnesium: 1.9 mg/dL (ref 1.7–2.4)

## 2017-05-11 MED ORDER — FUROSEMIDE 10 MG/ML IJ SOLN
80.0000 mg | Freq: Three times a day (TID) | INTRAMUSCULAR | Status: DC
  Administered 2017-05-11 – 2017-05-18 (×22): 80 mg via INTRAVENOUS
  Filled 2017-05-11 (×24): qty 8

## 2017-05-11 NOTE — Progress Notes (Signed)
Pt has history of AFIB, which is controlled. MD contacted about expired Tele order. MD yet to respond. TELE remains on. Will continue to monitor.

## 2017-05-11 NOTE — Progress Notes (Signed)
PROGRESS NOTE  [name] WIO:[id number] DOB: 1921/03/04 DOA: 05/05/2017 PCP: Hilbert Corrigan, MD  Brief History: 83 y.o.femalehistory of afib, CAD with stent to LAD in 3845, chronic diastolic HF, HTN, hypothyroidism, hyperlipidemia, dementiaadmitted with lower extremity edema, weight gain.She reports gradual increase in swelling over the last several daysprior to admission. The patient was seen in the cardiology office on 05/05/2018. The patient was noted to have anasarca and worsening lower extremity edema. The patient was started on IV lasix. Cardiology was consulted to assist.  Assessment/Plan: Acute on chronic diastolic CHF -3.6I for the admission -Patient had dry weight of 123-125 in July 2019 -IV furosemide increased to 80 mg IV every 8 hours -05/06/2017 echo EF--60-65%, RV pressure overload with moderate reduction in RV function. PASP 79 -NEG 10  lbs for the admit -remains fluid overloaded -bisoprolol stopped due to sinus pauses-->improved -d/c lisinopril and imdur due to soft BPs -using smaller cuff  -appreciate cardiology follow up -am BMP  Permanent atrial fibrillation -Ratecontrolled -Continue diltiazem CD -CHADSVASc = 6 -continue apixaban  Sinus pauses/sinus bradycardia -resting HR in low 40s with up to 3 sec pauses -case discussed with Dr. Harl Bowie -d/c bisoprolol-->improved -continue dilt  Dementia -Continue Aricept, at baseline, no acute behavioral disturbances.  Hyperlipidemia -Continue Lipitor.  Hypertension -BPs soft now with diuresis --bisoprolol timing adjusted due to soft BPs-->stable -d/c lisinopril and imdur due to soft BPs  DiabetesMellitus type 2 -Hold metformin. -03/08/17 A1C - 7.2 -Has had several instances of hypoglycemia, only receiving a sensitive sliding scale, continue to monitor. -hypoglycemia likely due to poor po intake -d/c ISS but continue to check CBGs  Severe protein calorie  malnutrition -continue supplements  Hypokalemia -repleted -mag 1.9  Leg pain and edema -venous duplex legs--neg  R>LUE edema -duplex RUE   Disposition Plan: SNFin 2-3 days  Family Communication: Son updatedat bedside 9/30--Total time spent 35 minutes.  Greater than 50% spent face to face counseling and coordinating care.   Consultants: cardiology  Code Status: DNR  DVT Prophylaxis: apixaban   Procedures: As Listed in Progress Note Above  Antibiotics: None    Subjective: Patient denies fevers, chills, headache, chest pain, dyspnea, nausea, vomiting, diarrhea, abdominal pain, dysuria, hematuria, hematochezia, and melena.   Objective: Vitals:   05/10/17 1333 05/10/17 2128 05/11/17 0500 05/11/17 1300  BP: 109/73 116/70 109/82 (!) 113/41  Pulse: 81 75 78 86  Resp: 18 18  20   Temp: 97.8 F (36.6 C) 97.9 F (36.6 C) 98 F (36.7 C) (!) 97.5 F (36.4 C)  TempSrc: Oral Oral Axillary Oral  SpO2: 91% 100% 100% 94%  Weight:   66 kg (145 lb 8.1 oz)   Height:        Intake/Output Summary (Last 24 hours) at 05/11/17 1600 Last data filed at 05/11/17 1200  Gross per 24 hour  Intake              723 ml  Output              800 ml  Net              -77 ml   Weight change: -1.5 kg (-3 lb 4.9 oz) Exam:   General:  Pt is alert, follows commands appropriately, not in acute distress  HEENT: No icterus, No thrush, No neck mass, [place]/AT  Cardiovascular: IRRR, S1/S2, no rubs, no gallops  Respiratory: bibasilar crackles, no wheeze  Abdomen: Soft/+BS, non tender, non distended,  no guarding  Extremities: 2 + LE edema, No lymphangitis, No petechiae, No rashes, no synovitis; R>L upper ext edema   Data Reviewed: I have personally reviewed following labs and imaging studies Basic Metabolic Panel:  Recent Labs Lab 05/07/17 0551 05/08/17 0600 05/09/17 0626 05/10/17 0623 05/11/17 0605  NA 137 141 138 139 139  K 3.8 3.5 3.3* 4.1 4.0  CL 93* 97*  95* 94* 94*  CO2 34* 36* 38* 38* 38*  GLUCOSE 97 85 92 98 80  BUN 27* 28* 25* 34* 41*  CREATININE 0.62 0.64 0.55 0.63 0.60  CALCIUM 8.3* 8.1* 7.9* 8.1* 8.1*  MG  --  1.7 1.9 1.9 1.9   Liver Function Tests:  Recent Labs Lab 05/05/17 1516  AST 20  ALT 17  ALKPHOS 68  BILITOT 0.6  PROT 5.7*  ALBUMIN 2.6*   No results for input(s): LIPASE, AMYLASE in the last 168 hours. No results for input(s): AMMONIA in the last 168 hours. Coagulation Profile: No results for input(s): INR, PROTIME in the last 168 hours. CBC:  Recent Labs Lab 05/05/17 1516 05/06/17 1459  WBC 6.6 5.0  NEUTROABS 4.5 3.5  HGB 12.6 11.7*  HCT 40.1 37.7  MCV 87.4 88.5  PLT 240 214   Cardiac Enzymes:  Recent Labs Lab 05/05/17 1516  TROPONINI <0.03   BNP: Invalid input(s): POCBNP CBG:  Recent Labs Lab 05/10/17 1130 05/10/17 1630 05/10/17 2131 05/11/17 0718 05/11/17 1119  GLUCAP 109* 84 113* 74 77   HbA1C: No results for input(s): HGBA1C in the last 72 hours. Urine analysis:    Component Value Date/Time   COLORURINE STRAW (A) 05/05/2017 1651   APPEARANCEUR CLEAR 05/05/2017 1651   LABSPEC 1.006 05/05/2017 1651   PHURINE 6.0 05/05/2017 1651   GLUCOSEU NEGATIVE 05/05/2017 1651   HGBUR SMALL (A) 05/05/2017 1651   BILIRUBINUR NEGATIVE 05/05/2017 1651   KETONESUR NEGATIVE 05/05/2017 1651   PROTEINUR NEGATIVE 05/05/2017 1651   UROBILINOGEN 0.2 04/19/2014 1730   NITRITE NEGATIVE 05/05/2017 1651   LEUKOCYTESUR TRACE (A) 05/05/2017 1651   Sepsis Labs: @LABRCNTIP (procalcitonin:4,lacticidven:4) ) Recent Results (from the past 240 hour(s))  Urine culture     Status: Abnormal   Collection Time: 05/05/17  4:51 PM  Result Value Ref Range Status   Specimen Description URINE, CLEAN CATCH  Final   Special Requests NONE  Final   Culture MULTIPLE SPECIES PRESENT, SUGGEST RECOLLECTION (A)  Final   Report Status 05/07/2017 FINAL  Final  MRSA PCR Screening     Status: None   Collection Time:  05/05/17  7:12 PM  Result Value Ref Range Status   MRSA by PCR NEGATIVE NEGATIVE Final    Comment:        The GeneXpert MRSA Assay (FDA approved for NASAL specimens only), is one component of a comprehensive MRSA colonization surveillance program. It is not intended to diagnose MRSA infection nor to guide or monitor treatment for MRSA infections.      Scheduled Meds: . apixaban  2.5 mg Oral BID  . atorvastatin  20 mg Oral q1800  . diltiazem  180 mg Oral Daily  . feeding supplement (PRO-STAT SUGAR FREE 64)  30 mL Oral TID WC  . furosemide  80 mg Intravenous Q8H  . gabapentin  200 mg Oral Q8H  . levothyroxine  25 mcg Oral QPM  . mirtazapine  7.5 mg Oral QHS  . polyethylene glycol  17 g Oral Daily  . potassium chloride  40 mEq Oral Daily  . sodium  chloride flush  3 mL Intravenous Q12H   Continuous Infusions: . sodium chloride      Procedures/Studies: US Venous Img Lower Bilateral  Result Date: 05/11/2017 CLINICAL DATA:  Chronic bilateral lower extremity pain and edema. History of breast cancer. Evaluate for DVT. EXAM: BILATERAL LOWER EXTREMITY VENOUS DOPPLER ULTRASOUND TECHNIQUE: Gray-scale sonography with graded compression, as well as color Doppler and duplex ultrasound were performed to evaluate the lower extremity deep venous systems from the level of the common femoral vein and including the common femoral, femoral, profunda femoral, popliteal and calf veins including the posterior tibial, peroneal and gastrocnemius veins when visible. The superficial great saphenous vein was also interrogated. Spectral Doppler was utilized to evaluate flow at rest and with distal augmentation maneuvers in the common femoral, femoral and popliteal veins. COMPARISON:  Left lower extremity venous Doppler ultrasound - 03/04/2014 FINDINGS: RIGHT LOWER EXTREMITY Common Femoral Vein: No evidence of thrombus. Normal compressibility, respiratory phasicity and response to augmentation. Saphenofemoral  Junction: No evidence of thrombus. Normal compressibility and flow on color Doppler imaging. Profunda Femoral Vein: No evidence of thrombus. Normal compressibility and flow on color Doppler imaging. Femoral Vein: No evidence of thrombus. Normal compressibility, respiratory phasicity and response to augmentation. Popliteal Vein: No evidence of thrombus. Normal compressibility, respiratory phasicity and response to augmentation. Calf Veins: No evidence of thrombus. Normal compressibility and flow on color Doppler imaging. Superficial Great Saphenous Vein: No evidence of thrombus. Normal compressibility and flow on color Doppler imaging. Venous Reflux:  None. Other Findings: Subcutaneous edema is noted throughout the right lower extremity. LEFT LOWER EXTREMITY Common Femoral Vein: No evidence of thrombus. Normal compressibility, respiratory phasicity and response to augmentation. Saphenofemoral Junction: No evidence of thrombus. Normal compressibility and flow on color Doppler imaging. Profunda Femoral Vein: No evidence of thrombus. Normal compressibility and flow on color Doppler imaging. Femoral Vein: No evidence of thrombus. Normal compressibility, respiratory phasicity and response to augmentation. Popliteal Vein: No evidence of thrombus. Normal compressibility, respiratory phasicity and response to augmentation. Calf Veins: No evidence of thrombus. Normal compressibility and flow on color Doppler imaging. Superficial Great Saphenous Vein: No evidence of thrombus. Normal compressibility and flow on color Doppler imaging. Venous Reflux:  None. Other Findings: Subcutaneous edema is noted throughout the left lower extremity. IMPRESSION: No evidence of DVT within either lower extremity. Electronically Signed   By: Sandi Mariscal M.D.   On: 05/11/2017 14:11   Dg Chest Port 1 View  Result Date: 05/05/2017 CLINICAL DATA:  Patient with swelling. EXAM: PORTABLE CHEST 1 VIEW COMPARISON:  Chest radiograph 03/07/2017.  FINDINGS: Monitoring leads overlie the patient. Low lung volumes. Stable cardiomegaly with aortic atherosclerosis. Diffuse bilateral heterogeneous pulmonary opacities. Moderate layering bilateral pleural effusions. No pneumothorax. IMPRESSION: Findings most compatible with pulmonary edema and moderate layering bilateral pleural effusions. Cardiomegaly. Electronically Signed   By: Lovey Newcomer M.D.   On: 05/05/2017 15:03    Jalasia Eskridge, DO  [facility] Pager [phone]  If 7PM-7AM, please contact night-coverage [email or web address] Password [email or web address] 05/11/2017, 4:00 PM   LOS: 6 days

## 2017-05-12 ENCOUNTER — Inpatient Hospital Stay (HOSPITAL_COMMUNITY): Payer: Medicare Other

## 2017-05-12 DIAGNOSIS — I472 Ventricular tachycardia: Secondary | ICD-10-CM

## 2017-05-12 DIAGNOSIS — I959 Hypotension, unspecified: Secondary | ICD-10-CM

## 2017-05-12 LAB — GLUCOSE, CAPILLARY
GLUCOSE-CAPILLARY: 115 mg/dL — AB (ref 65–99)
GLUCOSE-CAPILLARY: 86 mg/dL (ref 65–99)
Glucose-Capillary: 65 mg/dL (ref 65–99)
Glucose-Capillary: 76 mg/dL (ref 65–99)
Glucose-Capillary: 121 mg/dL — ABNORMAL HIGH (ref 65–99)
Glucose-Capillary: 110 mg/dL — ABNORMAL HIGH (ref 65–99)
Glucose-Capillary: 187 mg/dL — ABNORMAL HIGH (ref 65–99)

## 2017-05-12 LAB — BASIC METABOLIC PANEL
ANION GAP: 5 (ref 5–15)
BUN: 40 mg/dL — ABNORMAL HIGH (ref 6–20)
CALCIUM: 7.6 mg/dL — AB (ref 8.9–10.3)
CO2: 37 mmol/L — AB (ref 22–32)
CREATININE: 0.53 mg/dL (ref 0.44–1.00)
Chloride: 97 mmol/L — ABNORMAL LOW (ref 101–111)
GFR calc Af Amer: >60 mL/min (ref >60)
GLUCOSE: 79 mg/dL (ref 65–99)
Potassium: 3.9 mmol/L (ref 3.5–5.1)
Sodium: 139 mmol/L (ref 135–145)

## 2017-05-12 MED ORDER — MAGNESIUM OXIDE 400 (241.3 MG) MG PO TABS
400.0000 mg | ORAL_TABLET | Freq: Once | ORAL | Status: AC
  Administered 2017-05-12: 400 mg via ORAL
  Filled 2017-05-12: qty 1

## 2017-05-12 NOTE — Progress Notes (Signed)
PROGRESS NOTE  [name] YPP:[id number] DOB: 04/09/1921 DOA: 05/05/2017 PCP: Hilbert Corrigan, MD  Brief History: 83 y.o.femalehistory of afib, CAD with stent to LAD in 4580, chronic diastolic HF, HTN, hypothyroidism, hyperlipidemia, dementiaadmitted with lower extremity edema, weight gain.She reports gradual increase in swelling over the last several daysprior to admission. The patient was seen in the cardiology office on 05/05/2018. The patient was noted to have anasarca and worsening lower extremity edema. The patient was started on IV lasix. Cardiology was consulted to assist.  Assessment/Plan: Acute on chronic diastolic CHF -9.9I for the admission -Patient had dry weight of 123-125 in July 2019 -IV furosemide increased to 80 mg IV every 8 hours -05/06/2017 echo EF--60-65%, RV pressure overload with moderate reduction in RV function. PASP 79 -NEG 10 lbs for the admit -remains fluid overloaded -bisoprolol stopped due to sinus pauses-->improved -d/c lisinopril and imdur due to soft BPs -using smaller cuff  -appreciate cardiology follow up -am BMP  Right Arm Erythema -has been transient and intermittent during hospitalization with blanchable and non-blanchable components -likely related to venous stasis from anasarca -will not start antibiotics as pt is afebrile and hemodynamically stable -see picture below--demarcation lines drawn -check CBC in am  Permanent atrial fibrillation -Ratecontrolled -Continue diltiazem CD -CHADSVASc = 6 -continue apixaban  Sinus pauses/sinus bradycardia -resting HR in low 40s with up to 3 sec pauses -d/c bisoprolol-->improved -continue dilt  Dementia -Continue Aricept, at baseline, no acute behavioral disturbances.  Hyperlipidemia -Continue Lipitor.  Hypertension -BPs soft now with diuresis -d/c bisoprolol due to pauses -d/c lisinopril and imdur due to soft BPs  DiabetesMellitus type 2 -Hold  metformin. -03/08/17 A1C - 7.2 -Has had several instances of hypoglycemia, only receiving a sensitive sliding scale, continue to monitor. -hypoglycemia likely due to poor po intake -d/c ISS but continue to check CBGs  Severe protein calorie malnutrition -continue supplements  Hypokalemia -repleted -mag 1.9  Leg pain and edema -venous duplex legs--neg  R>LUE edema -duplex RUE--NEG DVT   Disposition Plan: SNFin 2-3 days when cleared by cardiology Family Communication: Son updatedat bedside 9/30--Total time spent 35 minutes.  Greater than 50% spent face to face counseling and coordinating care.   Consultants: cardiology  Code Status: DNR  DVT Prophylaxis: apixaban   Procedures: As Listed in Progress Note Above  Antibiotics: None  Subjective: Patient denies fevers, chills, headache, chest pain, dyspnea, nausea, vomiting, diarrhea, abdominal pain, dysuria, hematuria,    Objective: Vitals:   05/12/17 0500 05/12/17 0800 05/12/17 0958 05/12/17 1421  BP: 112/85 93/73 (!) 115/54   Pulse: 70 95 86   Resp: 20 18 (!) 22   Temp: (!) 97.5 F (36.4 C) 97.6 F (36.4 C)  98.6 F (37 C)  TempSrc: Axillary Oral  Oral  SpO2:   98%   Weight: 66 kg (145 lb 8.1 oz)     Height:        Intake/Output Summary (Last 24 hours) at 05/12/17 1543 Last data filed at 05/12/17 1000  Gross per 24 hour  Intake              603 ml  Output             1600 ml  Net             -997 ml   Weight change: 0 kg (0 lb) Exam:   General:  Pt is alert, follows commands appropriately, not in acute distress  HEENT:  No icterus, No thrush, No neck mass, [place]/AT  Cardiovascular: IRRR, S1/S2, no rubs, no gallops  Respiratory: bibasilar crackles, no wheeze  Abdomen: Soft/+BS, non tender, non distended, no guarding  Extremities: 2 + LE edema, No lymphangitis, No petechiae, No rashes, no synovitis;  See _picture of right arm        Data Reviewed: I have personally  reviewed following labs and imaging studies Basic Metabolic Panel:  Recent Labs Lab 05/08/17 0600 05/09/17 0626 05/10/17 0623 05/11/17 0605 05/12/17 0700  NA 141 138 139 139 139  K 3.5 3.3* 4.1 4.0 3.9  CL 97* 95* 94* 94* 97*  CO2 36* 38* 38* 38* 37*  GLUCOSE 85 92 98 80 79  BUN 28* 25* 34* 41* 40*  CREATININE 0.64 0.55 0.63 0.60 0.53  CALCIUM 8.1* 7.9* 8.1* 8.1* 7.6*  MG 1.7 1.9 1.9 1.9  --    Liver Function Tests: No results for input(s): AST, ALT, ALKPHOS, BILITOT, PROT, ALBUMIN in the last 168 hours. No results for input(s): LIPASE, AMYLASE in the last 168 hours. No results for input(s): AMMONIA in the last 168 hours. Coagulation Profile: No results for input(s): INR, PROTIME in the last 168 hours. CBC:  Recent Labs Lab 05/06/17 1459  WBC 5.0  NEUTROABS 3.5  HGB 11.7*  HCT 37.7  MCV 88.5  PLT 214   Cardiac Enzymes: No results for input(s): CKTOTAL, CKMB, CKMBINDEX, TROPONINI in the last 168 hours. BNP: Invalid input(s): POCBNP CBG:  Recent Labs Lab 05/11/17 2128 05/12/17 0742 05/12/17 0810 05/12/17 1002 05/12/17 1141  GLUCAP 101* 65 76 121* 115*   HbA1C: No results for input(s): HGBA1C in the last 72 hours. Urine analysis:    Component Value Date/Time   COLORURINE STRAW (A) 05/05/2017 1651   APPEARANCEUR CLEAR 05/05/2017 1651   LABSPEC 1.006 05/05/2017 1651   PHURINE 6.0 05/05/2017 1651   GLUCOSEU NEGATIVE 05/05/2017 1651   HGBUR SMALL (A) 05/05/2017 1651   BILIRUBINUR NEGATIVE 05/05/2017 1651   KETONESUR NEGATIVE 05/05/2017 1651   PROTEINUR NEGATIVE 05/05/2017 1651   UROBILINOGEN 0.2 04/19/2014 1730   NITRITE NEGATIVE 05/05/2017 1651   LEUKOCYTESUR TRACE (A) 05/05/2017 1651   Sepsis Labs: @LABRCNTIP (procalcitonin:4,lacticidven:4) ) Recent Results (from the past 240 hour(s))  Urine culture     Status: Abnormal   Collection Time: 05/05/17  4:51 PM  Result Value Ref Range Status   Specimen Description URINE, CLEAN CATCH  Final   Special  Requests NONE  Final   Culture MULTIPLE SPECIES PRESENT, SUGGEST RECOLLECTION (A)  Final   Report Status 05/07/2017 FINAL  Final  MRSA PCR Screening     Status: None   Collection Time: 05/05/17  7:12 PM  Result Value Ref Range Status   MRSA by PCR NEGATIVE NEGATIVE Final    Comment:        The GeneXpert MRSA Assay (FDA approved for NASAL specimens only), is one component of a comprehensive MRSA colonization surveillance program. It is not intended to diagnose MRSA infection nor to guide or monitor treatment for MRSA infections.      Scheduled Meds: . apixaban  2.5 mg Oral BID  . atorvastatin  20 mg Oral q1800  . diltiazem  180 mg Oral Daily  . feeding supplement (PRO-STAT SUGAR FREE 64)  30 mL Oral TID WC  . furosemide  80 mg Intravenous Q8H  . gabapentin  200 mg Oral Q8H  . levothyroxine  25 mcg Oral QPM  . mirtazapine  7.5 mg Oral QHS  .  polyethylene glycol  17 g Oral Daily  . potassium chloride  40 mEq Oral Daily  . sodium chloride flush  3 mL Intravenous Q12H   Continuous Infusions: . sodium chloride      Procedures/Studies: US Venous Img Lower Bilateral  Result Date: 05/11/2017 CLINICAL DATA:  Chronic bilateral lower extremity pain and edema. History of breast cancer. Evaluate for DVT. EXAM: BILATERAL LOWER EXTREMITY VENOUS DOPPLER ULTRASOUND TECHNIQUE: Gray-scale sonography with graded compression, as well as color Doppler and duplex ultrasound were performed to evaluate the lower extremity deep venous systems from the level of the common femoral vein and including the common femoral, femoral, profunda femoral, popliteal and calf veins including the posterior tibial, peroneal and gastrocnemius veins when visible. The superficial great saphenous vein was also interrogated. Spectral Doppler was utilized to evaluate flow at rest and with distal augmentation maneuvers in the common femoral, femoral and popliteal veins. COMPARISON:  Left lower extremity venous Doppler  ultrasound - 03/04/2014 FINDINGS: RIGHT LOWER EXTREMITY Common Femoral Vein: No evidence of thrombus. Normal compressibility, respiratory phasicity and response to augmentation. Saphenofemoral Junction: No evidence of thrombus. Normal compressibility and flow on color Doppler imaging. Profunda Femoral Vein: No evidence of thrombus. Normal compressibility and flow on color Doppler imaging. Femoral Vein: No evidence of thrombus. Normal compressibility, respiratory phasicity and response to augmentation. Popliteal Vein: No evidence of thrombus. Normal compressibility, respiratory phasicity and response to augmentation. Calf Veins: No evidence of thrombus. Normal compressibility and flow on color Doppler imaging. Superficial Great Saphenous Vein: No evidence of thrombus. Normal compressibility and flow on color Doppler imaging. Venous Reflux:  None. Other Findings: Subcutaneous edema is noted throughout the right lower extremity. LEFT LOWER EXTREMITY Common Femoral Vein: No evidence of thrombus. Normal compressibility, respiratory phasicity and response to augmentation. Saphenofemoral Junction: No evidence of thrombus. Normal compressibility and flow on color Doppler imaging. Profunda Femoral Vein: No evidence of thrombus. Normal compressibility and flow on color Doppler imaging. Femoral Vein: No evidence of thrombus. Normal compressibility, respiratory phasicity and response to augmentation. Popliteal Vein: No evidence of thrombus. Normal compressibility, respiratory phasicity and response to augmentation. Calf Veins: No evidence of thrombus. Normal compressibility and flow on color Doppler imaging. Superficial Great Saphenous Vein: No evidence of thrombus. Normal compressibility and flow on color Doppler imaging. Venous Reflux:  None. Other Findings: Subcutaneous edema is noted throughout the left lower extremity. IMPRESSION: No evidence of DVT within either lower extremity. Electronically Signed   By: Sandi Mariscal  M.D.   On: 05/11/2017 14:11   Dg Chest Port 1 View  Result Date: 05/05/2017 CLINICAL DATA:  Patient with swelling. EXAM: PORTABLE CHEST 1 VIEW COMPARISON:  Chest radiograph 03/07/2017. FINDINGS: Monitoring leads overlie the patient. Low lung volumes. Stable cardiomegaly with aortic atherosclerosis. Diffuse bilateral heterogeneous pulmonary opacities. Moderate layering bilateral pleural effusions. No pneumothorax. IMPRESSION: Findings most compatible with pulmonary edema and moderate layering bilateral pleural effusions. Cardiomegaly. Electronically Signed   By: Lovey Newcomer M.D.   On: 05/05/2017 15:03    Mirtie Bastyr, DO  [facility] Pager [phone]  If 7PM-7AM, please contact night-coverage [email or web address] Password [email or web address] 05/12/2017, 3:43 PM   LOS: 7 days

## 2017-05-12 NOTE — Progress Notes (Signed)
Progress Note  Patient Name: [name] Date of Encounter: 05/12/2017  Primary Cardiologist: Dr. Harrington Challenger  Subjective   Says she is breathing better. Feet hurt.  Inpatient Medications    Scheduled Meds: . apixaban  2.5 mg Oral BID  . atorvastatin  20 mg Oral q1800  . diltiazem  180 mg Oral Daily  . feeding supplement (PRO-STAT SUGAR FREE 64)  30 mL Oral TID WC  . furosemide  80 mg Intravenous Q8H  . gabapentin  200 mg Oral Q8H  . levothyroxine  25 mcg Oral QPM  . mirtazapine  7.5 mg Oral QHS  . polyethylene glycol  17 g Oral Daily  . potassium chloride  40 mEq Oral Daily  . sodium chloride flush  3 mL Intravenous Q12H   Continuous Infusions: . sodium chloride     PRN Meds: sodium chloride, acetaminophen, ondansetron (ZOFRAN) IV, sodium chloride flush   Vital Signs    Vitals:   05/11/17 1300 05/11/17 2125 05/12/17 0500 05/12/17 0800  BP: (!) 113/41 102/66 112/85 93/73  Pulse: 86 (!) 103 70 95  Resp: 20 18 20 18   Temp: (!) 97.5 F (36.4 C) 98.2 F (36.8 C) (!) 97.5 F (36.4 C) 97.6 F (36.4 C)  TempSrc: Oral Oral Axillary Oral  SpO2: 94% 98%    Weight:   145 lb 8.1 oz (66 kg)   Height:        Intake/Output Summary (Last 24 hours) at 05/12/17 0907 Last data filed at 05/12/17 5885  Gross per 24 hour  Intake              843 ml  Output              700 ml  Net              143 ml   Filed Weights   05/10/17 0458 05/11/17 0500 05/12/17 0500  Weight: 148 lb 13 oz (67.5 kg) 145 lb 8.1 oz (66 kg) 145 lb 8.1 oz (66 kg)    Telemetry    Atrial fib with variable rates, 9-beat run of NSVT - Personally Reviewed  ECG    n/a  Physical Exam   GEN: No acute distress.   Neck: No JVD Cardiac: Irregular, 3/6 apical holosystolic murmur and 3/6 ejection systolic murmur over RUSB Respiratory: Clear to auscultation bilaterally. GI: Soft, nontender, non-distended  MS: No edema; No deformity. Stasis dermatitis in both legs. Right heel bandaged. Neuro:  Nonfocal    Psych: Normal affect   Labs    Chemistry Recent Labs Lab 05/05/17 1516  05/10/17 0623 05/11/17 0605 05/12/17 0700  NA 135  < > 139 139 139  K 4.5  < > 4.1 4.0 3.9  CL 96*  < > 94* 94* 97*  CO2 31  < > 38* 38* 37*  GLUCOSE 139*  < > 98 80 79  BUN 30*  < > 34* 41* 40*  CREATININE 0.77  < > 0.63 0.60 0.53  CALCIUM 8.3*  < > 8.1* 8.1* 7.6*  PROT 5.7*  --   --   --   --   ALBUMIN 2.6*  --   --   --   --   AST 20  --   --   --   --   ALT 17  --   --   --   --   ALKPHOS 68  --   --   --   --   BILITOT  0.6  --   --   --   --   GFRNONAA >60  < > >60 >60 >60  GFRAA >60  < > >60 >60 >60  ANIONGAP 8  < > 7 7 5   < > = values in this interval not displayed.   Hematology Recent Labs Lab 05/05/17 1516 05/06/17 1459  WBC 6.6 5.0  RBC 4.59 4.26  HGB 12.6 11.7*  HCT 40.1 37.7  MCV 87.4 88.5  MCH 27.5 27.5  MCHC 31.4 31.0  RDW 20.7* 20.8*  PLT 240 214    Cardiac Enzymes Recent Labs Lab 05/05/17 1516  TROPONINI <0.03   No results for input(s): TROPIPOC in the last 168 hours.   BNP Recent Labs Lab 05/05/17 1516  BNP 1,982.0*     DDimer No results for input(s): DDIMER in the last 168 hours.   Radiology    US Venous Img Lower Bilateral  Result Date: 05/11/2017 CLINICAL DATA:  Chronic bilateral lower extremity pain and edema. History of breast cancer. Evaluate for DVT. EXAM: BILATERAL LOWER EXTREMITY VENOUS DOPPLER ULTRASOUND TECHNIQUE: Gray-scale sonography with graded compression, as well as color Doppler and duplex ultrasound were performed to evaluate the lower extremity deep venous systems from the level of the common femoral vein and including the common femoral, femoral, profunda femoral, popliteal and calf veins including the posterior tibial, peroneal and gastrocnemius veins when visible. The superficial great saphenous vein was also interrogated. Spectral Doppler was utilized to evaluate flow at rest and with distal augmentation maneuvers in the common femoral,  femoral and popliteal veins. COMPARISON:  Left lower extremity venous Doppler ultrasound - 03/04/2014 FINDINGS: RIGHT LOWER EXTREMITY Common Femoral Vein: No evidence of thrombus. Normal compressibility, respiratory phasicity and response to augmentation. Saphenofemoral Junction: No evidence of thrombus. Normal compressibility and flow on color Doppler imaging. Profunda Femoral Vein: No evidence of thrombus. Normal compressibility and flow on color Doppler imaging. Femoral Vein: No evidence of thrombus. Normal compressibility, respiratory phasicity and response to augmentation. Popliteal Vein: No evidence of thrombus. Normal compressibility, respiratory phasicity and response to augmentation. Calf Veins: No evidence of thrombus. Normal compressibility and flow on color Doppler imaging. Superficial Great Saphenous Vein: No evidence of thrombus. Normal compressibility and flow on color Doppler imaging. Venous Reflux:  None. Other Findings: Subcutaneous edema is noted throughout the right lower extremity. LEFT LOWER EXTREMITY Common Femoral Vein: No evidence of thrombus. Normal compressibility, respiratory phasicity and response to augmentation. Saphenofemoral Junction: No evidence of thrombus. Normal compressibility and flow on color Doppler imaging. Profunda Femoral Vein: No evidence of thrombus. Normal compressibility and flow on color Doppler imaging. Femoral Vein: No evidence of thrombus. Normal compressibility, respiratory phasicity and response to augmentation. Popliteal Vein: No evidence of thrombus. Normal compressibility, respiratory phasicity and response to augmentation. Calf Veins: No evidence of thrombus. Normal compressibility and flow on color Doppler imaging. Superficial Great Saphenous Vein: No evidence of thrombus. Normal compressibility and flow on color Doppler imaging. Venous Reflux:  None. Other Findings: Subcutaneous edema is noted throughout the left lower extremity. IMPRESSION: No evidence of  DVT within either lower extremity. Electronically Signed   By: Sandi Mariscal M.D.   On: 05/11/2017 14:11    Cardiac Studies   Echocardiogram (05/06/17):  - Left ventricle: The cavity size was normal. Wall thickness was   increased in a pattern of moderate LVH. Systolic function was   normal. The estimated ejection fraction was in the range of 60%   to 65%. Wall motion was normal;  there were no regional wall   motion abnormalities. - Aortic valve: Severely calcified annulus. Trileaflet; severely   thickened leaflets. There was moderate stenosis. Lower gradient   than expected for moderate AS likely due to low stroke volume   index (paradoxical low flow low gradient AS). Morphologically and   by AVA VTI there is at least moderate AS. Mean gradient (S): 7 mm   Hg. VTI ratio of LVOT to aortic valve: 0.37. Valve area (VTI):   1.17 cm^2. Valve area (Vmax): 0.91 cm^2. - Mitral valve: Moderately calcified annulus. Mildly thickened   leaflets . There was moderate regurgitation. The MR is eccentric   and may be underestimated. The MR VC is 0.4 cm. - Left atrium: The atrium was massively dilated. - Right ventricle: The ventricular septum is flattened in systole   consistent with RV pressure overload. The cavity size was   moderately dilated. Systolic function was moderately reduced.   TAPSE: 10 mm . - Right atrium: The atrium was massively dilated. - Atrial septum: No defect or patent foramen ovale was identified. - Tricuspid valve: There was mild-moderate regurgitation. - Pulmonary arteries: Systolic pressure was severely increased. PA   peak pressure: 79 mm Hg (S). - Inferior vena cava: The vessel was dilated. The respirophasic   diameter changes were blunted (< 50%), consistent with elevated   central venous pressure. - Pericardium, extracardiac: There is a large left pleural   effusion.  Patient Profile     83 y.o. female with known history of atrial fib, CAD, HTN, seen in cardiology  office on 05/05/17 and found to have significant volume overload, sent to hospital for IV diuresis.   Assessment & Plan    1. Acute on chronic diastolic HF: Symptomatically improved. 950 cc output yet to be recorded. Currently on IV Lasix 80 mg TID with uptrend in BUN to 40 (25 on admission). Likely has a component of cardiorenal syndrome. Continue current dosing for now.  2. Rapid atrial fibrillation/tachycardia-bradycardia syndrome: HR's are variable. Has tachy-brady syndrome. Due to HR in 40's on 9/28 with pauses, bisoprolol was d/c. She is a poor pacemaker candidate. Continue long-acting diltiazem 180 mg daily. Continue apixaban for anticoagulation  3.Hypotension: Soft bp's at times, has had some difficultly with bp measurements due to severe arm edema. She has been asymptomatic. Lisinopril and Imdur stopped last week. Continue to monitor.  4. Hypokalemia: K normal, 3.9 today.  5. NSVT: Will provide 400 mg magnesium oxide. Aim to keep Mg > 2 in order to increase arrhythmic threshold.   For questions or updates, please contact [place] Please consult [email or web address] for contact info under Cardiology/STEMI.      Signed, Kate Sable, MD  05/12/2017, 9:07 AM

## 2017-05-13 ENCOUNTER — Inpatient Hospital Stay (HOSPITAL_COMMUNITY): Payer: Medicare Other

## 2017-05-13 ENCOUNTER — Encounter (HOSPITAL_COMMUNITY): Payer: Self-pay | Admitting: Primary Care

## 2017-05-13 DIAGNOSIS — I9589 Other hypotension: Secondary | ICD-10-CM

## 2017-05-13 DIAGNOSIS — F0280 Dementia in other diseases classified elsewhere without behavioral disturbance: Secondary | ICD-10-CM

## 2017-05-13 DIAGNOSIS — Z515 Encounter for palliative care: Secondary | ICD-10-CM

## 2017-05-13 DIAGNOSIS — G3183 Dementia with Lewy bodies: Secondary | ICD-10-CM

## 2017-05-13 DIAGNOSIS — T82838S Hemorrhage of vascular prosthetic devices, implants and grafts, sequela: Secondary | ICD-10-CM

## 2017-05-13 DIAGNOSIS — I5023 Acute on chronic systolic (congestive) heart failure: Secondary | ICD-10-CM

## 2017-05-13 LAB — GLUCOSE, CAPILLARY
Glucose-Capillary: 107 mg/dL — ABNORMAL HIGH (ref 65–99)
Glucose-Capillary: 113 mg/dL — ABNORMAL HIGH (ref 65–99)
Glucose-Capillary: 131 mg/dL — ABNORMAL HIGH (ref 65–99)
Glucose-Capillary: 244 mg/dL — ABNORMAL HIGH (ref 65–99)

## 2017-05-13 LAB — COMPREHENSIVE METABOLIC PANEL
ALK PHOS: 66 U/L (ref 38–126)
ALT: 20 U/L (ref 14–54)
ANION GAP: 4 — AB (ref 5–15)
AST: 25 U/L (ref 15–41)
Albumin: 2.1 g/dL — ABNORMAL LOW (ref 3.5–5.0)
BUN: 42 mg/dL — ABNORMAL HIGH (ref 6–20)
CALCIUM: 7.9 mg/dL — AB (ref 8.9–10.3)
CHLORIDE: 95 mmol/L — AB (ref 101–111)
CO2: 40 mmol/L — ABNORMAL HIGH (ref 22–32)
CREATININE: 0.63 mg/dL (ref 0.44–1.00)
Glucose, Bld: 99 mg/dL (ref 65–99)
Potassium: 3.9 mmol/L (ref 3.5–5.1)
Sodium: 139 mmol/L (ref 135–145)
Total Bilirubin: 0.7 mg/dL (ref 0.3–1.2)
Total Protein: 4.9 g/dL — ABNORMAL LOW (ref 6.5–8.1)

## 2017-05-13 LAB — CBC
HCT: 35 % — ABNORMAL LOW (ref 36.0–46.0)
HEMOGLOBIN: 10.8 g/dL — AB (ref 12.0–15.0)
MCH: 27.4 pg (ref 26.0–34.0)
MCHC: 30.9 g/dL (ref 30.0–36.0)
MCV: 88.8 fL (ref 78.0–100.0)
PLATELETS: 207 10*3/uL (ref 150–400)
RBC: 3.94 MIL/uL (ref 3.87–5.11)
RDW: 20.2 % — AB (ref 11.5–15.5)
WBC: 4.4 10*3/uL (ref 4.0–10.5)

## 2017-05-13 MED ORDER — SODIUM CHLORIDE 0.9 % IV SOLN: Freq: Once | INTRAVENOUS | Status: DC

## 2017-05-13 NOTE — Care Management Note (Signed)
Case Management Note  Patient Details  Name: [name] MRN: [id number] Date of Birth: 09-Apr-1921  If discussed at Long Length of Stay Meetings, dates discussed:  05/13/2017  Sherald Barge, RN 05/13/2017, 12:07 PM

## 2017-05-13 NOTE — Progress Notes (Signed)
Progress Note  Patient Name: [name] Date of Encounter: 05/13/2017  Primary Cardiologist: Dr. Harrington Challenger  Subjective   Says she's tired.  Inpatient Medications    Scheduled Meds: . apixaban  2.5 mg Oral BID  . atorvastatin  20 mg Oral q1800  . diltiazem  180 mg Oral Daily  . feeding supplement (PRO-STAT SUGAR FREE 64)  30 mL Oral TID WC  . furosemide  80 mg Intravenous Q8H  . gabapentin  200 mg Oral Q8H  . levothyroxine  25 mcg Oral QPM  . mirtazapine  7.5 mg Oral QHS  . polyethylene glycol  17 g Oral Daily  . potassium chloride  40 mEq Oral Daily  . sodium chloride flush  3 mL Intravenous Q12H   Continuous Infusions: . sodium chloride     PRN Meds: sodium chloride, acetaminophen, ondansetron (ZOFRAN) IV, sodium chloride flush   Vital Signs    Vitals:   05/12/17 1421 05/12/17 2034 05/13/17 0628 05/13/17 0700  BP:  (!) 117/52 107/80   Pulse:  85 100   Resp:  (!) 21 16   Temp: 98.6 F (37 C) 97.8 F (36.6 C) (!) 97.5 F (36.4 C)   TempSrc: Oral Axillary Axillary   SpO2:  95% 99%   Weight:    143 lb 15.4 oz (65.3 kg)  Height:        Intake/Output Summary (Last 24 hours) at 05/13/17 0823 Last data filed at 05/13/17 0600  Gross per 24 hour  Intake              480 ml  Output             2500 ml  Net            -2020 ml   Filed Weights   05/11/17 0500 05/12/17 0500 05/13/17 0700  Weight: 145 lb 8.1 oz (66 kg) 145 lb 8.1 oz (66 kg) 143 lb 15.4 oz (65.3 kg)    Telemetry    A fib, variable rates, PVC's, currently in 80 bpm range - Personally Reviewed  ECG    n/a  Physical Exam   GEN: No acute distress.   Neck: No JVD Cardiac: Irregular, 3/6 apical holosystolic murmur and 3/6 ejection systolic murmur over RUSB  Respiratory: Clear to auscultation bilaterally. GI: Soft, nontender, non-distended  MS: 2+ pitting bilateral lower extremity edema, right arm swollen and erythematous Neuro:  Nonfocal  Psych: Normal affect   Labs    Chemistry Recent  Labs Lab 05/11/17 0605 05/12/17 0700 05/13/17 0615  NA 139 139 139  K 4.0 3.9 3.9  CL 94* 97* 95*  CO2 38* 37* 40*  GLUCOSE 80 79 99  BUN 41* 40* 42*  CREATININE 0.60 0.53 0.63  CALCIUM 8.1* 7.6* 7.9*  PROT  --   --  4.9*  ALBUMIN  --   --  2.1*  AST  --   --  25  ALT  --   --  20  ALKPHOS  --   --  66  BILITOT  --   --  0.7  GFRNONAA >60 >60 >60  GFRAA >60 >60 >60  ANIONGAP 7 5 4*     Hematology Recent Labs Lab 05/06/17 1459 05/13/17 0615  WBC 5.0 4.4  RBC 4.26 3.94  HGB 11.7* 10.8*  HCT 37.7 35.0*  MCV 88.5 88.8  MCH 27.5 27.4  MCHC 31.0 30.9  RDW 20.8* 20.2*  PLT 214 207    Cardiac EnzymesNo results  for input(s): TROPONINI in the last 168 hours. No results for input(s): TROPIPOC in the last 168 hours.   BNPNo results for input(s): BNP, PROBNP in the last 168 hours.   DDimer No results for input(s): DDIMER in the last 168 hours.   Radiology    US Venous Img Lower Bilateral  Result Date: 05/11/2017 CLINICAL DATA:  Chronic bilateral lower extremity pain and edema. History of breast cancer. Evaluate for DVT. EXAM: BILATERAL LOWER EXTREMITY VENOUS DOPPLER ULTRASOUND TECHNIQUE: Gray-scale sonography with graded compression, as well as color Doppler and duplex ultrasound were performed to evaluate the lower extremity deep venous systems from the level of the common femoral vein and including the common femoral, femoral, profunda femoral, popliteal and calf veins including the posterior tibial, peroneal and gastrocnemius veins when visible. The superficial great saphenous vein was also interrogated. Spectral Doppler was utilized to evaluate flow at rest and with distal augmentation maneuvers in the common femoral, femoral and popliteal veins. COMPARISON:  Left lower extremity venous Doppler ultrasound - 03/04/2014 FINDINGS: RIGHT LOWER EXTREMITY Common Femoral Vein: No evidence of thrombus. Normal compressibility, respiratory phasicity and response to augmentation.  Saphenofemoral Junction: No evidence of thrombus. Normal compressibility and flow on color Doppler imaging. Profunda Femoral Vein: No evidence of thrombus. Normal compressibility and flow on color Doppler imaging. Femoral Vein: No evidence of thrombus. Normal compressibility, respiratory phasicity and response to augmentation. Popliteal Vein: No evidence of thrombus. Normal compressibility, respiratory phasicity and response to augmentation. Calf Veins: No evidence of thrombus. Normal compressibility and flow on color Doppler imaging. Superficial Great Saphenous Vein: No evidence of thrombus. Normal compressibility and flow on color Doppler imaging. Venous Reflux:  None. Other Findings: Subcutaneous edema is noted throughout the right lower extremity. LEFT LOWER EXTREMITY Common Femoral Vein: No evidence of thrombus. Normal compressibility, respiratory phasicity and response to augmentation. Saphenofemoral Junction: No evidence of thrombus. Normal compressibility and flow on color Doppler imaging. Profunda Femoral Vein: No evidence of thrombus. Normal compressibility and flow on color Doppler imaging. Femoral Vein: No evidence of thrombus. Normal compressibility, respiratory phasicity and response to augmentation. Popliteal Vein: No evidence of thrombus. Normal compressibility, respiratory phasicity and response to augmentation. Calf Veins: No evidence of thrombus. Normal compressibility and flow on color Doppler imaging. Superficial Great Saphenous Vein: No evidence of thrombus. Normal compressibility and flow on color Doppler imaging. Venous Reflux:  None. Other Findings: Subcutaneous edema is noted throughout the left lower extremity. IMPRESSION: No evidence of DVT within either lower extremity. Electronically Signed   By: Sandi Mariscal M.D.   On: 05/11/2017 14:11   US Venous Img Upper Uni Right  Result Date: 05/12/2017 CLINICAL DATA:  Right arm swelling for several days EXAM: RIGHT UPPER EXTREMITY VENOUS  DOPPLER ULTRASOUND TECHNIQUE: Gray-scale sonography with graded compression, as well as color Doppler and duplex ultrasound were performed to evaluate the upper extremity deep venous system from the level of the subclavian vein and including the jugular, axillary, basilic, radial, ulnar and upper cephalic vein. Spectral Doppler was utilized to evaluate flow at rest and with distal augmentation maneuvers. COMPARISON:  None. FINDINGS: Contralateral Subclavian Vein: Respiratory phasicity is normal and symmetric with the symptomatic side. No evidence of thrombus. Normal compressibility. Internal Jugular Vein: No evidence of thrombus. Normal compressibility, respiratory phasicity and response to augmentation. Subclavian Vein: No evidence of thrombus. Normal compressibility, respiratory phasicity and response to augmentation. Axillary Vein: No evidence of thrombus. Normal compressibility, respiratory phasicity and response to augmentation. Cephalic Vein: No evidence of thrombus.  Normal compressibility, respiratory phasicity and response to augmentation. Basilic Vein: No evidence of thrombus. Normal compressibility, respiratory phasicity and response to augmentation. Brachial Veins: No evidence of thrombus. Normal compressibility, respiratory phasicity and response to augmentation. Radial Veins: No evidence of thrombus. Normal compressibility, respiratory phasicity and response to augmentation. Ulnar Veins: No evidence of thrombus. Normal compressibility, respiratory phasicity and response to augmentation. Venous Reflux:  None visualized. Other Findings: PICC line is noted without evidence of deep venous thrombosis. IMPRESSION: No evidence of DVT within the right upper extremity. Electronically Signed   By: Inez Catalina M.D.   On: 05/12/2017 15:47   Dg Chest Port 1 View  Result Date: 05/13/2017 CLINICAL DATA:  Central catheter placement with bleeding around catheter site EXAM: PORTABLE CHEST 1 VIEW COMPARISON:   May 05, 2017 FINDINGS: Port-A-Cath tip is in the right atrium, approximately 6 cm distal to the cavoatrial junction. No pneumothorax. There is cardiomegaly with pulmonary venous hypertension. There are bilateral pleural effusions with generalized interstitial and patchy alveolar edema. There is aortic atherosclerosis. No adenopathy. There is degenerative change in each shoulder with superior migration of the right humeral head. IMPRESSION: Central catheter tip in right atrium. No pneumothorax. Underlying congestive heart failure. Edema and effusions appear stable compared to 1 day prior. There is aortic atherosclerosis. Chronic rotator cuff tear noted on the right with superior migration of the right humeral head. Aortic Atherosclerosis (ICD10-I70.0). Electronically Signed   By: Lowella Grip III M.D.   On: 05/13/2017 07:50    Cardiac Studies   Echocardiogram (05/06/17):  - Left ventricle: The cavity size was normal. Wall thickness was increased in a pattern of moderate LVH. Systolic function was normal. The estimated ejection fraction was in the range of 60% to 65%. Wall motion was normal; there were no regional wall motion abnormalities. - Aortic valve: Severely calcified annulus. Trileaflet; severely thickened leaflets. There was moderate stenosis. Lower gradient than expected for moderate AS likely due to low stroke volume index (paradoxical low flow low gradient AS). Morphologically and by AVA VTI there is at least moderate AS. Mean gradient (S): 7 mm Hg. VTI ratio of LVOT to aortic valve: 0.37. Valve area (VTI): 1.17 cm^2. Valve area (Vmax): 0.91 cm^2. - Mitral valve: Moderately calcified annulus. Mildly thickened leaflets . There was moderate regurgitation. The MR is eccentric and may be underestimated. The MR VC is 0.4 cm. - Left atrium: The atrium was massively dilated. - Right ventricle: The ventricular septum is flattened in systole consistent  with RV pressure overload. The cavity size was moderately dilated. Systolic function was moderately reduced. TAPSE: 10 mm . - Right atrium: The atrium was massively dilated. - Atrial septum: No defect or patent foramen ovale was identified. - Tricuspid valve: There was mild-moderate regurgitation. - Pulmonary arteries: Systolic pressure was severely increased. PA peak pressure: 79 mm Hg (S). - Inferior vena cava: The vessel was dilated. The respirophasic diameter changes were blunted (<50%), consistent with elevated central venous pressure. - Pericardium, extracardiac: There is a large left pleural effusion.  Patient Profile     83 y.o. female with known history of atrial fib, CAD, HTN, seen in cardiology office on 05/05/17 and found to have significant volume overload, sent to hospital for IV diuresis.   Assessment & Plan    1. Acute on chronic diastolic LV heart failure with right heart failure/severe pulmonary hypertension: Symptomatically improved. Over 2 L output in last 24 hrs. Currently on IV Lasix 80 mg TID with uptrend in BUN  to 42 (25 on admission). Dry weight reportedly in 125 lbs range, currently 143 lbs, down 2 lbs from yesterday.  Likely has a component of cardiorenal syndrome. Continue current dosing for now.  2. Rapid atrial fibrillation/tachycardia-bradycardia syndrome: HR's are variable but fairly well controlled overall. Has tachy-brady syndrome. Due to HR in 40's on 9/28 with pauses, bisoprolol was d/c. She is a poor pacemaker candidate. Continue long-acting diltiazem 180 mg daily. Continue apixaban for anticoagulation  3.Hypotension: Soft bp's at times, has had some difficultly with bp measurements due to severe right arm edema. She has been asymptomatic. Lisinopril and Imdur stopped last week. Continue to monitor.  4. Hypokalemia: K normal, 3.9 today.  5. NSVT: Resolved with provision of 400 mg magnesium oxide on 10/1. Aim to keep Mg > 2 in order to  increase arrhythmic threshold.   For questions or updates, please contact [place] Please consult [email or web address] for contact info under Cardiology/STEMI.      Signed, Kate Sable, MD  05/13/2017, 8:23 AM

## 2017-05-13 NOTE — Progress Notes (Signed)
PROGRESS NOTE  [name] PXT:[id number] DOB: [birth year] DOA: 05/05/2017 PCP: Hilbert Corrigan, MD  Brief History: 83 y.o.femalehistory of afib, CAD with stent to LAD in 6270, chronic diastolic HF, HTN, hypothyroidism, hyperlipidemia, dementiaadmitted with lower extremity edema, weight gain. The family  reports gradual increase in swelling over the last several daysprior to admission. The patient was seen in the cardiology office on 05/05/2018. The patient was noted to have anasarca and worsening lower extremity edema. The patient was started on IV lasix. Cardiology was consulted to assist.  Assessment/Plan: Acute on chronic diastolic CHF -3.5K for the admission -Patient had dry weight of 123-125 in July 2019 -IV furosemide increased to 80 mg IV every 8 hours 05/11/17 -05/06/2017 echo EF--60-65%, RV pressure overload with moderate reduction in RV function. PASP 79 -NEG 12 lbs for the admit -remains fluid overloaded -bisoprolol stopped due to sinus pauses-->improved -d/c lisinopril and imdur due to soft BPs -using smaller cuff  -appreciate cardiology follow up -am BMP  Right Arm Erythema -has been transient and intermittent during hospitalization with blanchable and non-blanchable components -likely related to venous stasis from anasarca -will not start antibiotics as pt is afebrile and hemodynamically stable without leukocytosis -see picture below--improved today  Permanent atrial fibrillation -Ratecontrolled -Continue diltiazem CD -CHADSVASc = 6 -continue apixaban  Sinus pauses/sinus bradycardia -resting HR in low 40s with up to 3 sec pauses -d/c bisoprolol-->improved -continue dilt  Dementia -at baseline, no acute behavioral disturbances.  Hyperlipidemia -Continue Lipitor.  Hypertension -BPs soft now with diuresis -d/c bisoprolol due to pauses -d/c lisinopril and imdur due to soft BPs  DiabetesMellitus type 2 -Hold  metformin. -03/08/17 A1C - 7.2 -Has had several instances of hypoglycemia, only receiving a sensitive sliding scale, continue to monitor. -hypoglycemia likely due to poor po intake -d/c ISS but continue to check CBGs  Severe protein calorie malnutrition -continue supplements  Hypokalemia -repleted -mag 1.9  Leg pain and edema -venous duplex legs--neg  R>LUE edema -duplex RUE--NEG DVT  Goals of Care -palliative medicine consulted   Disposition Plan: SNFin 2-3 days when cleared by cardiology Family Communication: No family at bedside   Consultants: cardiology, palliative medicine  Code Status: DNR  DVT Prophylaxis: apixaban   Procedures: As Listed in Progress Note Above  Antibiotics: None   Subjective: Patient denies fevers, chills, headache, chest pain, dyspnea, nausea, vomiting, diarrhea, abdominal pain, dysuria,    Objective: Vitals:   05/12/17 2034 05/13/17 0628 05/13/17 0700 05/13/17 1500  BP: (!) 117/52 107/80  (!) 109/51  Pulse: 85 100  72  Resp: (!) 21 16  20   Temp: 97.8 F (36.6 C) (!) 97.5 F (36.4 C)  98.3 F (36.8 C)  TempSrc: Axillary Axillary  Oral  SpO2: 95% 99%  99%  Weight:   65.3 kg (143 lb 15.4 oz)   Height:        Intake/Output Summary (Last 24 hours) at 05/13/17 1645 Last data filed at 05/13/17 0600  Gross per 24 hour  Intake              480 ml  Output             1600 ml  Net            -1120 ml   Weight change: -0.7 kg (-1 lb 8.7 oz) Exam:   General:  Pt is alert, follows commands appropriately, not in acute distress  HEENT: No icterus, No thrush, No neck  mass, [place]/AT  Cardiovascular: IRRR, S1/S2, no rubs, no gallops  Respiratory: bibasilar crackle, no wheeze  Abdomen: Soft/+BS, non tender, non distended, no guarding  Extremities: 2 +LE edema, No lymphangitis, No petechiae, No rashes, no synovitis   Data Reviewed: I have personally reviewed following labs and imaging studies Basic  Metabolic Panel:  Recent Labs Lab 05/08/17 0600 05/09/17 0626 05/10/17 0623 05/11/17 0605 05/12/17 0700 05/13/17 0615  NA 141 138 139 139 139 139  K 3.5 3.3* 4.1 4.0 3.9 3.9  CL 97* 95* 94* 94* 97* 95*  CO2 36* 38* 38* 38* 37* 40*  GLUCOSE 85 92 98 80 79 99  BUN 28* 25* 34* 41* 40* 42*  CREATININE 0.64 0.55 0.63 0.60 0.53 0.63  CALCIUM 8.1* 7.9* 8.1* 8.1* 7.6* 7.9*  MG 1.7 1.9 1.9 1.9  --   --    Liver Function Tests:  Recent Labs Lab 05/13/17 0615  AST 25  ALT 20  ALKPHOS 66  BILITOT 0.7  PROT 4.9*  ALBUMIN 2.1*   No results for input(s): LIPASE, AMYLASE in the last 168 hours. No results for input(s): AMMONIA in the last 168 hours. Coagulation Profile: No results for input(s): INR, PROTIME in the last 168 hours. CBC:  Recent Labs Lab 05/13/17 0615  WBC 4.4  HGB 10.8*  HCT 35.0*  MCV 88.8  PLT 207   Cardiac Enzymes: No results for input(s): CKTOTAL, CKMB, CKMBINDEX, TROPONINI in the last 168 hours. BNP: Invalid input(s): POCBNP CBG:  Recent Labs Lab 05/12/17 1710 05/12/17 2041 05/13/17 0737 05/13/17 1132 05/13/17 1642  GLUCAP 110* 187* 107* 113* 131*   HbA1C: No results for input(s): HGBA1C in the last 72 hours. Urine analysis:    Component Value Date/Time   COLORURINE STRAW (A) 05/05/2017 1651   APPEARANCEUR CLEAR 05/05/2017 1651   LABSPEC 1.006 05/05/2017 1651   PHURINE 6.0 05/05/2017 1651   GLUCOSEU NEGATIVE 05/05/2017 1651   HGBUR SMALL (A) 05/05/2017 1651   BILIRUBINUR NEGATIVE 05/05/2017 1651   KETONESUR NEGATIVE 05/05/2017 1651   PROTEINUR NEGATIVE 05/05/2017 1651   UROBILINOGEN 0.2 04/19/2014 1730   NITRITE NEGATIVE 05/05/2017 1651   LEUKOCYTESUR TRACE (A) 05/05/2017 1651   Sepsis Labs: @LABRCNTIP (procalcitonin:4,lacticidven:4) ) Recent Results (from the past 240 hour(s))  Urine culture     Status: Abnormal   Collection Time: 05/05/17  4:51 PM  Result Value Ref Range Status   Specimen Description URINE, CLEAN CATCH  Final    Special Requests NONE  Final   Culture MULTIPLE SPECIES PRESENT, SUGGEST RECOLLECTION (A)  Final   Report Status 05/07/2017 FINAL  Final  MRSA PCR Screening     Status: None   Collection Time: 05/05/17  7:12 PM  Result Value Ref Range Status   MRSA by PCR NEGATIVE NEGATIVE Final    Comment:        The GeneXpert MRSA Assay (FDA approved for NASAL specimens only), is one component of a comprehensive MRSA colonization surveillance program. It is not intended to diagnose MRSA infection nor to guide or monitor treatment for MRSA infections.      Scheduled Meds: . apixaban  2.5 mg Oral BID  . atorvastatin  20 mg Oral q1800  . diltiazem  180 mg Oral Daily  . feeding supplement (PRO-STAT SUGAR FREE 64)  30 mL Oral TID WC  . furosemide  80 mg Intravenous Q8H  . gabapentin  200 mg Oral Q8H  . levothyroxine  25 mcg Oral QPM  . mirtazapine  7.5 mg Oral  QHS  . polyethylene glycol  17 g Oral Daily  . potassium chloride  40 mEq Oral Daily  . sodium chloride flush  3 mL Intravenous Q12H   Continuous Infusions: . sodium chloride      Procedures/Studies: US Venous Img Lower Bilateral  Result Date: 05/11/2017 CLINICAL DATA:  Chronic bilateral lower extremity pain and edema. History of breast cancer. Evaluate for DVT. EXAM: BILATERAL LOWER EXTREMITY VENOUS DOPPLER ULTRASOUND TECHNIQUE: Gray-scale sonography with graded compression, as well as color Doppler and duplex ultrasound were performed to evaluate the lower extremity deep venous systems from the level of the common femoral vein and including the common femoral, femoral, profunda femoral, popliteal and calf veins including the posterior tibial, peroneal and gastrocnemius veins when visible. The superficial great saphenous vein was also interrogated. Spectral Doppler was utilized to evaluate flow at rest and with distal augmentation maneuvers in the common femoral, femoral and popliteal veins. COMPARISON:  Left lower extremity venous  Doppler ultrasound - 03/04/2014 FINDINGS: RIGHT LOWER EXTREMITY Common Femoral Vein: No evidence of thrombus. Normal compressibility, respiratory phasicity and response to augmentation. Saphenofemoral Junction: No evidence of thrombus. Normal compressibility and flow on color Doppler imaging. Profunda Femoral Vein: No evidence of thrombus. Normal compressibility and flow on color Doppler imaging. Femoral Vein: No evidence of thrombus. Normal compressibility, respiratory phasicity and response to augmentation. Popliteal Vein: No evidence of thrombus. Normal compressibility, respiratory phasicity and response to augmentation. Calf Veins: No evidence of thrombus. Normal compressibility and flow on color Doppler imaging. Superficial Great Saphenous Vein: No evidence of thrombus. Normal compressibility and flow on color Doppler imaging. Venous Reflux:  None. Other Findings: Subcutaneous edema is noted throughout the right lower extremity. LEFT LOWER EXTREMITY Common Femoral Vein: No evidence of thrombus. Normal compressibility, respiratory phasicity and response to augmentation. Saphenofemoral Junction: No evidence of thrombus. Normal compressibility and flow on color Doppler imaging. Profunda Femoral Vein: No evidence of thrombus. Normal compressibility and flow on color Doppler imaging. Femoral Vein: No evidence of thrombus. Normal compressibility, respiratory phasicity and response to augmentation. Popliteal Vein: No evidence of thrombus. Normal compressibility, respiratory phasicity and response to augmentation. Calf Veins: No evidence of thrombus. Normal compressibility and flow on color Doppler imaging. Superficial Great Saphenous Vein: No evidence of thrombus. Normal compressibility and flow on color Doppler imaging. Venous Reflux:  None. Other Findings: Subcutaneous edema is noted throughout the left lower extremity. IMPRESSION: No evidence of DVT within either lower extremity. Electronically Signed   By: Sandi Mariscal M.D.   On: 05/11/2017 14:11   US Venous Img Upper Uni Right  Result Date: 05/12/2017 CLINICAL DATA:  Right arm swelling for several days EXAM: RIGHT UPPER EXTREMITY VENOUS DOPPLER ULTRASOUND TECHNIQUE: Gray-scale sonography with graded compression, as well as color Doppler and duplex ultrasound were performed to evaluate the upper extremity deep venous system from the level of the subclavian vein and including the jugular, axillary, basilic, radial, ulnar and upper cephalic vein. Spectral Doppler was utilized to evaluate flow at rest and with distal augmentation maneuvers. COMPARISON:  None. FINDINGS: Contralateral Subclavian Vein: Respiratory phasicity is normal and symmetric with the symptomatic side. No evidence of thrombus. Normal compressibility. Internal Jugular Vein: No evidence of thrombus. Normal compressibility, respiratory phasicity and response to augmentation. Subclavian Vein: No evidence of thrombus. Normal compressibility, respiratory phasicity and response to augmentation. Axillary Vein: No evidence of thrombus. Normal compressibility, respiratory phasicity and response to augmentation. Cephalic Vein: No evidence of thrombus. Normal compressibility, respiratory phasicity and response to augmentation.  Basilic Vein: No evidence of thrombus. Normal compressibility, respiratory phasicity and response to augmentation. Brachial Veins: No evidence of thrombus. Normal compressibility, respiratory phasicity and response to augmentation. Radial Veins: No evidence of thrombus. Normal compressibility, respiratory phasicity and response to augmentation. Ulnar Veins: No evidence of thrombus. Normal compressibility, respiratory phasicity and response to augmentation. Venous Reflux:  None visualized. Other Findings: PICC line is noted without evidence of deep venous thrombosis. IMPRESSION: No evidence of DVT within the right upper extremity. Electronically Signed   By: Inez Catalina M.D.   On: 05/12/2017  15:47   Dg Chest Port 1 View  Result Date: 05/13/2017 CLINICAL DATA:  Central catheter placement with bleeding around catheter site EXAM: PORTABLE CHEST 1 VIEW COMPARISON:  May 05, 2017 FINDINGS: Port-A-Cath tip is in the right atrium, approximately 6 cm distal to the cavoatrial junction. No pneumothorax. There is cardiomegaly with pulmonary venous hypertension. There are bilateral pleural effusions with generalized interstitial and patchy alveolar edema. There is aortic atherosclerosis. No adenopathy. There is degenerative change in each shoulder with superior migration of the right humeral head. IMPRESSION: Central catheter tip in right atrium. No pneumothorax. Underlying congestive heart failure. Edema and effusions appear stable compared to 1 day prior. There is aortic atherosclerosis. Chronic rotator cuff tear noted on the right with superior migration of the right humeral head. Aortic Atherosclerosis (ICD10-I70.0). Electronically Signed   By: Lowella Grip III M.D.   On: 05/13/2017 07:50   Dg Chest Port 1 View  Result Date: 05/05/2017 CLINICAL DATA:  Patient with swelling. EXAM: PORTABLE CHEST 1 VIEW COMPARISON:  Chest radiograph 03/07/2017. FINDINGS: Monitoring leads overlie the patient. Low lung volumes. Stable cardiomegaly with aortic atherosclerosis. Diffuse bilateral heterogeneous pulmonary opacities. Moderate layering bilateral pleural effusions. No pneumothorax. IMPRESSION: Findings most compatible with pulmonary edema and moderate layering bilateral pleural effusions. Cardiomegaly. Electronically Signed   By: Lovey Newcomer M.D.   On: 05/05/2017 15:03    Freedom Lopezperez, DO  [facility] Pager [phone]  If 7PM-7AM, please contact night-coverage [email or web address] Password [email or web address] 05/13/2017, 4:45 PM   LOS: 8 days

## 2017-05-13 NOTE — Plan of Care (Addendum)
[name] is resting quietly in bed. She greets me making and keeping eye contact. She is pleasantly confused. There is no family at bedside at this time. Call to son [name], left voice mail requesting meeting. Call to son Lynelle Smoke, left voicemail requesting meeting.  Return phone call from [name], I call him on his cell phone. He tells me that he is the healthcare power of [employer].  [name] states that his mother is confused because of her "medicine". He states, "her mind is sharp" and that if we get her off of the medicine she will be better. I share my worry over her frail state. He states that she needs to eat more.  I discuss low albumin of 2.1 and fluid overload. [name] states she needs to "eat a steak".  I share that we see the changes in hair and skin with aging, there are changes on the inside also. That sometimes people cannot use the protein that we give them.   I share my concern over 4 hospitalizations in 6 months. [name] states this is due to dehydration. That his mother is always asking for water, and the nursing home does not provide it. I ask what administration has said regarding this, he tells me "they pass the buck".  We talk about what's normal with dementia. [name] states that this is the first he has heard of dementia diagnosis.   We plan for a family meeting. [name] is unable/unwilling to meet until Thursday afternoon at 1430.  Marland Kitchen

## 2017-05-13 NOTE — Progress Notes (Signed)
Physical Therapy Treatment Patient Details Name: [name] MRN: [id number] DOB: 01/27/1921 Today's Date: 05/13/2017    History of Present Illness [name] is a 83 y.o. female with medical history significant of diastolic heart failure, hypertension, diabetes, atrial fibrillation on Eliquis, hypothyroidism, and CAD presenting with concern for new heart failure after being seen in the cardiology office.  Patient is unaccompanied and was unable to provide a thorough history.  She does not report SOB to me and denies cough, chest pain.    PT Comments    Patient tolerated sitting up at bedside for approximately 20 minutes demonstrating fair/poor balance with difficulty leaning forward due to low back stiffness.  Patient tolerated gentle stretching to low back by leaning forward and holding for 20-30 seconds.  Patient most limited due to extremely fearful of falling.  Patient will benefit from continued physical therapy in hospital and recommended venue below to increase strength, balance, endurance for safer ADLs and sitting tolerance.   Follow Up Recommendations  SNF;Supervision/Assistance - 24 hour     Equipment Recommendations  None recommended by PT    Recommendations for Other Services       Precautions / Restrictions Precautions Precautions: Fall Restrictions Weight Bearing Restrictions: No    Mobility  Bed Mobility Overal bed mobility: Needs Assistance Bed Mobility: Supine to Sit;Sit to Supine Rolling: Mod assist;Max assist   Supine to sit: Max assist Sit to supine: Max assist   General bed mobility comments: patient tends to lean backwards when anxious, but able to correct after verbal/tactile cueing  Transfers                    Ambulation/Gait                 Stairs            Wheelchair Mobility    Modified Rankin (Stroke Patients Only)       Balance Overall balance assessment: Needs assistance Sitting-balance support: No upper  extremity supported;Feet supported Sitting balance-Leahy Scale: Poor Sitting balance - Comments: able to keep trunk in midline for up to 2-3 minutes before falling backwards                                    Cognition Arousal/Alertness: Awake/alert Behavior During Therapy: WFL for tasks assessed/performed Overall Cognitive Status: History of cognitive impairments - at baseline                                        Exercises General Exercises - Lower Extremity Ankle Circles/Pumps: AROM;Both;15 reps;Supine Short Arc Quad: AROM;Both;15 reps;Supine Heel Slides: AAROM;Both;15 reps;Supine Hip ABduction/ADduction: AAROM;Both;15 reps;Supine    General Comments        Pertinent Vitals/Pain Pain Assessment: Faces Pain Score: 4  Pain Location: BLE Pain Descriptors / Indicators: Pressure Pain Intervention(s): Limited activity within patient's tolerance;Monitored during session    Home Living                      Prior Function            PT Goals (current goals can now be found in the care plan section) Acute Rehab PT Goals Patient Stated Goal: return home Time For Goal Achievement: 05/27/17 Potential to Achieve Goals: Fair Progress towards PT goals:  Progressing toward goals (progressing slowly)    Frequency    Min 2X/week      PT Plan Current plan remains appropriate    Co-evaluation              AM-PAC PT "6 Clicks" Daily Activity  Outcome Measure  Difficulty turning over in bed (including adjusting bedclothes, sheets and blankets)?: Unable Difficulty moving from lying on back to sitting on the side of the bed? : Unable Difficulty sitting down on and standing up from a chair with arms (e.g., wheelchair, bedside commode, etc,.)?: Unable Help needed moving to and from a bed to chair (including a wheelchair)?: Total Help needed walking in hospital room?: Total Help needed climbing 3-5 steps with a railing? : Total 6  Click Score: 6    End of Session Equipment Utilized During Treatment: Oxygen Activity Tolerance: Patient limited by fatigue Patient left: in bed;with call bell/phone within reach;with bed alarm set Nurse Communication: Mobility status PT Visit Diagnosis: Unsteadiness on feet (R26.81);Other abnormalities of gait and mobility (R26.89);Muscle weakness (generalized) (M62.81)     Time: 4239-5320 PT Time Calculation (min) (ACUTE ONLY): 31 min  Charges:  $Therapeutic Activity: 23-37 mins                    G Codes:       1:13 PM, 02-Jun-2017 Lonell Grandchild, MPT Physical Therapist with [facility] [phone] office [phone] mobile phone

## 2017-05-14 DIAGNOSIS — E43 Unspecified severe protein-calorie malnutrition: Secondary | ICD-10-CM

## 2017-05-14 DIAGNOSIS — I10 Essential (primary) hypertension: Secondary | ICD-10-CM

## 2017-05-14 DIAGNOSIS — E7849 Other hyperlipidemia: Secondary | ICD-10-CM

## 2017-05-14 DIAGNOSIS — I482 Chronic atrial fibrillation: Secondary | ICD-10-CM

## 2017-05-14 DIAGNOSIS — E119 Type 2 diabetes mellitus without complications: Secondary | ICD-10-CM

## 2017-05-14 LAB — GLUCOSE, CAPILLARY
Glucose-Capillary: 105 mg/dL — ABNORMAL HIGH (ref 65–99)
Glucose-Capillary: 113 mg/dL — ABNORMAL HIGH (ref 65–99)
Glucose-Capillary: 146 mg/dL — ABNORMAL HIGH (ref 65–99)

## 2017-05-14 LAB — BASIC METABOLIC PANEL
Anion gap: 6 (ref 5–15)
BUN: 40 mg/dL — ABNORMAL HIGH (ref 6–20)
CALCIUM: 8.2 mg/dL — AB (ref 8.9–10.3)
CHLORIDE: 93 mmol/L — AB (ref 101–111)
CO2: 38 mmol/L — AB (ref 22–32)
CREATININE: 0.62 mg/dL (ref 0.44–1.00)
GFR calc non Af Amer: >60 mL/min (ref >60)
Glucose, Bld: 161 mg/dL — ABNORMAL HIGH (ref 65–99)
Potassium: 3.9 mmol/L (ref 3.5–5.1)
SODIUM: 137 mmol/L (ref 135–145)

## 2017-05-14 MED ORDER — MORPHINE SULFATE (PF) 2 MG/ML IV SOLN
0.2500 mg | Freq: Once | INTRAVENOUS | Status: AC
  Administered 2017-05-15: 0.25 mg via INTRAVENOUS
  Filled 2017-05-14: qty 1

## 2017-05-14 NOTE — Progress Notes (Addendum)
Progress Note  Patient Name: [name] Date of Encounter: 05/14/2017  Primary Cardiologist: Dr. Harrington Challenger  Subjective   Says her stomach hurts but sleeping comfortably when I walked in.  Inpatient Medications    Scheduled Meds: . apixaban  2.5 mg Oral BID  . atorvastatin  20 mg Oral q1800  . diltiazem  180 mg Oral Daily  . feeding supplement (PRO-STAT SUGAR FREE 64)  30 mL Oral TID WC  . furosemide  80 mg Intravenous Q8H  . gabapentin  200 mg Oral Q8H  . levothyroxine  25 mcg Oral QPM  . mirtazapine  7.5 mg Oral QHS  . polyethylene glycol  17 g Oral Daily  . potassium chloride  40 mEq Oral Daily  . sodium chloride flush  3 mL Intravenous Q12H   Continuous Infusions: . sodium chloride     PRN Meds: sodium chloride, acetaminophen, ondansetron (ZOFRAN) IV, sodium chloride flush   Vital Signs    Vitals:   05/13/17 2011 05/13/17 2257 05/14/17 0449 05/14/17 0600  BP:  120/66  118/69  Pulse:  74  98  Resp:  18  18  Temp:  97.6 F (36.4 C)  97.6 F (36.4 C)  TempSrc:  Oral  Axillary  SpO2: 97% 99%  98%  Weight:   143 lb 11.8 oz (65.2 kg)   Height:        Intake/Output Summary (Last 24 hours) at 05/14/17 1058 Last data filed at 05/14/17 0500  Gross per 24 hour  Intake              480 ml  Output             1800 ml  Net            -1320 ml   Filed Weights   05/12/17 0500 05/13/17 0700 05/14/17 0449  Weight: 145 lb 8.1 oz (66 kg) 143 lb 15.4 oz (65.3 kg) 143 lb 11.8 oz (65.2 kg)    Telemetry    Afib 90-120/m- Personally Reviewed  ECG      Physical Exam   GEN: No acute distress.   Neck: No JVD Cardiac:Irregular irregular with for over 6 harsh systolic murmur at the left sternal border Respiratory:  decreased breath sounds with basilar crackles GI: Soft, nontender, non-distended  MS:  +1-2 edema; No deformity. Neuro:  Nonfocal  Psych: Normal affect   Labs    Chemistry Recent Labs Lab 05/11/17 0605 05/12/17 0700 05/13/17 0615  NA 139 139 139    K 4.0 3.9 3.9  CL 94* 97* 95*  CO2 38* 37* 40*  GLUCOSE 80 79 99  BUN 41* 40* 42*  CREATININE 0.60 0.53 0.63  CALCIUM 8.1* 7.6* 7.9*  PROT  --   --  4.9*  ALBUMIN  --   --  2.1*  AST  --   --  25  ALT  --   --  20  ALKPHOS  --   --  66  BILITOT  --   --  0.7  GFRNONAA >60 >60 >60  GFRAA >60 >60 >60  ANIONGAP 7 5 4*     Hematology Recent Labs Lab 05/13/17 0615  WBC 4.4  RBC 3.94  HGB 10.8*  HCT 35.0*  MCV 88.8  MCH 27.4  MCHC 30.9  RDW 20.2*  PLT 207    Cardiac EnzymesNo results for input(s): TROPONINI in the last 168 hours. No results for input(s): TROPIPOC in the last 168 hours.  BNPNo results for input(s): BNP, PROBNP in the last 168 hours.   DDimer No results for input(s): DDIMER in the last 168 hours.   Radiology    US Venous Img Upper Uni Right  Result Date: 05/12/2017 CLINICAL DATA:  Right arm swelling for several days EXAM: RIGHT UPPER EXTREMITY VENOUS DOPPLER ULTRASOUND TECHNIQUE: Gray-scale sonography with graded compression, as well as color Doppler and duplex ultrasound were performed to evaluate the upper extremity deep venous system from the level of the subclavian vein and including the jugular, axillary, basilic, radial, ulnar and upper cephalic vein. Spectral Doppler was utilized to evaluate flow at rest and with distal augmentation maneuvers. COMPARISON:  None. FINDINGS: Contralateral Subclavian Vein: Respiratory phasicity is normal and symmetric with the symptomatic side. No evidence of thrombus. Normal compressibility. Internal Jugular Vein: No evidence of thrombus. Normal compressibility, respiratory phasicity and response to augmentation. Subclavian Vein: No evidence of thrombus. Normal compressibility, respiratory phasicity and response to augmentation. Axillary Vein: No evidence of thrombus. Normal compressibility, respiratory phasicity and response to augmentation. Cephalic Vein: No evidence of thrombus. Normal compressibility, respiratory  phasicity and response to augmentation. Basilic Vein: No evidence of thrombus. Normal compressibility, respiratory phasicity and response to augmentation. Brachial Veins: No evidence of thrombus. Normal compressibility, respiratory phasicity and response to augmentation. Radial Veins: No evidence of thrombus. Normal compressibility, respiratory phasicity and response to augmentation. Ulnar Veins: No evidence of thrombus. Normal compressibility, respiratory phasicity and response to augmentation. Venous Reflux:  None visualized. Other Findings: PICC line is noted without evidence of deep venous thrombosis. IMPRESSION: No evidence of DVT within the right upper extremity. Electronically Signed   By: Inez Catalina M.D.   On: 05/12/2017 15:47   Dg Chest Port 1 View  Result Date: 05/13/2017 CLINICAL DATA:  Central catheter placement with bleeding around catheter site EXAM: PORTABLE CHEST 1 VIEW COMPARISON:  May 05, 2017 FINDINGS: Port-A-Cath tip is in the right atrium, approximately 6 cm distal to the cavoatrial junction. No pneumothorax. There is cardiomegaly with pulmonary venous hypertension. There are bilateral pleural effusions with generalized interstitial and patchy alveolar edema. There is aortic atherosclerosis. No adenopathy. There is degenerative change in each shoulder with superior migration of the right humeral head. IMPRESSION: Central catheter tip in right atrium. No pneumothorax. Underlying congestive heart failure. Edema and effusions appear stable compared to 1 day prior. There is aortic atherosclerosis. Chronic rotator cuff tear noted on the right with superior migration of the right humeral head. Aortic Atherosclerosis (ICD10-I70.0). Electronically Signed   By: Lowella Grip III M.D.   On: 05/13/2017 07:50    Cardiac Studies     Echocardiogram (05/06/17):   - Left ventricle: The cavity size was normal. Wall thickness was   increased in a pattern of moderate LVH. Systolic function  was   normal. The estimated ejection fraction was in the range of 60%   to 65%. Wall motion was normal; there were no regional wall   motion abnormalities. - Aortic valve: Severely calcified annulus. Trileaflet; severely   thickened leaflets. There was moderate stenosis. Lower gradient   than expected for moderate AS likely due to low stroke volume   index (paradoxical low flow low gradient AS). Morphologically and   by AVA VTI there is at least moderate AS. Mean gradient (S): 7 mm   Hg. VTI ratio of LVOT to aortic valve: 0.37. Valve area (VTI):   1.17 cm^2. Valve area (Vmax): 0.91 cm^2. - Mitral valve: Moderately calcified annulus. Mildly thickened  leaflets . There was moderate regurgitation. The MR is eccentric   and may be underestimated. The MR VC is 0.4 cm. - Left atrium: The atrium was massively dilated. - Right ventricle: The ventricular septum is flattened in systole   consistent with RV pressure overload. The cavity size was   moderately dilated. Systolic function was moderately reduced.   TAPSE: 10 mm . - Right atrium: The atrium was massively dilated. - Atrial septum: No defect or patent foramen ovale was identified. - Tricuspid valve: There was mild-moderate regurgitation. - Pulmonary arteries: Systolic pressure was severely increased. PA   peak pressure: 79 mm Hg (S). - Inferior vena cava: The vessel was dilated. The respirophasic   diameter changes were blunted (< 50%), consistent with elevated   central venous pressure. - Pericardium, extracardiac: There is a large left pleural   effusion.   Patient Profile     83 y.o. female with known history of atrial fib, CAD, HTN, seen in cardiology office on 05/05/17 and found to have significant volume overload, sent to hospital for IV diuresis.      Assessment & Plan    Acute on chronic diastolic CHF with right heart failure and severe pulmonary hypertension. On Lasix 80 mg IV 3 times a day. -1320 cc and creatinine  stable. Weight 143 pounds question dry weight of 125 pounds.  Atrial fibrillation with some rapid rates up to 120 bpm. He did have heart rates down in the 40s on 9/28 with pauses so bisoprolol was stopped. Poor pacemaker candidate. Continue diltiazem 180 mg daily. May have to increase to 240 mg daily. On Eliquis.   NSVT resolved with Mg replacement.   For questions or updates, please contact [place] Please consult [email or web address] for contact info under Cardiology/STEMI.      Signed, Ermalinda Barrios, PA-C  05/14/2017, 10:58 AM    The patient was seen and examined, and I agree with the history, physical exam, assessment and plan as documented above, with modifications as noted below.  She continues to have good urine output on current diuretic regimen with over 1 L output in last 24 hrs, although amount is gradually declining. I, too, question reported dry weight of 125 lbs. She still has lower extremity edema. I ordered a BMET, BUN is 40 (overall renal function is stable). BP is stable. HR's are variable.  Recommendations: For now, continue present medical therapy. Follow renal function and urinary output tomorrow and consider decreasing diuretic dosage and frequency.  Kate Sable, MD, [facility]  05/14/2017 3:41 PM

## 2017-05-14 NOTE — Progress Notes (Signed)
PROGRESS NOTE  [name] WLN:[id number] DOB: 1921-07-05 DOA: 05/05/2017 PCP: Hilbert Corrigan, MD  Brief History: 83 y.o.femalehistory of afib, CAD with stent to LAD in 7408, chronic diastolic HF, HTN, hypothyroidism, hyperlipidemia, dementiaadmitted with lower extremity edema, weight gain. The family  reports gradual increase in swelling over the last several daysprior to admission. The patient was seen in the cardiology office on 05/05/2018. The patient was noted to have anasarca and worsening lower extremity edema. The patient was started on IV lasix. Cardiology was consulted to assist.  Assessment/Plan: Acute on chronic diastolic CHF -1.4G for the admission -Patient had dry weight of 123-125 in July 2019 -IV furosemide increased to 80 mg IV every 8 hours 05/11/17 -05/06/2017 echo EF--60-65%, RV pressure overload with moderate reduction in RV function. PASP 79 -NEG 12 lbs for the admit -remains fluid overloaded -bisoprolol stopped due to sinus pauses-->improved -d/c lisinopril and imdur due to soft BPs -using smaller cuff  -appreciate cardiology follow up -am BMP  Right Arm Erythema -has been transient and intermittent during hospitalization with blanchable and non-blanchable components -likely related to venous stasis from anasarca -will not start antibiotics as pt is afebrile and hemodynamically stable without leukocytosis   Permanent atrial fibrillation -Ratecontrolled -Continue diltiazem CD -CHADSVASc = 6 -continue apixaban  Sinus pauses/sinus bradycardia -resting HR in low 40s with up to 3 sec pauses -d/c bisoprolol-->improved -continue dilt -Heart rates remained variable, having episodes of tachycardia and bradycardia -Not felt to be good candidate for pacemaker  Dementia -at baseline, no acute behavioral disturbances.  Hyperlipidemia -Continue Lipitor.  Hypertension -BPs soft now with diuresis -d/c bisoprolol due to  pauses -d/c lisinopril and imdur due to soft BPs -Blood pressure now stable  DiabetesMellitus type 2 -Hold metformin. -03/08/17 A1C - 7.2 -Has had several instances of hypoglycemia, only receiving a sensitive sliding scale, continue to monitor. -hypoglycemia likely due to poor po intake -d/c ISS but continue to check CBGs  Severe protein calorie malnutrition -continue supplements  Hypokalemia -repleted -mag 1.9  Leg pain and edema -venous duplex legs--neg  R>LUE edema -duplex RUE--NEG DVT  Goals of Care -palliative medicine consulted   Disposition Plan: SNFin 2-3 days when cleared by cardiology Family Communication: No family at bedside   Consultants: cardiology, palliative medicine  Code Status: DNR  DVT Prophylaxis: apixaban   Procedures: As Listed in Progress Note Above  Antibiotics: None   Subjective: Denies any chest pain, nausea, vomiting, shortness of breath. Feels weak   Objective: Vitals:   05/13/17 2257 05/14/17 0449 05/14/17 0600 05/14/17 1400  BP: 120/66  118/69 106/76  Pulse: 74  98 (!) 120  Resp: 18  18 16   Temp: 97.6 F (36.4 C)  97.6 F (36.4 C) 98.4 F (36.9 C)  TempSrc: Oral  Axillary Oral  SpO2: 99%  98% 94%  Weight:  65.2 kg (143 lb 11.8 oz)    Height:        Intake/Output Summary (Last 24 hours) at 05/14/17 1845 Last data filed at 05/14/17 1755  Gross per 24 hour  Intake              240 ml  Output             2800 ml  Net            -2560 ml   Weight change: -0.1 kg (-3.5 oz) Exam:   General:  Pt is alert, follows commands appropriately, not in  acute distress  HEENT: No icterus, No thrush, No neck mass, [place]/AT  Cardiovascular: IRRR, S1/S2, no rubs, no gallops  Respiratory: bibasilar crackle, no wheeze  Abdomen: Soft/+BS, non tender, non distended, no guarding  Extremities: 2 +LE edema, No lymphangitis, No petechiae, No rashes, no synovitis   Data Reviewed: I have personally reviewed  following labs and imaging studies Basic Metabolic Panel:  Recent Labs Lab 05/08/17 0600 05/09/17 0626 05/10/17 0623 05/11/17 0605 05/12/17 0700 05/13/17 0615 05/14/17 1206  NA 141 138 139 139 139 139 137  K 3.5 3.3* 4.1 4.0 3.9 3.9 3.9  CL 97* 95* 94* 94* 97* 95* 93*  CO2 36* 38* 38* 38* 37* 40* 38*  GLUCOSE 85 92 98 80 79 99 161*  BUN 28* 25* 34* 41* 40* 42* 40*  CREATININE 0.64 0.55 0.63 0.60 0.53 0.63 0.62  CALCIUM 8.1* 7.9* 8.1* 8.1* 7.6* 7.9* 8.2*  MG 1.7 1.9 1.9 1.9  --   --   --    Liver Function Tests:  Recent Labs Lab 05/13/17 0615  AST 25  ALT 20  ALKPHOS 66  BILITOT 0.7  PROT 4.9*  ALBUMIN 2.1*   No results for input(s): LIPASE, AMYLASE in the last 168 hours. No results for input(s): AMMONIA in the last 168 hours. Coagulation Profile: No results for input(s): INR, PROTIME in the last 168 hours. CBC:  Recent Labs Lab 05/13/17 0615  WBC 4.4  HGB 10.8*  HCT 35.0*  MCV 88.8  PLT 207   Cardiac Enzymes: No results for input(s): CKTOTAL, CKMB, CKMBINDEX, TROPONINI in the last 168 hours. BNP: Invalid input(s): POCBNP CBG:  Recent Labs Lab 05/13/17 1132 05/13/17 1642 05/13/17 2032 05/14/17 0820 05/14/17 1648  GLUCAP 113* 131* 244* 105* 113*   HbA1C: No results for input(s): HGBA1C in the last 72 hours. Urine analysis:    Component Value Date/Time   COLORURINE STRAW (A) 05/05/2017 1651   APPEARANCEUR CLEAR 05/05/2017 1651   LABSPEC 1.006 05/05/2017 1651   PHURINE 6.0 05/05/2017 1651   GLUCOSEU NEGATIVE 05/05/2017 1651   HGBUR SMALL (A) 05/05/2017 1651   BILIRUBINUR NEGATIVE 05/05/2017 1651   KETONESUR NEGATIVE 05/05/2017 1651   PROTEINUR NEGATIVE 05/05/2017 1651   UROBILINOGEN 0.2 04/19/2014 1730   NITRITE NEGATIVE 05/05/2017 1651   LEUKOCYTESUR TRACE (A) 05/05/2017 1651   Sepsis Labs: @LABRCNTIP (procalcitonin:4,lacticidven:4) ) Recent Results (from the past 240 hour(s))  Urine culture     Status: Abnormal   Collection Time:  05/05/17  4:51 PM  Result Value Ref Range Status   Specimen Description URINE, CLEAN CATCH  Final   Special Requests NONE  Final   Culture MULTIPLE SPECIES PRESENT, SUGGEST RECOLLECTION (A)  Final   Report Status 05/07/2017 FINAL  Final  MRSA PCR Screening     Status: None   Collection Time: 05/05/17  7:12 PM  Result Value Ref Range Status   MRSA by PCR NEGATIVE NEGATIVE Final    Comment:        The GeneXpert MRSA Assay (FDA approved for NASAL specimens only), is one component of a comprehensive MRSA colonization surveillance program. It is not intended to diagnose MRSA infection nor to guide or monitor treatment for MRSA infections.      Scheduled Meds: . apixaban  2.5 mg Oral BID  . atorvastatin  20 mg Oral q1800  . diltiazem  180 mg Oral Daily  . feeding supplement (PRO-STAT SUGAR FREE 64)  30 mL Oral TID WC  . furosemide  80 mg Intravenous Q8H  .  gabapentin  200 mg Oral Q8H  . levothyroxine  25 mcg Oral QPM  . mirtazapine  7.5 mg Oral QHS  . polyethylene glycol  17 g Oral Daily  . potassium chloride  40 mEq Oral Daily  . sodium chloride flush  3 mL Intravenous Q12H   Continuous Infusions: . sodium chloride      Procedures/Studies: US Venous Img Lower Bilateral  Result Date: 05/11/2017 CLINICAL DATA:  Chronic bilateral lower extremity pain and edema. History of breast cancer. Evaluate for DVT. EXAM: BILATERAL LOWER EXTREMITY VENOUS DOPPLER ULTRASOUND TECHNIQUE: Gray-scale sonography with graded compression, as well as color Doppler and duplex ultrasound were performed to evaluate the lower extremity deep venous systems from the level of the common femoral vein and including the common femoral, femoral, profunda femoral, popliteal and calf veins including the posterior tibial, peroneal and gastrocnemius veins when visible. The superficial great saphenous vein was also interrogated. Spectral Doppler was utilized to evaluate flow at rest and with distal augmentation  maneuvers in the common femoral, femoral and popliteal veins. COMPARISON:  Left lower extremity venous Doppler ultrasound - 03/04/2014 FINDINGS: RIGHT LOWER EXTREMITY Common Femoral Vein: No evidence of thrombus. Normal compressibility, respiratory phasicity and response to augmentation. Saphenofemoral Junction: No evidence of thrombus. Normal compressibility and flow on color Doppler imaging. Profunda Femoral Vein: No evidence of thrombus. Normal compressibility and flow on color Doppler imaging. Femoral Vein: No evidence of thrombus. Normal compressibility, respiratory phasicity and response to augmentation. Popliteal Vein: No evidence of thrombus. Normal compressibility, respiratory phasicity and response to augmentation. Calf Veins: No evidence of thrombus. Normal compressibility and flow on color Doppler imaging. Superficial Great Saphenous Vein: No evidence of thrombus. Normal compressibility and flow on color Doppler imaging. Venous Reflux:  None. Other Findings: Subcutaneous edema is noted throughout the right lower extremity. LEFT LOWER EXTREMITY Common Femoral Vein: No evidence of thrombus. Normal compressibility, respiratory phasicity and response to augmentation. Saphenofemoral Junction: No evidence of thrombus. Normal compressibility and flow on color Doppler imaging. Profunda Femoral Vein: No evidence of thrombus. Normal compressibility and flow on color Doppler imaging. Femoral Vein: No evidence of thrombus. Normal compressibility, respiratory phasicity and response to augmentation. Popliteal Vein: No evidence of thrombus. Normal compressibility, respiratory phasicity and response to augmentation. Calf Veins: No evidence of thrombus. Normal compressibility and flow on color Doppler imaging. Superficial Great Saphenous Vein: No evidence of thrombus. Normal compressibility and flow on color Doppler imaging. Venous Reflux:  None. Other Findings: Subcutaneous edema is noted throughout the left lower  extremity. IMPRESSION: No evidence of DVT within either lower extremity. Electronically Signed   By: Sandi Mariscal M.D.   On: 05/11/2017 14:11   US Venous Img Upper Uni Right  Result Date: 05/12/2017 CLINICAL DATA:  Right arm swelling for several days EXAM: RIGHT UPPER EXTREMITY VENOUS DOPPLER ULTRASOUND TECHNIQUE: Gray-scale sonography with graded compression, as well as color Doppler and duplex ultrasound were performed to evaluate the upper extremity deep venous system from the level of the subclavian vein and including the jugular, axillary, basilic, radial, ulnar and upper cephalic vein. Spectral Doppler was utilized to evaluate flow at rest and with distal augmentation maneuvers. COMPARISON:  None. FINDINGS: Contralateral Subclavian Vein: Respiratory phasicity is normal and symmetric with the symptomatic side. No evidence of thrombus. Normal compressibility. Internal Jugular Vein: No evidence of thrombus. Normal compressibility, respiratory phasicity and response to augmentation. Subclavian Vein: No evidence of thrombus. Normal compressibility, respiratory phasicity and response to augmentation. Axillary Vein: No evidence of thrombus. Normal  compressibility, respiratory phasicity and response to augmentation. Cephalic Vein: No evidence of thrombus. Normal compressibility, respiratory phasicity and response to augmentation. Basilic Vein: No evidence of thrombus. Normal compressibility, respiratory phasicity and response to augmentation. Brachial Veins: No evidence of thrombus. Normal compressibility, respiratory phasicity and response to augmentation. Radial Veins: No evidence of thrombus. Normal compressibility, respiratory phasicity and response to augmentation. Ulnar Veins: No evidence of thrombus. Normal compressibility, respiratory phasicity and response to augmentation. Venous Reflux:  None visualized. Other Findings: PICC line is noted without evidence of deep venous thrombosis. IMPRESSION: No evidence  of DVT within the right upper extremity. Electronically Signed   By: Inez Catalina M.D.   On: 05/12/2017 15:47   Dg Chest Port 1 View  Result Date: 05/13/2017 CLINICAL DATA:  Central catheter placement with bleeding around catheter site EXAM: PORTABLE CHEST 1 VIEW COMPARISON:  May 05, 2017 FINDINGS: Port-A-Cath tip is in the right atrium, approximately 6 cm distal to the cavoatrial junction. No pneumothorax. There is cardiomegaly with pulmonary venous hypertension. There are bilateral pleural effusions with generalized interstitial and patchy alveolar edema. There is aortic atherosclerosis. No adenopathy. There is degenerative change in each shoulder with superior migration of the right humeral head. IMPRESSION: Central catheter tip in right atrium. No pneumothorax. Underlying congestive heart failure. Edema and effusions appear stable compared to 1 day prior. There is aortic atherosclerosis. Chronic rotator cuff tear noted on the right with superior migration of the right humeral head. Aortic Atherosclerosis (ICD10-I70.0). Electronically Signed   By: Lowella Grip III M.D.   On: 05/13/2017 07:50   Dg Chest Port 1 View  Result Date: 05/05/2017 CLINICAL DATA:  Patient with swelling. EXAM: PORTABLE CHEST 1 VIEW COMPARISON:  Chest radiograph 03/07/2017. FINDINGS: Monitoring leads overlie the patient. Low lung volumes. Stable cardiomegaly with aortic atherosclerosis. Diffuse bilateral heterogeneous pulmonary opacities. Moderate layering bilateral pleural effusions. No pneumothorax. IMPRESSION: Findings most compatible with pulmonary edema and moderate layering bilateral pleural effusions. Cardiomegaly. Electronically Signed   By: Lovey Newcomer M.D.   On: 05/05/2017 15:03    [place] Pager [phone]  If 7PM-7AM, please contact night-coverage [email or web address] Password [email or web address] 05/14/2017, 6:45 PM   LOS: 9 days

## 2017-05-14 NOTE — Progress Notes (Signed)
Occupational Therapy Treatment Patient Details Name: [name] MRN: [id number] DOB: [birth year] Today's Date: 05/14/2017    History of present illness [name] is a 83 y.o. female with medical history significant of diastolic heart failure, hypertension, diabetes, atrial fibrillation on Eliquis, hypothyroidism, and CAD presenting with concern for new heart failure after being seen in the cardiology office.  Patient is unaccompanied and was unable to provide a thorough history.  She does not report SOB to me and denies cough, chest pain.   OT comments  Pt received semi-reclined in bed, finishing breakfast. Pt alert and willing to participate in treatment session today, this is the first day since evaluation that pt has been alert enough to complete a treatment session. Pt able to follow instructions with verbal cuing for exercise technique. Pt has limited ROM in LUE, strength is poor in BUE. Rest breaks required during exercises due to fatigue. Pt completing ADLs at bed level with set-up. Discharge to SNF remains appropriate, goals for acute care have been downgraded due to limited participation in therapy sessions.   Follow Up Recommendations  SNF    Equipment Recommendations  None recommended by OT       Precautions / Restrictions Precautions Precautions: Fall Restrictions Weight Bearing Restrictions: No       Mobility Bed Mobility               General bed mobility comments: not completed  Transfers                 General transfer comment: not completed        ADL either performed or assessed with clinical judgement   ADL Overall ADL's : Needs assistance/impaired Eating/Feeding: Set up;Bed level   Grooming: Wash/dry face;Brushing hair;Set up;Bed level Grooming Details (indicate cue type and reason): Set-up for grooming tasks. Pt uses BUE for hair brushing, RUE for washing face                                                Cognition Arousal/Alertness: Awake/alert Behavior During Therapy: WFL for tasks assessed/performed Overall Cognitive Status: History of cognitive impairments - at baseline                                          Exercises Exercises: General Upper Extremity General Exercises - Upper Extremity Shoulder Flexion: PROM;AROM;10 reps Shoulder Horizontal ABduction: PROM;AROM;10 reps Shoulder Horizontal ADduction: PROM;AROM;10 reps Elbow Flexion: PROM;AROM;10 reps Elbow Extension: PROM;AROM;10 reps Digit Composite Flexion: AROM;10 reps Composite Extension: AROM;10 reps           Pertinent Vitals/ Pain       Pain Assessment: No/denies pain         Frequency  Min 2X/week        Progress Toward Goals  OT Goals(current goals can now be found in the care plan section)  Progress towards OT goals: Not progressing toward goals - comment;Goals drowngraded-see care plan (pt has not been alert enough to participate until today)  Acute Rehab OT Goals Patient Stated Goal: return home OT Goal Formulation: With patient Time For Goal Achievement: 05/20/17 Potential to Achieve Goals: Fair ADL Goals Pt Will Perform Grooming: with set-up;sitting Pt Will Transfer to Toilet: with max assist;stand pivot transfer;bedside  commode Pt/caregiver will Perform Home Exercise Program: Increased strength;Both right and left upper extremity;With minimal assist;With written HEP provided  Plan Discharge plan remains appropriate          End of Session    OT Visit Diagnosis: Muscle weakness (generalized) (M62.81)   Activity Tolerance Patient tolerated treatment well   Patient Left in bed;with call bell/phone within reach;with bed alarm set           Time: 2081-3887 OT Time Calculation (min): 20 min  Charges: OT General Charges $OT Visit: 1 Visit OT Treatments $Self Care/Home Management : 8-22 mins $Therapeutic Exercise: 8-22 mins    Guadelupe Sabin, OTR/L   [phone] 05/14/2017, 9:03 AM

## 2017-05-15 ENCOUNTER — Inpatient Hospital Stay (HOSPITAL_COMMUNITY): Payer: Medicare Other

## 2017-05-15 DIAGNOSIS — Z7189 Other specified counseling: Secondary | ICD-10-CM

## 2017-05-15 DIAGNOSIS — I509 Heart failure, unspecified: Secondary | ICD-10-CM

## 2017-05-15 DIAGNOSIS — Z7901 Long term (current) use of anticoagulants: Secondary | ICD-10-CM

## 2017-05-15 DIAGNOSIS — I481 Persistent atrial fibrillation: Secondary | ICD-10-CM

## 2017-05-15 LAB — GLUCOSE, CAPILLARY
GLUCOSE-CAPILLARY: 144 mg/dL — AB (ref 65–99)
Glucose-Capillary: 105 mg/dL — ABNORMAL HIGH (ref 65–99)
Glucose-Capillary: 147 mg/dL — ABNORMAL HIGH (ref 65–99)
Glucose-Capillary: 120 mg/dL — ABNORMAL HIGH (ref 65–99)

## 2017-05-15 NOTE — Care Management Note (Signed)
Case Management Note  Patient Details  Name: [name] MRN: [id number] Date of Birth: [birth year]  If discussed at Long Length of Stay Meetings, dates discussed:  05/15/2017   Sherald Barge, RN 05/15/2017, 2:22 PM
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
## 2017-05-15 NOTE — Progress Notes (Signed)
Progress Note  Patient Name: [name] Date of Encounter: 05/15/2017  Primary Cardiologist: Dr. Dorris Carnes Consulting Cardiologist: Dr. Kate Sable  Subjective   Feels weak, mild abdominal fullness. No shortness of breath at rest. No chest pain.  Inpatient Medications    Scheduled Meds: . apixaban  2.5 mg Oral BID  . atorvastatin  20 mg Oral q1800  . diltiazem  180 mg Oral Daily  . feeding supplement (PRO-STAT SUGAR FREE 64)  30 mL Oral TID WC  . furosemide  80 mg Intravenous Q8H  . gabapentin  200 mg Oral Q8H  . levothyroxine  25 mcg Oral QPM  . mirtazapine  7.5 mg Oral QHS  . polyethylene glycol  17 g Oral Daily  . potassium chloride  40 mEq Oral Daily  . sodium chloride flush  3 mL Intravenous Q12H   Continuous Infusions: . sodium chloride     PRN Meds: sodium chloride, acetaminophen, ondansetron (ZOFRAN) IV, sodium chloride flush   Vital Signs    Vitals:   05/14/17 1400 05/14/17 2013 05/14/17 2156 05/15/17 0558  BP: 106/76  107/72 119/70  Pulse: (!) 120  (!) 110 100  Resp: 16  16 16   Temp: 98.4 F (36.9 C)  98.3 F (36.8 C) 98.6 F (37 C)  TempSrc: Oral  Oral Oral  SpO2: 94% (!) 88% 96% 90%  Weight:    142 lb 6.7 oz (64.6 kg)  Height:        Intake/Output Summary (Last 24 hours) at 05/15/17 1203 Last data filed at 05/15/17 0900  Gross per 24 hour  Intake              600 ml  Output             1450 ml  Net             -850 ml   Filed Weights   05/13/17 0700 05/14/17 0449 05/15/17 0558  Weight: 143 lb 15.4 oz (65.3 kg) 143 lb 11.8 oz (65.2 kg) 142 lb 6.7 oz (64.6 kg)    Telemetry    Course atrial fibrillation. Personally reviewed.  Physical Exam   GEN: Elderly woman. Chronically ill-appearing. No acute distress.   Neck:  Elevated JVD. Cardiac:  Irregularly irregular, 2/6 systolic murmur.  Respiratory: Nonlabored. Clear to auscultation bilaterally. GI:  Protuberant, bowel sounds present. MS:  Bilateral arm and leg edema, dressings  on heels bilaterally; No deformity.  Labs    Chemistry  Recent Labs Lab 05/12/17 0700 05/13/17 0615 05/14/17 1206  NA 139 139 137  K 3.9 3.9 3.9  CL 97* 95* 93*  CO2 37* 40* 38*  GLUCOSE 79 99 161*  BUN 40* 42* 40*  CREATININE 0.53 0.63 0.62  CALCIUM 7.6* 7.9* 8.2*  PROT  --  4.9*  --   ALBUMIN  --  2.1*  --   AST  --  25  --   ALT  --  20  --   ALKPHOS  --  66  --   BILITOT  --  0.7  --   GFRNONAA >60 >60 >60  GFRAA >60 >60 >60  ANIONGAP 5 4* 6     Hematology  Recent Labs Lab 05/13/17 0615  WBC 4.4  RBC 3.94  HGB 10.8*  HCT 35.0*  MCV 88.8  MCH 27.4  MCHC 30.9  RDW 20.2*  PLT 207    Radiology    Dg Abd Acute W/chest  Result Date: 05/15/2017 CLINICAL DATA:  Abdominal pain. History of CHF, bronchitis, breast cancer, heart failure, diabetes, hypertension. EXAM: DG ABDOMEN ACUTE W/ 1V CHEST COMPARISON:  05/13/2017 FINDINGS: Cardiac enlargement with pulmonary vascular congestion and bilateral perihilar infiltrates consistent with edema. Bilateral pleural effusions, greater on the right. There is progression since previous study. No pneumothorax. Calcified and tortuous aorta. Right PICC line remains unchanged in position. IMPRESSION: Cardiac enlargement with pulmonary vascular congestion and increasing perihilar edema. Increasing bilateral pleural effusions. Electronically Signed   By: Lucienne Capers M.D.   On: 05/15/2017 01:00    Cardiac Studies   Echocardiogram 05/06/2017: Study Conclusions  - Left ventricle: The cavity size was normal. Wall thickness was   increased in a pattern of moderate LVH. Systolic function was   normal. The estimated ejection fraction was in the range of 60%   to 65%. Wall motion was normal; there were no regional wall   motion abnormalities. - Aortic valve: Severely calcified annulus. Trileaflet; severely   thickened leaflets. There was moderate stenosis. Lower gradient   than expected for moderate AS likely due to low stroke  volume   index (paradoxical low flow low gradient AS). Morphologically and   by AVA VTI there is at least moderate AS. Mean gradient (S): 7 mm   Hg. VTI ratio of LVOT to aortic valve: 0.37. Valve area (VTI):   1.17 cm^2. Valve area (Vmax): 0.91 cm^2. - Mitral valve: Moderately calcified annulus. Mildly thickened   leaflets . There was moderate regurgitation. The MR is eccentric   and may be underestimated. The MR VC is 0.4 cm. - Left atrium: The atrium was massively dilated. - Right ventricle: The ventricular septum is flattened in systole   consistent with RV pressure overload. The cavity size was   moderately dilated. Systolic function was moderately reduced.   TAPSE: 10 mm . - Right atrium: The atrium was massively dilated. - Atrial septum: No defect or patent foramen ovale was identified. - Tricuspid valve: There was mild-moderate regurgitation. - Pulmonary arteries: Systolic pressure was severely increased. PA   peak pressure: 79 mm Hg (S). - Inferior vena cava: The vessel was dilated. The respirophasic   diameter changes were blunted (< 50%), consistent with elevated   central venous pressure. - Pericardium, extracardiac: There is a large left pleural   effusion.  Patient Profile     83 y.o. female with a history of persistent atrial fibrillation, CAD, hypertension, and severe pulmonary hypertension with cor pulmonale. She is now admitted with significant volume overload for IV diuresis.  Assessment & Plan    1. Acute on chronic diastolic heart failure with cor pulmonale in the setting of severe pulmonary hypertension. Patient still has evidence of significant volume overload but continues to diurese on high-dose IV Lasix with stable creatinine.  2. Persistent atrial fibrillation, presently with better heart rate control and now on Cardizem CD 180 mg daily along with low-dose Eliquis.  Would continue with present dose of IV Lasix, 80 mg every 8 hours as she has good urine  output and creatinine is stable. She remains clinically volume overloaded with indication to continue with diuretic plan. Otherwise no change to Cardizem CD or Eliquis.  Signed, Rozann Lesches, MD  05/15/2017, 12:03 PM

## 2017-05-15 NOTE — Progress Notes (Signed)
Physical Therapy Treatment Patient Details Name: [name] MRN: [id number] DOB: 14-Oct-1920 Today's Date: 05/15/2017    History of Present Illness [name] is a 83 y.o. female with medical history significant of diastolic heart failure, hypertension, diabetes, atrial fibrillation on Eliquis, hypothyroidism, and CAD presenting with concern for new heart failure after being seen in the cardiology office.  Patient is unaccompanied and was unable to provide a thorough history.  She does not report SOB to me and denies cough, chest pain.    PT Comments    Patient cooperative and followed directions consistently, demonstrated improvement in sitting balance keeping trunk in midlinefor up to 4 minutes while performing BLE ROM/strengnthening exercises, able to lean forward backs with feet supported and arms resting on lap without losing sitting balance.  Patient has most difficulty during supine to sitting to supine secondary becoming very anxious with increased extension of trunk.  Patient instructed to open/close left fist to help reduce edema/swelling in left hand/arm with good return demonstrated. Patient will benefit from continued physical therapy in hospital and recommended venue below to increase strength, balance, endurance for safe ADLs and gait.   Follow Up Recommendations  SNF;Supervision/Assistance - 24 hour     Equipment Recommendations  None recommended by PT    Recommendations for Other Services       Precautions / Restrictions Precautions Precautions: Fall Restrictions Weight Bearing Restrictions: No    Mobility  Bed Mobility Overal bed mobility: Needs Assistance Bed Mobility: Rolling;Supine to Sit;Sit to Supine Rolling: Max assist   Supine to sit: Max assist Sit to supine: Max assist   General bed mobility comments: Patient becomes anxious during supine to sit resulting in guarding/falling backwards   Transfers                    Ambulation/Gait                  Stairs            Wheelchair Mobility    Modified Rankin (Stroke Patients Only)       Balance Overall balance assessment: Needs assistance Sitting-balance support: Feet supported;No upper extremity supported Sitting balance-Leahy Scale: Poor Sitting balance - Comments: able to keep trunk in midline for up to 3-4 minutes before falling backwards Postural control: Posterior lean                                  Cognition Arousal/Alertness: Awake/alert Behavior During Therapy: WFL for tasks assessed/performed Overall Cognitive Status: Within Functional Limits for tasks assessed                                        Exercises General Exercises - Lower Extremity Ankle Circles/Pumps: AROM;Both;15 reps;Supine;Strengthening Short Arc Quad: AROM;Both;15 reps;Supine;Strengthening Long Arc Quad: Seated;AROM;Strengthening;10 reps Heel Slides: AAROM;Both;15 reps;Supine;Strengthening Hip ABduction/ADduction: AAROM;Both;15 reps;Supine;Strengthening    General Comments        Pertinent Vitals/Pain Pain Assessment: Faces Pain Score: 6  Faces Pain Scale: Hurts even more Pain Location: BLE Pain Descriptors / Indicators: Pressure Pain Intervention(s): Limited activity within patient's tolerance;Monitored during session    Home Living                      Prior Function  PT Goals (current goals can now be found in the care plan section) Acute Rehab PT Goals Patient Stated Goal: return home Time For Goal Achievement: 05/27/17 Potential to Achieve Goals: Fair Progress towards PT goals: Progressing toward goals    Frequency    Min 3X/week      PT Plan Current plan remains appropriate    Co-evaluation              AM-PAC PT "6 Clicks" Daily Activity  Outcome Measure  Difficulty turning over in bed (including adjusting bedclothes, sheets and blankets)?: A Lot Difficulty moving from lying  on back to sitting on the side of the bed? : A Lot Difficulty sitting down on and standing up from a chair with arms (e.g., wheelchair, bedside commode, etc,.)?: Unable Help needed moving to and from a bed to chair (including a wheelchair)?: Total Help needed walking in hospital room?: Total Help needed climbing 3-5 steps with a railing? : Total 6 Click Score: 8    End of Session   Activity Tolerance: Patient tolerated treatment well;Patient limited by fatigue;Patient limited by pain Patient left: in bed;with call bell/phone within reach;with bed alarm set Nurse Communication: Mobility status PT Visit Diagnosis: Unsteadiness on feet (R26.81);Other abnormalities of gait and mobility (R26.89);Muscle weakness (generalized) (M62.81)     Time: 0076-2263 PT Time Calculation (min) (ACUTE ONLY): 31 min  Charges:  $Therapeutic Activity: 23-37 mins                    G Codes:       3:21 PM, 2017/06/09 Lonell Grandchild, MPT Physical Therapist with [facility] [place] [phone] office [phone] mobile phone

## 2017-05-15 NOTE — Progress Notes (Signed)
Occupational Therapy Treatment Patient Details Name: [name] MRN: [id number] DOB: [birth year] Today's Date: 05/15/2017    History of present illness [name] is a 83 y.o. female with medical history significant of diastolic heart failure, hypertension, diabetes, atrial fibrillation on Eliquis, hypothyroidism, and CAD presenting with concern for new heart failure after being seen in the cardiology office.  Patient is unaccompanied and was unable to provide a thorough history.  She does not report SOB to me and denies cough, chest pain.   OT comments  Pt received supine in bed, sleeping but easily awakened with calling name. Pt agreeable to participate in OT session, exercises and ADLs completed at bed level as pt unwilling to attempt sitting at EOB this am. Pt instructed in BUE exercises, verbal and visual cuing for completion, occasional rest breaks. Pt able to complete more breakfast preparation today compared to previous session. Discharge to SNF remains appropriate.    Follow Up Recommendations  SNF    Equipment Recommendations  None recommended by OT    Recommendations for Other Services      Precautions / Restrictions Precautions Precautions: Fall Restrictions Weight Bearing Restrictions: No       Mobility Bed Mobility Overal bed mobility: Needs Assistance Bed Mobility: Rolling Rolling: Max assist         General bed mobility comments: Pt requiring max assist for shifting/rolling in bed and moving BLE onto pillow to float heels  Transfers                 General transfer comment: not completed        ADL either performed or assessed with clinical judgement   ADL Overall ADL's : Needs assistance/impaired Eating/Feeding: Set up;Bed level Eating/Feeding Details (indicate cue type and reason): Pt required assistance for opening condiments and drinks; once open pt was able to spread jelly on bread with OT assisting to hold bread.  Grooming: Wash/dry  face;Brushing hair;Set up;Bed level Grooming Details (indicate cue type and reason): OT provided necessary tools and pt able to complete grooming tasks at bed level                                               Cognition Arousal/Alertness: Awake/alert Behavior During Therapy: WFL for tasks assessed/performed Overall Cognitive Status: History of cognitive impairments - at baseline                                          Exercises Exercises: General Upper Extremity;Other exercises General Exercises - Upper Extremity Shoulder Flexion: PROM;AROM;10 reps Shoulder Extension: PROM;AROM;10 reps Shoulder Horizontal ABduction: PROM;AROM;10 reps Shoulder Horizontal ADduction: PROM;AROM;10 reps Elbow Flexion: PROM;AROM;10 reps Elbow Extension: PROM;AROM;10 reps Digit Composite Flexion: AROM;10 reps Composite Extension: AROM;10 reps Other Exercises Other Exercises: PNF pattern reaching to facilitate weight-shifting while sitting up in bed. A/ROM with RUE, AA/ROM with LUE due to limited ROM and strength. 10X each Other Exercises: shoulder protraction, AA/ROM and A/ROM, 10X each           Pertinent Vitals/ Pain       Pain Assessment: No/denies pain         Frequency  Min 2X/week        Progress Toward Goals  OT Goals(current goals can now  be found in the care plan section)  Progress towards OT goals: Progressing toward goals  Acute Rehab OT Goals Patient Stated Goal: return home OT Goal Formulation: With patient Time For Goal Achievement: 05/20/17 Potential to Achieve Goals: Fair ADL Goals Pt Will Perform Grooming: with set-up;sitting Pt Will Transfer to Toilet: with max assist;stand pivot transfer;bedside commode Pt/caregiver will Perform Home Exercise Program: Increased strength;Both right and left upper extremity;With minimal assist;With written HEP provided  Plan Discharge plan remains appropriate          End of Session    OT  Visit Diagnosis: Muscle weakness (generalized) (M62.81)   Activity Tolerance Patient tolerated treatment well   Patient Left in bed;with call bell/phone within reach;with bed alarm set   Nurse Communication          Time: 0321-2248 OT Time Calculation (min): 32 min  Charges: OT General Charges $OT Visit: 1 Visit OT Treatments $Self Care/Home Management : 8-22 mins $Therapeutic Exercise: 8-22 mins    Guadelupe Sabin, OTR/L  [phone] 05/15/2017, 8:25 AM

## 2017-05-15 NOTE — Progress Notes (Signed)
PROGRESS NOTE  [name] XBD:[id number] DOB: 1920/12/07 DOA: 05/05/2017 PCP: Hilbert Corrigan, MD  Brief History: 83 y.o.femalehistory of afib, CAD with stent to LAD in 8341, chronic diastolic HF, HTN, hypothyroidism, hyperlipidemia, dementiaadmitted with lower extremity edema, weight gain. The family  reports gradual increase in swelling over the last several daysprior to admission. The patient was seen in the cardiology office on 05/05/2018. The patient was noted to have anasarca and worsening lower extremity edema. The patient was started on IV lasix. Cardiology was consulted to assist.  Assessment/Plan: Acute on chronic diastolic CHF -9.6Q for the admission -Patient had dry weight of 123-125 in July 2019 -IV furosemide increased to 80 mg IV every 8 hours 05/11/17 -05/06/2017 echo EF--60-65%, RV pressure overload with moderate reduction in RV function. PASP 79 -NEG 13 lbs for the admit -remains fluid overloaded, needing IV diuresis. Renal function appears to be tolerating current dose of Lasix. -bisoprolol stopped due to sinus pauses-->improved -d/c lisinopril and imdur due to soft BPs -using smaller cuff  -appreciate cardiology follow up -am BMP  Right Arm Erythema -has been transient and intermittent during hospitalization with blanchable and non-blanchable components -likely related to venous stasis from anasarca -will not start antibiotics as pt is afebrile and hemodynamically stable without leukocytosis   Permanent atrial fibrillation -Rate reasonablycontrolled -Continue diltiazem CD -CHADSVASc = 6 -continue apixaban  Sinus pauses/sinus bradycardia -resting HR in low 40s with up to 3 sec pauses -d/c bisoprolol-->improved -continue dilt -Heart rates remained variable, having episodes of tachycardia and bradycardia -Not felt to be good candidate for pacemaker  Dementia -at baseline, no acute behavioral  disturbances.  Hyperlipidemia -Continue Lipitor.  Hypertension -BPs soft now with diuresis -d/c bisoprolol due to pauses -d/c lisinopril and imdur due to soft BPs -Blood pressure now stable  DiabetesMellitus type 2 -Hold metformin. -03/08/17 A1C - 7.2 -Has had several instances of hypoglycemia, only receiving a sensitive sliding scale, continue to monitor. -hypoglycemia likely due to poor po intake -d/c ISS but continue to check CBGs  Severe protein calorie malnutrition -continue supplements  Hypokalemia -repleted -mag 1.9  Leg pain and edema -venous duplex legs--neg  R>LUE edema -duplex RUE--NEG DVT  Goals of Care -palliative medicine consulted   Disposition Plan: SNFin 2-3 days when cleared by cardiology Family Communication: No family at bedside   Consultants: cardiology, palliative medicine  Code Status: DNR  DVT Prophylaxis: apixaban   Procedures: As Listed in Progress Note Above  Antibiotics: None   Subjective: Complains of generalized weakness. Overall feels breathing is improving.   Objective: Vitals:   05/14/17 2013 05/14/17 2156 05/15/17 0558 05/15/17 1300  BP:  107/72 119/70 123/78  Pulse:  (!) 110 100 93  Resp:  16 16 18   Temp:  98.3 F (36.8 C) 98.6 F (37 C) 98.3 F (36.8 C)  TempSrc:  Oral Oral Oral  SpO2: (!) 88% 96% 90% 93%  Weight:   64.6 kg (142 lb 6.7 oz)   Height:        Intake/Output Summary (Last 24 hours) at 05/15/17 1422 Last data filed at 05/15/17 1300  Gross per 24 hour  Intake              960 ml  Output             1450 ml  Net             -490 ml   Weight change: -0.6 kg (-1  lb 5.2 oz) Exam:   General:  Pt is alert, follows commands appropriately, not in acute distress  HEENT: No icterus, No thrush, No neck mass, [place]/AT  Cardiovascular: IRRR, S1/S2, no rubs, no gallops  Respiratory: bibasilar crackle, no wheeze  Abdomen: Soft/+BS, non tender, non distended, no  guarding  Extremities: 1 +LE edema, No lymphangitis, No petechiae, No rashes, no synovitis   Data Reviewed: I have personally reviewed following labs and imaging studies Basic Metabolic Panel:  Recent Labs Lab 05/09/17 0626 05/10/17 0623 05/11/17 0605 05/12/17 0700 05/13/17 0615 05/14/17 1206  NA 138 139 139 139 139 137  K 3.3* 4.1 4.0 3.9 3.9 3.9  CL 95* 94* 94* 97* 95* 93*  CO2 38* 38* 38* 37* 40* 38*  GLUCOSE 92 98 80 79 99 161*  BUN 25* 34* 41* 40* 42* 40*  CREATININE 0.55 0.63 0.60 0.53 0.63 0.62  CALCIUM 7.9* 8.1* 8.1* 7.6* 7.9* 8.2*  MG 1.9 1.9 1.9  --   --   --    Liver Function Tests:  Recent Labs Lab 05/13/17 0615  AST 25  ALT 20  ALKPHOS 66  BILITOT 0.7  PROT 4.9*  ALBUMIN 2.1*   No results for input(s): LIPASE, AMYLASE in the last 168 hours. No results for input(s): AMMONIA in the last 168 hours. Coagulation Profile: No results for input(s): INR, PROTIME in the last 168 hours. CBC:  Recent Labs Lab 05/13/17 0615  WBC 4.4  HGB 10.8*  HCT 35.0*  MCV 88.8  PLT 207   Cardiac Enzymes: No results for input(s): CKTOTAL, CKMB, CKMBINDEX, TROPONINI in the last 168 hours. BNP: Invalid input(s): POCBNP CBG:  Recent Labs Lab 05/14/17 0820 05/14/17 1648 05/14/17 2101 05/15/17 0732 05/15/17 1131  GLUCAP 105* 113* 146* 105* 144*   HbA1C: No results for input(s): HGBA1C in the last 72 hours. Urine analysis:    Component Value Date/Time   COLORURINE STRAW (A) 05/05/2017 1651   APPEARANCEUR CLEAR 05/05/2017 1651   LABSPEC 1.006 05/05/2017 1651   PHURINE 6.0 05/05/2017 1651   GLUCOSEU NEGATIVE 05/05/2017 1651   HGBUR SMALL (A) 05/05/2017 1651   BILIRUBINUR NEGATIVE 05/05/2017 1651   KETONESUR NEGATIVE 05/05/2017 1651   PROTEINUR NEGATIVE 05/05/2017 1651   UROBILINOGEN 0.2 04/19/2014 1730   NITRITE NEGATIVE 05/05/2017 1651   LEUKOCYTESUR TRACE (A) 05/05/2017 1651   Sepsis Labs: @LABRCNTIP (procalcitonin:4,lacticidven:4) ) Recent Results  (from the past 240 hour(s))  Urine culture     Status: Abnormal   Collection Time: 05/05/17  4:51 PM  Result Value Ref Range Status   Specimen Description URINE, CLEAN CATCH  Final   Special Requests NONE  Final   Culture MULTIPLE SPECIES PRESENT, SUGGEST RECOLLECTION (A)  Final   Report Status 05/07/2017 FINAL  Final  MRSA PCR Screening     Status: None   Collection Time: 05/05/17  7:12 PM  Result Value Ref Range Status   MRSA by PCR NEGATIVE NEGATIVE Final    Comment:        The GeneXpert MRSA Assay (FDA approved for NASAL specimens only), is one component of a comprehensive MRSA colonization surveillance program. It is not intended to diagnose MRSA infection nor to guide or monitor treatment for MRSA infections.      Scheduled Meds: . apixaban  2.5 mg Oral BID  . atorvastatin  20 mg Oral q1800  . diltiazem  180 mg Oral Daily  . feeding supplement (PRO-STAT SUGAR FREE 64)  30 mL Oral TID WC  . furosemide  80 mg Intravenous Q8H  . gabapentin  200 mg Oral Q8H  . levothyroxine  25 mcg Oral QPM  . mirtazapine  7.5 mg Oral QHS  . polyethylene glycol  17 g Oral Daily  . potassium chloride  40 mEq Oral Daily  . sodium chloride flush  3 mL Intravenous Q12H   Continuous Infusions: . sodium chloride      Procedures/Studies: US Venous Img Lower Bilateral  Result Date: 05/11/2017 CLINICAL DATA:  Chronic bilateral lower extremity pain and edema. History of breast cancer. Evaluate for DVT. EXAM: BILATERAL LOWER EXTREMITY VENOUS DOPPLER ULTRASOUND TECHNIQUE: Gray-scale sonography with graded compression, as well as color Doppler and duplex ultrasound were performed to evaluate the lower extremity deep venous systems from the level of the common femoral vein and including the common femoral, femoral, profunda femoral, popliteal and calf veins including the posterior tibial, peroneal and gastrocnemius veins when visible. The superficial great saphenous vein was also interrogated.  Spectral Doppler was utilized to evaluate flow at rest and with distal augmentation maneuvers in the common femoral, femoral and popliteal veins. COMPARISON:  Left lower extremity venous Doppler ultrasound - 03/04/2014 FINDINGS: RIGHT LOWER EXTREMITY Common Femoral Vein: No evidence of thrombus. Normal compressibility, respiratory phasicity and response to augmentation. Saphenofemoral Junction: No evidence of thrombus. Normal compressibility and flow on color Doppler imaging. Profunda Femoral Vein: No evidence of thrombus. Normal compressibility and flow on color Doppler imaging. Femoral Vein: No evidence of thrombus. Normal compressibility, respiratory phasicity and response to augmentation. Popliteal Vein: No evidence of thrombus. Normal compressibility, respiratory phasicity and response to augmentation. Calf Veins: No evidence of thrombus. Normal compressibility and flow on color Doppler imaging. Superficial Great Saphenous Vein: No evidence of thrombus. Normal compressibility and flow on color Doppler imaging. Venous Reflux:  None. Other Findings: Subcutaneous edema is noted throughout the right lower extremity. LEFT LOWER EXTREMITY Common Femoral Vein: No evidence of thrombus. Normal compressibility, respiratory phasicity and response to augmentation. Saphenofemoral Junction: No evidence of thrombus. Normal compressibility and flow on color Doppler imaging. Profunda Femoral Vein: No evidence of thrombus. Normal compressibility and flow on color Doppler imaging. Femoral Vein: No evidence of thrombus. Normal compressibility, respiratory phasicity and response to augmentation. Popliteal Vein: No evidence of thrombus. Normal compressibility, respiratory phasicity and response to augmentation. Calf Veins: No evidence of thrombus. Normal compressibility and flow on color Doppler imaging. Superficial Great Saphenous Vein: No evidence of thrombus. Normal compressibility and flow on color Doppler imaging. Venous  Reflux:  None. Other Findings: Subcutaneous edema is noted throughout the left lower extremity. IMPRESSION: No evidence of DVT within either lower extremity. Electronically Signed   By: Sandi Mariscal M.D.   On: 05/11/2017 14:11   US Venous Img Upper Uni Right  Result Date: 05/12/2017 CLINICAL DATA:  Right arm swelling for several days EXAM: RIGHT UPPER EXTREMITY VENOUS DOPPLER ULTRASOUND TECHNIQUE: Gray-scale sonography with graded compression, as well as color Doppler and duplex ultrasound were performed to evaluate the upper extremity deep venous system from the level of the subclavian vein and including the jugular, axillary, basilic, radial, ulnar and upper cephalic vein. Spectral Doppler was utilized to evaluate flow at rest and with distal augmentation maneuvers. COMPARISON:  None. FINDINGS: Contralateral Subclavian Vein: Respiratory phasicity is normal and symmetric with the symptomatic side. No evidence of thrombus. Normal compressibility. Internal Jugular Vein: No evidence of thrombus. Normal compressibility, respiratory phasicity and response to augmentation. Subclavian Vein: No evidence of thrombus. Normal compressibility, respiratory phasicity and response to augmentation. Axillary  Vein: No evidence of thrombus. Normal compressibility, respiratory phasicity and response to augmentation. Cephalic Vein: No evidence of thrombus. Normal compressibility, respiratory phasicity and response to augmentation. Basilic Vein: No evidence of thrombus. Normal compressibility, respiratory phasicity and response to augmentation. Brachial Veins: No evidence of thrombus. Normal compressibility, respiratory phasicity and response to augmentation. Radial Veins: No evidence of thrombus. Normal compressibility, respiratory phasicity and response to augmentation. Ulnar Veins: No evidence of thrombus. Normal compressibility, respiratory phasicity and response to augmentation. Venous Reflux:  None visualized. Other Findings:  PICC line is noted without evidence of deep venous thrombosis. IMPRESSION: No evidence of DVT within the right upper extremity. Electronically Signed   By: Inez Catalina M.D.   On: 05/12/2017 15:47   Dg Chest Port 1 View  Result Date: 05/13/2017 CLINICAL DATA:  Central catheter placement with bleeding around catheter site EXAM: PORTABLE CHEST 1 VIEW COMPARISON:  May 05, 2017 FINDINGS: Port-A-Cath tip is in the right atrium, approximately 6 cm distal to the cavoatrial junction. No pneumothorax. There is cardiomegaly with pulmonary venous hypertension. There are bilateral pleural effusions with generalized interstitial and patchy alveolar edema. There is aortic atherosclerosis. No adenopathy. There is degenerative change in each shoulder with superior migration of the right humeral head. IMPRESSION: Central catheter tip in right atrium. No pneumothorax. Underlying congestive heart failure. Edema and effusions appear stable compared to 1 day prior. There is aortic atherosclerosis. Chronic rotator cuff tear noted on the right with superior migration of the right humeral head. Aortic Atherosclerosis (ICD10-I70.0). Electronically Signed   By: Lowella Grip III M.D.   On: 05/13/2017 07:50   Dg Chest Port 1 View  Result Date: 05/05/2017 CLINICAL DATA:  Patient with swelling. EXAM: PORTABLE CHEST 1 VIEW COMPARISON:  Chest radiograph 03/07/2017. FINDINGS: Monitoring leads overlie the patient. Low lung volumes. Stable cardiomegaly with aortic atherosclerosis. Diffuse bilateral heterogeneous pulmonary opacities. Moderate layering bilateral pleural effusions. No pneumothorax. IMPRESSION: Findings most compatible with pulmonary edema and moderate layering bilateral pleural effusions. Cardiomegaly. Electronically Signed   By: Lovey Newcomer M.D.   On: 05/05/2017 15:03   Dg Abd Acute W/chest  Result Date: 05/15/2017 CLINICAL DATA:  Abdominal pain. History of CHF, bronchitis, breast cancer, heart failure, diabetes,  hypertension. EXAM: DG ABDOMEN ACUTE W/ 1V CHEST COMPARISON:  05/13/2017 FINDINGS: Cardiac enlargement with pulmonary vascular congestion and bilateral perihilar infiltrates consistent with edema. Bilateral pleural effusions, greater on the right. There is progression since previous study. No pneumothorax. Calcified and tortuous aorta. Right PICC line remains unchanged in position. IMPRESSION: Cardiac enlargement with pulmonary vascular congestion and increasing perihilar edema. Increasing bilateral pleural effusions. Electronically Signed   By: Lucienne Capers M.D.   On: 05/15/2017 01:00    [facility] Pager [phone]  If 7PM-7AM, please contact night-coverage [email or web address] Password [email or web address] 05/15/2017, 2:22 PM   LOS: 10 days

## 2017-05-15 NOTE — Clinical Social Work Note (Signed)
LCSW provided status update to [employer] at [place].     Lawton Dollinger, [name], LCSW

## 2017-05-16 LAB — BASIC METABOLIC PANEL
ANION GAP: 8 (ref 5–15)
BUN: 31 mg/dL — AB (ref 6–20)
CALCIUM: 8.2 mg/dL — AB (ref 8.9–10.3)
CO2: 37 mmol/L — AB (ref 22–32)
CREATININE: 0.65 mg/dL (ref 0.44–1.00)
Chloride: 91 mmol/L — ABNORMAL LOW (ref 101–111)
GFR calc Af Amer: >60 mL/min (ref >60)
GLUCOSE: 233 mg/dL — AB (ref 65–99)
Potassium: 3.8 mmol/L (ref 3.5–5.1)
Sodium: 136 mmol/L (ref 135–145)

## 2017-05-16 LAB — GLUCOSE, CAPILLARY
GLUCOSE-CAPILLARY: 85 mg/dL (ref 65–99)
GLUCOSE-CAPILLARY: 133 mg/dL — AB (ref 65–99)
Glucose-Capillary: 181 mg/dL — ABNORMAL HIGH (ref 65–99)
Glucose-Capillary: 119 mg/dL — ABNORMAL HIGH (ref 65–99)

## 2017-05-16 NOTE — Progress Notes (Signed)
PROGRESS NOTE  [name]:[id number] DOB: [birth year] DOA: 05/05/2017 PCP: Hilbert Corrigan, MD  Brief History: 83 y.o.femalehistory of afib, CAD with stent to LAD in 9562, chronic diastolic HF, HTN, hypothyroidism, hyperlipidemia, dementiaadmitted with lower extremity edema, weight gain. The family  reports gradual increase in swelling over the last several daysprior to admission. The patient was seen in the cardiology office on 05/05/2018. The patient was noted to have anasarca and worsening lower extremity edema. The patient was started on IV lasix. Cardiology was consulted to assist.  Assessment/Plan: Acute on chronic diastolic CHF -1.3Y for the admission -Patient had dry weight of 123-125 in July 2019 -IV furosemide increased to 80 mg IV every 8 hours 05/11/17 -05/06/2017 echo EF--60-65%, RV pressure overload with moderate reduction in RV function. PASP 79 -NEG 20 lbs for the admit, (down 7 pounds since yesterday indicating that this may be inaccurate) -remains fluid overloaded, needing IV diuresis. Renal function appears to be tolerating current dose of Lasix. -bisoprolol stopped due to sinus pauses-->improved -d/c lisinopril and imdur due to soft BPs -using smaller cuff  -appreciate cardiology follow up -am BMP  Right Arm Erythema -has been transient and intermittent during hospitalization with blanchable and non-blanchable components -likely related to venous stasis from anasarca -will not start antibiotics as pt is afebrile and hemodynamically stable without leukocytosis   Permanent atrial fibrillation -Rate reasonablycontrolled -Continue diltiazem CD -CHADSVASc = 6 -continue apixaban  Sinus pauses/sinus bradycardia -resting HR in low 40s with up to 3 sec pauses -d/c bisoprolol-->improved -continue dilt -Heart rates remained variable, having episodes of tachycardia and bradycardia -Not felt to be good candidate for  pacemaker  Dementia -at baseline, no acute behavioral disturbances.  Hyperlipidemia -Continue Lipitor.  Hypertension -BPs soft now with diuresis -d/c bisoprolol due to pauses -d/c lisinopril and imdur due to soft BPs -Blood pressure now stable  DiabetesMellitus type 2 -Hold metformin. -03/08/17 A1C - 7.2 -Has had several instances of hypoglycemia, only receiving a sensitive sliding scale, continue to monitor. -hypoglycemia likely due to poor po intake -d/c ISS but continue to check CBGs  Severe protein calorie malnutrition -continue supplements  Hypokalemia -repleted -mag 1.9  Leg pain and edema -venous duplex legs--neg  R>LUE edema -duplex RUE--NEG DVT  Goals of Care -palliative medicine consulted   Disposition Plan: SNFin 2-3 days when cleared by cardiology Family Communication: No family at bedside   Consultants: cardiology, palliative medicine  Code Status: DNR  DVT Prophylaxis: apixaban   Procedures: As Listed in Progress Note Above  Antibiotics: None   Subjective: Patient is somnolent this morning. Denies any shortness of breath.   Objective: Vitals:   05/16/17 0021 05/16/17 0500 05/16/17 0622 05/16/17 1347  BP: 124/75  137/79 104/73  Pulse: 94  81 76  Resp: 15   18  Temp: 98 F (36.7 C)  (!) 97.4 F (36.3 C) 97.6 F (36.4 C)  TempSrc:   Axillary Axillary  SpO2: 93%  94% 100%  Weight:  61.5 kg (135 lb 9.3 oz)    Height:        Intake/Output Summary (Last 24 hours) at 05/16/17 1535 Last data filed at 05/16/17 1348  Gross per 24 hour  Intake             1500 ml  Output             1300 ml  Net  200 ml   Weight change: -3.1 kg (-6 lb 13.3 oz) Exam:   General:  Pt is alert, follows commands appropriately, not in acute distress  HEENT: No icterus, No thrush, No neck mass, [place]/AT  Cardiovascular: IRRR, S1/S2, no rubs, no gallops  Respiratory: bibasilar crackles, no wheeze  Abdomen:  Soft/+BS, non tender, non distended, no guarding  Extremities: 1 +LE edema, No lymphangitis, No petechiae, No rashes, no synovitis   Data Reviewed: I have personally reviewed following labs and imaging studies Basic Metabolic Panel:  Recent Labs Lab 05/10/17 0623 05/11/17 0605 05/12/17 0700 05/13/17 0615 05/14/17 1206 05/16/17 0950  NA 139 139 139 139 137 136  K 4.1 4.0 3.9 3.9 3.9 3.8  CL 94* 94* 97* 95* 93* 91*  CO2 38* 38* 37* 40* 38* 37*  GLUCOSE 98 80 79 99 161* 233*  BUN 34* 41* 40* 42* 40* 31*  CREATININE 0.63 0.60 0.53 0.63 0.62 0.65  CALCIUM 8.1* 8.1* 7.6* 7.9* 8.2* 8.2*  MG 1.9 1.9  --   --   --   --    Liver Function Tests:  Recent Labs Lab 05/13/17 0615  AST 25  ALT 20  ALKPHOS 66  BILITOT 0.7  PROT 4.9*  ALBUMIN 2.1*   No results for input(s): LIPASE, AMYLASE in the last 168 hours. No results for input(s): AMMONIA in the last 168 hours. Coagulation Profile: No results for input(s): INR, PROTIME in the last 168 hours. CBC:  Recent Labs Lab 05/13/17 0615  WBC 4.4  HGB 10.8*  HCT 35.0*  MCV 88.8  PLT 207   Cardiac Enzymes: No results for input(s): CKTOTAL, CKMB, CKMBINDEX, TROPONINI in the last 168 hours. BNP: Invalid input(s): POCBNP CBG:  Recent Labs Lab 05/15/17 1131 05/15/17 1644 05/15/17 2038 05/16/17 0743 05/16/17 1111  GLUCAP 144* 147* 120* 85 181*   HbA1C: No results for input(s): HGBA1C in the last 72 hours. Urine analysis:    Component Value Date/Time   COLORURINE STRAW (A) 05/05/2017 1651   APPEARANCEUR CLEAR 05/05/2017 1651   LABSPEC 1.006 05/05/2017 1651   PHURINE 6.0 05/05/2017 1651   GLUCOSEU NEGATIVE 05/05/2017 1651   HGBUR SMALL (A) 05/05/2017 1651   BILIRUBINUR NEGATIVE 05/05/2017 1651   KETONESUR NEGATIVE 05/05/2017 1651   PROTEINUR NEGATIVE 05/05/2017 1651   UROBILINOGEN 0.2 04/19/2014 1730   NITRITE NEGATIVE 05/05/2017 1651   LEUKOCYTESUR TRACE (A) 05/05/2017 1651   Sepsis  Labs: @LABRCNTIP (procalcitonin:4,lacticidven:4) ) No results found for this or any previous visit (from the past 240 hour(s)).   Scheduled Meds: . apixaban  2.5 mg Oral BID  . atorvastatin  20 mg Oral q1800  . diltiazem  180 mg Oral Daily  . feeding supplement (PRO-STAT SUGAR FREE 64)  30 mL Oral TID WC  . furosemide  80 mg Intravenous Q8H  . gabapentin  200 mg Oral Q8H  . levothyroxine  25 mcg Oral QPM  . mirtazapine  7.5 mg Oral QHS  . polyethylene glycol  17 g Oral Daily  . potassium chloride  40 mEq Oral Daily  . sodium chloride flush  3 mL Intravenous Q12H   Continuous Infusions: . sodium chloride      Procedures/Studies: US Venous Img Lower Bilateral  Result Date: 05/11/2017 CLINICAL DATA:  Chronic bilateral lower extremity pain and edema. History of breast cancer. Evaluate for DVT. EXAM: BILATERAL LOWER EXTREMITY VENOUS DOPPLER ULTRASOUND TECHNIQUE: Gray-scale sonography with graded compression, as well as color Doppler and duplex ultrasound were performed to evaluate the lower  extremity deep venous systems from the level of the common femoral vein and including the common femoral, femoral, profunda femoral, popliteal and calf veins including the posterior tibial, peroneal and gastrocnemius veins when visible. The superficial great saphenous vein was also interrogated. Spectral Doppler was utilized to evaluate flow at rest and with distal augmentation maneuvers in the common femoral, femoral and popliteal veins. COMPARISON:  Left lower extremity venous Doppler ultrasound - 03/04/2014 FINDINGS: RIGHT LOWER EXTREMITY Common Femoral Vein: No evidence of thrombus. Normal compressibility, respiratory phasicity and response to augmentation. Saphenofemoral Junction: No evidence of thrombus. Normal compressibility and flow on color Doppler imaging. Profunda Femoral Vein: No evidence of thrombus. Normal compressibility and flow on color Doppler imaging. Femoral Vein: No evidence of thrombus.  Normal compressibility, respiratory phasicity and response to augmentation. Popliteal Vein: No evidence of thrombus. Normal compressibility, respiratory phasicity and response to augmentation. Calf Veins: No evidence of thrombus. Normal compressibility and flow on color Doppler imaging. Superficial Great Saphenous Vein: No evidence of thrombus. Normal compressibility and flow on color Doppler imaging. Venous Reflux:  None. Other Findings: Subcutaneous edema is noted throughout the right lower extremity. LEFT LOWER EXTREMITY Common Femoral Vein: No evidence of thrombus. Normal compressibility, respiratory phasicity and response to augmentation. Saphenofemoral Junction: No evidence of thrombus. Normal compressibility and flow on color Doppler imaging. Profunda Femoral Vein: No evidence of thrombus. Normal compressibility and flow on color Doppler imaging. Femoral Vein: No evidence of thrombus. Normal compressibility, respiratory phasicity and response to augmentation. Popliteal Vein: No evidence of thrombus. Normal compressibility, respiratory phasicity and response to augmentation. Calf Veins: No evidence of thrombus. Normal compressibility and flow on color Doppler imaging. Superficial Great Saphenous Vein: No evidence of thrombus. Normal compressibility and flow on color Doppler imaging. Venous Reflux:  None. Other Findings: Subcutaneous edema is noted throughout the left lower extremity. IMPRESSION: No evidence of DVT within either lower extremity. Electronically Signed   By: Sandi Mariscal M.D.   On: 05/11/2017 14:11   US Venous Img Upper Uni Right  Result Date: 05/12/2017 CLINICAL DATA:  Right arm swelling for several days EXAM: RIGHT UPPER EXTREMITY VENOUS DOPPLER ULTRASOUND TECHNIQUE: Gray-scale sonography with graded compression, as well as color Doppler and duplex ultrasound were performed to evaluate the upper extremity deep venous system from the level of the subclavian vein and including the jugular,  axillary, basilic, radial, ulnar and upper cephalic vein. Spectral Doppler was utilized to evaluate flow at rest and with distal augmentation maneuvers. COMPARISON:  None. FINDINGS: Contralateral Subclavian Vein: Respiratory phasicity is normal and symmetric with the symptomatic side. No evidence of thrombus. Normal compressibility. Internal Jugular Vein: No evidence of thrombus. Normal compressibility, respiratory phasicity and response to augmentation. Subclavian Vein: No evidence of thrombus. Normal compressibility, respiratory phasicity and response to augmentation. Axillary Vein: No evidence of thrombus. Normal compressibility, respiratory phasicity and response to augmentation. Cephalic Vein: No evidence of thrombus. Normal compressibility, respiratory phasicity and response to augmentation. Basilic Vein: No evidence of thrombus. Normal compressibility, respiratory phasicity and response to augmentation. Brachial Veins: No evidence of thrombus. Normal compressibility, respiratory phasicity and response to augmentation. Radial Veins: No evidence of thrombus. Normal compressibility, respiratory phasicity and response to augmentation. Ulnar Veins: No evidence of thrombus. Normal compressibility, respiratory phasicity and response to augmentation. Venous Reflux:  None visualized. Other Findings: PICC line is noted without evidence of deep venous thrombosis. IMPRESSION: No evidence of DVT within the right upper extremity. Electronically Signed   By: Inez Catalina M.D.   On:  05/12/2017 15:47   Dg Chest Port 1 View  Result Date: 05/13/2017 CLINICAL DATA:  Central catheter placement with bleeding around catheter site EXAM: PORTABLE CHEST 1 VIEW COMPARISON:  May 05, 2017 FINDINGS: Port-A-Cath tip is in the right atrium, approximately 6 cm distal to the cavoatrial junction. No pneumothorax. There is cardiomegaly with pulmonary venous hypertension. There are bilateral pleural effusions with generalized  interstitial and patchy alveolar edema. There is aortic atherosclerosis. No adenopathy. There is degenerative change in each shoulder with superior migration of the right humeral head. IMPRESSION: Central catheter tip in right atrium. No pneumothorax. Underlying congestive heart failure. Edema and effusions appear stable compared to 1 day prior. There is aortic atherosclerosis. Chronic rotator cuff tear noted on the right with superior migration of the right humeral head. Aortic Atherosclerosis (ICD10-I70.0). Electronically Signed   By: Lowella Grip III M.D.   On: 05/13/2017 07:50   Dg Chest Port 1 View  Result Date: 05/05/2017 CLINICAL DATA:  Patient with swelling. EXAM: PORTABLE CHEST 1 VIEW COMPARISON:  Chest radiograph 03/07/2017. FINDINGS: Monitoring leads overlie the patient. Low lung volumes. Stable cardiomegaly with aortic atherosclerosis. Diffuse bilateral heterogeneous pulmonary opacities. Moderate layering bilateral pleural effusions. No pneumothorax. IMPRESSION: Findings most compatible with pulmonary edema and moderate layering bilateral pleural effusions. Cardiomegaly. Electronically Signed   By: Lovey Newcomer M.D.   On: 05/05/2017 15:03   Dg Abd Acute W/chest  Result Date: 05/15/2017 CLINICAL DATA:  Abdominal pain. History of CHF, bronchitis, breast cancer, heart failure, diabetes, hypertension. EXAM: DG ABDOMEN ACUTE W/ 1V CHEST COMPARISON:  05/13/2017 FINDINGS: Cardiac enlargement with pulmonary vascular congestion and bilateral perihilar infiltrates consistent with edema. Bilateral pleural effusions, greater on the right. There is progression since previous study. No pneumothorax. Calcified and tortuous aorta. Right PICC line remains unchanged in position. IMPRESSION: Cardiac enlargement with pulmonary vascular congestion and increasing perihilar edema. Increasing bilateral pleural effusions. Electronically Signed   By: Lucienne Capers M.D.   On: 05/15/2017 01:00     [facility] Pager [phone]  If 7PM-7AM, please contact night-coverage [email or web address] Password [email or web address] 05/16/2017, 3:35 PM   LOS: 11 days

## 2017-05-16 NOTE — Progress Notes (Signed)
439mL urine in canister, but incontinent episode with a large amt of urine as well. Documented as a x1 occurence

## 2017-05-16 NOTE — Plan of Care (Signed)
Problem: Skin Integrity: Goal: Risk for impaired skin integrity will decrease Outcome: Not Progressing Pt has stage 2 on sacrum with foam dressing replaced. Pt continues to be turned q2h to prevent further breakdown. Pt educated on importance of being turned q2h and verbalized understanding, reinforcement needed. Will continue to monitor pt

## 2017-05-16 NOTE — Care Management Important Message (Signed)
Important Message  Patient Details  Name: [name] MRN: [id number] Date of Birth: 1920/08/16   Medicare Important Message Given:  Yes    Sherald Barge, RN 05/16/2017, 1:35 PM

## 2017-05-16 NOTE — Progress Notes (Signed)
Progress Note  Patient Name: [name] Date of Encounter: 05/16/2017  Primary Cardiologist: Dr. Dorris Carnes  Subjective   Sleepy. Says feet hurt but legs are less swollen.  Inpatient Medications    Scheduled Meds: . apixaban  2.5 mg Oral BID  . atorvastatin  20 mg Oral q1800  . diltiazem  180 mg Oral Daily  . feeding supplement (PRO-STAT SUGAR FREE 64)  30 mL Oral TID WC  . furosemide  80 mg Intravenous Q8H  . gabapentin  200 mg Oral Q8H  . levothyroxine  25 mcg Oral QPM  . mirtazapine  7.5 mg Oral QHS  . polyethylene glycol  17 g Oral Daily  . potassium chloride  40 mEq Oral Daily  . sodium chloride flush  3 mL Intravenous Q12H   Continuous Infusions: . sodium chloride     PRN Meds: sodium chloride, acetaminophen, ondansetron (ZOFRAN) IV, sodium chloride flush   Vital Signs    Vitals:   05/15/17 2011 05/16/17 0021 05/16/17 0500 05/16/17 0622  BP:  124/75  137/79  Pulse:  94  81  Resp:  15    Temp:  98 F (36.7 C)  (!) 97.4 F (36.3 C)  TempSrc:    Axillary  SpO2: 96% 93%  94%  Weight:   135 lb 9.3 oz (61.5 kg)   Height:        Intake/Output Summary (Last 24 hours) at 05/16/17 0942 Last data filed at 05/16/17 0643  Gross per 24 hour  Intake             1620 ml  Output             1800 ml  Net             -180 ml   Filed Weights   05/14/17 0449 05/15/17 0558 05/16/17 0500  Weight: 143 lb 11.8 oz (65.2 kg) 142 lb 6.7 oz (64.6 kg) 135 lb 9.3 oz (61.5 kg)    Telemetry    n/a  ECG    n/a  Physical Exam   GEN: No acute distress. Chronically ill appearing elderly female. Neck: No JVD Cardiac: Irregular rhythm, 3/6 systolic murmur heard at apex and along left sternal border, rubs, or gallops.  Respiratory: Clear to auscultation bilaterally. GI: Soft, nontender, non-distended  MS: Right arm edema, bilateral leg edema (improved since 10/3), dressings on heels bilaterally. Neuro:  Nonfocal  Psych: Normal affect   Labs    Chemistry Recent  Labs Lab 05/12/17 0700 05/13/17 0615 05/14/17 1206  NA 139 139 137  K 3.9 3.9 3.9  CL 97* 95* 93*  CO2 37* 40* 38*  GLUCOSE 79 99 161*  BUN 40* 42* 40*  CREATININE 0.53 0.63 0.62  CALCIUM 7.6* 7.9* 8.2*  PROT  --  4.9*  --   ALBUMIN  --  2.1*  --   AST  --  25  --   ALT  --  20  --   ALKPHOS  --  66  --   BILITOT  --  0.7  --   GFRNONAA >60 >60 >60  GFRAA >60 >60 >60  ANIONGAP 5 4* 6     Hematology Recent Labs Lab 05/13/17 0615  WBC 4.4  RBC 3.94  HGB 10.8*  HCT 35.0*  MCV 88.8  MCH 27.4  MCHC 30.9  RDW 20.2*  PLT 207    Cardiac EnzymesNo results for input(s): TROPONINI in the last 168 hours. No results for input(s):  TROPIPOC in the last 168 hours.   BNPNo results for input(s): BNP, PROBNP in the last 168 hours.   DDimer No results for input(s): DDIMER in the last 168 hours.   Radiology    Dg Abd Acute W/chest  Result Date: 05/15/2017 CLINICAL DATA:  Abdominal pain. History of CHF, bronchitis, breast cancer, heart failure, diabetes, hypertension. EXAM: DG ABDOMEN ACUTE W/ 1V CHEST COMPARISON:  05/13/2017 FINDINGS: Cardiac enlargement with pulmonary vascular congestion and bilateral perihilar infiltrates consistent with edema. Bilateral pleural effusions, greater on the right. There is progression since previous study. No pneumothorax. Calcified and tortuous aorta. Right PICC line remains unchanged in position. IMPRESSION: Cardiac enlargement with pulmonary vascular congestion and increasing perihilar edema. Increasing bilateral pleural effusions. Electronically Signed   By: Lucienne Capers M.D.   On: 05/15/2017 01:00    Cardiac Studies   Echocardiogram 05/06/2017: Study Conclusions  - Left ventricle: The cavity size was normal. Wall thickness was increased in a pattern of moderate LVH. Systolic function was normal. The estimated ejection fraction was in the range of 60% to 65%. Wall motion was normal; there were no regional wall motion  abnormalities. - Aortic valve: Severely calcified annulus. Trileaflet; severely thickened leaflets. There was moderate stenosis. Lower gradient than expected for moderate AS likely due to low stroke volume index (paradoxical low flow low gradient AS). Morphologically and by AVA VTI there is at least moderate AS. Mean gradient (S): 7 mm Hg. VTI ratio of LVOT to aortic valve: 0.37. Valve area (VTI): 1.17 cm^2. Valve area (Vmax): 0.91 cm^2. - Mitral valve: Moderately calcified annulus. Mildly thickened leaflets . There was moderate regurgitation. The MR is eccentric and may be underestimated. The MR VC is 0.4 cm. - Left atrium: The atrium was massively dilated. - Right ventricle: The ventricular septum is flattened in systole consistent with RV pressure overload. The cavity size was moderately dilated. Systolic function was moderately reduced. TAPSE: 10 mm . - Right atrium: The atrium was massively dilated. - Atrial septum: No defect or patent foramen ovale was identified. - Tricuspid valve: There was mild-moderate regurgitation. - Pulmonary arteries: Systolic pressure was severely increased. PA peak pressure: 79 mm Hg (S). - Inferior vena cava: The vessel was dilated. The respirophasic diameter changes were blunted (<50%), consistent with elevated central venous pressure. - Pericardium, extracardiac: There is a large left pleural effusion.  Patient Profile     83 y.o. female with a history of persistent atrial fibrillation, CAD, hypertension, and severe pulmonary hypertension with cor pulmonale. She is now admitted with significant volume overload for IV diuresis.  Assessment & Plan    1. Acute on chronic diastolic heart failure with cor pulmonale in the setting of severe pulmonary hypertension. Patient still has evidence of volume overload but continues to diurese (although amount has dissipated if I/O's correct) on high-dose IV Lasix.  Nursing notes  document 400 cc out in canister plus an additional large amount of urinary incontinence. BMET pending today. Adjust diuretics based on renal function.  2. Persistent atrial fibrillation, presently with better heart rate control and now on Cardizem CD 180 mg daily along with low-dose Eliquis. Has tachy-brady syndrome. Due to HR in 40's on 9/28 with pauses, bisoprolol was d/c. She is a poor pacemaker candidate.   3. Hypotension: BP's are normal now.   For questions or updates, please contact [place] Please consult [email or web address] for contact info under Cardiology/STEMI.      Signed, Kate Sable, MD  05/16/2017, 9:42 AM

## 2017-05-17 LAB — COMPREHENSIVE METABOLIC PANEL
ALBUMIN: 2.3 g/dL — AB (ref 3.5–5.0)
ALK PHOS: 78 U/L (ref 38–126)
ALT: 30 U/L (ref 14–54)
AST: 45 U/L — ABNORMAL HIGH (ref 15–41)
Anion gap: 8 (ref 5–15)
BUN: 33 mg/dL — ABNORMAL HIGH (ref 6–20)
CALCIUM: 8.4 mg/dL — AB (ref 8.9–10.3)
CHLORIDE: 93 mmol/L — AB (ref 101–111)
CO2: 39 mmol/L — AB (ref 22–32)
Creatinine, Ser: 0.59 mg/dL (ref 0.44–1.00)
GFR calc non Af Amer: >60 mL/min (ref >60)
GLUCOSE: 166 mg/dL — AB (ref 65–99)
POTASSIUM: 4.1 mmol/L (ref 3.5–5.1)
SODIUM: 140 mmol/L (ref 135–145)
TOTAL PROTEIN: 5.3 g/dL — AB (ref 6.5–8.1)
Total Bilirubin: 0.7 mg/dL (ref 0.3–1.2)

## 2017-05-17 LAB — GLUCOSE, CAPILLARY
GLUCOSE-CAPILLARY: 75 mg/dL (ref 65–99)
GLUCOSE-CAPILLARY: 90 mg/dL (ref 65–99)
GLUCOSE-CAPILLARY: 93 mg/dL (ref 65–99)
Glucose-Capillary: 199 mg/dL — ABNORMAL HIGH (ref 65–99)

## 2017-05-17 MED ORDER — ALTEPLASE 2 MG IJ SOLR
2.0000 mg | Freq: Once | INTRAMUSCULAR | Status: DC
  Filled 2017-05-17: qty 2

## 2017-05-17 MED ORDER — HYDROCODONE-ACETAMINOPHEN 5-325 MG PO TABS
1.0000 | ORAL_TABLET | ORAL | Status: DC | PRN
  Administered 2017-05-17 – 2017-05-18 (×3): 2 via ORAL
  Administered 2017-05-18: 1 via ORAL
  Filled 2017-05-17: qty 2
  Filled 2017-05-17: qty 1
  Filled 2017-05-17 (×2): qty 2

## 2017-05-17 NOTE — Progress Notes (Signed)
PROGRESS NOTE  RIO TABER VQX:[id number] DOB: 03/15/1921 DOA: 05/05/2017 PCP: Hilbert Corrigan, MD  Brief History: 83 y.o.femalehistory of afib, CAD with stent to LAD in 0034, chronic diastolic HF, HTN, hypothyroidism, hyperlipidemia, dementiaadmitted with lower extremity edema, weight gain. The family  reports gradual increase in swelling over the last several daysprior to admission. The patient was seen in the cardiology office on 05/05/2018. The patient was noted to have anasarca and worsening lower extremity edema. The patient was started on IV lasix. Cardiology was consulted to assist.  Assessment/Plan: Acute on chronic diastolic CHF -9.1P for the admission -Patient had dry weight of 123-125 in July 2019 -IV furosemide increased to 80 mg IV every 8 hours 05/11/17 -05/06/2017 echo EF--60-65%, RV pressure overload with moderate reduction in RV function. PASP 79 -NEG 20 lbs for the admit, (down 7 pounds since yesterday indicating that this may be inaccurate) -Appears to be approximately 10 pounds above dry weight. -remains fluid overloaded, needing IV diuresis. Renal function appears to be tolerating current dose of Lasix. -bisoprolol stopped due to sinus pauses-->improved -d/c lisinopril and imdur due to soft BPs -using smaller cuff  -appreciate cardiology follow up -am BMP  Right Arm Erythema -has been transient and intermittent during hospitalization with blanchable and non-blanchable components -likely related to venous stasis from anasarca -will not start antibiotics as pt is afebrile and hemodynamically stable without leukocytosis   Permanent atrial fibrillation -Rate reasonablycontrolled -Continue diltiazem CD -CHADSVASc = 6 -continue apixaban  Sinus pauses/sinus bradycardia -resting HR in low 40s with up to 3 sec pauses -d/c bisoprolol-->improved -continue dilt -Heart rates remained variable, having episodes of tachycardia and  bradycardia -Not felt to be good candidate for pacemaker  Dementia -at baseline, no acute behavioral disturbances.  Hyperlipidemia -Continue Lipitor.  Hypertension -BPs soft now with diuresis -d/c bisoprolol due to pauses -d/c lisinopril and imdur due to soft BPs -Blood pressure now stable  DiabetesMellitus type 2 -Hold metformin. -03/08/17 A1C - 7.2 -Has had several instances of hypoglycemia, only receiving a sensitive sliding scale, continue to monitor. -hypoglycemia likely due to poor po intake -d/c ISS but continue to check CBGs  Severe protein calorie malnutrition -continue supplements  Hypokalemia -repleted -mag 1.9  Leg pain and edema -venous duplex legs--neg  R>LUE edema -duplex RUE--NEG DVT  Goals of Care -palliative medicine consulted   Disposition Plan: SNFin 2-3 days when cleared by cardiology Family Communication: No family at bedside   Consultants: cardiology, palliative medicine  Code Status: DNR  DVT Prophylaxis: apixaban   Procedures: As Listed in Progress Note Above  Antibiotics: None   Subjective: Denies any chest pain. Feels breathing is improving.   Objective: Vitals:   05/16/17 1900 05/17/17 0106 05/17/17 0655 05/17/17 1045  BP: 91/73  117/69   Pulse: 72  93   Resp: 18  18   Temp: 98.3 F (36.8 C)  (!) 97.4 F (36.3 C)   TempSrc: Oral  Axillary   SpO2: 99%  98% 98%  Weight:  62 kg (136 lb 11 oz)    Height:        Intake/Output Summary (Last 24 hours) at 05/17/17 1419 Last data filed at 05/17/17 0138  Gross per 24 hour  Intake                0 ml  Output              900 ml  Net             -  900 ml   Weight change: 0.5 kg (1 lb 1.6 oz) Exam:   General:  Pt is alert, follows commands appropriately, not in acute distress  HEENT: No icterus, No thrush, No neck mass, [place]/AT  Cardiovascular: IRRR, S1/S2, no rubs, no gallops  Respiratory: bibasilar crackles, no wheeze  Abdomen:  Soft/+BS, non tender, non distended, no guarding  Extremities: 1 +LE edema, No lymphangitis, No petechiae, No rashes, no synovitis   Data Reviewed: I have personally reviewed following labs and imaging studies Basic Metabolic Panel:  Recent Labs Lab 05/11/17 0605 05/12/17 0700 05/13/17 0615 05/14/17 1206 05/16/17 0950 05/17/17 1041  NA 139 139 139 137 136 140  K 4.0 3.9 3.9 3.9 3.8 4.1  CL 94* 97* 95* 93* 91* 93*  CO2 38* 37* 40* 38* 37* 39*  GLUCOSE 80 79 99 161* 233* 166*  BUN 41* 40* 42* 40* 31* 33*  CREATININE 0.60 0.53 0.63 0.62 0.65 0.59  CALCIUM 8.1* 7.6* 7.9* 8.2* 8.2* 8.4*  MG 1.9  --   --   --   --   --    Liver Function Tests:  Recent Labs Lab 05/13/17 0615 05/17/17 1041  AST 25 45*  ALT 20 30  ALKPHOS 66 78  BILITOT 0.7 0.7  PROT 4.9* 5.3*  ALBUMIN 2.1* 2.3*   No results for input(s): LIPASE, AMYLASE in the last 168 hours. No results for input(s): AMMONIA in the last 168 hours. Coagulation Profile: No results for input(s): INR, PROTIME in the last 168 hours. CBC:  Recent Labs Lab 05/13/17 0615  WBC 4.4  HGB 10.8*  HCT 35.0*  MCV 88.8  PLT 207   Cardiac Enzymes: No results for input(s): CKTOTAL, CKMB, CKMBINDEX, TROPONINI in the last 168 hours. BNP: Invalid input(s): POCBNP CBG:  Recent Labs Lab 05/16/17 1609 05/16/17 2007 05/17/17 0722 05/17/17 0824 05/17/17 1137  GLUCAP 119* 133* 75 90 93   HbA1C: No results for input(s): HGBA1C in the last 72 hours. Urine analysis:    Component Value Date/Time   COLORURINE STRAW (A) 05/05/2017 1651   APPEARANCEUR CLEAR 05/05/2017 1651   LABSPEC 1.006 05/05/2017 1651   PHURINE 6.0 05/05/2017 1651   GLUCOSEU NEGATIVE 05/05/2017 1651   HGBUR SMALL (A) 05/05/2017 1651   BILIRUBINUR NEGATIVE 05/05/2017 1651   KETONESUR NEGATIVE 05/05/2017 1651   PROTEINUR NEGATIVE 05/05/2017 1651   UROBILINOGEN 0.2 04/19/2014 1730   NITRITE NEGATIVE 05/05/2017 1651   LEUKOCYTESUR TRACE (A) 05/05/2017 1651    Sepsis Labs: @LABRCNTIP (procalcitonin:4,lacticidven:4) ) No results found for this or any previous visit (from the past 240 hour(s)).   Scheduled Meds: . apixaban  2.5 mg Oral BID  . atorvastatin  20 mg Oral q1800  . diltiazem  180 mg Oral Daily  . feeding supplement (PRO-STAT SUGAR FREE 64)  30 mL Oral TID WC  . furosemide  80 mg Intravenous Q8H  . gabapentin  200 mg Oral Q8H  . levothyroxine  25 mcg Oral QPM  . mirtazapine  7.5 mg Oral QHS  . polyethylene glycol  17 g Oral Daily  . potassium chloride  40 mEq Oral Daily  . sodium chloride flush  3 mL Intravenous Q12H   Continuous Infusions: . sodium chloride      Procedures/Studies: US Venous Img Lower Bilateral  Result Date: 05/11/2017 CLINICAL DATA:  Chronic bilateral lower extremity pain and edema. History of breast cancer. Evaluate for DVT. EXAM: BILATERAL LOWER EXTREMITY VENOUS DOPPLER ULTRASOUND TECHNIQUE: Gray-scale sonography with graded compression, as well as color  Doppler and duplex ultrasound were performed to evaluate the lower extremity deep venous systems from the level of the common femoral vein and including the common femoral, femoral, profunda femoral, popliteal and calf veins including the posterior tibial, peroneal and gastrocnemius veins when visible. The superficial great saphenous vein was also interrogated. Spectral Doppler was utilized to evaluate flow at rest and with distal augmentation maneuvers in the common femoral, femoral and popliteal veins. COMPARISON:  Left lower extremity venous Doppler ultrasound - 03/04/2014 FINDINGS: RIGHT LOWER EXTREMITY Common Femoral Vein: No evidence of thrombus. Normal compressibility, respiratory phasicity and response to augmentation. Saphenofemoral Junction: No evidence of thrombus. Normal compressibility and flow on color Doppler imaging. Profunda Femoral Vein: No evidence of thrombus. Normal compressibility and flow on color Doppler imaging. Femoral Vein: No evidence of  thrombus. Normal compressibility, respiratory phasicity and response to augmentation. Popliteal Vein: No evidence of thrombus. Normal compressibility, respiratory phasicity and response to augmentation. Calf Veins: No evidence of thrombus. Normal compressibility and flow on color Doppler imaging. Superficial Great Saphenous Vein: No evidence of thrombus. Normal compressibility and flow on color Doppler imaging. Venous Reflux:  None. Other Findings: Subcutaneous edema is noted throughout the right lower extremity. LEFT LOWER EXTREMITY Common Femoral Vein: No evidence of thrombus. Normal compressibility, respiratory phasicity and response to augmentation. Saphenofemoral Junction: No evidence of thrombus. Normal compressibility and flow on color Doppler imaging. Profunda Femoral Vein: No evidence of thrombus. Normal compressibility and flow on color Doppler imaging. Femoral Vein: No evidence of thrombus. Normal compressibility, respiratory phasicity and response to augmentation. Popliteal Vein: No evidence of thrombus. Normal compressibility, respiratory phasicity and response to augmentation. Calf Veins: No evidence of thrombus. Normal compressibility and flow on color Doppler imaging. Superficial Great Saphenous Vein: No evidence of thrombus. Normal compressibility and flow on color Doppler imaging. Venous Reflux:  None. Other Findings: Subcutaneous edema is noted throughout the left lower extremity. IMPRESSION: No evidence of DVT within either lower extremity. Electronically Signed   By: Sandi Mariscal M.D.   On: 05/11/2017 14:11   US Venous Img Upper Uni Right  Result Date: 05/12/2017 CLINICAL DATA:  Right arm swelling for several days EXAM: RIGHT UPPER EXTREMITY VENOUS DOPPLER ULTRASOUND TECHNIQUE: Gray-scale sonography with graded compression, as well as color Doppler and duplex ultrasound were performed to evaluate the upper extremity deep venous system from the level of the subclavian vein and including the  jugular, axillary, basilic, radial, ulnar and upper cephalic vein. Spectral Doppler was utilized to evaluate flow at rest and with distal augmentation maneuvers. COMPARISON:  None. FINDINGS: Contralateral Subclavian Vein: Respiratory phasicity is normal and symmetric with the symptomatic side. No evidence of thrombus. Normal compressibility. Internal Jugular Vein: No evidence of thrombus. Normal compressibility, respiratory phasicity and response to augmentation. Subclavian Vein: No evidence of thrombus. Normal compressibility, respiratory phasicity and response to augmentation. Axillary Vein: No evidence of thrombus. Normal compressibility, respiratory phasicity and response to augmentation. Cephalic Vein: No evidence of thrombus. Normal compressibility, respiratory phasicity and response to augmentation. Basilic Vein: No evidence of thrombus. Normal compressibility, respiratory phasicity and response to augmentation. Brachial Veins: No evidence of thrombus. Normal compressibility, respiratory phasicity and response to augmentation. Radial Veins: No evidence of thrombus. Normal compressibility, respiratory phasicity and response to augmentation. Ulnar Veins: No evidence of thrombus. Normal compressibility, respiratory phasicity and response to augmentation. Venous Reflux:  None visualized. Other Findings: PICC line is noted without evidence of deep venous thrombosis. IMPRESSION: No evidence of DVT within the right upper extremity. Electronically Signed  By: Inez Catalina M.D.   On: 05/12/2017 15:47   Dg Chest Port 1 View  Result Date: 05/13/2017 CLINICAL DATA:  Central catheter placement with bleeding around catheter site EXAM: PORTABLE CHEST 1 VIEW COMPARISON:  May 05, 2017 FINDINGS: Port-A-Cath tip is in the right atrium, approximately 6 cm distal to the cavoatrial junction. No pneumothorax. There is cardiomegaly with pulmonary venous hypertension. There are bilateral pleural effusions with generalized  interstitial and patchy alveolar edema. There is aortic atherosclerosis. No adenopathy. There is degenerative change in each shoulder with superior migration of the right humeral head. IMPRESSION: Central catheter tip in right atrium. No pneumothorax. Underlying congestive heart failure. Edema and effusions appear stable compared to 1 day prior. There is aortic atherosclerosis. Chronic rotator cuff tear noted on the right with superior migration of the right humeral head. Aortic Atherosclerosis (ICD10-I70.0). Electronically Signed   By: Lowella Grip III M.D.   On: 05/13/2017 07:50   Dg Chest Port 1 View  Result Date: 05/05/2017 CLINICAL DATA:  Patient with swelling. EXAM: PORTABLE CHEST 1 VIEW COMPARISON:  Chest radiograph 03/07/2017. FINDINGS: Monitoring leads overlie the patient. Low lung volumes. Stable cardiomegaly with aortic atherosclerosis. Diffuse bilateral heterogeneous pulmonary opacities. Moderate layering bilateral pleural effusions. No pneumothorax. IMPRESSION: Findings most compatible with pulmonary edema and moderate layering bilateral pleural effusions. Cardiomegaly. Electronically Signed   By: Lovey Newcomer M.D.   On: 05/05/2017 15:03   Dg Abd Acute W/chest  Result Date: 05/15/2017 CLINICAL DATA:  Abdominal pain. History of CHF, bronchitis, breast cancer, heart failure, diabetes, hypertension. EXAM: DG ABDOMEN ACUTE W/ 1V CHEST COMPARISON:  05/13/2017 FINDINGS: Cardiac enlargement with pulmonary vascular congestion and bilateral perihilar infiltrates consistent with edema. Bilateral pleural effusions, greater on the right. There is progression since previous study. No pneumothorax. Calcified and tortuous aorta. Right PICC line remains unchanged in position. IMPRESSION: Cardiac enlargement with pulmonary vascular congestion and increasing perihilar edema. Increasing bilateral pleural effusions. Electronically Signed   By: Lucienne Capers M.D.   On: 05/15/2017 01:00     [facility] Pager [phone]  If 7PM-7AM, please contact night-coverage [email or web address] Password [email or web address] 05/17/2017, 2:19 PM   LOS: 12 days

## 2017-05-18 LAB — BASIC METABOLIC PANEL
Anion gap: 6 (ref 5–15)
BUN: 37 mg/dL — ABNORMAL HIGH (ref 6–20)
CALCIUM: 8.1 mg/dL — AB (ref 8.9–10.3)
CHLORIDE: 93 mmol/L — AB (ref 101–111)
CO2: 39 mmol/L — ABNORMAL HIGH (ref 22–32)
Creatinine, Ser: 0.61 mg/dL (ref 0.44–1.00)
GFR calc Af Amer: >60 mL/min (ref >60)
GFR calc non Af Amer: >60 mL/min (ref >60)
Glucose, Bld: 102 mg/dL — ABNORMAL HIGH (ref 65–99)
Potassium: 4.1 mmol/L (ref 3.5–5.1)
SODIUM: 138 mmol/L (ref 135–145)

## 2017-05-18 LAB — GLUCOSE, CAPILLARY
GLUCOSE-CAPILLARY: 88 mg/dL (ref 65–99)
GLUCOSE-CAPILLARY: 92 mg/dL (ref 65–99)
GLUCOSE-CAPILLARY: 122 mg/dL — AB (ref 65–99)
GLUCOSE-CAPILLARY: 150 mg/dL — AB (ref 65–99)

## 2017-05-18 MED ORDER — TORSEMIDE 20 MG PO TABS
40.0000 mg | ORAL_TABLET | Freq: Every day | ORAL | Status: DC
  Administered 2017-05-19: 40 mg via ORAL
  Filled 2017-05-18: qty 2

## 2017-05-18 MED ORDER — MILK AND MOLASSES ENEMA
1.0000 | Freq: Once | RECTAL | Status: AC
  Administered 2017-05-18: 250 mL via RECTAL

## 2017-05-18 NOTE — Progress Notes (Signed)
Milk and molasses enema given per Dr order.  Pt tolerated well.  Enema was effective with passage of large amount of stool.

## 2017-05-18 NOTE — Progress Notes (Signed)
PROGRESS NOTE  [name] FBP:[id number] DOB: Sep 30, 1920 DOA: 05/05/2017 PCP: Hilbert Corrigan, MD  Brief History: 83 y.o.femalehistory of afib, CAD with stent to LAD in 8242, chronic diastolic HF, HTN, hypothyroidism, hyperlipidemia, dementiaadmitted with lower extremity edema, weight gain. The family  reports gradual increase in swelling over the last several daysprior to admission. The patient was seen in the cardiology office on 05/05/2018. The patient was noted to have anasarca and worsening lower extremity edema. The patient was started on IV lasix. Cardiology was consulted to assist.  Assessment/Plan: Acute on chronic diastolic CHF -3.5T for the admission -Patient had dry weight of 123-125 in July 2019 -IV furosemide increased to 80 mg IV every 8 hours 05/11/17 -05/06/2017 echo EF--60-65%, RV pressure overload with moderate reduction in RV function. PASP 79 -NEG 20 lbs for the admit, (down 7 pounds since yesterday indicating that this may be inaccurate) -Appears to be approximately 10 pounds above dry weight. -although she is still edematous, overall edema has improved.  -BUN is trending up. Will change lasix to oral demadex. -bisoprolol stopped due to sinus pauses-->improved -d/c lisinopril and imdur due to soft BPs -using smaller cuff  -appreciate cardiology follow up -am BMP  Right Arm Erythema -has been transient and intermittent during hospitalization with blanchable and non-blanchable components -likely related to venous stasis from anasarca -will not start antibiotics as pt is afebrile and hemodynamically stable without leukocytosis   Permanent atrial fibrillation -Rate reasonablycontrolled -Continue diltiazem CD -CHADSVASc = 6 -continue apixaban  Sinus pauses/sinus bradycardia -resting HR in low 40s with up to 3 sec pauses -d/c bisoprolol-->improved -continue dilt -Heart rates remained variable, having episodes of tachycardia and  bradycardia -Not felt to be good candidate for pacemaker  Dementia -at baseline, no acute behavioral disturbances.  Hyperlipidemia -Continue Lipitor.  Hypertension -BPs soft now with diuresis -d/c bisoprolol due to pauses -d/c lisinopril and imdur due to soft BPs -Blood pressure now stable  DiabetesMellitus type 2 -Hold metformin. -03/08/17 A1C - 7.2 -Has had several instances of hypoglycemia, only receiving a sensitive sliding scale, continue to monitor. -hypoglycemia likely due to poor po intake -d/c ISS but continue to check CBGs  Severe protein calorie malnutrition -continue supplements  Hypokalemia -repleted -mag 1.9  Leg pain and edema -venous duplex legs--neg  R>LUE edema -duplex RUE--NEG DVT  Goals of Care -palliative medicine consulted   Disposition Plan: SNFin 2-3 days when cleared by cardiology Family Communication: No family at bedside   Consultants: cardiology, palliative medicine  Code Status: DNR  DVT Prophylaxis: apixaban   Procedures: As Listed in Progress Note Above  Antibiotics: None   Subjective: No nausea or vomiting. No chest pain   Objective: Vitals:   05/17/17 1045 05/17/17 1507 05/17/17 2046 05/18/17 0443  BP:  (!) 104/59 (!) 112/50 116/60  Pulse:  86 80 88  Resp:  18 18 18   Temp:  97.6 F (36.4 C) 97.6 F (36.4 C) 97.8 F (36.6 C)  TempSrc:   Axillary Axillary  SpO2: 98% 100% 98% 99%  Weight:   63 kg (138 lb 14.2 oz) 63.2 kg (139 lb 5.3 oz)  Height:        Intake/Output Summary (Last 24 hours) at 05/18/17 1537 Last data filed at 05/18/17 0835  Gross per 24 hour  Intake              120 ml  Output  2800 ml  Net            -2680 ml   Weight change: 1 kg (2 lb 3.3 oz) Exam:   General:  Pt is alert, follows commands appropriately, not in acute distress  HEENT: No icterus, No thrush, No neck mass, [place]/AT  Cardiovascular: IRRR, S1/S2, no rubs, no gallops  Respiratory:  bibasilar crackles, no wheeze  Abdomen: Soft/+BS, non tender, non distended, no guarding  Extremities: 1 +LE edema, No lymphangitis, No petechiae, No rashes, no synovitis   Data Reviewed: I have personally reviewed following labs and imaging studies Basic Metabolic Panel:  Recent Labs Lab 05/13/17 0615 05/14/17 1206 05/16/17 0950 05/17/17 1041 05/18/17 0514  NA 139 137 136 140 138  K 3.9 3.9 3.8 4.1 4.1  CL 95* 93* 91* 93* 93*  CO2 40* 38* 37* 39* 39*  GLUCOSE 99 161* 233* 166* 102*  BUN 42* 40* 31* 33* 37*  CREATININE 0.63 0.62 0.65 0.59 0.61  CALCIUM 7.9* 8.2* 8.2* 8.4* 8.1*   Liver Function Tests:  Recent Labs Lab 05/13/17 0615 05/17/17 1041  AST 25 45*  ALT 20 30  ALKPHOS 66 78  BILITOT 0.7 0.7  PROT 4.9* 5.3*  ALBUMIN 2.1* 2.3*   No results for input(s): LIPASE, AMYLASE in the last 168 hours. No results for input(s): AMMONIA in the last 168 hours. Coagulation Profile: No results for input(s): INR, PROTIME in the last 168 hours. CBC:  Recent Labs Lab 05/13/17 0615  WBC 4.4  HGB 10.8*  HCT 35.0*  MCV 88.8  PLT 207   Cardiac Enzymes: No results for input(s): CKTOTAL, CKMB, CKMBINDEX, TROPONINI in the last 168 hours. BNP: Invalid input(s): POCBNP CBG:  Recent Labs Lab 05/17/17 0824 05/17/17 1137 05/17/17 1640 05/18/17 0740 05/18/17 1132  GLUCAP 90 93 199* 88 92   HbA1C: No results for input(s): HGBA1C in the last 72 hours. Urine analysis:    Component Value Date/Time   COLORURINE STRAW (A) 05/05/2017 1651   APPEARANCEUR CLEAR 05/05/2017 1651   LABSPEC 1.006 05/05/2017 1651   PHURINE 6.0 05/05/2017 1651   GLUCOSEU NEGATIVE 05/05/2017 1651   HGBUR SMALL (A) 05/05/2017 1651   BILIRUBINUR NEGATIVE 05/05/2017 1651   KETONESUR NEGATIVE 05/05/2017 1651   PROTEINUR NEGATIVE 05/05/2017 1651   UROBILINOGEN 0.2 04/19/2014 1730   NITRITE NEGATIVE 05/05/2017 1651   LEUKOCYTESUR TRACE (A) 05/05/2017 1651   Sepsis  Labs: @LABRCNTIP (procalcitonin:4,lacticidven:4) ) No results found for this or any previous visit (from the past 240 hour(s)).   Scheduled Meds: . alteplase  2 mg Intracatheter Once  . apixaban  2.5 mg Oral BID  . atorvastatin  20 mg Oral q1800  . diltiazem  180 mg Oral Daily  . feeding supplement (PRO-STAT SUGAR FREE 64)  30 mL Oral TID WC  . gabapentin  200 mg Oral Q8H  . levothyroxine  25 mcg Oral QPM  . milk and molasses  1 enema Rectal Once  . mirtazapine  7.5 mg Oral QHS  . polyethylene glycol  17 g Oral Daily  . potassium chloride  40 mEq Oral Daily  . sodium chloride flush  3 mL Intravenous Q12H  . [START ON 05/19/2017] torsemide  40 mg Oral Daily   Continuous Infusions: . sodium chloride      Procedures/Studies: US Venous Img Lower Bilateral  Result Date: 05/11/2017 CLINICAL DATA:  Chronic bilateral lower extremity pain and edema. History of breast cancer. Evaluate for DVT. EXAM: BILATERAL LOWER EXTREMITY VENOUS DOPPLER ULTRASOUND TECHNIQUE: Gray-scale  sonography with graded compression, as well as color Doppler and duplex ultrasound were performed to evaluate the lower extremity deep venous systems from the level of the common femoral vein and including the common femoral, femoral, profunda femoral, popliteal and calf veins including the posterior tibial, peroneal and gastrocnemius veins when visible. The superficial great saphenous vein was also interrogated. Spectral Doppler was utilized to evaluate flow at rest and with distal augmentation maneuvers in the common femoral, femoral and popliteal veins. COMPARISON:  Left lower extremity venous Doppler ultrasound - 03/04/2014 FINDINGS: RIGHT LOWER EXTREMITY Common Femoral Vein: No evidence of thrombus. Normal compressibility, respiratory phasicity and response to augmentation. Saphenofemoral Junction: No evidence of thrombus. Normal compressibility and flow on color Doppler imaging. Profunda Femoral Vein: No evidence of thrombus.  Normal compressibility and flow on color Doppler imaging. Femoral Vein: No evidence of thrombus. Normal compressibility, respiratory phasicity and response to augmentation. Popliteal Vein: No evidence of thrombus. Normal compressibility, respiratory phasicity and response to augmentation. Calf Veins: No evidence of thrombus. Normal compressibility and flow on color Doppler imaging. Superficial Great Saphenous Vein: No evidence of thrombus. Normal compressibility and flow on color Doppler imaging. Venous Reflux:  None. Other Findings: Subcutaneous edema is noted throughout the right lower extremity. LEFT LOWER EXTREMITY Common Femoral Vein: No evidence of thrombus. Normal compressibility, respiratory phasicity and response to augmentation. Saphenofemoral Junction: No evidence of thrombus. Normal compressibility and flow on color Doppler imaging. Profunda Femoral Vein: No evidence of thrombus. Normal compressibility and flow on color Doppler imaging. Femoral Vein: No evidence of thrombus. Normal compressibility, respiratory phasicity and response to augmentation. Popliteal Vein: No evidence of thrombus. Normal compressibility, respiratory phasicity and response to augmentation. Calf Veins: No evidence of thrombus. Normal compressibility and flow on color Doppler imaging. Superficial Great Saphenous Vein: No evidence of thrombus. Normal compressibility and flow on color Doppler imaging. Venous Reflux:  None. Other Findings: Subcutaneous edema is noted throughout the left lower extremity. IMPRESSION: No evidence of DVT within either lower extremity. Electronically Signed   By: Sandi Mariscal M.D.   On: 05/11/2017 14:11   US Venous Img Upper Uni Right  Result Date: 05/12/2017 CLINICAL DATA:  Right arm swelling for several days EXAM: RIGHT UPPER EXTREMITY VENOUS DOPPLER ULTRASOUND TECHNIQUE: Gray-scale sonography with graded compression, as well as color Doppler and duplex ultrasound were performed to evaluate the upper  extremity deep venous system from the level of the subclavian vein and including the jugular, axillary, basilic, radial, ulnar and upper cephalic vein. Spectral Doppler was utilized to evaluate flow at rest and with distal augmentation maneuvers. COMPARISON:  None. FINDINGS: Contralateral Subclavian Vein: Respiratory phasicity is normal and symmetric with the symptomatic side. No evidence of thrombus. Normal compressibility. Internal Jugular Vein: No evidence of thrombus. Normal compressibility, respiratory phasicity and response to augmentation. Subclavian Vein: No evidence of thrombus. Normal compressibility, respiratory phasicity and response to augmentation. Axillary Vein: No evidence of thrombus. Normal compressibility, respiratory phasicity and response to augmentation. Cephalic Vein: No evidence of thrombus. Normal compressibility, respiratory phasicity and response to augmentation. Basilic Vein: No evidence of thrombus. Normal compressibility, respiratory phasicity and response to augmentation. Brachial Veins: No evidence of thrombus. Normal compressibility, respiratory phasicity and response to augmentation. Radial Veins: No evidence of thrombus. Normal compressibility, respiratory phasicity and response to augmentation. Ulnar Veins: No evidence of thrombus. Normal compressibility, respiratory phasicity and response to augmentation. Venous Reflux:  None visualized. Other Findings: PICC line is noted without evidence of deep venous thrombosis. IMPRESSION: No evidence of  DVT within the right upper extremity. Electronically Signed   By: Inez Catalina M.D.   On: 05/12/2017 15:47   Dg Chest Port 1 View  Result Date: 05/13/2017 CLINICAL DATA:  Central catheter placement with bleeding around catheter site EXAM: PORTABLE CHEST 1 VIEW COMPARISON:  May 05, 2017 FINDINGS: Port-A-Cath tip is in the right atrium, approximately 6 cm distal to the cavoatrial junction. No pneumothorax. There is cardiomegaly with  pulmonary venous hypertension. There are bilateral pleural effusions with generalized interstitial and patchy alveolar edema. There is aortic atherosclerosis. No adenopathy. There is degenerative change in each shoulder with superior migration of the right humeral head. IMPRESSION: Central catheter tip in right atrium. No pneumothorax. Underlying congestive heart failure. Edema and effusions appear stable compared to 1 day prior. There is aortic atherosclerosis. Chronic rotator cuff tear noted on the right with superior migration of the right humeral head. Aortic Atherosclerosis (ICD10-I70.0). Electronically Signed   By: Lowella Grip III M.D.   On: 05/13/2017 07:50   Dg Chest Port 1 View  Result Date: 05/05/2017 CLINICAL DATA:  Patient with swelling. EXAM: PORTABLE CHEST 1 VIEW COMPARISON:  Chest radiograph 03/07/2017. FINDINGS: Monitoring leads overlie the patient. Low lung volumes. Stable cardiomegaly with aortic atherosclerosis. Diffuse bilateral heterogeneous pulmonary opacities. Moderate layering bilateral pleural effusions. No pneumothorax. IMPRESSION: Findings most compatible with pulmonary edema and moderate layering bilateral pleural effusions. Cardiomegaly. Electronically Signed   By: Lovey Newcomer M.D.   On: 05/05/2017 15:03   Dg Abd Acute W/chest  Result Date: 05/15/2017 CLINICAL DATA:  Abdominal pain. History of CHF, bronchitis, breast cancer, heart failure, diabetes, hypertension. EXAM: DG ABDOMEN ACUTE W/ 1V CHEST COMPARISON:  05/13/2017 FINDINGS: Cardiac enlargement with pulmonary vascular congestion and bilateral perihilar infiltrates consistent with edema. Bilateral pleural effusions, greater on the right. There is progression since previous study. No pneumothorax. Calcified and tortuous aorta. Right PICC line remains unchanged in position. IMPRESSION: Cardiac enlargement with pulmonary vascular congestion and increasing perihilar edema. Increasing bilateral pleural effusions.  Electronically Signed   By: Lucienne Capers M.D.   On: 05/15/2017 01:00    [facility] Pager [phone]  If 7PM-7AM, please contact night-coverage [email or web address] Password [email or web address] 05/18/2017, 3:37 PM   LOS: 13 days

## 2017-05-19 LAB — BASIC METABOLIC PANEL
Anion gap: 10 (ref 5–15)
BUN: 38 mg/dL — ABNORMAL HIGH (ref 6–20)
CO2: 37 mmol/L — ABNORMAL HIGH (ref 22–32)
Calcium: 8.4 mg/dL — ABNORMAL LOW (ref 8.9–10.3)
Chloride: 89 mmol/L — ABNORMAL LOW (ref 101–111)
Creatinine, Ser: 0.63 mg/dL (ref 0.44–1.00)
GFR calc Af Amer: >60 mL/min (ref >60)
GFR calc non Af Amer: >60 mL/min (ref >60)
Glucose, Bld: 108 mg/dL — ABNORMAL HIGH (ref 65–99)
Potassium: 4.5 mmol/L (ref 3.5–5.1)
Sodium: 136 mmol/L (ref 135–145)

## 2017-05-19 LAB — GLUCOSE, CAPILLARY
GLUCOSE-CAPILLARY: 118 mg/dL — AB (ref 65–99)
Glucose-Capillary: 101 mg/dL — ABNORMAL HIGH (ref 65–99)

## 2017-05-19 MED ORDER — DILTIAZEM HCL ER COATED BEADS 240 MG PO CP24: 240.0000 mg | ORAL_CAPSULE | Freq: Every day | ORAL | Status: AC

## 2017-05-19 MED ORDER — TORSEMIDE 20 MG PO TABS: 40.0000 mg | ORAL_TABLET | Freq: Every day | ORAL | Status: DC

## 2017-05-19 MED ORDER — HYDROCODONE-ACETAMINOPHEN 5-325 MG PO TABS: 1.0000 | ORAL_TABLET | ORAL | 0 refills | Status: AC | PRN

## 2017-05-19 MED ORDER — FLEET ENEMA 7-19 GM/118ML RE ENEM: 1.0000 | ENEMA | Freq: Every day | RECTAL | 0 refills | Status: AC | PRN

## 2017-05-19 MED ORDER — POTASSIUM CHLORIDE CRYS ER 20 MEQ PO TBCR: 40.0000 meq | EXTENDED_RELEASE_TABLET | Freq: Every day | ORAL | Status: DC

## 2017-05-19 NOTE — Discharge Summary (Signed)
Physician Discharge Summary  [name] ZDG:[id number] DOB: 1920/12/14 DOA: 05/05/2017  PCP: Hilbert Corrigan, MD  Admit date: 05/05/2017 Discharge date: 05/19/2017  Admitted From: SNF Disposition:  SNF  Recommendations for Outpatient Follow-up:  1. Follow up with PCP in 1-2 weeks 2. Please obtain BMP/CBC in one week 3. Would strongly recommend that hospice services follow patient at SNF to provide education to patient and family regarding end of life issues   Discharge Condition: stable CODE STATUS:  DNR Diet recommendation: Heart Healthy / Carb Modified   Brief/Interim Summary: 83 y.o.femalehistory of afib, CAD with stent to LAD in 5956, chronic diastolic HF, HTN, hypothyroidism, hyperlipidemia, dementiaadmitted with lower extremity edema, weight gain. The family  reports gradual increase in swelling over the last several daysprior to admission. The patient was seen in the cardiology office on 05/05/2018. The patient was noted to have anasarca and worsening lower extremity edema. The patient was started on IV lasix. Cardiology was consulted to assist.  Discharge Diagnoses:  Principal Problem:   Anasarca Active Problems:   Type 2 diabetes mellitus ([place])   Hyperlipidemia   Hypertension   Acute on chronic diastolic heart failure (HCC)   Chronic anticoagulation   Lewy body dementia without behavioral disturbance   Permanent atrial fibrillation (HCC)   Severe protein-calorie malnutrition (HCC)   Acute on chronic congestive heart failure ([place])   Palliative care encounter   Bleeding from PICC line, sequela   Goals of care, counseling/discussion  Acute on chronic diastolic CHF/Right sided heart failure -12.1L for the admission -Patient had dry weight of 123-125 in July 2019 - She was treated with IV furosemide 80 mg IV every 8 hours  -05/06/2017 echo EF--60-65%, RV pressure overload with moderate reduction in RV function. PASP 79 -NEG 20 lbs for the admit, (although  since she cannot stand, this is on the bedscale and may not be accurate -although she is still edematous, overall edema has improved.  -BUN is trending up so IV lasix changed to oral demadex. -bisoprolol stopped due to sinus pauses-->improved -d/c lisinopril and imdur due to soft BPs -using smaller cuff  -appreciate cardiology follow up  Right Arm Erythema -has been transient and intermittent during hospitalization with blanchable and non-blanchable components -likely related to venous stasis from anasarca -will not start antibiotics as pt is afebrile and hemodynamically stable without leukocytosis   Permanent atrial fibrillation -Rate reasonablycontrolled -Continue diltiazem CD -CHADSVASc = 6 -continue apixaban  Sinus pauses/sinus bradycardia -resting HR in low 40s with up to 3 sec pauses -d/c bisoprolol-->improved -continue dilt -Heart rates remained variable, having episodes of tachycardia and bradycardia -Not felt to be good candidate for pacemaker  Dementia -at baseline, no acute behavioral disturbances.  Hyperlipidemia -Continue Lipitor.  Hypertension -BPs soft now with diuresis -d/c bisoprololdue to pauses -d/c lisinopril and imdur due to soft BPs -Blood pressure now stable  DiabetesMellitus type 2 -Hold metformin. -03/08/17 A1C - 7.2 -Has had several instances of hypoglycemia, only receiving a sensitive sliding scale, continue to monitor. -hypoglycemia likely due to poor po intake -d/c ISS but continue to check CBGs -blood sugars now stable, on no therapy  Severe protein calorie malnutrition -continue supplements  Hypokalemia -repleted -mag 1.9  Leg pain and edema -venous duplex legs--neg  R>LUE edema -duplex RUE--NEG DVT  Goals of Care -palliative medicine consulted and met with patient's son. He has agreed to DNR, but does not accept hospice services. He does not appear to be ready to accept patient's poor prognosis. This will  likely lead to recurrent admissions for the patient. Would strongly recommend that patient have hospice services at SNF to help educate her son regarding end of life issues.  Discharge Instructions  Discharge Instructions    Diet - low sodium heart healthy    Complete by:  As directed    Increase activity slowly    Complete by:  As directed      Allergies as of 05/19/2017   No Known Allergies     Medication List    STOP taking these medications   bisoprolol 5 MG tablet Commonly known as:  ZEBETA   furosemide 40 MG tablet Commonly known as:  LASIX   isosorbide mononitrate 30 MG 24 hr tablet Commonly known as:  IMDUR   metFORMIN 500 MG tablet Commonly known as:  GLUCOPHAGE     TAKE these medications   apixaban 2.5 MG Tabs tablet Commonly known as:  ELIQUIS Take 1 tablet (2.5 mg total) by mouth 2 (two) times daily.   atorvastatin 20 MG tablet Commonly known as:  LIPITOR Take 20 mg by mouth daily.   calcium carbonate 500 MG chewable tablet Commonly known as:  TUMS - dosed in mg elemental calcium Chew 1 tablet by mouth 3 (three) times daily before meals.   cholecalciferol 1000 units tablet Commonly known as:  VITAMIN D Take 1,000 Units by mouth daily.   diltiazem 240 MG 24 hr capsule Commonly known as:  DILTIAZEM CD Take 1 capsule (240 mg total) by mouth daily. What changed:  medication strength  how much to take   feeding supplement (PRO-STAT SUGAR FREE 64) Liqd Take 30 mLs by mouth 3 (three) times daily with meals.   gabapentin 100 MG capsule Commonly known as:  NEURONTIN Take 200 mg by mouth every 8 (eight) hours.   HYDROcodone-acetaminophen 5-325 MG tablet Commonly known as:  NORCO/VICODIN Take 1-2 tablets by mouth every 4 (four) hours as needed for moderate pain.   levalbuterol 0.63 MG/3ML nebulizer solution Commonly known as:  XOPENEX Inhale 3 mLs into the lungs 3 (three) times daily as needed for wheezing or cough. Via nebulizer   levothyroxine  25 MCG tablet Commonly known as:  SYNTHROID, LEVOTHROID Take 25 mcg by mouth every evening.   mirtazapine 7.5 MG tablet Commonly known as:  REMERON Take 7.5 mg by mouth at bedtime.   ondansetron 4 MG tablet Commonly known as:  ZOFRAN Take 1 mg by mouth every 6 (six) hours as needed for nausea or vomiting.   polyethylene glycol packet Commonly known as:  MIRALAX / GLYCOLAX Take 17 g by mouth daily.   potassium chloride SA 20 MEQ tablet Commonly known as:  K-DUR,KLOR-CON Take 2 tablets (40 mEq total) by mouth daily.   PROBIOTIC FORMULA PO Take 1 capsule by mouth daily.   sodium phosphate 7-19 GM/118ML Enem Place 133 mLs (1 enema total) rectally daily as needed for mild constipation or severe constipation.   torsemide 20 MG tablet Commonly known as:  DEMADEX Take 2 tablets (40 mg total) by mouth daily.       No Known Allergies  Consultations:  Cardiology  Palliative care   Procedures/Studies: US Venous Img Lower Bilateral  Result Date: 05/11/2017 CLINICAL DATA:  Chronic bilateral lower extremity pain and edema. History of breast cancer. Evaluate for DVT. EXAM: BILATERAL LOWER EXTREMITY VENOUS DOPPLER ULTRASOUND TECHNIQUE: Gray-scale sonography with graded compression, as well as color Doppler and duplex ultrasound were performed to evaluate the lower extremity deep venous systems from the level  of the common femoral vein and including the common femoral, femoral, profunda femoral, popliteal and calf veins including the posterior tibial, peroneal and gastrocnemius veins when visible. The superficial great saphenous vein was also interrogated. Spectral Doppler was utilized to evaluate flow at rest and with distal augmentation maneuvers in the common femoral, femoral and popliteal veins. COMPARISON:  Left lower extremity venous Doppler ultrasound - 03/04/2014 FINDINGS: RIGHT LOWER EXTREMITY Common Femoral Vein: No evidence of thrombus. Normal compressibility, respiratory  phasicity and response to augmentation. Saphenofemoral Junction: No evidence of thrombus. Normal compressibility and flow on color Doppler imaging. Profunda Femoral Vein: No evidence of thrombus. Normal compressibility and flow on color Doppler imaging. Femoral Vein: No evidence of thrombus. Normal compressibility, respiratory phasicity and response to augmentation. Popliteal Vein: No evidence of thrombus. Normal compressibility, respiratory phasicity and response to augmentation. Calf Veins: No evidence of thrombus. Normal compressibility and flow on color Doppler imaging. Superficial Great Saphenous Vein: No evidence of thrombus. Normal compressibility and flow on color Doppler imaging. Venous Reflux:  None. Other Findings: Subcutaneous edema is noted throughout the right lower extremity. LEFT LOWER EXTREMITY Common Femoral Vein: No evidence of thrombus. Normal compressibility, respiratory phasicity and response to augmentation. Saphenofemoral Junction: No evidence of thrombus. Normal compressibility and flow on color Doppler imaging. Profunda Femoral Vein: No evidence of thrombus. Normal compressibility and flow on color Doppler imaging. Femoral Vein: No evidence of thrombus. Normal compressibility, respiratory phasicity and response to augmentation. Popliteal Vein: No evidence of thrombus. Normal compressibility, respiratory phasicity and response to augmentation. Calf Veins: No evidence of thrombus. Normal compressibility and flow on color Doppler imaging. Superficial Great Saphenous Vein: No evidence of thrombus. Normal compressibility and flow on color Doppler imaging. Venous Reflux:  None. Other Findings: Subcutaneous edema is noted throughout the left lower extremity. IMPRESSION: No evidence of DVT within either lower extremity. Electronically Signed   By: Sandi Mariscal M.D.   On: 05/11/2017 14:11   US Venous Img Upper Uni Right  Result Date: 05/12/2017 CLINICAL DATA:  Right arm swelling for several days  EXAM: RIGHT UPPER EXTREMITY VENOUS DOPPLER ULTRASOUND TECHNIQUE: Gray-scale sonography with graded compression, as well as color Doppler and duplex ultrasound were performed to evaluate the upper extremity deep venous system from the level of the subclavian vein and including the jugular, axillary, basilic, radial, ulnar and upper cephalic vein. Spectral Doppler was utilized to evaluate flow at rest and with distal augmentation maneuvers. COMPARISON:  None. FINDINGS: Contralateral Subclavian Vein: Respiratory phasicity is normal and symmetric with the symptomatic side. No evidence of thrombus. Normal compressibility. Internal Jugular Vein: No evidence of thrombus. Normal compressibility, respiratory phasicity and response to augmentation. Subclavian Vein: No evidence of thrombus. Normal compressibility, respiratory phasicity and response to augmentation. Axillary Vein: No evidence of thrombus. Normal compressibility, respiratory phasicity and response to augmentation. Cephalic Vein: No evidence of thrombus. Normal compressibility, respiratory phasicity and response to augmentation. Basilic Vein: No evidence of thrombus. Normal compressibility, respiratory phasicity and response to augmentation. Brachial Veins: No evidence of thrombus. Normal compressibility, respiratory phasicity and response to augmentation. Radial Veins: No evidence of thrombus. Normal compressibility, respiratory phasicity and response to augmentation. Ulnar Veins: No evidence of thrombus. Normal compressibility, respiratory phasicity and response to augmentation. Venous Reflux:  None visualized. Other Findings: PICC line is noted without evidence of deep venous thrombosis. IMPRESSION: No evidence of DVT within the right upper extremity. Electronically Signed   By: Inez Catalina M.D.   On: 05/12/2017 15:47   Dg Chest [facility]  1 View  Result Date: 05/13/2017 CLINICAL DATA:  Central catheter placement with bleeding around catheter site EXAM:  PORTABLE CHEST 1 VIEW COMPARISON:  May 05, 2017 FINDINGS: Port-A-Cath tip is in the right atrium, approximately 6 cm distal to the cavoatrial junction. No pneumothorax. There is cardiomegaly with pulmonary venous hypertension. There are bilateral pleural effusions with generalized interstitial and patchy alveolar edema. There is aortic atherosclerosis. No adenopathy. There is degenerative change in each shoulder with superior migration of the right humeral head. IMPRESSION: Central catheter tip in right atrium. No pneumothorax. Underlying congestive heart failure. Edema and effusions appear stable compared to 1 day prior. There is aortic atherosclerosis. Chronic rotator cuff tear noted on the right with superior migration of the right humeral head. Aortic Atherosclerosis (ICD10-I70.0). Electronically Signed   By: Lowella Grip III M.D.   On: 05/13/2017 07:50   Dg Chest Port 1 View  Result Date: 05/05/2017 CLINICAL DATA:  Patient with swelling. EXAM: PORTABLE CHEST 1 VIEW COMPARISON:  Chest radiograph 03/07/2017. FINDINGS: Monitoring leads overlie the patient. Low lung volumes. Stable cardiomegaly with aortic atherosclerosis. Diffuse bilateral heterogeneous pulmonary opacities. Moderate layering bilateral pleural effusions. No pneumothorax. IMPRESSION: Findings most compatible with pulmonary edema and moderate layering bilateral pleural effusions. Cardiomegaly. Electronically Signed   By: Lovey Newcomer M.D.   On: 05/05/2017 15:03   Dg Abd Acute W/chest  Result Date: 05/15/2017 CLINICAL DATA:  Abdominal pain. History of CHF, bronchitis, breast cancer, heart failure, diabetes, hypertension. EXAM: DG ABDOMEN ACUTE W/ 1V CHEST COMPARISON:  05/13/2017 FINDINGS: Cardiac enlargement with pulmonary vascular congestion and bilateral perihilar infiltrates consistent with edema. Bilateral pleural effusions, greater on the right. There is progression since previous study. No pneumothorax. Calcified and tortuous  aorta. Right PICC line remains unchanged in position. IMPRESSION: Cardiac enlargement with pulmonary vascular congestion and increasing perihilar edema. Increasing bilateral pleural effusions. Electronically Signed   By: Lucienne Capers M.D.   On: 05/15/2017 01:00    Echo: - Left ventricle: The cavity size was normal. Wall thickness was   increased in a pattern of moderate LVH. Systolic function was   normal. The estimated ejection fraction was in the range of 60%   to 65%. Wall motion was normal; there were no regional wall   motion abnormalities. - Aortic valve: Severely calcified annulus. Trileaflet; severely   thickened leaflets. There was moderate stenosis. Lower gradient   than expected for moderate AS likely due to low stroke volume   index (paradoxical low flow low gradient AS). Morphologically and   by AVA VTI there is at least moderate AS. Mean gradient (S): 7 mm   Hg. VTI ratio of LVOT to aortic valve: 0.37. Valve area (VTI):   1.17 cm^2. Valve area (Vmax): 0.91 cm^2. - Mitral valve: Moderately calcified annulus. Mildly thickened   leaflets . There was moderate regurgitation. The MR is eccentric   and may be underestimated. The MR VC is 0.4 cm. - Left atrium: The atrium was massively dilated. - Right ventricle: The ventricular septum is flattened in systole   consistent with RV pressure overload. The cavity size was   moderately dilated. Systolic function was moderately reduced.   TAPSE: 10 mm . - Right atrium: The atrium was massively dilated. - Atrial septum: No defect or patent foramen ovale was identified. - Tricuspid valve: There was mild-moderate regurgitation. - Pulmonary arteries: Systolic pressure was severely increased. PA   peak pressure: 79 mm Hg (S). - Inferior vena cava: The vessel was dilated. The respirophasic  diameter changes were blunted (< 50%), consistent with elevated   central venous pressure. - Pericardium, extracardiac: There is a large left  pleural   effusion.   Subjective: Has pain in legs bilaterally. Denies any shortness of breath  Discharge Exam: Vitals:   05/18/17 2206 05/19/17 0449  BP: 119/79 140/79  Pulse: 91 100  Resp: 18 18  Temp: 98 F (36.7 C) (!) 97.4 F (36.3 C)  SpO2: 100% 96%   Vitals:   05/18/17 1755 05/18/17 2206 05/19/17 0240 05/19/17 0449  BP: 119/63 119/79  140/79  Pulse: 93 91  100  Resp: 18 18  18   Temp: 98.3 F (36.8 C) 98 F (36.7 C)  (!) 97.4 F (36.3 C)  TempSrc:  Oral  Oral  SpO2: 100% 100%  96%  Weight:   63 kg (138 lb 14.2 oz)   Height:        General: Pt is alert, awake, not in acute distress Cardiovascular: RRR, S1/S2 +, no rubs, no gallops Respiratory: CTA bilaterally, no wheezing, no rhonchi Abdominal: Soft, NT, ND, bowel sounds + Extremities: 1+ generalized anasarca, no cyanosis    The results of significant diagnostics from this hospitalization (including imaging, microbiology, ancillary and laboratory) are listed below for reference.     Microbiology: No results found for this or any previous visit (from the past 240 hour(s)).   Labs: BNP (last 3 results)  Recent Labs  12/19/16 1449 02/17/17 0950 05/05/17 1516  BNP 1,286.0* 1,190.0* 8,590.9*   Basic Metabolic Panel:  Recent Labs Lab 05/14/17 1206 05/16/17 0950 05/17/17 1041 05/18/17 0514 05/19/17 0442  NA 137 136 140 138 136  K 3.9 3.8 4.1 4.1 4.5  CL 93* 91* 93* 93* 89*  CO2 38* 37* 39* 39* 37*  GLUCOSE 161* 233* 166* 102* 108*  BUN 40* 31* 33* 37* 38*  CREATININE 0.62 0.65 0.59 0.61 0.63  CALCIUM 8.2* 8.2* 8.4* 8.1* 8.4*   Liver Function Tests:  Recent Labs Lab 05/13/17 0615 05/17/17 1041  AST 25 45*  ALT 20 30  ALKPHOS 66 78  BILITOT 0.7 0.7  PROT 4.9* 5.3*  ALBUMIN 2.1* 2.3*   No results for input(s): LIPASE, AMYLASE in the last 168 hours. No results for input(s): AMMONIA in the last 168 hours. CBC:  Recent Labs Lab 05/13/17 0615  WBC 4.4  HGB 10.8*  HCT 35.0*  MCV  88.8  PLT 207   Cardiac Enzymes: No results for input(s): CKTOTAL, CKMB, CKMBINDEX, TROPONINI in the last 168 hours. BNP: Invalid input(s): POCBNP CBG:  Recent Labs Lab 05/18/17 1132 05/18/17 1631 05/18/17 2021 05/19/17 0750 05/19/17 1207  GLUCAP 92 122* 150* 101* 118*   D-Dimer No results for input(s): DDIMER in the last 72 hours. Hgb A1c No results for input(s): HGBA1C in the last 72 hours. Lipid Profile No results for input(s): CHOL, HDL, LDLCALC, TRIG, CHOLHDL, LDLDIRECT in the last 72 hours. Thyroid function studies No results for input(s): TSH, T4TOTAL, T3FREE, THYROIDAB in the last 72 hours.  Invalid input(s): FREET3 Anemia work up No results for input(s): VITAMINB12, FOLATE, FERRITIN, TIBC, IRON, RETICCTPCT in the last 72 hours. Urinalysis    Component Value Date/Time   COLORURINE STRAW (A) 05/05/2017 1651   APPEARANCEUR CLEAR 05/05/2017 1651   LABSPEC 1.006 05/05/2017 1651   PHURINE 6.0 05/05/2017 1651   GLUCOSEU NEGATIVE 05/05/2017 1651   HGBUR SMALL (A) 05/05/2017 1651   BILIRUBINUR NEGATIVE 05/05/2017 1651   KETONESUR NEGATIVE 05/05/2017 1651   PROTEINUR NEGATIVE 05/05/2017 1651  UROBILINOGEN 0.2 04/19/2014 1730   NITRITE NEGATIVE 05/05/2017 1651   LEUKOCYTESUR TRACE (A) 05/05/2017 1651   Sepsis Labs Invalid input(s): PROCALCITONIN,  WBC,  LACTICIDVEN Microbiology No results found for this or any previous visit (from the past 240 hour(s)).   Time coordinating discharge: Over 30 minutes  SIGNED:   Kathie Dike, MD  [facility] 05/19/2017, 1:06 PM Pager   If 7PM-7AM, please contact night-coverage [email or web address] Password [email or web address]

## 2017-05-19 NOTE — Care Management Important Message (Signed)
Important Message  Patient Details  Name: [name] MRN: [id number] Date of Birth: [birth year]   Medicare Important Message Given:  Yes    Sherald Barge, RN 05/19/2017, 11:13 AM

## 2017-05-19 NOTE — Clinical Social Work Note (Signed)
Message left for facility ([name]) and son Chrissie Noa) advising of discharge.   Discharge clinicals sent via [employer].    LCSW signing off.     Trey Gulbranson, [name], LCSW

## 2017-05-19 NOTE — Progress Notes (Signed)
Progress Note  Patient Name: [name] Date of Encounter: 05/19/2017  Primary Cardiologist: Dr.Suresh Bronson Ing   Subjective  Moaning at times. Awakens to verbal stimuli. No complaints. Some mild confusion.    Inpatient Medications    Scheduled Meds: . alteplase  2 mg Intracatheter Once  . apixaban  2.5 mg Oral BID  . atorvastatin  20 mg Oral q1800  . diltiazem  180 mg Oral Daily  . feeding supplement (PRO-STAT SUGAR FREE 64)  30 mL Oral TID WC  . gabapentin  200 mg Oral Q8H  . levothyroxine  25 mcg Oral QPM  . mirtazapine  7.5 mg Oral QHS  . polyethylene glycol  17 g Oral Daily  . potassium chloride  40 mEq Oral Daily  . sodium chloride flush  3 mL Intravenous Q12H  . torsemide  40 mg Oral Daily   Continuous Infusions: . sodium chloride     PRN Meds: sodium chloride, acetaminophen, HYDROcodone-acetaminophen, ondansetron (ZOFRAN) IV, sodium chloride flush   Vital Signs    Vitals:   05/18/17 1755 05/18/17 2206 05/19/17 0240 05/19/17 0449  BP: 119/63 119/79  140/79  Pulse: 93 91  100  Resp: 18 18  18   Temp: 98.3 F (36.8 C) 98 F (36.7 C)  (!) 97.4 F (36.3 C)  TempSrc:  Oral  Oral  SpO2: 100% 100%  96%  Weight:   138 lb 14.2 oz (63 kg)   Height:        Intake/Output Summary (Last 24 hours) at 05/19/17 0956 Last data filed at 05/19/17 0451  Gross per 24 hour  Intake                0 ml  Output             1800 ml  Net            -1800 ml   Filed Weights   05/17/17 2046 05/18/17 0443 05/19/17 0240  Weight: 138 lb 14.2 oz (63 kg) 139 lb 5.3 oz (63.2 kg) 138 lb 14.2 oz (63 kg)    Telemetry    No telemetry   Physical Exam   Lethargic Chronically ill white female  HEENT: normal Neck supple with no adenopathy JVP normal no bruits no thyromegaly Lungs clear with no wheezing and good diaphragmatic motion Heart:  S1/S2 AS  murmur, no rub, gallop or click PMI normal Abdomen: benighn, BS positve, no tenderness, no AAA no bruit.  No HSM or  HJR Distal pulses intact with no bruits Plus 2-3 edema with tegaderm dressing on feet  Neuro non-focal Skin warm and dry No muscular weakness    Labs    Chemistry Recent Labs Lab 05/13/17 0615  05/17/17 1041 05/18/17 0514 05/19/17 0442  NA 139  < > 140 138 136  K 3.9  < > 4.1 4.1 4.5  CL 95*  < > 93* 93* 89*  CO2 40*  < > 39* 39* 37*  GLUCOSE 99  < > 166* 102* 108*  BUN 42*  < > 33* 37* 38*  CREATININE 0.63  < > 0.59 0.61 0.63  CALCIUM 7.9*  < > 8.4* 8.1* 8.4*  PROT 4.9*  --  5.3*  --   --   ALBUMIN 2.1*  --  2.3*  --   --   AST 25  --  45*  --   --   ALT 20  --  30  --   --   ALKPHOS 66  --  78  --   --   BILITOT 0.7  --  0.7  --   --   GFRNONAA >60  < > >60 >60 >60  GFRAA >60  < > >60 >60 >60  ANIONGAP 4*  < > 8 6 10   < > = values in this interval not displayed.   Hematology Recent Labs Lab 05/13/17 0615  WBC 4.4  RBC 3.94  HGB 10.8*  HCT 35.0*  MCV 88.8  MCH 27.4  MCHC 30.9  RDW 20.2*  PLT 207     Radiology    No results found.  Cardiac Studies   Echocardiogram 05/06/2017: Study Conclusions  - Left ventricle: The cavity size was normal. Wall thickness was increased in a pattern of moderate LVH. Systolic function was normal. The estimated ejection fraction was in the range of 60% to 65%. Wall motion was normal; there were no regional wall motion abnormalities. - Aortic valve: Severely calcified annulus. Trileaflet; severely thickened leaflets. There was moderate stenosis. Lower gradient than expected for moderate AS likely due to low stroke volume index (paradoxical low flow low gradient AS). Morphologically and by AVA VTI there is at least moderate AS. Mean gradient (S): 7 mm Hg. VTI ratio of LVOT to aortic valve: 0.37. Valve area (VTI): 1.17 cm^2. Valve area (Vmax): 0.91 cm^2. - Mitral valve: Moderately calcified annulus. Mildly thickened leaflets . There was moderate regurgitation. The MR is eccentric and may be  underestimated. The MR VC is 0.4 cm. - Left atrium: The atrium was massively dilated. - Right ventricle: The ventricular septum is flattened in systole consistent with RV pressure overload. The cavity size was moderately dilated. Systolic function was moderately reduced. TAPSE: 10 mm . - Right atrium: The atrium was massively dilated. - Atrial septum: No defect or patent foramen ovale was identified. - Tricuspid valve: There was mild-moderate regurgitation. - Pulmonary arteries: Systolic pressure was severely increased. PA peak pressure: 79 mm Hg (S). - Inferior vena cava: The vessel was dilated. The respirophasic diameter changes were blunted (<50%), consistent with elevated central venous pressure. - Pericardium, extracardiac: There is a large left pleural effusion.   Patient Profile     83 y.o. female with history of persistent atrial fibrillation, CAD, pulmonary hypertension, essential hypertension, and cor pulmonale admitted with decompensated diastolic CHF with cor pulmonal for diureses.   Assessment & Plan    1. Acute on chronic diastolic CHF with cor pulmonal with anasarca:  Has diuresed 12,168 cc since admission. Weight is closer to recorded baseline (123-127 lbs), but remains fluid overloaded with abdominal distention and LEE. No plans to diurese further per PCP team. Creatinine is 0.63. She is going to SNF today.   2. Persistent Atrial fib: No longer on telemetry. Heart rate not well controlled per vital signs.  She is on diltiazem XL 180 mg daily, and Eliquis. BP would accommodate an increase in diltiazem dose fore better HR control if needed, however, she had pauses on bisoprolol, which was discontinued.    3. DNR - Family refuses Hospice   Signed, Phill Myron. West Pugh, ANP, [place]   05/19/2017, 9:56 AM    Patient examined chart reviewed Despite diuresis she still has right sided failure with loud AS murmur on exam She is a palliative care patient who  should have home hospice if family would agree. SNF today Patient does Not need further cardiology consults in future  [facility]

## 2017-05-19 NOTE — Progress Notes (Signed)
Pt d/c back SNF via EMS, discharge discussed with pt's son and report called to SNF.

## 2017-05-19 NOTE — Progress Notes (Signed)
Physical Therapy Treatment Patient Details Name: [name] MRN: [id number] DOB: 05/30/1921 Today's Date: 05/19/2017    History of Present Illness [name] is a 83 y.o. female with medical history significant of diastolic heart failure, hypertension, diabetes, atrial fibrillation on Eliquis, hypothyroidism, and CAD presenting with concern for new heart failure after being seen in the cardiology office.  Patient is unaccompanied and was unable to provide a thorough history.  She does not report SOB to me and denies cough, chest pain.    PT Comments    Pt received lying in bed and was agreeable to PT treatment. Pt had visitors at bedside that exited as session began. Performed bed-level therex with pt for strengthening. She required min cues for proper technique and min A for some of the exercises, as illustrated below. PT attempted to get pt EOB but pt declined and wished to stay in bed. Even with gentle encouragement, pt wished to complete bed level exercises only. PT repositioned pt in bed via max-total A. At EOS, pt stated she felt like she was going to fall out of bed. PT assured pt that she was centered in bed and positioned properly and she verbalized understanding/relief. Continue to recommend discharge venue below due to significant deficits in overall function. Will continue to follow acutely.    Follow Up Recommendations  SNF;Supervision/Assistance - 24 hour     Equipment Recommendations  None recommended by PT    Recommendations for Other Services       Precautions / Restrictions Precautions Precautions: Fall Restrictions Weight Bearing Restrictions: No    Mobility  Bed Mobility Overal bed mobility: Needs Assistance Bed Mobility: Rolling Rolling: Min guard         General bed mobility comments: min guard rolling L when placing pillow   Transfers                 General transfer comment: not attempted this date  Ambulation/Gait                  Stairs            Wheelchair Mobility    Modified Rankin (Stroke Patients Only)       Balance       Sitting balance - Comments: pt did not wish to sit EOB this date so sitting balance not assessed.                                    Cognition Arousal/Alertness: Awake/alert Behavior During Therapy: WFL for tasks assessed/performed Overall Cognitive Status: Within Functional Limits for tasks assessed                                        Exercises General Exercises - Lower Extremity Ankle Circles/Pumps: AROM;Both;20 reps;Supine Gluteal Sets: Strengthening;Both;10 reps;Supine Heel Slides: AAROM;Both;10 reps;Supine Hip ABduction/ADduction: AROM;Both;10 reps;Supine Straight Leg Raises: AAROM;Both;10 reps;Supine    General Comments        Pertinent Vitals/Pain Pain Assessment: Faces Faces Pain Scale: Hurts little more Pain Location: BLE Pain Descriptors / Indicators: Pressure Pain Intervention(s): Limited activity within patient's tolerance;Monitored during session;Repositioned    Home Living                      Prior Function  PT Goals (current goals can now be found in the care plan section) Acute Rehab PT Goals Patient Stated Goal: return home Time For Goal Achievement: 05/27/17 Potential to Achieve Goals: Fair    Frequency    Min 3X/week      PT Plan      Co-evaluation              AM-PAC PT "6 Clicks" Daily Activity  Outcome Measure  Difficulty turning over in bed (including adjusting bedclothes, sheets and blankets)?: A Lot Difficulty moving from lying on back to sitting on the side of the bed? : A Lot Difficulty sitting down on and standing up from a chair with arms (e.g., wheelchair, bedside commode, etc,.)?: Unable Help needed moving to and from a bed to chair (including a wheelchair)?: Total Help needed walking in hospital room?: Total Help needed climbing 3-5 steps with  a railing? : Total 6 Click Score: 8    End of Session Equipment Utilized During Treatment: Oxygen Activity Tolerance: Patient tolerated treatment well;Patient limited by fatigue;Patient limited by pain Patient left: in bed;with call bell/phone within reach;with bed alarm set Nurse Communication: Mobility status PT Visit Diagnosis: Unsteadiness on feet (R26.81);Other abnormalities of gait and mobility (R26.89);Muscle weakness (generalized) (M62.81)     Time: 0211-1552 PT Time Calculation (min) (ACUTE ONLY): 21 min  Charges:  $Therapeutic Exercise: 8-22 mins                    G Codes:          Geraldine Solar PT, DPT

## 2017-05-23 ENCOUNTER — Encounter: Payer: Self-pay | Admitting: Adult Health

## 2017-05-23 ENCOUNTER — Ambulatory Visit (INDEPENDENT_AMBULATORY_CARE_PROVIDER_SITE_OTHER): Payer: Medicare Other | Admitting: Adult Health

## 2017-05-23 VITALS — BP 134/80 | HR 95

## 2017-05-23 DIAGNOSIS — I482 Chronic atrial fibrillation: Secondary | ICD-10-CM

## 2017-05-23 DIAGNOSIS — I4821 Permanent atrial fibrillation: Secondary | ICD-10-CM

## 2017-05-23 DIAGNOSIS — I10 Essential (primary) hypertension: Secondary | ICD-10-CM

## 2017-05-23 DIAGNOSIS — I5032 Chronic diastolic (congestive) heart failure: Secondary | ICD-10-CM

## 2017-05-23 DIAGNOSIS — I2510 Atherosclerotic heart disease of native coronary artery without angina pectoris: Secondary | ICD-10-CM

## 2017-05-23 NOTE — Progress Notes (Signed)
Cardiology Office Note   Date:  05/23/2017   ID:  GLORIANA [name], DOB 10-10-1920, MRN [id number]  PCP:  Hilbert Corrigan, MD  Cardiologist:  Jamesetta So Dr. Bronson Ing, M.D.  Chief Complaint  Patient presents with  . Congestive Heart Failure  . Atrial Fibrillation  . Hospitalization Follow-up      History of Present Illness: [name] is a 83 y.o. female who presents for posthospitalization follow-up after admission for acute on chronic diastolic heart failure, cor pulmonale with anasarca, persistent atrial fibrillation, the patient was diuresed 12.168 L, she was continued on ELIQUIS and diltiazem. She had been on bisoprolol but this caused bradycardia and pauses.[name] was called in to follow patient at skilled nursing facility. She has been made a DO NOT RESUSCITATE. Discharge weight 138 lbs.   She remains frail. Came from SNF without O2 on. She feels better. Not eating well except for breakfast. Uncertain if they are weighing her. Unable to weigh here in the office. She offers no complaints.    Past Medical History:  Diagnosis Date  . A-fib ([place])   . Arteriosclerotic cardiovascular disease (ASCVD)    a. Cath 12/2002 showed high grade LAD stenosis treated with DES, also had residual CAD treated medically at that time with 50% + 70-80% mid-septal perforator, 80% D1, 70% OM1, 50% ostial RCA, 25% mRCA.  . Bronchitis    history  . Carcinoma of breast ([place])    left masectomy in 1995  . CHF (congestive heart failure) ([place])   . Chronic diastolic heart failure ([place]) 11/21/2009  . Chronic hoarseness   . CKD (chronic kidney disease), stage II    per historical labs for age, weight, Cr  . CVD (cerebrovascular disease)    plaque w/o focal disease in 2006; h/o CVA  . Dementia   . Dementia   . Diabetes mellitus type II    no insulin   . Edema   . Herpes zoster   . Hyperlipidemia   . Hypertension   . Hyponatremia   . Hypothyroid   . Lower extremity edema 08/20/2011  .  Malignant neoplasm of breast (female) ([place])   . MVP (mitral valve prolapse)    moderate; with moderate MR  . On home O2   . Osteoporosis   . Peripheral neuropathy   . Permanent atrial fibrillation ([place])   . PVD (peripheral vascular disease) ([facility])    ABIs of 0.64 and 0.59, right and left leg in 2009  . PVD (peripheral vascular disease) ([place])   . 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 Shingles   . Ulcer    left lower leg  . Varicose veins of legs 08/20/2011    Past Surgical History:  Procedure Laterality Date  . ABDOMINAL HYSTERECTOMY     abd?  . CARDIAC SURGERY    . CATARACT EXTRACTION, BILATERAL    . CHOLECYSTECTOMY  2006  . COLONOSCOPY  Date unknown  . CORONARY ANGIOPLASTY WITH STENT PLACEMENT    . left masectomy  1995  . ORIF of left hip  05/22/06   [name]     Current Outpatient Prescriptions  Medication Sig Dispense Refill  . apixaban (ELIQUIS) 2.5 MG TABS tablet Take 1 tablet (2.5 mg total) by mouth 2 (two) times daily. 60 tablet 2  . atorvastatin (LIPITOR) 20 MG tablet Take 20 mg by mouth daily.    . calcium carbonate (TUMS - DOSED IN MG ELEMENTAL CALCIUM) 500 MG chewable tablet Chew 1 tablet by mouth 3 (  three) times daily before meals.    . cholecalciferol (VITAMIN D) 1000 units tablet Take 1,000 Units by mouth daily.    [name] diltiazem (CARDIZEM CD) 240 MG 24 hr capsule Take 1 capsule (240 mg total) by mouth daily.    [name] gabapentin (NEURONTIN) 100 MG capsule Take 200 mg by mouth every 8 (eight) hours.     [name] HYDROcodone-acetaminophen (NORCO/VICODIN) 5-325 MG tablet Take 1-2 tablets by mouth every 4 (four) hours as needed for moderate pain. 30 tablet 0  . levalbuterol (XOPENEX) 0.63 MG/3ML nebulizer solution Inhale 3 mLs into the lungs 3 (three) times daily as needed for wheezing or cough. Via nebulizer    . levothyroxine (SYNTHROID, LEVOTHROID) 25 MCG tablet Take 25 mcg by mouth every evening.     . mirtazapine (REMERON) 7.5 MG tablet Take 7.5 mg by mouth at bedtime.     . Amino  Acids-Protein Hydrolys (FEEDING SUPPLEMENT, PRO-STAT SUGAR FREE 64,) LIQD Take 30 mLs by mouth 3 (three) times daily with meals.    . ondansetron (ZOFRAN) 4 MG tablet Take 1 mg by mouth every 6 (six) hours as needed for nausea or vomiting.    . polyethylene glycol (MIRALAX / GLYCOLAX) packet Take 17 g by mouth daily.    . potassium chloride SA (K-DUR,KLOR-CON) 20 MEQ tablet Take 2 tablets (40 mEq total) by mouth daily.    . Probiotic Product (PROBIOTIC FORMULA PO) Take 1 capsule by mouth daily.    . sodium phosphate (FLEET) 7-19 GM/118ML ENEM Place 133 mLs (1 enema total) rectally daily as needed for mild constipation or severe constipation.  0  . torsemide (DEMADEX) 20 MG tablet Take 2 tablets (40 mg total) by mouth daily.     No current facility-administered medications for this visit.     Allergies:   Patient has no known allergies.    Social History:  The patient  reports that she has never smoked. She has never used smokeless tobacco. She reports that she does not drink alcohol or use drugs.   Family History:  The patient's family history includes Diabetes in her mother.    ROS: All other systems are reviewed and negative. Unless otherwise mentioned in H&P    PHYSICAL EXAM: VS:  BP 134/80   Pulse 95   SpO2 94%  , BMI There is no height or weight on file to calculate BMI. GEN: Well nourished, well developed, in no acute distress  HEENT: normal  Neck: no JVD, carotid bruits, or masses Cardiac: RRR; tachycardic, no murmurs, rubs, or gallops, mild dependent edema  Respiratory:  clear to auscultation bilaterally, normal work of breathing GI: soft, nontender, nondistended, + BS MS: no deformity or atrophy Painful to palpation.  Skin: warm and dry, no rash. Venous statis skin changes.  Neuro:  Strength and sensation are intact Psych: euthymic mood, full affect    Recent Labs: 05/05/2017: B Natriuretic Peptide 1,982.0 05/06/2017: TSH 2.692 05/11/2017: Magnesium 1.9 05/13/2017:  Hemoglobin 10.8; Platelets 207 05/17/2017: ALT 30 05/19/2017: BUN 38; Creatinine, Ser 0.63; Potassium 4.5; Sodium 136    Lipid Panel    Component Value Date/Time   CHOL  10/19/2010 0615    120        ATP III CLASSIFICATION:  <200     mg/dL   Desirable  200-239  mg/dL   Borderline High  >=240    mg/dL   High          TRIG 74 10/19/2010 0615   HDL 39 (L)  10/19/2010 0615   CHOLHDL 3.1 10/19/2010 0615   VLDL 15 10/19/2010 0615   LDLCALC  10/19/2010 0615    66        Total Cholesterol/HDL:CHD Risk Coronary Heart Disease Risk Table                     Men   Women  1/2 Average Risk   3.4   3.3  Average Risk       5.0   4.4  2 X Average Risk   9.6   7.1  3 X Average Risk  23.4   11.0        Use the calculated Patient Ratio above and the CHD Risk Table to determine the patient's CHD Risk.        ATP III CLASSIFICATION (LDL):  <100     mg/dL   Optimal  100-129  mg/dL   Near or Above                    Optimal  130-159  mg/dL   Borderline  160-189  mg/dL   High  >190     mg/dL   Very High      Wt Readings from Last 3 Encounters:  05/19/17 138 lb 14.2 oz (63 kg)  05/05/17 121 lb (54.9 kg)  03/07/17 123 lb 7.3 oz (56 kg)     Other studies Reviewed: Echocardiogram 2017-05-17 Left ventricle: The cavity size was normal. Wall thickness was   increased in a pattern of moderate LVH. Systolic function was   normal. The estimated ejection fraction was in the range of 60%   to 65%. Wall motion was normal; there were no regional wall   motion abnormalities. - Aortic valve: Severely calcified annulus. Trileaflet; severely   thickened leaflets. There was moderate stenosis. Lower gradient   than expected for moderate AS likely due to low stroke volume   index (paradoxical low flow low gradient AS). Morphologically and   by AVA VTI there is at least moderate AS. Mean gradient (S): 7 mm   Hg. VTI ratio of LVOT to aortic valve: 0.37. Valve area (VTI):   1.17 cm^2. Valve area (Vmax):  0.91 cm^2. - Mitral valve: Moderately calcified annulus. Mildly thickened   leaflets . There was moderate regurgitation. The MR is eccentric   and may be underestimated. The MR VC is 0.4 cm. - Left atrium: The atrium was massively dilated. - Right ventricle: The ventricular septum is flattened in systole   consistent with RV pressure overload. The cavity size was   moderately dilated. Systolic function was moderately reduced.   TAPSE: 10 mm . - Right atrium: The atrium was massively dilated. - Atrial septum: No defect or patent foramen ovale was identified. - Tricuspid valve: There was mild-moderate regurgitation. - Pulmonary arteries: Systolic pressure was severely increased. PA   peak pressure: 79 mm Hg (S). - Inferior vena cava: The vessel was dilated. The respirophasic   diameter changes were blunted (< 50%), consistent with elevated   central venous pressure. - Pericardium, extracardiac: There is a large left pleural   effusion.  ASSESSMENT AND PLAN:  1.  Chronic Diastolic CHF: Over 12 liters were taken off during hospitalization. She appears euvolemic on exam. Uncertain of weights. I have asked SNF to weigh her daily and to report > 5 lbs weight gain. She is to continue on low sodium diet. No changes in medication regimen at this time.   2.  Atrial fib: She did not tolerate bisoprolol during admission causing bradycardia and pauses. She is slightly tachycardic today. Remains on diltiazem  240 mg daily. BP is stable. Will follow. May need to adjust CCB slightly to avoid elevated HR, She is a little anxious complaining of pain in her legs bilaterally. Continue Eliquis.   3. Venous statis: I have ordered TED hose for comfort and compression to help with dependent edema.    Current medicines are reviewed at length with the patient today.    Labs/ tests ordered today include:  Phill Myron. West Pugh, ANP, AACC   05/23/2017 4:07 PM    [facility] [address], [place], [place] [address] Phone: [phone]; Fax: [phone]

## 2017-05-23 NOTE — Patient Instructions (Signed)
Medication Instructions:  Your physician recommends that you continue on your current medications as directed. Please refer to the Current Medication list given to you today.   Labwork: NONE   Testing/Procedures: NONE  Follow-Up: Your physician recommends that you schedule a follow-up appointment in: 1 Month   Any Other Special Instructions Will Be Listed Below (If Applicable). Call office for 5 lb or more wt gain.  You have been given a Rx for Compression Hose     If you need a refill on your cardiac medications before your next appointment, please call your pharmacy.  Thank you for choosing [facility]!

## 2017-06-17 ENCOUNTER — Telehealth: Payer: Self-pay | Admitting: Internal Medicine

## 2017-06-17 NOTE — Telephone Encounter (Signed)
Spoke with heather, she reports the patient was given 40 mg furosemide IM today and they are asking for other instructions. Order given for additional 40 mg of demadex tomorrow if weight is still up.

## 2017-06-17 NOTE — Telephone Encounter (Signed)
New message     Pt c/o swelling: STAT is pt has developed SOB within 24 hours  1) How much weight have you gained and in what time span?  10/26 132lb and 11/6 137.4   2) If swelling, where is the swelling located? One leg is severely swollen   3) Are you currently taking a fluid pill?  Yes 40 mg lasik  4) Are you currently SOB?  no  5) Do you have a log of your daily weights (if so, list)?  No they are not pt refused to be weighed  6) Have you gained 3 pounds in a day or 5 pounds in a week? Nurse feels she has   7) Have you traveled recently?  No

## 2017-06-20 NOTE — Progress Notes (Signed)
Cardiology Office Note   Date:  06/23/2017   ID:  [name], DOB 12/23/1920, MRN [id number]  PCP:  Hilbert Corrigan, MD  Cardiologist: Kate Sable, MD.  Chief Complaint  Patient presents with  . Congestive Heart Failure  . Atrial Fibrillation    History of Present Illness: [name] is a 83 y.o. female who presents for ongoing assessment and management of chronic diastolic heart failure, persistent atrial fibrillation, cor pulmonale with anasarca on recent hospitalization in December 2018, she remained on Eliquis.  She became bradycardic on bisoprolol and therefore no beta blockers have been ordered.  The patient is a resident of a skilled nursing facility.  On last office visit she was stable but had not been eating well.  TED hose were ordered for chronic venous stasis.  On 06/17/2017 the patient was given an extra dose of Lasix 40 mg IM, due to weight gain 5 pounds.  She was also to receive an additional dose of torsemide if her weight has not decreased with the IM Lasix.  She comes today  lower extremity edema abdominal pain, she is unable to express symptoms.  She is a very poor historian however, with dementia.  She is currently a resident of [facility].  (The staff just dropped her off in her wheelchair-no family members available).  Due to frailty, she was not weighed here in our office.  She was not able to stand and remains in a wheelchair.  Past Medical History:  Diagnosis Date  . A-fib ([place])   . Arteriosclerotic cardiovascular disease (ASCVD)    a. Cath 12/2002 showed high grade LAD stenosis treated with DES, also had residual CAD treated medically at that time with 50% + 70-80% mid-septal perforator, 80% D1, 70% OM1, 50% ostial RCA, 25% mRCA.  . Bronchitis    history  . Carcinoma of breast ([place])    left masectomy in 1995  . CHF (congestive heart failure) ([place])   . Chronic diastolic heart failure ([place]) 11/21/2009  . Chronic hoarseness   . CKD (chronic kidney  disease), stage II    per historical labs for age, weight, Cr  . CVD (cerebrovascular disease)    plaque w/o focal disease in 2006; h/o CVA  . Dementia   . Dementia   . Diabetes mellitus type II    no insulin   . Edema   . Herpes zoster   . Hyperlipidemia   . Hypertension   . Hyponatremia   . Hypothyroid   . Lower extremity edema 08/20/2011  . Malignant neoplasm of breast (female) ([place])   . MVP (mitral valve prolapse)    moderate; with moderate MR  . On home O2   . Osteoporosis   . Peripheral neuropathy   . Permanent atrial fibrillation ([place])   . PVD (peripheral vascular disease) ([facility])    ABIs of 0.64 and 0.59, right and left leg in 2009  . PVD (peripheral vascular disease) ([place])   . 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 Shingles   . Ulcer    left lower leg  . Varicose veins of legs 08/20/2011    Past Surgical History:  Procedure Laterality Date  . ABDOMINAL HYSTERECTOMY     abd?  . CARDIAC SURGERY    . CATARACT EXTRACTION, BILATERAL    . CHOLECYSTECTOMY  2006  . COLONOSCOPY  Date unknown  . CORONARY ANGIOPLASTY WITH STENT PLACEMENT    . left masectomy  1995  . ORIF of left hip  05/22/06   [name]     Current Outpatient Medications  Medication Sig Dispense Refill  . apixaban (ELIQUIS) 2.5 MG TABS tablet Take 1 tablet (2.5 mg total) by mouth 2 (two) times daily. 60 tablet 2  . atorvastatin (LIPITOR) 20 MG tablet Take 20 mg by mouth daily.    . calcium carbonate (TUMS - DOSED IN MG ELEMENTAL CALCIUM) 500 MG chewable tablet Chew 1 tablet by mouth 3 (three) times daily before meals.    . cholecalciferol (VITAMIN D) 1000 units tablet Take 1,000 Units by mouth daily.    Marland Kitchen diltiazem (CARDIZEM CD) 240 MG 24 hr capsule Take 1 capsule (240 mg total) by mouth daily.    Marland Kitchen gabapentin (NEURONTIN) 100 MG capsule Take 200 mg by mouth every 8 (eight) hours.     Marland Kitchen HYDROcodone-acetaminophen (NORCO/VICODIN) 5-325 MG tablet Take 1-2 tablets by mouth every 4 (four) hours as needed for moderate  pain. 30 tablet 0  . levalbuterol (XOPENEX) 0.63 MG/3ML nebulizer solution Inhale 3 mLs into the lungs 3 (three) times daily as needed for wheezing or cough. Via nebulizer    . levofloxacin (LEVAQUIN) 500 MG tablet     . levothyroxine (SYNTHROID, LEVOTHROID) 25 MCG tablet Take 25 mcg by mouth every evening.     . mirtazapine (REMERON) 7.5 MG tablet Take 7.5 mg by mouth at bedtime.     . ondansetron (ZOFRAN) 4 MG tablet Take 1 mg by mouth every 6 (six) hours as needed for nausea or vomiting.    . polyethylene glycol (MIRALAX / GLYCOLAX) packet Take 17 g by mouth daily.    . Probiotic Product (PROBIOTIC FORMULA PO) Take 1 capsule by mouth daily.    . sodium phosphate (FLEET) 7-19 GM/118ML ENEM Place 133 mLs (1 enema total) rectally daily as needed for mild constipation or severe constipation.  0  . torsemide (DEMADEX) 20 MG tablet Take 2 tablets (40 mg total) by mouth daily.    . Amino Acids-Protein Hydrolys (FEEDING SUPPLEMENT, PRO-STAT SUGAR FREE 64,) LIQD Take 30 mLs by mouth 3 (three) times daily with meals.     No current facility-administered medications for this visit.     Allergies:   Patient has no known allergies.    Social History:  The patient  reports that  has never smoked. she has never used smokeless tobacco. She reports that she does not drink alcohol or use drugs.   Family History:  The patient's family history includes Diabetes in her mother.    ROS: All other systems are reviewed and negative. Unless otherwise mentioned in H&P    PHYSICAL EXAM: VS:  BP 98/68   Pulse 78   SpO2 97%  , BMI There is no height or weight on file to calculate BMI. GEN: Well nourished, well developed, in no acute distress  HEENT: normal  Neck: no JVD, carotid bruits, or masses Cardiac: IRRR; bradycardia, 1/6 systolic murmur murmurs, rubs, or gallops 2+ pitting pretibial  edema  Respiratory:  clear to auscultation bilaterally, normal work of breathing GI: soft, nontender, nondistended, +  BS MS: no deformity or atrophy Venous stasis skin discoloration with mild cyanosis of the left LE.  Left mastectomy is noted. Skin: warm and dry, no rash Neuro:  Severely deconditioned. Poor historian   EKG:The ekg ordered today demonstrates: Atrial fib with slow ventricular response. !.5 second pause noted.    Recent Labs: 05/05/2017: B Natriuretic Peptide 1,982.0 05/06/2017: TSH 2.692 05/11/2017: Magnesium 1.9 05/13/2017: Hemoglobin 10.8; Platelets 207 05/17/2017: ALT  30 05/19/2017: BUN 38; Creatinine, Ser 0.63; Potassium 4.5; Sodium 136    Lipid Panel    Component Value Date/Time   CHOL  10/19/2010 0615    120        ATP III CLASSIFICATION:  <200     mg/dL   Desirable  200-239  mg/dL   Borderline High  >=240    mg/dL   High          TRIG 74 10/19/2010 0615   HDL 39 (L) 10/19/2010 0615   CHOLHDL 3.1 10/19/2010 0615   VLDL 15 10/19/2010 0615   LDLCALC  10/19/2010 0615    66        Total Cholesterol/HDL:CHD Risk Coronary Heart Disease Risk Table                     Men   Women  1/2 Average Risk   3.4   3.3  Average Risk       5.0   4.4  2 X Average Risk   9.6   7.1  3 X Average Risk  23.4   11.0        Use the calculated Patient Ratio above and the CHD Risk Table to determine the patient's CHD Risk.        ATP III CLASSIFICATION (LDL):  <100     mg/dL   Optimal  100-129  mg/dL   Near or Above                    Optimal  130-159  mg/dL   Borderline  160-189  mg/dL   High  >190     mg/dL   Very High      Wt Readings from Last 3 Encounters:  05/19/17 138 lb 14.2 oz (63 kg)  05/05/17 121 lb (54.9 kg)  03/07/17 123 lb 7.3 oz (56 kg)      Other studies Reviewed: Echocardiogram 2017/05/13  Left ventricle: The cavity size was normal. Wall thickness was   increased in a pattern of moderate LVH. Systolic function was   normal. The estimated ejection fraction was in the range of 60%   to 65%. Wall motion was normal; there were no regional wall   motion  abnormalities. - Aortic valve: Severely calcified annulus. Trileaflet; severely   thickened leaflets. There was moderate stenosis. Lower gradient   than expected for moderate AS likely due to low stroke volume   index (paradoxical low flow low gradient AS). Morphologically and   by AVA VTI there is at least moderate AS. Mean gradient (S): 7 mm   Hg. VTI ratio of LVOT to aortic valve: 0.37. Valve area (VTI):   1.17 cm^2. Valve area (Vmax): 0.91 cm^2. - Mitral valve: Moderately calcified annulus. Mildly thickened   leaflets . There was moderate regurgitation. The MR is eccentric   and may be underestimated. The MR VC is 0.4 cm. - Left atrium: The atrium was massively dilated. - Right ventricle: The ventricular septum is flattened in systole   consistent with RV pressure overload. The cavity size was   moderately dilated. Systolic function was moderately reduced.   TAPSE: 10 mm . - Right atrium: The atrium was massively dilated. - Atrial septum: No defect or patent foramen ovale was identified. - Tricuspid valve: There was mild-moderate regurgitation. - Pulmonary arteries: Systolic pressure was severely increased. PA   peak pressure: 79 mm Hg (S). - Inferior vena cava: The vessel was dilated. The respirophasic  diameter changes were blunted (< 50%), consistent with elevated   central venous pressure. - Pericardium, extracardiac: There is a large left pleural   effusion.  ASSESSMENT AND PLAN:  1.  Acute on chronic diastolic heart failure: Significant fluid retention in the lower extremities with skin discoloration and mild cyanosis noted in the left lower leg.  Legs are very painful.  The patient is not able to ambulate or stand, due to pain in legs.  Unable to weigh patient uncertain at this time of status concerning weight gain or loss with IM Lasix as discussed above.  I have discussed this with Dr. Bronson Ing, who is on site today and is the patient's primary cardiologist.  We will  admit this patient for further evaluation, diuresis, labs, to include BNP, CBC, BMET, and magnesium.  She will likely need IV diuretics.  2.  Atrial fibrillation: Patient had a slow ventricular response with a 1.5-second pause.  She is on diltiazem 240 mg daily.  We decreased to 120 mg daily beginning in the a.m.  Continue Xarelto.  3.  Diabetic neuropathy: No acute pain with palpation of her legs abdomen.  She is very frail and cries out with any pressure on her arms legs or torso.  4.  Coronary artery disease: Stent to unknown artery.  Continue medical therapy.  5.  Significant frailty with dementia: Severe deconditioning wheelchair-bound.  Please evaluate for pressure ulcers and decubiti.  DNR status will need to be addressed.  Current medicines are reviewed at length with the patient today.    Labs/ tests ordered today include: Admit, follow on consultation.  Phill Myron. West Pugh, ANP, AACC   06/23/2017 4:09 PM    [facility] [address] [place], [place] [address] Phone: [phone]; Fax: [phone]

## 2017-06-23 ENCOUNTER — Emergency Department (HOSPITAL_COMMUNITY): Payer: Medicare Other

## 2017-06-23 ENCOUNTER — Encounter (HOSPITAL_COMMUNITY): Payer: Self-pay | Admitting: Cardiology

## 2017-06-23 ENCOUNTER — Other Ambulatory Visit: Payer: Self-pay

## 2017-06-23 ENCOUNTER — Encounter: Payer: Self-pay | Admitting: Adult Health

## 2017-06-23 ENCOUNTER — Ambulatory Visit (INDEPENDENT_AMBULATORY_CARE_PROVIDER_SITE_OTHER): Payer: Medicare Other | Admitting: Adult Health

## 2017-06-23 ENCOUNTER — Inpatient Hospital Stay (HOSPITAL_COMMUNITY)
Admission: EM | Admit: 2017-06-23 | Discharge: 2017-06-27 | DRG: 291 | Disposition: A | Discharge status: Skilled Nursing Facility | Payer: Medicare Other | Attending: Internal Medicine | Admitting: Internal Medicine

## 2017-06-23 VITALS — BP 98/68 | HR 78

## 2017-06-23 DIAGNOSIS — I5032 Chronic diastolic (congestive) heart failure: Secondary | ICD-10-CM

## 2017-06-23 DIAGNOSIS — L89154 Pressure ulcer of sacral region, stage 4: Secondary | ICD-10-CM | POA: Diagnosis not present

## 2017-06-23 DIAGNOSIS — E1122 Type 2 diabetes mellitus with diabetic chronic kidney disease: Secondary | ICD-10-CM | POA: Diagnosis present

## 2017-06-23 DIAGNOSIS — E1151 Type 2 diabetes mellitus with diabetic peripheral angiopathy without gangrene: Secondary | ICD-10-CM | POA: Diagnosis present

## 2017-06-23 DIAGNOSIS — E1140 Type 2 diabetes mellitus with diabetic neuropathy, unspecified: Secondary | ICD-10-CM | POA: Diagnosis present

## 2017-06-23 DIAGNOSIS — Z9842 Cataract extraction status, left eye: Secondary | ICD-10-CM

## 2017-06-23 DIAGNOSIS — Z6826 Body mass index (BMI) 26.0-26.9, adult: Secondary | ICD-10-CM

## 2017-06-23 DIAGNOSIS — I5033 Acute on chronic diastolic (congestive) heart failure: Secondary | ICD-10-CM | POA: Diagnosis present

## 2017-06-23 DIAGNOSIS — I480 Paroxysmal atrial fibrillation: Secondary | ICD-10-CM

## 2017-06-23 DIAGNOSIS — L89159 Pressure ulcer of sacral region, unspecified stage: Secondary | ICD-10-CM | POA: Diagnosis present

## 2017-06-23 DIAGNOSIS — I2781 Cor pulmonale (chronic): Secondary | ICD-10-CM | POA: Diagnosis present

## 2017-06-23 DIAGNOSIS — D649 Anemia, unspecified: Secondary | ICD-10-CM | POA: Diagnosis present

## 2017-06-23 DIAGNOSIS — I481 Persistent atrial fibrillation: Secondary | ICD-10-CM | POA: Diagnosis present

## 2017-06-23 DIAGNOSIS — Z9841 Cataract extraction status, right eye: Secondary | ICD-10-CM

## 2017-06-23 DIAGNOSIS — Z833 Family history of diabetes mellitus: Secondary | ICD-10-CM

## 2017-06-23 DIAGNOSIS — I083 Combined rheumatic disorders of mitral, aortic and tricuspid valves: Secondary | ICD-10-CM | POA: Diagnosis present

## 2017-06-23 DIAGNOSIS — G3183 Dementia with Lewy bodies: Secondary | ICD-10-CM | POA: Diagnosis present

## 2017-06-23 DIAGNOSIS — R627 Adult failure to thrive: Secondary | ICD-10-CM | POA: Diagnosis present

## 2017-06-23 DIAGNOSIS — Z955 Presence of coronary angioplasty implant and graft: Secondary | ICD-10-CM

## 2017-06-23 DIAGNOSIS — Z853 Personal history of malignant neoplasm of breast: Secondary | ICD-10-CM

## 2017-06-23 DIAGNOSIS — E039 Hypothyroidism, unspecified: Secondary | ICD-10-CM | POA: Diagnosis present

## 2017-06-23 DIAGNOSIS — N182 Chronic kidney disease, stage 2 (mild): Secondary | ICD-10-CM | POA: Diagnosis present

## 2017-06-23 DIAGNOSIS — Z792 Long term (current) use of antibiotics: Secondary | ICD-10-CM

## 2017-06-23 DIAGNOSIS — I5030 Unspecified diastolic (congestive) heart failure: Secondary | ICD-10-CM | POA: Diagnosis not present

## 2017-06-23 DIAGNOSIS — I509 Heart failure, unspecified: Secondary | ICD-10-CM

## 2017-06-23 DIAGNOSIS — Z7189 Other specified counseling: Secondary | ICD-10-CM

## 2017-06-23 DIAGNOSIS — Z9049 Acquired absence of other specified parts of digestive tract: Secondary | ICD-10-CM

## 2017-06-23 DIAGNOSIS — I2729 Other secondary pulmonary hypertension: Secondary | ICD-10-CM | POA: Diagnosis present

## 2017-06-23 DIAGNOSIS — F0280 Dementia in other diseases classified elsewhere without behavioral disturbance: Secondary | ICD-10-CM | POA: Diagnosis present

## 2017-06-23 DIAGNOSIS — Z8673 Personal history of transient ischemic attack (TIA), and cerebral infarction without residual deficits: Secondary | ICD-10-CM

## 2017-06-23 DIAGNOSIS — E875 Hyperkalemia: Secondary | ICD-10-CM

## 2017-06-23 DIAGNOSIS — L8990 Pressure ulcer of unspecified site, unspecified stage: Secondary | ICD-10-CM | POA: Diagnosis present

## 2017-06-23 DIAGNOSIS — Z9981 Dependence on supplemental oxygen: Secondary | ICD-10-CM

## 2017-06-23 DIAGNOSIS — Z7901 Long term (current) use of anticoagulants: Secondary | ICD-10-CM

## 2017-06-23 DIAGNOSIS — E871 Hypo-osmolality and hyponatremia: Secondary | ICD-10-CM | POA: Diagnosis present

## 2017-06-23 DIAGNOSIS — I495 Sick sinus syndrome: Secondary | ICD-10-CM | POA: Diagnosis not present

## 2017-06-23 DIAGNOSIS — I38 Endocarditis, valve unspecified: Secondary | ICD-10-CM | POA: Diagnosis not present

## 2017-06-23 DIAGNOSIS — I2510 Atherosclerotic heart disease of native coronary artery without angina pectoris: Secondary | ICD-10-CM | POA: Diagnosis present

## 2017-06-23 DIAGNOSIS — I4891 Unspecified atrial fibrillation: Secondary | ICD-10-CM | POA: Diagnosis not present

## 2017-06-23 DIAGNOSIS — I482 Chronic atrial fibrillation: Secondary | ICD-10-CM | POA: Diagnosis present

## 2017-06-23 DIAGNOSIS — Z66 Do not resuscitate: Secondary | ICD-10-CM | POA: Diagnosis present

## 2017-06-23 DIAGNOSIS — I361 Nonrheumatic tricuspid (valve) insufficiency: Secondary | ICD-10-CM | POA: Diagnosis not present

## 2017-06-23 DIAGNOSIS — M1711 Unilateral primary osteoarthritis, right knee: Secondary | ICD-10-CM | POA: Diagnosis present

## 2017-06-23 DIAGNOSIS — R011 Cardiac murmur, unspecified: Secondary | ICD-10-CM | POA: Diagnosis present

## 2017-06-23 DIAGNOSIS — I8390 Asymptomatic varicose veins of unspecified lower extremity: Secondary | ICD-10-CM | POA: Diagnosis present

## 2017-06-23 DIAGNOSIS — E785 Hyperlipidemia, unspecified: Secondary | ICD-10-CM | POA: Diagnosis present

## 2017-06-23 DIAGNOSIS — Z793 Long term (current) use of hormonal contraceptives: Secondary | ICD-10-CM

## 2017-06-23 DIAGNOSIS — L89620 Pressure ulcer of left heel, unstageable: Secondary | ICD-10-CM | POA: Diagnosis present

## 2017-06-23 DIAGNOSIS — Z79891 Long term (current) use of opiate analgesic: Secondary | ICD-10-CM

## 2017-06-23 DIAGNOSIS — I130 Hypertensive heart and chronic kidney disease with heart failure and stage 1 through stage 4 chronic kidney disease, or unspecified chronic kidney disease: Secondary | ICD-10-CM | POA: Diagnosis present

## 2017-06-23 DIAGNOSIS — E43 Unspecified severe protein-calorie malnutrition: Secondary | ICD-10-CM | POA: Diagnosis present

## 2017-06-23 DIAGNOSIS — Z515 Encounter for palliative care: Secondary | ICD-10-CM | POA: Diagnosis not present

## 2017-06-23 DIAGNOSIS — Z90710 Acquired absence of both cervix and uterus: Secondary | ICD-10-CM

## 2017-06-23 DIAGNOSIS — M810 Age-related osteoporosis without current pathological fracture: Secondary | ICD-10-CM | POA: Diagnosis present

## 2017-06-23 DIAGNOSIS — Z79899 Other long term (current) drug therapy: Secondary | ICD-10-CM

## 2017-06-23 LAB — COMPREHENSIVE METABOLIC PANEL WITH GFR
ALT: 14 U/L (ref 14–54)
AST: 25 U/L (ref 15–41)
Albumin: 2.3 g/dL — ABNORMAL LOW (ref 3.5–5.0)
Alkaline Phosphatase: 71 U/L (ref 38–126)
Anion gap: 9 (ref 5–15)
BUN: 29 mg/dL — ABNORMAL HIGH (ref 6–20)
CO2: 24 mmol/L (ref 22–32)
Calcium: 8.4 mg/dL — ABNORMAL LOW (ref 8.9–10.3)
Chloride: 100 mmol/L — ABNORMAL LOW (ref 101–111)
Creatinine, Ser: 0.85 mg/dL (ref 0.44–1.00)
GFR calc Af Amer: >60 mL/min (ref >60)
GFR calc non Af Amer: 57 mL/min — ABNORMAL LOW (ref >60)
Glucose, Bld: 115 mg/dL — ABNORMAL HIGH (ref 65–99)
Potassium: 5.4 mmol/L — ABNORMAL HIGH (ref 3.5–5.1)
Sodium: 133 mmol/L — ABNORMAL LOW (ref 135–145)
Total Bilirubin: 0.7 mg/dL (ref 0.3–1.2)
Total Protein: 5.4 g/dL — ABNORMAL LOW (ref 6.5–8.1)

## 2017-06-23 LAB — CBC WITH DIFFERENTIAL/PLATELET
BASOS ABS: 0 10*3/uL (ref 0.0–0.1)
BASOS PCT: 0 %
EOS PCT: 2 %
Eosinophils Absolute: 0.1 10*3/uL (ref 0.0–0.7)
HCT: 35.1 % — ABNORMAL LOW (ref 36.0–46.0)
Hemoglobin: 10.7 g/dL — ABNORMAL LOW (ref 12.0–15.0)
LYMPHS PCT: 21 %
Lymphs Abs: 1.6 10*3/uL (ref 0.7–4.0)
MCH: 26.6 pg (ref 26.0–34.0)
MCHC: 30.5 g/dL (ref 30.0–36.0)
MCV: 87.3 fL (ref 78.0–100.0)
MONO ABS: 0.6 10*3/uL (ref 0.1–1.0)
Monocytes Relative: 8 %
Neutro Abs: 5.1 10*3/uL (ref 1.7–7.7)
Neutrophils Relative %: 69 %
PLATELETS: 241 10*3/uL (ref 150–400)
RBC: 4.02 MIL/uL (ref 3.87–5.11)
RDW: 17.5 % — AB (ref 11.5–15.5)
WBC: 7.4 10*3/uL (ref 4.0–10.5)

## 2017-06-23 LAB — TROPONIN I: Troponin I: <0.03 ng/mL (ref <0.03)

## 2017-06-23 LAB — BRAIN NATRIURETIC PEPTIDE: B Natriuretic Peptide: 1232 pg/mL — ABNORMAL HIGH (ref 0.0–100.0)

## 2017-06-23 MED ORDER — SODIUM BICARBONATE 8.4 % IV SOLN
50.0000 meq | Freq: Once | INTRAVENOUS | Status: AC
  Administered 2017-06-23: 50 meq via INTRAVENOUS
  Filled 2017-06-23: qty 50

## 2017-06-23 MED ORDER — SODIUM CHLORIDE 0.9 % IV SOLN
1.0000 g | Freq: Once | INTRAVENOUS | Status: AC
  Administered 2017-06-23: 1 g via INTRAVENOUS
  Filled 2017-06-23: qty 10

## 2017-06-23 MED ORDER — SODIUM POLYSTYRENE SULFONATE 15 GM/60ML PO SUSP
15.0000 g | Freq: Once | ORAL | Status: AC
  Administered 2017-06-23: 15 g via ORAL
  Filled 2017-06-23: qty 60

## 2017-06-23 MED ORDER — PRO-STAT SUGAR FREE PO LIQD
30.0000 mL | Freq: Two times a day (BID) | ORAL | Status: DC
  Administered 2017-06-24 (×2): 30 mL via ORAL
  Filled 2017-06-23 (×5): qty 30

## 2017-06-23 NOTE — ED Notes (Signed)
Per [facility] staff pt was sent from cardiologist office & they were not advised why she was being sent here. They are attempting to call cardiologist now. Will let us know if they find out anything.

## 2017-06-23 NOTE — ED Triage Notes (Signed)
Pt here from [facility].  Pt unable to tell me why she is here.  Unable to get staff on phone at nursing center for report.

## 2017-06-23 NOTE — ED Provider Notes (Signed)
[facility] EMERGENCY DEPARTMENT Provider Note   CSN: [id number] Arrival date & time: 06/23/17  1617     History   Chief Complaint Chief Complaint  Patient presents with  . Altered Mental Status    HPI SOLEI WUBBEN is a 83 y.o. female.  Patient has been having swelling in her legs and abdomen and was seen by cardiology today and they felt she needed to be admitted and get diuresed   The history is provided by a caregiver. No language interpreter was used.  Weakness  Primary symptoms include no focal weakness. This is a recurrent problem. The current episode started more than 2 days ago. The problem has not changed since onset.There was no focality noted. There has been no fever. Associated symptoms include shortness of breath. Pertinent negatives include no chest pain and no headaches.    Past Medical History:  Diagnosis Date  . A-fib ([place])   . Arteriosclerotic cardiovascular disease (ASCVD)    a. Cath 12/2002 showed high grade LAD stenosis treated with DES, also had residual CAD treated medically at that time with 50% + 70-80% mid-septal perforator, 80% D1, 70% OM1, 50% ostial RCA, 25% mRCA.  . Bronchitis    history  . Carcinoma of breast ([place])    left masectomy in 1995  . CHF (congestive heart failure) ([place])   . Chronic diastolic heart failure ([place]) 11/21/2009  . Chronic hoarseness   . CKD (chronic kidney disease), stage II    per historical labs for age, weight, Cr  . CVD (cerebrovascular disease)    plaque w/o focal disease in 2006; h/o CVA  . Dementia   . Dementia   . Diabetes mellitus type II    no insulin   . Edema   . Herpes zoster   . Hyperlipidemia   . Hypertension   . Hyponatremia   . Hypothyroid   . Lower extremity edema 08/20/2011  . Malignant neoplasm of breast (female) ([place])   . MVP (mitral valve prolapse)    moderate; with moderate MR  . On home O2   . Osteoporosis   . Peripheral neuropathy   . Permanent atrial fibrillation ([place])   . PVD  (peripheral vascular disease) ([facility])    ABIs of 0.64 and 0.59, right and left leg in 2009  . PVD (peripheral vascular disease) ([place])   . 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 Shingles   . Ulcer    left lower leg  . Varicose veins of legs 08/20/2011    Patient Active Problem List   Diagnosis Date Noted  . Goals of care, counseling/discussion   . Palliative care encounter   . Bleeding from PICC line, sequela   . Acute on chronic congestive heart failure ([place])   . Anasarca 05/05/2017  . Severe protein-calorie malnutrition ([place]) 05/05/2017  . AKI (acute kidney injury) ([place]) 03/07/2017  . Cellulitis 03/07/2017  . Pressure injury of skin 02/18/2017  . Acute encephalopathy 02/18/2017  . Atrial fibrillation, rapid ([place]) 02/18/2017  . Permanent atrial fibrillation ([place]) 12/27/2016  . CAD in native artery 12/27/2016  . SIRS (systemic inflammatory response syndrome) ([place]) 12/10/2016  . Elevated troponin 12/10/2016  . SOB (shortness of breath)   . Lewy body dementia without behavioral disturbance   . Acute hypoxemic respiratory failure ([place]) 03/18/2016  . Hyponatremia 03/18/2016  . Abdominal distention 04/13/2014  . Hip pain 04/12/2014  . Labral tear of hip, degenerative 04/12/2014  . Trochanteric bursitis of right hip 04/12/2014  . Encounter for  therapeutic drug monitoring 10/06/2013  . Lower extremity edema 08/20/2011  . Arteriosclerotic cardiovascular disease (ASCVD)   . Carcinoma of breast ([place])   . Atrial fibrillation with RVR ([place])   . MVP (mitral valve prolapse)   . PVD (peripheral vascular disease) ([place])   . CVD (cerebrovascular disease)   . Thyroid activity decreased   . Osteoporosis   . Chronic anticoagulation 11/22/2010  . Type 2 diabetes mellitus ([place]) 11/21/2009  . Hyperlipidemia 11/21/2009  . Hypertension 11/21/2009  . Acute on chronic diastolic heart failure ([place]) 11/21/2009  . SPINAL STENOSIS, LUMBAR 09/05/2008    Past Surgical History:  Procedure Laterality Date  .  ABDOMINAL HYSTERECTOMY     abd?  . CARDIAC SURGERY    . CATARACT EXTRACTION, BILATERAL    . CHOLECYSTECTOMY  2006  . COLONOSCOPY  Date unknown  . CORONARY ANGIOPLASTY WITH STENT PLACEMENT    . left masectomy  1995  . ORIF of left hip  05/22/06   Darylene Price History    No data available       Home Medications    Prior to Admission medications   Medication Sig Start Date End Date Taking? Authorizing Provider  Amino Acids-Protein Hydrolys (FEEDING SUPPLEMENT, PRO-STAT SUGAR FREE 64,) LIQD Take 30 mLs by mouth 3 (three) times daily with meals.    [provider]  apixaban (ELIQUIS) 2.5 MG TABS tablet Take 1 tablet (2.5 mg total) by mouth 2 (two) times daily. 12/11/16   Isaac Bliss, Rayford Halsted, MD  atorvastatin (LIPITOR) 20 MG tablet Take 20 mg by mouth daily.    [provider]  calcium carbonate (TUMS - DOSED IN MG ELEMENTAL CALCIUM) 500 MG chewable tablet Chew 1 tablet by mouth 3 (three) times daily before meals.    [provider]  cholecalciferol (VITAMIN D) 1000 units tablet Take 1,000 Units by mouth daily.    [provider]  diltiazem (CARDIZEM CD) 240 MG 24 hr capsule Take 1 capsule (240 mg total) by mouth daily. 05/19/17   Kathie Dike, MD  gabapentin (NEURONTIN) 100 MG capsule Take 200 mg by mouth every 8 (eight) hours.     [provider]  HYDROcodone-acetaminophen (NORCO/VICODIN) 5-325 MG tablet Take 1-2 tablets by mouth every 4 (four) hours as needed for moderate pain. 05/19/17   Kathie Dike, MD  levalbuterol (XOPENEX) 0.63 MG/3ML nebulizer solution Inhale 3 mLs into the lungs 3 (three) times daily as needed for wheezing or cough. Via nebulizer 12/17/16   [provider]  levofloxacin (LEVAQUIN) 500 MG tablet  06/18/17   [provider]  levothyroxine (SYNTHROID, LEVOTHROID) 25 MCG tablet Take 25 mcg by mouth every evening.     [provider]  mirtazapine (REMERON) 7.5 MG tablet Take 7.5 mg by  mouth at bedtime.  03/20/17   [provider]  ondansetron (ZOFRAN) 4 MG tablet Take 1 mg by mouth every 6 (six) hours as needed for nausea or vomiting.    [provider]  polyethylene glycol (MIRALAX / GLYCOLAX) packet Take 17 g by mouth daily.    [provider]  Probiotic Product (PROBIOTIC FORMULA PO) Take 1 capsule by mouth daily.    [provider]  sodium phosphate (FLEET) 7-19 GM/118ML ENEM Place 133 mLs (1 enema total) rectally daily as needed for mild constipation or severe constipation. 05/19/17   Kathie Dike, MD  torsemide (DEMADEX) 20 MG tablet Take 2 tablets (40 mg total) by mouth daily. 05/20/17   Memon,  Jolaine Artist, MD    Family History Family History  Problem Relation Age of Onset  . Diabetes Mother     Social History Social History   Tobacco Use  . Smoking status: Never Smoker  . Smokeless tobacco: Never Used  . Tobacco comment: tobacco use - no   Substance Use Topics  . Alcohol use: No    Alcohol/week: 0.0 oz  . Drug use: No     Allergies   Patient has no known allergies.   Review of Systems Review of Systems  Constitutional: Negative for appetite change and fatigue.  HENT: Negative for congestion, ear discharge and sinus pressure.   Eyes: Negative for discharge.  Respiratory: Positive for shortness of breath. Negative for cough.   Cardiovascular: Negative for chest pain.  Gastrointestinal: Negative for abdominal pain and diarrhea.  Genitourinary: Negative for frequency and hematuria.  Musculoskeletal: Negative for back pain.  Skin: Negative for rash.  Neurological: Positive for weakness. Negative for focal weakness, seizures and headaches.  Psychiatric/Behavioral: Negative for hallucinations.     Physical Exam Updated Vital Signs BP 126/62   Pulse 69   Temp (!) 97.5 F (36.4 C) (Oral)   Resp (!) 23   SpO2 100%   Physical Exam  Constitutional: She appears well-developed.  HENT:  Head: Normocephalic.    Eyes: Conjunctivae and EOM are normal. No scleral icterus.  Neck: Neck supple. No thyromegaly present.  Cardiovascular: Normal rate and regular rhythm. Exam reveals no gallop and no friction rub.  No murmur heard. Pulmonary/Chest: No stridor. She has no wheezes. She has no rales. She exhibits no tenderness.  Abdominal: She exhibits no distension. There is no tenderness. There is no rebound.  Musculoskeletal: Normal range of motion. She exhibits edema.  Lymphadenopathy:    She has no cervical adenopathy.  Neurological: She exhibits normal muscle tone. Coordination normal.  Skin: No rash noted. No erythema.  Psychiatric: She has a normal mood and affect. Her behavior is normal.     ED Treatments / Results  Labs (all labs ordered are listed, but only abnormal results are displayed) Labs Reviewed  COMPREHENSIVE METABOLIC PANEL - Abnormal; Notable for the following components:      Result Value   Sodium 133 (*)    Potassium 5.4 (*)    Chloride 100 (*)    Glucose, Bld 115 (*)    BUN 29 (*)    Calcium 8.4 (*)    Total Protein 5.4 (*)    Albumin 2.3 (*)    GFR calc non Af Amer 57 (*)    All other components within normal limits  CBC WITH DIFFERENTIAL/PLATELET - Abnormal; Notable for the following components:   Hemoglobin 10.7 (*)    HCT 35.1 (*)    RDW 17.5 (*)    All other components within normal limits  TROPONIN I  CBC WITH DIFFERENTIAL/PLATELET  BRAIN NATRIURETIC PEPTIDE    EKG  EKG Interpretation None       Radiology Dg Chest 2 View  Result Date: 06/23/2017 CLINICAL DATA:  Patient is AMS. Notes state she was sent over to ED from cardiology. Hx of a-fib, ASCVD, bronchitis, carcinoma of breast, CHF, diabetes, HTN, , mitral valve prolapse, left mastectomy, cardiac surgery. EXAM: CHEST  2 VIEW COMPARISON:  05/15/2017 FINDINGS: Moderate enlargement of the cardiopericardial silhouette. No convincing mediastinal or hilar masses. There is vascular congestion with  interstitial thickening. Moderate pleural effusions obscure the hemidiaphragms. These findings are similar to the prior exam. Right PICC has been  removed. No pneumothorax. IMPRESSION: 1. Congestive heart failure reflected by cardiomegaly and interstitial pulmonary edema and moderate bilateral pleural effusions. Electronically Signed   By: Lajean Manes M.D.   On: 06/23/2017 19:03    Procedures Procedures (including critical care time)  Medications Ordered in ED Medications - No data to display   Initial Impression / Assessment and Plan / ED Course  I have reviewed the triage vital signs and the nursing notes.  Pertinent labs & imaging results that were available during my care of the patient were reviewed by me and considered in my medical decision making (see chart for details).   Patient with anasarca.  She will be admitted for diuresis    Final Clinical Impressions(s) / ED Diagnoses   Final diagnoses:  Diastolic congestive heart failure, unspecified HF chronicity [facility])    ED Discharge Orders    None       Milton Ferguson, MD 06/23/17 2228

## 2017-06-23 NOTE — ED Notes (Signed)
Patient given dinner tray and assisted with feeding by staff.

## 2017-06-23 NOTE — ED Triage Notes (Signed)
Sent here from cardiology.  Pt sent for to be evaluated for admission.

## 2017-06-23 NOTE — H&P (Addendum)
TRH H&P   Patient Demographics:    [name], is a 83 y.o. female  MRN: [id number]   DOB - [birth year]  Admit Date - 06/23/2017  Outpatient Primary MD for the patient is Mal Amabile, Anthony Sar, MD  No long [place],  [place].   Referring MD/NP/PA: [place]  Outpatient Specialists:   Patient coming from:   [facility]=> cardiology  Chief Complaint  Patient presents with  . Altered Mental Status      HPI:    [name]  is a 83 y.o. female, w Dementia, CVA, CKD stage 2, Pafib, CAD, CHF (EF 60-65%), Mod AS, Mod MR, who was at her cardiology visit and sent to ED for evaluation of CHF.  Pox 94% on RA.  Pt denies cp, palp, sob, orthopnea, pnd.  Admits to slight lower ext edema. Pt is a poor historian due to dementia.   In Ed,  CXR IMPRESSION: 1. Congestive heart failure reflected by cardiomegaly and interstitial pulmonary edema and moderate bilateral pleural Effusions.  Ekg Nsr at 55, nl axis, t inversion in v1-3.    Trop  <0.03 BNP  1,232  (prior bnp 1, 982)  Pt will be admitted for CHF    Review of systems:    In addition to the HPI above,  No Fever-chills, No Headache, No changes with Vision or hearing, No problems swallowing food or Liquids, No Chest pain,  No Abdominal pain, No Nausea or Vommitting, Bowel movements are regular, No Blood in stool or Urine, No dysuria, No new skin rashes or bruises, No new joints pains-aches,  No new weakness, tingling, numbness in any extremity, No recent weight gain or loss, No polyuria, polydypsia or polyphagia, No significant Mental Stressors.  A full 10 point Review of Systems was done, except as stated above, all other Review of Systems were negative.   With Past History of the following :    Past Medical History:  Diagnosis Date  . A-fib ([place])   .  Arteriosclerotic cardiovascular disease (ASCVD)    a. Cath 12/2002 showed high grade LAD stenosis treated with DES, also had residual CAD treated medically at that time with 50% + 70-80% mid-septal perforator, 80% D1, 70% OM1, 50% ostial RCA, 25% mRCA.  . Bronchitis    history  . Carcinoma of breast ([place])    left masectomy in 1995  . CHF (congestive heart failure) ([place])   . Chronic diastolic heart failure ([place]) 11/21/2009  . Chronic hoarseness   . CKD (chronic kidney disease), stage II    per historical labs for age, weight, Cr  . CVD (cerebrovascular disease)    plaque w/o focal disease in 2006; h/o CVA  . Dementia   . Dementia   . Diabetes mellitus type II    no insulin   . Edema   . Herpes zoster   .  Hyperlipidemia   . Hypertension   . Hyponatremia   . Hypothyroid   . Lower extremity edema 08/20/2011  . Malignant neoplasm of breast (female) ([place])   . MVP (mitral valve prolapse)    moderate; with moderate MR  . On home O2   . Osteoporosis   . Peripheral neuropathy   . Permanent atrial fibrillation ([place])   . PVD (peripheral vascular disease) (HCC)    ABIs of 0.64 and 0.59, right and left leg in 2009  . PVD (peripheral vascular disease) ([place])   . 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 Shingles   . Ulcer    left lower leg  . Varicose veins of legs 08/20/2011      Past Surgical History:  Procedure Laterality Date  . ABDOMINAL HYSTERECTOMY     abd?  . CARDIAC SURGERY    . CATARACT EXTRACTION, BILATERAL    . CHOLECYSTECTOMY  2006  . COLONOSCOPY  Date unknown  . CORONARY ANGIOPLASTY WITH STENT PLACEMENT    . left masectomy  1995  . ORIF of left hip  05/22/06   [name]      Social History:     Social History   Tobacco Use  . Smoking status: Never Smoker  . Smokeless tobacco: Never Used  . Tobacco comment: tobacco use - no   Substance Use Topics  . Alcohol use: No    Alcohol/week: 0.0 oz     Lives - at [facility], now [facility]  Mobility - unclear  if able to walk has heel breakdown   Family History :     Family History  Problem Relation Age of Onset  . Diabetes Mother       Home Medications:   Prior to Admission medications   Medication Sig Start Date End Date Taking? Authorizing Provider  Amino Acids-Protein Hydrolys (FEEDING SUPPLEMENT, PRO-STAT SUGAR FREE 64,) LIQD Take 30 mLs by mouth 3 (three) times daily with meals.   Yes [provider]  apixaban (ELIQUIS) 2.5 MG TABS tablet Take 1 tablet (2.5 mg total) by mouth 2 (two) times daily. 12/11/16  Yes Isaac Bliss, Rayford Halsted, MD  atorvastatin (LIPITOR) 20 MG tablet Take 20 mg by mouth daily.   Yes [provider]  calcium carbonate (TUMS - DOSED IN MG ELEMENTAL CALCIUM) 500 MG chewable tablet Chew 1 tablet by mouth 3 (three) times daily before meals.   Yes [provider]  cholecalciferol (VITAMIN D) 1000 units tablet Take 1,000 Units by mouth daily.   Yes [provider]  diltiazem (CARDIZEM CD) 240 MG 24 hr capsule Take 1 capsule (240 mg total) by mouth daily. 05/19/17  Yes Kathie Dike, MD  gabapentin (NEURONTIN) 100 MG capsule Take 200 mg by mouth every 8 (eight) hours.    Yes [provider]  HYDROcodone-acetaminophen (NORCO/VICODIN) 5-325 MG tablet Take 1-2 tablets by mouth every 4 (four) hours as needed for moderate pain. 05/19/17  Yes Kathie Dike, MD  levalbuterol (XOPENEX) 0.63 MG/3ML nebulizer solution Inhale 3 mLs into the lungs 3 (three) times daily as needed for wheezing or cough. Via nebulizer 12/17/16  Yes [provider]  levofloxacin (LEVAQUIN) 500 MG tablet Take 500 mg daily by mouth. To end course on 11/14 06/18/17  Yes [provider]  levothyroxine (SYNTHROID, LEVOTHROID) 25 MCG tablet Take 25 mcg by mouth every evening.    Yes [provider]  mirtazapine (REMERON) 7.5 MG tablet Take 7.5 mg by mouth at bedtime.  03/20/17  Yes [provider]  ondansetron (ZOFRAN) 4 MG tablet  Take 1 mg by mouth every 6 (six) hours as needed for nausea or vomiting.   Yes [provider]  OXYGEN Inhale 2 L daily into the lungs.   Yes [provider]  polyethylene glycol (MIRALAX / GLYCOLAX) packet Take 17 g by mouth daily.   Yes [provider]  Probiotic Product (PROBIOTIC FORMULA PO) Take 1 capsule by mouth daily.   Yes [provider]  sodium phosphate (FLEET) 7-19 GM/118ML ENEM Place 133 mLs (1 enema total) rectally daily as needed for mild constipation or severe constipation. 05/19/17  Yes Kathie Dike, MD  torsemide (DEMADEX) 20 MG tablet Take 2 tablets (40 mg total) by mouth daily. 05/20/17  Yes Kathie Dike, MD     Allergies:    No Known Allergies   Physical Exam:   Vitals  Blood pressure (!) 111/54, pulse 66, temperature (!) 97.5 F (36.4 C), temperature source Oral, resp. rate 19, SpO2 100 %.   1. General  lying in bed in NAD,    2. Normal affect and insight, Not Suicidal or Homicidal, Awake Alert, Oriented X 3.  3. No F.N deficits, ALL C.Nerves Intact, Strength 5/5 all 4 extremities, Sensation intact all 4 extremities, Plantars down going.  4. Ears and Eyes appear Normal, Conjunctivae clear, PERRLA. Moist Oral Mucosa.  5. Supple Neck, No JVD, No cervical lymphadenopathy appriciated, No Carotid Bruits.  6. Symmetrical Chest wall movement, Good air movement bilaterally, decrease in BS at bilateral lung base, no wheezing, no crackles.   7.  rrr s1, s2, 2/6 sem rusb, and 3/6 sem apex  8. Positive Bowel Sounds, Abdomen Soft, No tenderness, No organomegaly appriciated,No rebound -guarding or rigidity.  9.  No Cyanosis, Normal Skin Turgor, No Skin Rash or Bruise.  10. Good muscle tone,  joints appear normal , no effusions, Normal ROM.  11. No Palpable Lymph Nodes in Neck or Axillae     Data Review:    CBC Recent Labs  Lab 06/23/17 2148  WBC 7.4  HGB 10.7*  HCT 35.1*  PLT 241  MCV 87.3  MCH 26.6  MCHC 30.5    RDW 17.5*  LYMPHSABS 1.6  MONOABS 0.6  EOSABS 0.1  BASOSABS 0.0   ------------------------------------------------------------------------------------------------------------------  Chemistries  Recent Labs  Lab 06/23/17 1945  NA 133*  K 5.4*  CL 100*  CO2 24  GLUCOSE 115*  BUN 29*  CREATININE 0.85  CALCIUM 8.4*  AST 25  ALT 14  ALKPHOS 71  BILITOT 0.7   ------------------------------------------------------------------------------------------------------------------ CrCl cannot be calculated (Unknown ideal weight.). ------------------------------------------------------------------------------------------------------------------ No results for input(s): TSH, T4TOTAL, T3FREE, THYROIDAB in the last 72 hours.  Invalid input(s): FREET3  Coagulation profile No results for input(s): INR, PROTIME in the last 168 hours. ------------------------------------------------------------------------------------------------------------------- No results for input(s): DDIMER in the last 72 hours. -------------------------------------------------------------------------------------------------------------------  Cardiac Enzymes Recent Labs  Lab 06/23/17 1945  TROPONINI <0.03   ------------------------------------------------------------------------------------------------------------------    Component Value Date/Time   BNP 1,232.0 (H) 06/23/2017 2148     ---------------------------------------------------------------------------------------------------------------  Urinalysis    Component Value Date/Time   COLORURINE STRAW (A) 05/05/2017 1651   APPEARANCEUR CLEAR 05/05/2017 1651   LABSPEC 1.006 05/05/2017 1651   PHURINE 6.0 05/05/2017 1651   GLUCOSEU NEGATIVE 05/05/2017 1651   HGBUR SMALL (A) 05/05/2017 1651   BILIRUBINUR NEGATIVE 05/05/2017 1651   KETONESUR NEGATIVE 05/05/2017 1651   PROTEINUR NEGATIVE 05/05/2017 1651   UROBILINOGEN 0.2 04/19/2014 1730   NITRITE  NEGATIVE  05/05/2017 1651   LEUKOCYTESUR TRACE (A) 05/05/2017 1651    ----------------------------------------------------------------------------------------------------------------   Imaging Results:    Dg Chest 2 View  Result Date: 06/23/2017 CLINICAL DATA:  Patient is AMS. Notes state she was sent over to ED from cardiology. Hx of a-fib, ASCVD, bronchitis, carcinoma of breast, CHF, diabetes, HTN, , mitral valve prolapse, left mastectomy, cardiac surgery. EXAM: CHEST  2 VIEW COMPARISON:  05/15/2017 FINDINGS: Moderate enlargement of the cardiopericardial silhouette. No convincing mediastinal or hilar masses. There is vascular congestion with interstitial thickening. Moderate pleural effusions obscure the hemidiaphragms. These findings are similar to the prior exam. Right PICC has been removed. No pneumothorax. IMPRESSION: 1. Congestive heart failure reflected by cardiomegaly and interstitial pulmonary edema and moderate bilateral pleural effusions. Electronically Signed   By: Lajean Manes M.D.   On: 06/23/2017 19:03     Assessment & Plan:    Principal Problem:   CHF (congestive heart failure) (HCC) Active Problems:   Hyponatremia   Lewy body dementia without behavioral disturbance   Anemia   Hyperkalemia    CHF (diastolic), w mod AS, and mod MR Strict I and O Check daily weight Lasix 40mg  iv bid Trop I q6h x3 Check cardiac echo Cardiology consult   Hyperkalemia Calcium gluconate Sodium bicarbonate Kayexalate 15gm po x1 Check bmp in am  Hyponatremia (mild) Check cmp in am  Anemia Check cbc in am  Pafib  Cont cardizem Cont eliquis  Hypothyroidism Check tsh Cont levothyroxine  Hyperlipidemia Cont lipitor  Severe protein calorie malnutrition prostat   DVT Prophylaxis Eliquis  SCDs  AM Labs Ordered, also please review Full Orders  Family Communication: Admission, patients condition and plan of care including tests being ordered have been discussed with  the patient  who indicate understanding and agree with the plan and Code Status.  Code Status DNR  Likely DC to  [facility]  Condition GUARDED   Consults called:  Cardiology place in computer  Admission status: inpatient  Time spent in minutes : 45   Jani Gravel M.D on 06/23/2017 at 11:00 PM  Between 7am to 7pm - Pager - [phone]  . After 7pm go to [email or web address] - password [facility]  [facility] - Office  [phone]

## 2017-06-24 ENCOUNTER — Encounter (HOSPITAL_COMMUNITY): Payer: Self-pay | Admitting: *Deleted

## 2017-06-24 ENCOUNTER — Other Ambulatory Visit: Payer: Self-pay

## 2017-06-24 ENCOUNTER — Inpatient Hospital Stay (HOSPITAL_COMMUNITY): Payer: Medicare Other

## 2017-06-24 DIAGNOSIS — I5033 Acute on chronic diastolic (congestive) heart failure: Secondary | ICD-10-CM

## 2017-06-24 DIAGNOSIS — I495 Sick sinus syndrome: Secondary | ICD-10-CM

## 2017-06-24 DIAGNOSIS — G3183 Dementia with Lewy bodies: Secondary | ICD-10-CM

## 2017-06-24 DIAGNOSIS — Z515 Encounter for palliative care: Secondary | ICD-10-CM

## 2017-06-24 DIAGNOSIS — I4891 Unspecified atrial fibrillation: Secondary | ICD-10-CM

## 2017-06-24 DIAGNOSIS — Z7189 Other specified counseling: Secondary | ICD-10-CM

## 2017-06-24 DIAGNOSIS — I361 Nonrheumatic tricuspid (valve) insufficiency: Secondary | ICD-10-CM

## 2017-06-24 DIAGNOSIS — I38 Endocarditis, valve unspecified: Secondary | ICD-10-CM

## 2017-06-24 DIAGNOSIS — F0280 Dementia in other diseases classified elsewhere without behavioral disturbance: Secondary | ICD-10-CM

## 2017-06-24 LAB — ECHOCARDIOGRAM COMPLETE
AO mean calculated velocity dopler: 122 cm/s
AOPV: 0.28 m/s
AOVTI: 31.3 cm
AV Area VTI index: 0.7 cm2/m2
AV Area VTI: 0.88 cm2
AV Area mean vel: 0.94 cm2
AV Mean grad: 7 mmHg
AV Peak grad: 15 mmHg
AV vel: 1.18
AVAREAMEANVIN: 0.55 cm2/m2
AVCELMEANRAT: 0.3
AVPKVEL: 194 cm/s
CHL CUP AV PEAK INDEX: 0.52
CHL CUP AV VALUE AREA INDEX: 0.7
CHL CUP MV DEC (S): 187
CHL CUP RV SYS PRESS: 77 mmHg
CHL CUP STROKE VOLUME: 38 mL
CHL CUP TV REG PEAK VELOCITY: 394 cm/s
E decel time: 187 msec
E/e' ratio: 15.59
FS: 45 % — AB (ref 28–44)
HEIGHTINCHES: 63 in
IV/PV OW: 1.06
LA ID, A-P, ES: 61 mm
LA diam end sys: 61 mm
LADIAMINDEX: 3.59 cm/m2
LAVOL: 149 mL
LAVOLA4C: 156 mL
LAVOLIN: 87.8 mL/m2
LV E/e' medial: 15.59
LV TDI E'MEDIAL: 6.74
LV sys vol: 19 mL
LVDIAVOL: 57 mL (ref 46–106)
LVDIAVOLIN: 34 mL/m2
LVEEAVG: 15.59
LVELAT: 10.2 cm/s
LVOT VTI: 11.8 cm
LVOT area: 3.14 cm2
LVOT diameter: 20 mm
LVOT peak grad rest: 1 mmHg
LVOTPV: 54.4 cm/s
LVOTSV: 37 mL
LVOTVTI: 0.38 cm
LVSYSVOLIN: 11 mL/m2
Lateral S' vel: 6.96 cm/s
MV Peak grad: 10 mmHg
MV VTI: 152 cm
MV pk E vel: 159 m/s
PW: 12.7 mm — AB (ref 0.6–1.1)
RV TAPSE: 10 mm
Simpson's disk: 67
TDI e' lateral: 10.2
TRMAXVEL: 394 cm/s
Valve area: 1.18 cm2
Weight: 2253.98 oz

## 2017-06-24 LAB — CBC
HEMATOCRIT: 37.8 % (ref 36.0–46.0)
HEMOGLOBIN: 11.6 g/dL — AB (ref 12.0–15.0)
MCH: 26.9 pg (ref 26.0–34.0)
MCHC: 30.7 g/dL (ref 30.0–36.0)
MCV: 87.5 fL (ref 78.0–100.0)
Platelets: 213 10*3/uL (ref 150–400)
RBC: 4.32 MIL/uL (ref 3.87–5.11)
RDW: 17.6 % — ABNORMAL HIGH (ref 11.5–15.5)
WBC: 6.5 10*3/uL (ref 4.0–10.5)

## 2017-06-24 LAB — HEMOGLOBIN A1C
HEMOGLOBIN A1C: 6.3 % — AB (ref 4.8–5.6)
MEAN PLASMA GLUCOSE: 134.11 mg/dL

## 2017-06-24 LAB — GLUCOSE, CAPILLARY
GLUCOSE-CAPILLARY: 95 mg/dL (ref 65–99)
Glucose-Capillary: 108 mg/dL — ABNORMAL HIGH (ref 65–99)
Glucose-Capillary: 142 mg/dL — ABNORMAL HIGH (ref 65–99)
Glucose-Capillary: 126 mg/dL — ABNORMAL HIGH (ref 65–99)

## 2017-06-24 LAB — COMPREHENSIVE METABOLIC PANEL
ALT: 12 U/L — ABNORMAL LOW (ref 14–54)
AST: 23 U/L (ref 15–41)
Albumin: 2.7 g/dL — ABNORMAL LOW (ref 3.5–5.0)
Alkaline Phosphatase: 80 U/L (ref 38–126)
Anion gap: 6 (ref 5–15)
BILIRUBIN TOTAL: 0.7 mg/dL (ref 0.3–1.2)
BUN: 28 mg/dL — AB (ref 6–20)
CALCIUM: 8.9 mg/dL (ref 8.9–10.3)
CO2: 34 mmol/L — AB (ref 22–32)
Chloride: 95 mmol/L — ABNORMAL LOW (ref 101–111)
Creatinine, Ser: 0.78 mg/dL (ref 0.44–1.00)
GFR calc Af Amer: >60 mL/min (ref >60)
Glucose, Bld: 140 mg/dL — ABNORMAL HIGH (ref 65–99)
Potassium: 4.2 mmol/L (ref 3.5–5.1)
Sodium: 135 mmol/L (ref 135–145)
TOTAL PROTEIN: 6.1 g/dL — AB (ref 6.5–8.1)

## 2017-06-24 LAB — TROPONIN I
Troponin I: <0.03 ng/mL (ref <0.03)
Troponin I: <0.03 ng/mL (ref <0.03)

## 2017-06-24 LAB — MRSA PCR SCREENING: MRSA BY PCR: NEGATIVE

## 2017-06-24 LAB — TSH: TSH: 4.361 u[IU]/mL (ref 0.350–4.500)

## 2017-06-24 MED ORDER — SODIUM CHLORIDE 0.9% FLUSH
3.0000 mL | Freq: Two times a day (BID) | INTRAVENOUS | Status: DC
  Administered 2017-06-24 – 2017-06-27 (×7): 3 mL via INTRAVENOUS

## 2017-06-24 MED ORDER — ATORVASTATIN CALCIUM 20 MG PO TABS
20.0000 mg | ORAL_TABLET | Freq: Every day | ORAL | Status: DC
  Administered 2017-06-24 – 2017-06-27 (×4): 20 mg via ORAL
  Filled 2017-06-24 (×4): qty 1

## 2017-06-24 MED ORDER — SODIUM CHLORIDE 0.9% FLUSH: 3.0000 mL | INTRAVENOUS | Status: DC | PRN

## 2017-06-24 MED ORDER — MIRTAZAPINE 15 MG PO TABS
7.5000 mg | ORAL_TABLET | Freq: Every day | ORAL | Status: DC
  Administered 2017-06-24 – 2017-06-26 (×4): 7.5 mg via ORAL
  Filled 2017-06-24 (×4): qty 1

## 2017-06-24 MED ORDER — ONDANSETRON HCL 4 MG/2ML IJ SOLN: 4.0000 mg | Freq: Four times a day (QID) | INTRAMUSCULAR | Status: DC | PRN

## 2017-06-24 MED ORDER — POLYETHYLENE GLYCOL 3350 17 G PO PACK
17.0000 g | PACK | Freq: Every day | ORAL | Status: DC
  Administered 2017-06-24 – 2017-06-26 (×3): 17 g via ORAL
  Filled 2017-06-24 (×4): qty 1

## 2017-06-24 MED ORDER — SODIUM CHLORIDE 0.9 % IV SOLN: 250.0000 mL | INTRAVENOUS | Status: DC | PRN

## 2017-06-24 MED ORDER — DILTIAZEM HCL ER COATED BEADS 240 MG PO CP24
240.0000 mg | ORAL_CAPSULE | Freq: Every day | ORAL | Status: DC
  Administered 2017-06-24 – 2017-06-27 (×4): 240 mg via ORAL
  Filled 2017-06-24 (×4): qty 1

## 2017-06-24 MED ORDER — INSULIN ASPART 100 UNIT/ML ~~LOC~~ SOLN
0.0000 [IU] | Freq: Three times a day (TID) | SUBCUTANEOUS | Status: DC
  Administered 2017-06-24: 1 [IU] via SUBCUTANEOUS
  Administered 2017-06-27: 2 [IU] via SUBCUTANEOUS

## 2017-06-24 MED ORDER — GABAPENTIN 100 MG PO CAPS
200.0000 mg | ORAL_CAPSULE | Freq: Three times a day (TID) | ORAL | Status: DC
  Administered 2017-06-24 – 2017-06-27 (×12): 200 mg via ORAL
  Filled 2017-06-24 (×12): qty 2

## 2017-06-24 MED ORDER — JUVEN PO PACK
1.0000 | PACK | Freq: Two times a day (BID) | ORAL | Status: DC
  Administered 2017-06-24 – 2017-06-27 (×6): 1 via ORAL
  Filled 2017-06-24 (×13): qty 1

## 2017-06-24 MED ORDER — FUROSEMIDE 10 MG/ML IJ SOLN
40.0000 mg | Freq: Two times a day (BID) | INTRAMUSCULAR | Status: DC
  Administered 2017-06-24 – 2017-06-25 (×3): 40 mg via INTRAVENOUS
  Filled 2017-06-24 (×4): qty 4

## 2017-06-24 MED ORDER — ACETAMINOPHEN 325 MG PO TABS: 650.0000 mg | ORAL_TABLET | Freq: Four times a day (QID) | ORAL | Status: DC | PRN

## 2017-06-24 MED ORDER — LEVOTHYROXINE SODIUM 25 MCG PO TABS
25.0000 ug | ORAL_TABLET | Freq: Every evening | ORAL | Status: DC
  Administered 2017-06-24 – 2017-06-26 (×3): 25 ug via ORAL
  Filled 2017-06-24 (×3): qty 1

## 2017-06-24 MED ORDER — APIXABAN 2.5 MG PO TABS
2.5000 mg | ORAL_TABLET | Freq: Two times a day (BID) | ORAL | Status: DC
  Filled 2017-06-24 (×4): qty 1

## 2017-06-24 MED ORDER — PRO-STAT SUGAR FREE PO LIQD
30.0000 mL | Freq: Three times a day (TID) | ORAL | Status: DC
  Administered 2017-06-24 – 2017-06-27 (×8): 30 mL via ORAL
  Filled 2017-06-24 (×8): qty 30

## 2017-06-24 MED ORDER — INSULIN ASPART 100 UNIT/ML ~~LOC~~ SOLN: 0.0000 [IU] | Freq: Every day | SUBCUTANEOUS | Status: DC

## 2017-06-24 MED ORDER — APIXABAN 2.5 MG PO TABS
2.5000 mg | ORAL_TABLET | Freq: Two times a day (BID) | ORAL | Status: DC
  Administered 2017-06-24 – 2017-06-27 (×8): 2.5 mg via ORAL
  Filled 2017-06-24 (×11): qty 1

## 2017-06-24 MED ORDER — HYDROCODONE-ACETAMINOPHEN 5-325 MG PO TABS
1.0000 | ORAL_TABLET | ORAL | Status: DC | PRN
  Administered 2017-06-24: 1 via ORAL
  Administered 2017-06-26 (×2): 2 via ORAL
  Filled 2017-06-24: qty 1
  Filled 2017-06-24 (×2): qty 2

## 2017-06-24 MED ORDER — ACETAMINOPHEN 650 MG RE SUPP
650.0000 mg | Freq: Four times a day (QID) | RECTAL | Status: DC | PRN
Start: 2017-06-24 — End: 2017-06-27

## 2017-06-24 MED ORDER — DILTIAZEM HCL ER COATED BEADS 240 MG PO CP24: 240.0000 mg | ORAL_CAPSULE | Freq: Every day | ORAL | Status: DC

## 2017-06-24 MED ORDER — DILTIAZEM HCL ER COATED BEADS 120 MG PO CP24: 120.0000 mg | ORAL_CAPSULE | Freq: Every day | ORAL | Status: DC

## 2017-06-24 MED ORDER — ADULT MULTIVITAMIN LIQUID CH
15.0000 mL | Freq: Every day | ORAL | Status: DC
  Administered 2017-06-24 – 2017-06-27 (×4): 15 mL via ORAL
  Filled 2017-06-24 (×7): qty 15

## 2017-06-24 MED ORDER — FLEET ENEMA 7-19 GM/118ML RE ENEM: 1.0000 | ENEMA | Freq: Every day | RECTAL | Status: DC | PRN

## 2017-06-24 NOTE — Consult Note (Signed)
[facility] Nurse wound consult note Reason for Consult: Consult performed via remote camera with assistance from the bedside nurse for assessment and measurements. Wound type: Sacrum and bilat upper buttocks with dark reddish purple deep tissue injury; 10X10cm Left heel with unstageable pressure injury; 7X4.5cm, 100% dry tightly adhered eschar, no odor, drainage, or fluctuance. Pressure Injury POA: Yes Dressing procedure/placement/frequency: Pt is on a low airloss bed to reduce pressure.  It is best practice to leave dry stable eschar intact to left heel,  Foam dressing to sacrum and heel to protect from further injury.  No family present to discuss plan of care. Please re-consult if further assistance is needed.  Thank-you,  Julien Girt MSN, [place], [place], [place], [place]

## 2017-06-24 NOTE — Plan of Care (Signed)
Patient has external foley in place. Given IV lasix this morning. Will monitor for signs of retention

## 2017-06-24 NOTE — Consult Note (Signed)
Consultation Note Date: 06/24/2017   Patient Name: [name]  DOB: 05-Nov-1920  MRN: [id number]  Age / Sex: 83 y.o., female  PCP: [name] Corrigan, MD Referring Physician: Dessa Phi, DO  Reason for Consultation: Establishing goals of care and Psychosocial/spiritual support  HPI/Patient Profile: 83 y.o. female  with past medical history of history of CVA, stage II kidney disease, A. fib, CAD, CHF, dementia, resident of skilled nursing facility 3-4 years, admitted on 06/23/2017 with CHF, failure to thrive.   Clinical Assessment and Goals of Care: [name] is resting quietly in bed.  She greets me, briefly making but not keeping eye contact.  She looks frail, chronically ill.  She is oriented to person, thinks we are in the nursing home, and knows the year.  Present today at bedside is son [name].  I share that my understanding was [name]'s brother, [name], was to be at our meeting. [name] states that his [name] thought he would be there at lunch.    [name] and I talked privately.  He almost immediately states that he has spoken with [name], of [name].  I share the benefits of [name], stating the medical team would endorse [name] returning with [name].  I sure that [name] has a less aggressive focus.  We talked about the difference in wound care with [name], less aggressive, less painful wound care.  As were talking, a family friend, and, looks in the door.  It [name] invites her to our conversation.  We continue talking about wound care, and [name].  We also talked about nutritional needs.  I share that as people age, they usually eat less, this is normal and expected.  I also share that people need protein to heal wounds, and I would not be surprised if [name] is not able to heal these wounds.  We talked about having a family meeting tomorrow.  [name] agrees to contact his brother [name] for a family  meeting, 11/14 at 1300.  I will continue to work with [name] son's to understand the severity of her condition.   Health care power of attorney HCPOA - son [name] tells me that he has legal healthcare power of attorney.  He states that he shared this paperwork on the last visit.  I do not have a copy in the documents file.  I encourage him to bring this paperwork during his next visit, we will make a photocopy for [name] chart, and return his paperwork.    [name] states that his brother, [name], does not actively help with his mother's care. He states that they had a sister who passed away 3 or 4 years ago.   SUMMARY OF RECOMMENDATIONS   [name] states that his desires for his mother to return to [name], residential SNF, with the benefits of [name] [name].  Code Status/Advance Care Planning:  DNR  Symptom Management:   Per hospitalist, no additional needs at this time.  Palliative Prophylaxis:   Palliative Wound Care and Turn Reposition  Additional  Recommendations (Limitations, Scope, Preferences):  At this point, continue to treat the treatable but no CPR, no intubation.  Psycho-social/Spiritual:   Desire for further Chaplaincy support:no  Additional Recommendations: Caregiving  Support/Resources and Education on [name]  Prognosis:   < 3 months, would not be surprising based on frailty, continue functional decline, 4 hospitalizations in 6 months, continued low albumin of 2.3 on admit.  Discharge Planning: Likely return to [name], residential SNF with the benefit of [name] [name]      Primary Diagnoses: Present on Admission: . Hyponatremia . Lewy body dementia without behavioral disturbance . Pressure injury of skin   I have reviewed the medical record, interviewed the patient and family, and examined the patient. The following aspects are pertinent.  Past Medical History:  Diagnosis Date  . A-fib ([name])   . Arteriosclerotic cardiovascular disease (ASCVD)    a.  Cath 12/2002 showed high grade LAD stenosis treated with DES, also had residual CAD treated medically at that time with 50% + 70-80% mid-septal perforator, 80% D1, 70% OM1, 50% ostial RCA, 25% mRCA.  . Bronchitis    history  . Carcinoma of breast ([name])    left masectomy in 1995  . CHF (congestive heart failure) ([name])   . Chronic diastolic heart failure ([name]) 11/21/2009  . Chronic hoarseness   . CKD (chronic kidney disease), stage II    per historical labs for age, weight, Cr  . CVD (cerebrovascular disease)    plaque w/o focal disease in 2006; h/o CVA  . Dementia   . Dementia   . Diabetes mellitus type II    no insulin   . Edema   . Herpes zoster   . Hyperlipidemia   . Hypertension   . Hyponatremia   . Hypothyroid   . Lower extremity edema 08/20/2011  . Malignant neoplasm of breast (female) ([name])   . MVP (mitral valve prolapse)    moderate; with moderate MR  . On home O2   . Osteoporosis   . Peripheral neuropathy   . Permanent atrial fibrillation ([name])   . PVD (peripheral vascular disease) ([name])    ABIs of 0.64 and 0.59, right and left leg in 2009  . PVD (peripheral vascular disease) ([name])   . 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 Shingles   . Ulcer    left lower leg  . Varicose veins of legs 08/20/2011   Social History   Socioeconomic History  . Marital status: Widowed    Spouse name: None  . Number of children: None  . Years of education: None  . Highest education level: None  Social Needs  . Financial resource strain: None  . Food insecurity - worry: None  . Food insecurity - inability: None  . Transportation needs - medical: None  . Transportation needs - non-medical: None  Occupational History  . None  Tobacco Use  . Smoking status: Never Smoker  . Smokeless tobacco: Never Used  . Tobacco comment: tobacco use - no   Substance and Sexual Activity  . Alcohol use: No    Alcohol/week: 0.0 oz  . Drug use: No  . Sexual activity: None  Other Topics Concern  . None    Social History Narrative   Widowed, retired, does not get regular exercise.    Family History  Problem Relation Age of Onset  . Diabetes Mother    Scheduled Meds: . apixaban  2.5 mg Oral BID  . atorvastatin  20 mg Oral Daily  . diltiazem  240 mg Oral Daily  . feeding supplement (PRO-STAT SUGAR FREE 64)  30 mL Oral TID WC  . furosemide  40 mg Intravenous Q12H  . gabapentin  200 mg Oral Q8H  . insulin aspart  0-5 Units Subcutaneous QHS  . insulin aspart  0-9 Units Subcutaneous TID WC  . levothyroxine  25 mcg Oral QPM  . mirtazapine  7.5 mg Oral QHS  . multivitamin  15 mL Oral Daily  . nutrition supplement (JUVEN)  1 packet Oral BID BM  . polyethylene glycol  17 g Oral Daily  . sodium chloride flush  3 mL Intravenous Q12H   Continuous Infusions: . sodium chloride     PRN Meds:.sodium chloride, acetaminophen **OR** acetaminophen, HYDROcodone-acetaminophen, ondansetron (ZOFRAN) IV, sodium chloride flush, sodium phosphate Medications Prior to Admission:  Prior to Admission medications   Medication Sig Start Date End Date Taking? Authorizing Provider  Amino Acids-Protein Hydrolys (FEEDING SUPPLEMENT, PRO-STAT SUGAR FREE 64,) LIQD Take 30 mLs by mouth 3 (three) times daily with meals.   Yes [provider]  apixaban (ELIQUIS) 2.5 MG TABS tablet Take 1 tablet (2.5 mg total) by mouth 2 (two) times daily. 12/11/16  Yes Isaac Bliss, Rayford Halsted, MD  atorvastatin (LIPITOR) 20 MG tablet Take 20 mg by mouth daily.   Yes [provider]  calcium carbonate (TUMS - DOSED IN MG ELEMENTAL CALCIUM) 500 MG chewable tablet Chew 1 tablet by mouth 3 (three) times daily before meals.   Yes [provider]  cholecalciferol (VITAMIN D) 1000 units tablet Take 1,000 Units by mouth daily.   Yes [provider]  diltiazem (CARDIZEM CD) 240 MG 24 hr capsule Take 1 capsule (240 mg total) by mouth daily. 05/19/17  Yes Kathie Dike, MD  gabapentin (NEURONTIN) 100 MG capsule  Take 200 mg by mouth every 8 (eight) hours.    Yes [provider]  HYDROcodone-acetaminophen (NORCO/VICODIN) 5-325 MG tablet Take 1-2 tablets by mouth every 4 (four) hours as needed for moderate pain. 05/19/17  Yes Kathie Dike, MD  levalbuterol (XOPENEX) 0.63 MG/3ML nebulizer solution Inhale 3 mLs into the lungs 3 (three) times daily as needed for wheezing or cough. Via nebulizer 12/17/16  Yes [provider]  levofloxacin (LEVAQUIN) 500 MG tablet Take 500 mg daily by mouth. To end course on 11/14 06/18/17  Yes [provider]  levothyroxine (SYNTHROID, LEVOTHROID) 25 MCG tablet Take 25 mcg by mouth every evening.    Yes [provider]  mirtazapine (REMERON) 7.5 MG tablet Take 7.5 mg by mouth at bedtime.  03/20/17  Yes [provider]  ondansetron (ZOFRAN) 4 MG tablet Take 1 mg by mouth every 6 (six) hours as needed for nausea or vomiting.   Yes [provider]  OXYGEN Inhale 2 L daily into the lungs.   Yes [provider]  polyethylene glycol (MIRALAX / GLYCOLAX) packet Take 17 g by mouth daily.   Yes [provider]  Probiotic Product (PROBIOTIC FORMULA PO) Take 1 capsule by mouth daily.   Yes [provider]  sodium phosphate (FLEET) 7-19 GM/118ML ENEM Place 133 mLs (1 enema total) rectally daily as needed for mild constipation or severe constipation. 05/19/17  Yes Kathie Dike, MD  torsemide (DEMADEX) 20 MG tablet Take 2 tablets (40 mg total) by mouth daily. 05/20/17  Yes Kathie Dike, MD   No Known Allergies Review of Systems  Unable to perform ROS: Acuity of condition    Physical Exam  Nursing note and vitals reviewed.  Vital Signs: BP 109/63   Pulse (!) 123   Temp 97.7 F (36.5 C) (Oral)   Resp (!) 28   Ht 5\' 3"  (1.6 m)   Wt 63.9 kg (140 lb 14 oz)   SpO2 98%   BMI 24.95 kg/m  Pain Assessment: 0-10   Pain Score: 3    SpO2: SpO2: 98 % O2 Device:SpO2: 98 % O2 Flow Rate: .O2 Flow Rate  (L/min): 2 L/min  IO: Intake/output summary:   Intake/Output Summary (Last 24 hours) at 06/24/2017 1335 Last data filed at 06/24/2017 1200 Gross per 24 hour  Intake -  Output 2 ml  Net -2 ml    LBM:   Baseline Weight: Weight: 63.9 kg (140 lb 14 oz) Most recent weight: Weight: 63.9 kg (140 lb 14 oz)     Palliative Assessment/Data:   Flowsheet Rows     Most Recent Value  Intake Tab  Referral Department  Hospitalist  Unit at Time of Referral  ICU  Palliative Care Primary Diagnosis  Cardiac  Date Notified  06/24/17  Palliative Care Type  Return patient Palliative Care  Reason for referral  Clarify Goals of Care  Date of Admission  06/23/17  Date first seen by Palliative Care  06/24/17  # of days Palliative referral response time  0 Day(s)  # of days IP prior to Palliative referral  1  Clinical Assessment  Palliative Performance Scale Score  30%  Pain Max last 24 hours  Not able to report  Pain Min Last 24 hours  Not able to report  Dyspnea Max Last 24 Hours  Not able to report  Dyspnea Min Last 24 hours  Not able to report  Psychosocial & Spiritual Assessment  Palliative Care Outcomes  Patient/Family meeting held?  Yes  Who was at the meeting?  Patient and son [name] at bedside  Patient/Family wishes: Interventions discontinued/not started   Mechanical Ventilation      Time In: 1230 Time Out: 1305 Time Total: 35 minutes Greater than 50%  of this time was spent counseling and coordinating care related to the above assessment and plan.  Signed by: Drue Novel, NP   Please contact Palliative Medicine Team phone at [phone] for questions and concerns.  For individual provider: See Shea Evans

## 2017-06-24 NOTE — Progress Notes (Addendum)
Progress Note  Patient Name: [name] Date of Encounter: 06/24/2017  Primary Cardiologist: Dorris Carnes, MD  Subjective   Pleasantly confused. Denies dyspnea   Inpatient Medications    Scheduled Meds: . apixaban  2.5 mg Oral BID  . atorvastatin  20 mg Oral Daily  . diltiazem  240 mg Oral Daily  . feeding supplement (PRO-STAT SUGAR FREE 64)  30 mL Oral TID WC  . feeding supplement (PRO-STAT SUGAR FREE 64)  30 mL Oral BID  . gabapentin  200 mg Oral Q8H  . insulin aspart  0-5 Units Subcutaneous QHS  . insulin aspart  0-9 Units Subcutaneous TID WC  . levothyroxine  25 mcg Oral QPM  . mirtazapine  7.5 mg Oral QHS  . polyethylene glycol  17 g Oral Daily  . sodium chloride flush  3 mL Intravenous Q12H   Continuous Infusions: . sodium chloride     PRN Meds: sodium chloride, acetaminophen **OR** acetaminophen, HYDROcodone-acetaminophen, ondansetron (ZOFRAN) IV, sodium chloride flush, sodium phosphate   Vital Signs    Vitals:   06/23/17 2230 06/23/17 2300 06/23/17 2330 06/24/17 0037  BP: (!) 111/54 108/76 125/75   Pulse: 66 80 84   Resp: 19 20 (!) 21   Temp:    (!) 97.5 F (36.4 C)  TempSrc:    Oral  SpO2: 100% 100% 97%   Weight:    140 lb 14 oz (63.9 kg)  Height:    5\' 3"  (1.6 m)   No intake or output data in the 24 hours ending 06/24/17 0715 Filed Weights   06/24/17 0037  Weight: 140 lb 14 oz (63.9 kg)    Telemetry    Atrial fib rates in the 90's.  Personally Reviewed  ECG    Atrial fib with slow ventricular response, rate of 55 with 1.5 second pause. Personally Reviewed  Physical Exam   GEN: No acute distress.  Frail, ill appearing.  Neck: No JVD Cardiac: IRRR, 2/6 systolic murmurs, rubs, or gallops.  Respiratory: Diminished in the bases.  GI: Soft, nontender, non-distended  MS: 2+ pretibial with wound on lower left ankle. Dressing noted. Venous stasis skin discoloration.  Neuro:  Nonfocal  Psych: Pleasantly confused.   Labs     Chemistry Recent Labs  Lab 06/23/17 1945 06/24/17 0217  NA 133* 135  K 5.4* 4.2  CL 100* 95*  CO2 24 34*  GLUCOSE 115* 140*  BUN 29* 28*  CREATININE 0.85 0.78  CALCIUM 8.4* 8.9  PROT 5.4* 6.1*  ALBUMIN 2.3* 2.7*  AST 25 23  ALT 14 12*  ALKPHOS 71 80  BILITOT 0.7 0.7  GFRNONAA 57* >60  GFRAA >60 >60  ANIONGAP 9 6     Hematology Recent Labs  Lab 06/23/17 2148 06/24/17 0217  WBC 7.4 6.5  RBC 4.02 4.32  HGB 10.7* 11.6*  HCT 35.1* 37.8  MCV 87.3 87.5  MCH 26.6 26.9  MCHC 30.5 30.7  RDW 17.5* 17.6*  PLT 241 213    Cardiac Enzymes Recent Labs  Lab 06/23/17 1945 06/24/17 0217  TROPONINI <0.03 <0.03    BNP Recent Labs  Lab 06/23/17 2148  BNP 1,232.0*     Radiology    Dg Chest 2 View  Result Date: 06/23/2017 CLINICAL DATA:  Patient is AMS. Notes state she was sent over to ED from cardiology. Hx of a-fib, ASCVD, bronchitis, carcinoma of breast, CHF, diabetes, HTN, , mitral valve prolapse, left mastectomy, cardiac surgery. EXAM: CHEST  2 VIEW COMPARISON:  05/15/2017 FINDINGS: Moderate enlargement of the cardiopericardial silhouette. No convincing mediastinal or hilar masses. There is vascular congestion with interstitial thickening. Moderate pleural effusions obscure the hemidiaphragms. These findings are similar to the prior exam. Right PICC has been removed. No pneumothorax. IMPRESSION: 1. Congestive heart failure reflected by cardiomegaly and interstitial pulmonary edema and moderate bilateral pleural effusions. Electronically Signed   By: Lajean Manes M.D.   On: 06/23/2017 19:03    Cardiac Studies  Echocardiogram 05/06/2017 Left ventricle: The cavity size was normal. Wall thickness was   increased in a pattern of moderate LVH. Systolic function was   normal. The estimated ejection fraction was in the range of 60%   to 65%. Wall motion was normal; there were no regional wall   motion abnormalities. - Aortic valve: Severely calcified annulus. Trileaflet;  severely   thickened leaflets. There was moderate stenosis. Lower gradient   than expected for moderate AS likely due to low stroke volume   index (paradoxical low flow low gradient AS). Morphologically and   by AVA VTI there is at least moderate AS. Mean gradient (S): 7 mm   Hg. VTI ratio of LVOT to aortic valve: 0.37. Valve area (VTI):   1.17 cm^2. Valve area (Vmax): 0.91 cm^2. - Mitral valve: Moderately calcified annulus. Mildly thickened   leaflets . There was moderate regurgitation. The MR is eccentric   and may be underestimated. The MR VC is 0.4 cm. - Left atrium: The atrium was massively dilated. - Right ventricle: The ventricular septum is flattened in systole   consistent with RV pressure overload. The cavity size was   moderately dilated. Systolic function was moderately reduced.   TAPSE: 10 mm . - Right atrium: The atrium was massively dilated. - Atrial septum: No defect or patent foramen ovale was identified. - Tricuspid valve: There was mild-moderate regurgitation. - Pulmonary arteries: Systolic pressure was severely increased. PA   peak pressure: 79 mm Hg (S). - Inferior vena cava: The vessel was dilated. The respirophasic   diameter changes were blunted (< 50%), consistent with elevated   central venous pressure. - Pericardium, extracardiac: There is a large left pleural   effusion.   Patient Profile     83 y.o. female with known history of chronic diastolic CHF, persistent atrial fib, cor pulmonale,, sent over from cardiology office to ER after being seen on follow up for CHF. Patient found to be decompensated with abnormal EKG revealing atrial fib with slow ventricular response. She was admitted for decompensated CHF and pulmonary edema with anasarca.   Assessment & Plan    1. Acute on Chronic Mixed CHF: Pulmonary edema with CHF is noted on CXR. Was to be on lasix 40 mg IV but not ordered. Will order this now. Will also order Pure-Wick catheter for better I/O to  avoid insertion of indwelling catheter. Creatinine 0.79. Weight 140 lbs on admission Discharge weight on 05/19/2017 138 lbs.   2. Atrial fib with slow ventricular response: Diltiazem has been decreased from 360 to 240 mg daily. HR in the 90's.   3. AoV Stenosis: Severely calcified valve. Significantly impacting recurrent CHF decompensations. She is DNR. Will need to approach sone about possible Hospice Care. Will consult Palliative Care.   4..Dementia: Pleasantly confused but cooperative. Communication should be done through her son, Chrissie Noa. I have spoken to [place] by phone on mobile number. He is agreeable to talk to Palliative Care and consider Hospice. He and his brother are going to be here around lunchtime.  5. Left Ankle Wound: Currently being treated by nurses, with deep sacral decubitus as well. Will consult wound care.     Signed, Phill Myron. West Pugh, ANP, AACC   06/24/2017, 7:15 AM    The patient was seen and examined, and I agree with the history, physical exam, assessment and plan as documented above, with modifications as noted below.  83 year old woman with chronic diastolic heart failure, cor pulmonale, severe pulmonary hypertension, chronic atrial fibrillation, tachycardia-bradycardia syndrome, and valvular heart disease with moderate aortic stenosis, moderate mitral regurgitation, and mild to moderate tricuspid regurgitation admitted with acute on chronic diastolic heart failure. She also has significant protein calorie malnutrition (albumin 2.7) with reduced oncotic pressure further contributing to generalized edema.  She was admitted from the office yesterday by K.  Lawrence DNP for diuresis.  Unfortunately, this was not administered on 06/23/17.  Heart rate during my examination ranges from the 11-130 bpm range.  She is currently on long-acting diltiazem 240 mg daily.  Chest x-ray indicative of CHF.  We will begin IV diuretics this morning.  We will consult  palliative care as she is at high risk for frequent rehospitalization for the aforementioned issues.   Kate Sable, MD, [facility]  06/24/2017 9:30 AM

## 2017-06-24 NOTE — Progress Notes (Signed)
PROGRESS NOTE    [name]  XBD:[id number] DOB: 1921/06/25 DOA: 06/23/2017 PCP: Hilbert Corrigan, MD     Brief Narrative:  [name] is a 83 yo female with history of dementia, CVA, CKD stage 2, persistent A fib, CHF, AS, MR who was at her cardiology visit and was sent to the hospital due to CHF exacerbation.  Patient was evaluated by cardiology on 11/6 and was given extra dose of Lasix due to weight gain.  She was reevaluated in office on 11/12 where she had lower extremity edema.  She was subsequently admitted at [facility] with cardiology consultation for treatment for acute on chronic diastolic heart failure with IV diuresis.  Assessment & Plan:   Principal Problem:   CHF (congestive heart failure) (HCC) Active Problems:   Hyponatremia   Lewy body dementia without behavioral disturbance   Pressure injury of skin   Anemia   Hyperkalemia   Acute on chronic diastolic heart failure  -BNP 1232 on admission  -Echocardiogram pending -Cardiology following -Strict I's and O's, daily weight -Lasix IV 40mg  BID   Persistent atrial fibrillation -Continue diltiazem, Eliquis   Pressure ulcers, present on admission -Wound RN consulted   Hypothyroidism -Continue Synthroid  Hyperlipidemia -Continue Lipitor  DM type 2 with neuropathy -SSI   Severe protein calorie malnutrition -Consult dietitian  Dementia -Supportive care   Goals of care -Palliative care consult recommended by cardiology team due to patient's severely calcified aortic valve, dementia, DNR status    DVT prophylaxis: Eliquis  Code Status: DNR Family Communication: No family at bedside Disposition Plan: Pending PMT consult, diuresis, back to SNF   Consultants:   Cardiology  Palliative care medicine   Procedures:   None   Antimicrobials:  Anti-infectives (From admission, onward)   None       Subjective: Patient pleasantly confused, very poor historian, no acute physical  complaints on my examination  Objective: Vitals:   06/24/17 0700 06/24/17 0800 06/24/17 0900 06/24/17 1000  BP: 111/69 (!) 106/93 125/90 109/63  Pulse: (!) 104 (!) 121 (!) 124 (!) 123  Resp: (!) 21 20 (!) 24 (!) 28  Temp:  97.7 F (36.5 C)    TempSrc:  Oral    SpO2: 90% 91% (!) 88% 98%  Weight:      Height:       No intake or output data in the 24 hours ending 06/24/17 1221 Filed Weights   06/24/17 0037 06/24/17 0500  Weight: 63.9 kg (140 lb 14 oz) 63.9 kg (140 lb 14 oz)    Examination:  General exam: Appears calm and comfortable  Respiratory system: +Crackles. Respiratory effort normal on [place] O2  Cardiovascular system: S1 & S2 heard, Irregular rhythm, rate controlled. +3 systolic murmur. +2 pitting pedal edema. Gastrointestinal system: Abdomen is nondistended, soft and nontender. No organomegaly or masses felt. Normal bowel sounds heard. Central nervous system: Alert, nonfocal  Extremities: Symmetric  Psychiatry: +Dementia   Data Reviewed: I have personally reviewed following labs and imaging studies  CBC: Recent Labs  Lab 06/23/17 2148 06/24/17 0217  WBC 7.4 6.5  NEUTROABS 5.1  --   HGB 10.7* 11.6*  HCT 35.1* 37.8  MCV 87.3 87.5  PLT 241 834   Basic Metabolic Panel: Recent Labs  Lab 06/23/17 1945 06/24/17 0217  NA 133* 135  K 5.4* 4.2  CL 100* 95*  CO2 24 34*  GLUCOSE 115* 140*  BUN 29* 28*  CREATININE 0.85 0.78  CALCIUM 8.4* 8.9  GFR: Estimated Creatinine Clearance: 38.7 mL/min (by C-G formula based on SCr of 0.78 mg/dL). Liver Function Tests: Recent Labs  Lab 06/23/17 1945 06/24/17 0217  AST 25 23  ALT 14 12*  ALKPHOS 71 80  BILITOT 0.7 0.7  PROT 5.4* 6.1*  ALBUMIN 2.3* 2.7*   No results for input(s): LIPASE, AMYLASE in the last 168 hours. No results for input(s): AMMONIA in the last 168 hours. Coagulation Profile: No results for input(s): INR, PROTIME in the last 168 hours. Cardiac Enzymes: Recent Labs  Lab 06/23/17 1945  06/24/17 0217 06/24/17 0817  TROPONINI <0.03 <0.03 <0.03   BNP (last 3 results) No results for input(s): PROBNP in the last 8760 hours. HbA1C: No results for input(s): HGBA1C in the last 72 hours. CBG: Recent Labs  Lab 06/24/17 0819 06/24/17 1131  GLUCAP 108* 142*   Lipid Profile: No results for input(s): CHOL, HDL, LDLCALC, TRIG, CHOLHDL, LDLDIRECT in the last 72 hours. Thyroid Function Tests: Recent Labs    06/23/17 2148  TSH 4.361   Anemia Panel: No results for input(s): VITAMINB12, FOLATE, FERRITIN, TIBC, IRON, RETICCTPCT in the last 72 hours. Sepsis Labs: No results for input(s): PROCALCITON, LATICACIDVEN in the last 168 hours.  Recent Results (from the past 240 hour(s))  MRSA PCR Screening     Status: None   Collection Time: 06/24/17 12:14 AM  Result Value Ref Range Status   MRSA by PCR NEGATIVE NEGATIVE Final    Comment:        The GeneXpert MRSA Assay (FDA approved for NASAL specimens only), is one component of a comprehensive MRSA colonization surveillance program. It is not intended to diagnose MRSA infection nor to guide or monitor treatment for MRSA infections.        Radiology Studies: Dg Chest 2 View  Result Date: 06/23/2017 CLINICAL DATA:  Patient is AMS. Notes state she was sent over to ED from cardiology. Hx of a-fib, ASCVD, bronchitis, carcinoma of breast, CHF, diabetes, HTN, , mitral valve prolapse, left mastectomy, cardiac surgery. EXAM: CHEST  2 VIEW COMPARISON:  05/15/2017 FINDINGS: Moderate enlargement of the cardiopericardial silhouette. No convincing mediastinal or hilar masses. There is vascular congestion with interstitial thickening. Moderate pleural effusions obscure the hemidiaphragms. These findings are similar to the prior exam. Right PICC has been removed. No pneumothorax. IMPRESSION: 1. Congestive heart failure reflected by cardiomegaly and interstitial pulmonary edema and moderate bilateral pleural effusions. Electronically  Signed   By: Lajean Manes M.D.   On: 06/23/2017 19:03      Scheduled Meds: . apixaban  2.5 mg Oral BID  . atorvastatin  20 mg Oral Daily  . diltiazem  240 mg Oral Daily  . feeding supplement (PRO-STAT SUGAR FREE 64)  30 mL Oral TID WC  . feeding supplement (PRO-STAT SUGAR FREE 64)  30 mL Oral BID  . furosemide  40 mg Intravenous Q12H  . gabapentin  200 mg Oral Q8H  . insulin aspart  0-5 Units Subcutaneous QHS  . insulin aspart  0-9 Units Subcutaneous TID WC  . levothyroxine  25 mcg Oral QPM  . mirtazapine  7.5 mg Oral QHS  . polyethylene glycol  17 g Oral Daily  . sodium chloride flush  3 mL Intravenous Q12H   Continuous Infusions: . sodium chloride       LOS: 1 day    Time spent: 40 minutes   Dessa Phi, DO [facility] [email or web address] Password [email or web address] 06/24/2017, 12:21 PM

## 2017-06-24 NOTE — Progress Notes (Signed)
*  PRELIMINARY RESULTS* Echocardiogram 2D Echocardiogram has been performed.  Samuel Germany 06/24/2017, 10:33 AM

## 2017-06-24 NOTE — Progress Notes (Signed)
Initial Nutrition Assessment  DOCUMENTATION CODES:  Severe malnutrition in context of chronic illness  INTERVENTION:  Whole milk with meals (+24 g Pro)  Magic cup BID with meals, each supplement provides 290 kcal and 9 grams of protein  Liquid MVI with minerals  Juven modular to provide glutamine, Arginine and HMB for promote wound healing/granulation   Continue Prostat TID, each supplement provides 100 kcal and 15 grams of protein.  NUTRITION DIAGNOSIS:  Severe Malnutrition related to poor appetite, lethargy/confusion, chronic illness(Worsening dementia likely affecting appetite) as evidenced by severe muscle/fat depletion  GOAL:  Patient will meet greater than or equal to 90% of their needs  MONITOR:  PO intake, Supplement acceptance, Labs, Weight trends, Skin  REASON FOR ASSESSMENT:  Consult Assessment of nutrition requirement/status  ASSESSMENT:  83 y/o female PMhx Lewy Body dementia, CVA, CKDII, A fib, CHF, CAD, AS, MR, Chronic WOunds, SNF resident. Admitted for IV diuresis from outpatient cardiology appointment due to lower extremity edema in the setting of acute on chronic HF.   Spoke with patient and son. Son states the patient does not eat much at the SNF.  Son is unaware if the patient was receiving any vitamins at the SNF. He comments they give her Ensure/Boost there, but she does not drink much because it is too sweet. He does not think she has ever had Glucerna. He says that the patient is typically unable to feed herself, however, occasionally she will. He denies the patient having any issues chewing/swallowing if the meats and larger food items are cut up.   The patient herself denies any n/v/c/d.   Son says the patients UBW is ~130 lbs. She was admitted at 140 lbs, but is fluid overloaded at the moment. Has swelling to BLE. Per chart, her UBW appears to be slightly less, roughly 115-125 lbs. She had largely remained in this range from mid 2016 up until this past  July. Given her fluid overloaded status, will use 120 as dosing weight.   Son notes the patient ate ~1/3 of her lunch tray. RD asked patient what food she likes. She comments that she has asked for milk at nightime, but has not received it. Apparently, she drinks milk quite frequently. Will order. She also says she really enjoys ice cream. Will order Magic cups for extra protein. Continue prostat Add mvi with minerals for wound healing.   Physical Exam: Displays severe muscle wasting of temporalis, clavicular musculature, deltoids. Displays severe fat wasting of Thorax/underarm reserves.   Labs BGs: 108-142, Albumin: 2.8, BUN/Creat:28/.78 Meds: Prostat TID, Lasix, miralax, remeron   Recent Labs  Lab 06/23/17 1945 06/24/17 0217  NA 133* 135  K 5.4* 4.2  CL 100* 95*  CO2 24 34*  BUN 29* 28*  CREATININE 0.85 0.78  CALCIUM 8.4* 8.9  GLUCOSE 115* 140*    NUTRITION - FOCUSED PHYSICAL EXAM:   Most Recent Value  Orbital Region  Moderate depletion  Upper Arm Region  Severe depletion  Thoracic and Lumbar Region  Severe depletion  Buccal Region  Moderate depletion  Temple Region  Severe depletion  Clavicle Bone Region  Severe depletion  Clavicle and Acromion Bone Region  Severe depletion  Scapular Bone Region  Unable to assess  Dorsal Hand  Unable to assess  Patellar Region  Unable to assess  Anterior Thigh Region  Unable to assess  Posterior Calf Region  Unable to assess  Edema (RD Assessment)  Moderate       Diet Order:  Diet heart  healthy/carb modified Room service appropriate? Yes; Fluid consistency: Thin  EDUCATION NEEDS:  No education needs have been identified at this time  Skin: DTI to sacrum, Unstageable, full thickness PU to L heel , Wound to L arm,   Last BM:  Unknown  Height:  Ht Readings from Last 1 Encounters:  06/24/17 5\' 3"  (1.6 m)   Weight:  Wt Readings from Last 1 Encounters:  06/24/17 140 lb 14 oz (63.9 kg)   Wt Readings from Last 10 Encounters:   06/24/17 140 lb 14 oz (63.9 kg)  05/19/17 138 lb 14.2 oz (63 kg)  05/05/17 121 lb (54.9 kg)  03/07/17 123 lb 7.3 oz (56 kg)  03/03/17 116 lb (52.6 kg)  02/19/17 125 lb 10.6 oz (57 kg)  01/30/17 121 lb (54.9 kg)  12/27/16 114 lb (51.7 kg)  12/19/16 118 lb (53.5 kg)  12/10/16 119 lb (54 kg)   Ideal Body Weight:  52.27 kg  BMI:  Body mass index is 24.95 kg/m.  Dosing Weight: 120 lbs (54.54 kg) Estimated Nutritional Needs:  Kcal:  1650-1850 (30-34 kcal/kg bw) Protein:  75-90g Pro (1.4-1.6 g/kg bw) Fluid:  Per MD  Burtis Junes RD, LDN, CNSC Clinical Nutrition Pager: [phone] 06/24/2017 12:58 PM

## 2017-06-25 DIAGNOSIS — L89154 Pressure ulcer of sacral region, stage 4: Secondary | ICD-10-CM

## 2017-06-25 DIAGNOSIS — E039 Hypothyroidism, unspecified: Secondary | ICD-10-CM

## 2017-06-25 LAB — BASIC METABOLIC PANEL
Anion gap: 5 (ref 5–15)
BUN: 30 mg/dL — ABNORMAL HIGH (ref 6–20)
CALCIUM: 8.2 mg/dL — AB (ref 8.9–10.3)
CHLORIDE: 94 mmol/L — AB (ref 101–111)
CO2: 36 mmol/L — ABNORMAL HIGH (ref 22–32)
CREATININE: 0.63 mg/dL (ref 0.44–1.00)
GFR calc non Af Amer: >60 mL/min (ref >60)
Glucose, Bld: 81 mg/dL (ref 65–99)
Potassium: 3.4 mmol/L — ABNORMAL LOW (ref 3.5–5.1)
SODIUM: 135 mmol/L (ref 135–145)

## 2017-06-25 LAB — CBC
HCT: 32.2 % — ABNORMAL LOW (ref 36.0–46.0)
HEMOGLOBIN: 10 g/dL — AB (ref 12.0–15.0)
MCH: 26.9 pg (ref 26.0–34.0)
MCHC: 31.1 g/dL (ref 30.0–36.0)
MCV: 86.6 fL (ref 78.0–100.0)
Platelets: 231 10*3/uL (ref 150–400)
RBC: 3.72 MIL/uL — ABNORMAL LOW (ref 3.87–5.11)
RDW: 17.9 % — AB (ref 11.5–15.5)
WBC: 5.2 10*3/uL (ref 4.0–10.5)

## 2017-06-25 LAB — GLUCOSE, CAPILLARY
GLUCOSE-CAPILLARY: 60 mg/dL — AB (ref 65–99)
Glucose-Capillary: 81 mg/dL (ref 65–99)
Glucose-Capillary: 97 mg/dL (ref 65–99)
Glucose-Capillary: 105 mg/dL — ABNORMAL HIGH (ref 65–99)
Glucose-Capillary: 112 mg/dL — ABNORMAL HIGH (ref 65–99)

## 2017-06-25 MED ORDER — FUROSEMIDE 10 MG/ML IJ SOLN
80.0000 mg | Freq: Two times a day (BID) | INTRAMUSCULAR | Status: DC
  Administered 2017-06-25 – 2017-06-27 (×4): 80 mg via INTRAVENOUS
  Filled 2017-06-25 (×5): qty 8

## 2017-06-25 NOTE — Progress Notes (Signed)
PROGRESS NOTE    [name]  MVE:[id number] DOB: October 06, 1920 DOA: 06/23/2017 PCP: Hilbert Corrigan, MD     Brief Narrative:  [name] is a 83 yo female with history of dementia, CVA, CKD stage 2, persistent A fib, CHF, AS, MR who was at her cardiology visit and was sent to the hospital due to CHF exacerbation.  Patient was evaluated by cardiology on 11/6 and was given extra dose of Lasix due to weight gain.  She was reevaluated in office on 11/12 where she had lower extremity edema.  She was subsequently admitted at [facility] with cardiology consultation for treatment for acute on chronic diastolic heart failure with IV diuresis.  Assessment & Plan:   Principal Problem:   CHF (congestive heart failure) (HCC) Active Problems:   Hyponatremia   Lewy body dementia without behavioral disturbance   Pressure injury of skin   Anemia   Hyperkalemia   Encounter for hospice care discussion   Acute on chronic diastolic heart failure  -BNP 1232 on admission  -Echocardiogram shows preserved ejection fraction -Strict I's and O's, daily weight -Urine output is not been impressive.  Increase Lasix to 80 mg twice daily -Since patient is unable to stand due to generalized weakness and lower extremity wounds, weights are obtained from bed scale.  These may be inaccurate.  Persistent atrial fibrillation -Continue diltiazem, Eliquis   Pressure ulcers, present on admission -Wound RN consulted   Hypothyroidism -Continue Synthroid  Hyperlipidemia -Continue Lipitor  DM type 2 with neuropathy -SSI   Severe protein calorie malnutrition -Consult dietitian  Dementia -Supportive care   Goals of care -Palliative care consult recommended by cardiology team due to patient's severely calcified aortic valve, dementia, DNR status.  After meeting with son, patient will likely return to nursing home with hospice services to follow.     DVT prophylaxis: Eliquis  Code Status:  DNR Family Communication: No family at bedside Disposition Plan: Return to nursing home with hospice services when she has sufficiently diuresed   Consultants:   Cardiology  Palliative care medicine   Procedures:  Echo: - Left ventricle: The cavity size was normal. Wall thickness was   increased in a pattern of moderate LVH. Systolic function was   vigorous. The estimated ejection fraction was in the range of 65%   to 70%. Wall motion was normal; there were no regional wall   motion abnormalities. The study was not technically sufficient to   allow evaluation of LV diastolic dysfunction due to atrial   fibrillation. Doppler parameters are consistent with high   ventricular filling pressure. - Ventricular septum: The contour showed systolic flattening. These   changes are consistent with RV pressure overload. - Aortic valve: Moderately thickened, moderately calcified   leaflets. There was moderate stenosis. Valve area (VTI): 1.18   cm^2. Valve area (Vmax): 0.88 cm^2. Valve area (Vmean): 0.94   cm^2. - Mitral valve: Severely calcified, moderately thickened annulus.   Mildly thickened leaflets . There was moderate to severe   regurgitation. The jet is eccentric and the severity may be   underestimated, particularly when accounting for left atrial   volume. - Left atrium: The atrium was massively dilated. - Right ventricle: The cavity size was moderately dilated. Wall   thickness was normal. Systolic function was mildly reduced. - Right atrium: The atrium was massively dilated. - Tricuspid valve: There was moderate regurgitation. - Pulmonary arteries: PA peak pressure: 70 mm Hg (S). - Inferior vena cava: IVC  is normal in caliber with diminished    respiratory variation, indicative of elevated CVP (8 mmHg).  Antimicrobials:  Anti-infectives (From admission, onward)   None       Subjective: Patient is confused.  She does not know that she is in the  hospital.  Objective: Vitals:   06/24/17 2001 06/24/17 2200 06/25/17 0515 06/25/17 1500  BP:  118/64 120/60 93/61  Pulse:  72 80 (!) 55  Resp:  20 20 18   Temp:  98 F (36.7 C) 98.1 F (36.7 C) (!) 97.5 F (36.4 C)  TempSrc:  Oral Oral Oral  SpO2: 96% 96% 97% 99%  Weight:   66.7 kg (147 lb)   Height:        Intake/Output Summary (Last 24 hours) at 06/25/2017 1902 Last data filed at 06/25/2017 1843 Gross per 24 hour  Intake 1083 ml  Output 950 ml  Net 133 ml   Filed Weights   06/24/17 0037 06/24/17 0500 06/25/17 0515  Weight: 63.9 kg (140 lb 14 oz) 63.9 kg (140 lb 14 oz) 66.7 kg (147 lb)    Examination:  General exam: Appears calm and comfortable  Respiratory system: +Crackles at bases. Respiratory effort normal on [place] O2  Cardiovascular system: S1 & S2 heard, Irregular rhythm, rate controlled. +3 systolic murmur. +1 pitting pedal edema. Gastrointestinal system: Abdomen is nondistended, soft and nontender. No organomegaly or masses felt. Normal bowel sounds heard. Central nervous system: Alert, nonfocal  Extremities: Symmetric  Psychiatry: +Dementia   Data Reviewed: I have personally reviewed following labs and imaging studies  CBC: Recent Labs  Lab 06/23/17 2148 06/24/17 0217 06/25/17 0524  WBC 7.4 6.5 5.2  NEUTROABS 5.1  --   --   HGB 10.7* 11.6* 10.0*  HCT 35.1* 37.8 32.2*  MCV 87.3 87.5 86.6  PLT 241 213 400   Basic Metabolic Panel: Recent Labs  Lab 06/23/17 1945 06/24/17 0217 06/25/17 0524  NA 133* 135 135  K 5.4* 4.2 3.4*  CL 100* 95* 94*  CO2 24 34* 36*  GLUCOSE 115* 140* 81  BUN 29* 28* 30*  CREATININE 0.85 0.78 0.63  CALCIUM 8.4* 8.9 8.2*   GFR: Estimated Creatinine Clearance: 39.4 mL/min (by C-G formula based on SCr of 0.63 mg/dL). Liver Function Tests: Recent Labs  Lab 06/23/17 1945 06/24/17 0217  AST 25 23  ALT 14 12*  ALKPHOS 71 80  BILITOT 0.7 0.7  PROT 5.4* 6.1*  ALBUMIN 2.3* 2.7*   No results for input(s): LIPASE, AMYLASE  in the last 168 hours. No results for input(s): AMMONIA in the last 168 hours. Coagulation Profile: No results for input(s): INR, PROTIME in the last 168 hours. Cardiac Enzymes: Recent Labs  Lab 06/23/17 1945 06/24/17 0217 06/24/17 0817  TROPONINI <0.03 <0.03 <0.03   BNP (last 3 results) No results for input(s): PROBNP in the last 8760 hours. HbA1C: Recent Labs    06/24/17 0217  HGBA1C 6.3*   CBG: Recent Labs  Lab 06/24/17 2210 06/25/17 0746 06/25/17 0835 06/25/17 1129 06/25/17 1607  GLUCAP 126* 60* 81 97 105*   Lipid Profile: No results for input(s): CHOL, HDL, LDLCALC, TRIG, CHOLHDL, LDLDIRECT in the last 72 hours. Thyroid Function Tests: Recent Labs    06/23/17 2148  TSH 4.361   Anemia Panel: No results for input(s): VITAMINB12, FOLATE, FERRITIN, TIBC, IRON, RETICCTPCT in the last 72 hours. Sepsis Labs: No results for input(s): PROCALCITON, LATICACIDVEN in the last 168 hours.  Recent Results (from the past 240 hour(s))  MRSA PCR Screening     Status: None   Collection Time: 06/24/17 12:14 AM  Result Value Ref Range Status   MRSA by PCR NEGATIVE NEGATIVE Final    Comment:        The GeneXpert MRSA Assay (FDA approved for NASAL specimens only), is one component of a comprehensive MRSA colonization surveillance program. It is not intended to diagnose MRSA infection nor to guide or monitor treatment for MRSA infections.        Radiology Studies: No results found.    Scheduled Meds: . apixaban  2.5 mg Oral BID  . atorvastatin  20 mg Oral Daily  . diltiazem  240 mg Oral Daily  . feeding supplement (PRO-STAT SUGAR FREE 64)  30 mL Oral TID WC  . furosemide  80 mg Intravenous Q12H  . gabapentin  200 mg Oral Q8H  . insulin aspart  0-5 Units Subcutaneous QHS  . insulin aspart  0-9 Units Subcutaneous TID WC  . levothyroxine  25 mcg Oral QPM  . mirtazapine  7.5 mg Oral QHS  . multivitamin  15 mL Oral Daily  . nutrition supplement (JUVEN)  1 packet  Oral BID BM  . polyethylene glycol  17 g Oral Daily  . sodium chloride flush  3 mL Intravenous Q12H   Continuous Infusions: . sodium chloride       LOS: 2 days    Time spent: 30 minutes   MEMON,JEHANZEB, MD [facility] [email or web address] Password [email or web address] 06/25/2017, 7:02 PM

## 2017-06-25 NOTE — Progress Notes (Signed)
Daily Progress Note   Patient Name: [name]       Date: 06/25/2017 DOB: [birth year]  Age: 83 y.o. MRN#: [id number] Attending Physician: Kathie Dike, MD Primary Care Physician: Hilbert Corrigan, MD Admit Date: 06/23/2017  Reason for Consultation/Follow-up: Establishing goals of care and Hospice Evaluation  Subjective: Call from [name] staff stating that they received a phone call from Mr. [name] stating that his brother will not be able to make our schedule meeting, and [name] wants to "proceed with the plan".  He asks my coworker if I will be seeing his mother at the nursing home.  She shares that I will not, as PMT is only in the hospital.    [name] is resting quietly so her son and I talked in the hallway.  We talked about Magic cup and protein supplements.  We talked about returning to [place] with the benefits of [facility] hospice.  I share that I will contact the [facility] representative, asking for him to reach out to [place].  At this point, can again asks if I am coming to see his mother at [facility].  It is explained to can for the third time in less than 24 hours that this NP, PMT does not see patients outside of the hospital.  I am concerned that [name] lacks the ability to be responsible party for his mother.  Unfortunately, I have been unable to reach his brother, [name].  We talked about the MOST form.  [name] states, "I thought we completed that".  I share that the facility would have since this form with his mother if we had.  I talk about the MOST form, including would she want a tube through the skin of her stomach to feed her.  [name] states definitely no to PEG tube.   I talk about [name] declines.  She has been here 4 times in the last 6 months, and we are not changing what is  happening.  He says "the last 12 months?",  I said, no 4 times in 6 months.  He gets an excited, happy to look on his face and he says, "yeah,  but she's still here".   I respond "yes, with bedsores. She has sacral bed sore and bilateral heel sores and they hurt".  He states "no", I  reply that bedsores hurt. I remind [name] that his mother was complaining of the leg pain yesterday, related to her bedsores. I share that we will continue to care for her, but to consider focusing on comfort.   [name] again today mentions that [name] has swelling in her arm.  I remind him that happened last time, and swelling went from arm to arm.  We again talked about fluid balance and low albumin.  Called to [name] hospice representative.  Case discussed in detail.  Call to [name], representative at [facility], case discussed in detail.  Length of Stay: 2  Current Medications: Scheduled Meds:  . apixaban  2.5 mg Oral BID  . atorvastatin  20 mg Oral Daily  . diltiazem  240 mg Oral Daily  . feeding supplement (PRO-STAT SUGAR FREE 64)  30 mL Oral TID WC  . furosemide  40 mg Intravenous Q12H  . gabapentin  200 mg Oral Q8H  . insulin aspart  0-5 Units Subcutaneous QHS  . insulin aspart  0-9 Units Subcutaneous TID WC  . levothyroxine  25 mcg Oral QPM  . mirtazapine  7.5 mg Oral QHS  . multivitamin  15 mL Oral Daily  . nutrition supplement (JUVEN)  1 packet Oral BID BM  . polyethylene glycol  17 g Oral Daily  . sodium chloride flush  3 mL Intravenous Q12H    Continuous Infusions: . sodium chloride      PRN Meds: sodium chloride, acetaminophen **OR** acetaminophen, HYDROcodone-acetaminophen, ondansetron (ZOFRAN) IV, sodium chloride flush, sodium phosphate  Physical Exam  Constitutional: No distress.  HENT:  Head: Atraumatic.  Cardiovascular: Normal rate.  Pulmonary/Chest: Effort normal. No respiratory distress.  Abdominal: Soft. She exhibits no distension.  Musculoskeletal: She exhibits no edema.    Neurological: She is alert.  Skin: Skin is warm and dry.  Sacral wound, bilateral heel wounds, present on admit  Nursing note and vitals reviewed.           Vital Signs: BP 120/60 (BP Location: Right Arm)   Pulse 80   Temp 98.1 F (36.7 C) (Oral)   Resp 20   Ht 5\' 3"  (1.6 m)   Wt 66.7 kg (147 lb)   SpO2 97%   BMI 26.04 kg/m  SpO2: SpO2: 97 % O2 Device: O2 Device: Nasal Cannula O2 Flow Rate: O2 Flow Rate (L/min): 3 L/min  Intake/output summary:   Intake/Output Summary (Last 24 hours) at 06/25/2017 1316 Last data filed at 06/25/2017 0900 Gross per 24 hour  Intake 843 ml  Output 700 ml  Net 143 ml   LBM: Last BM Date: 06/24/17 Baseline Weight: Weight: 63.9 kg (140 lb 14 oz) Most recent weight: Weight: 66.7 kg (147 lb)       Palliative Assessment/Data:    Flowsheet Rows     Most Recent Value  Intake Tab  Referral Department  Hospitalist  Unit at Time of Referral  ICU  Palliative Care Primary Diagnosis  Cardiac  Date Notified  06/24/17  Palliative Care Type  Return patient Palliative Care  Reason for referral  Clarify Goals of Care  Date of Admission  06/23/17  Date first seen by Palliative Care  06/24/17  # of days Palliative referral response time  0 Day(s)  # of days IP prior to Palliative referral  1  Clinical Assessment  Palliative Performance Scale Score  30%  Pain Max last 24 hours  Not able to report  Pain Min Last 24 hours  Not able to  report  Dyspnea Max Last 24 Hours  Not able to report  Dyspnea Min Last 24 hours  Not able to report  Psychosocial & Spiritual Assessment  Palliative Care Outcomes  Patient/Family meeting held?  Yes  Who was at the meeting?  Patient and son [name] at bedside  Patient/Family wishes: Interventions discontinued/not started   Mechanical Ventilation      Patient Active Problem List   Diagnosis Date Noted  . Encounter for hospice care discussion   . CHF (congestive heart failure) ([place]) 06/23/2017  . Anemia 06/23/2017   . Hyperkalemia 06/23/2017  . Goals of care, counseling/discussion   . Palliative care encounter   . Bleeding from PICC line, sequela   . Acute on chronic congestive heart failure ([place])   . Anasarca 05/05/2017  . Severe protein-calorie malnutrition ([place]) 05/05/2017  . AKI (acute kidney injury) ([place]) 03/07/2017  . Cellulitis 03/07/2017  . Pressure injury of skin 02/18/2017  . Acute encephalopathy 02/18/2017  . Atrial fibrillation, rapid ([place]) 02/18/2017  . Permanent atrial fibrillation ([place]) 12/27/2016  . CAD in native artery 12/27/2016  . SIRS (systemic inflammatory response syndrome) ([place]) 12/10/2016  . Elevated troponin 12/10/2016  . SOB (shortness of breath)   . Lewy body dementia without behavioral disturbance   . Acute hypoxemic respiratory failure ([place]) 03/18/2016  . Hyponatremia 03/18/2016  . Abdominal distention 04/13/2014  . Hip pain 04/12/2014  . Labral tear of hip, degenerative 04/12/2014  . Trochanteric bursitis of right hip 04/12/2014  . Encounter for therapeutic drug monitoring 10/06/2013  . Lower extremity edema 08/20/2011  . Arteriosclerotic cardiovascular disease (ASCVD)   . Carcinoma of breast ([place])   . Atrial fibrillation with RVR ([place])   . MVP (mitral valve prolapse)   . PVD (peripheral vascular disease) ([place])   . CVD (cerebrovascular disease)   . Thyroid activity decreased   . Osteoporosis   . Chronic anticoagulation 11/22/2010  . Type 2 diabetes mellitus ([place]) 11/21/2009  . Hyperlipidemia 11/21/2009  . Hypertension 11/21/2009  . Acute on chronic diastolic heart failure ([place]) 11/21/2009  . SPINAL STENOSIS, LUMBAR 09/05/2008    Palliative Care Assessment & Plan   Patient Profile: 83 y.o. female  with past medical history of history of CVA, stage II kidney disease, A. fib, CAD, CHF, dementia, resident of skilled nursing facility 3-4 years, admitted on 06/23/2017 with CHF, failure to thrive.   Assessment: CHF, failure to thrive: At this point  relatively stable fluid balance, -99 yesterday, 50% of breakfast consumed.  Goal is to return to residential SNF with hospice.  We discussed but did not complete the MOST form.  Recommendations/Plan:  At this point, continue to treat the treatable but no CPR, no intubation.  Return to [place], residential SNF with the benefits of [facility] hosp[facility].  Continue "do not re-hospitalize" discussions.  Goals of Care and Additional Recommendations:  Limitations on Scope of Treatment: Treat the treatable but no CPR, no intubation, no PEG tube  Code Status:    Code Status Orders  (From admission, onward)        Start     Ordered   06/24/17 0014  Do not attempt resuscitation (DNR)  Continuous    Question Answer Comment  In the event of cardiac or respiratory ARREST Do not call a "code blue"   In the event of cardiac or respiratory ARREST Do not perform Intubation, CPR, defibrillation or ACLS   In the event of cardiac or respiratory ARREST Use medication by any route, position, wound  care, and other measures to relive pain and suffering. May use oxygen, suction and manual treatment of airway obstruction as needed for comfort.      06/24/17 0013    Code Status History    Date Active Date Inactive Code Status Order ID Comments User Context   05/05/2017 18:59 05/19/2017 19:34 DNR [id number]  Karmen Bongo, MD Inpatient   03/09/2017 14:24 03/09/2017 22:36 DNR [id number]  Kathie Dike, MD Inpatient   03/07/2017 21:05 03/09/2017 14:24 Partial Code [id number]  Oswald Hillock, MD Inpatient   02/17/2017 20:37 02/19/2017 20:20 Partial Code [id number]  Reubin Milan, MD Inpatient   12/10/2016 21:08 12/11/2016 18:39 Full Code [id number]  Vianne Bulls, MD ED   09/27/2016 22:21 09/30/2016 15:20 Full Code [id number]  Reubin Milan, MD Inpatient   03/18/2016 00:02 03/21/2016 19:24 Full Code [id number]  Bonnell Public, MD Inpatient   04/12/2014 12:51 04/13/2014 22:25 DNR [id number]  Radene Gunning, NP Inpatient    08/20/2011 16:55 08/22/2011 18:47 Full Code [id number]  Rexene Alberts, MD Inpatient       Prognosis:   < 3 months would not be surprising based on frailty, continue functional decline, 4 hospitalizations in 6 months, continued low albumin of 2.3 on admit.  Discharge Planning:  When stable, return to [place] residential SNF with the benefits of [facility] hosp[facility].  Care plan was discussed with nursing staff, case management, social work, and Dr. Roderic Palau.  Thank you for allowing the Palliative Medicine Team to assist in the care of this patient.   Time In: 1125 Time Out: 1205 Total Time 40 minutes Prolonged Time Billed  no       Greater than 50%  of this time was spent counseling and coordinating care related to the above assessment and plan.  Drue Novel, NP  Please contact Palliative Medicine Team phone at [phone] for questions and concerns.

## 2017-06-26 LAB — BASIC METABOLIC PANEL
ANION GAP: 10 (ref 5–15)
Anion gap: 6 (ref 5–15)
BUN: 33 mg/dL — ABNORMAL HIGH (ref 6–20)
BUN: 35 mg/dL — ABNORMAL HIGH (ref 6–20)
CALCIUM: 8.3 mg/dL — AB (ref 8.9–10.3)
CHLORIDE: 96 mmol/L — AB (ref 101–111)
CO2: 37 mmol/L — AB (ref 22–32)
CO2: 32 mmol/L (ref 22–32)
CREATININE: 0.67 mg/dL (ref 0.44–1.00)
Calcium: 8.2 mg/dL — ABNORMAL LOW (ref 8.9–10.3)
Chloride: 94 mmol/L — ABNORMAL LOW (ref 101–111)
Creatinine, Ser: 0.71 mg/dL (ref 0.44–1.00)
GFR calc non Af Amer: >60 mL/min (ref >60)
GFR calc non Af Amer: >60 mL/min (ref >60)
GLUCOSE: 140 mg/dL — AB (ref 65–99)
Glucose, Bld: 189 mg/dL — ABNORMAL HIGH (ref 65–99)
POTASSIUM: 3.4 mmol/L — AB (ref 3.5–5.1)
Potassium: 3.2 mmol/L — ABNORMAL LOW (ref 3.5–5.1)
SODIUM: 138 mmol/L (ref 135–145)
Sodium: 137 mmol/L (ref 135–145)

## 2017-06-26 LAB — GLUCOSE, CAPILLARY
Glucose-Capillary: 117 mg/dL — ABNORMAL HIGH (ref 65–99)
Glucose-Capillary: 193 mg/dL — ABNORMAL HIGH (ref 65–99)
Glucose-Capillary: 163 mg/dL — ABNORMAL HIGH (ref 65–99)

## 2017-06-26 LAB — MAGNESIUM: MAGNESIUM: 2 mg/dL (ref 1.7–2.4)

## 2017-06-26 MED ORDER — POTASSIUM CHLORIDE CRYS ER 20 MEQ PO TBCR
40.0000 meq | EXTENDED_RELEASE_TABLET | ORAL | Status: AC
  Administered 2017-06-26 (×2): 40 meq via ORAL
  Filled 2017-06-26 (×2): qty 2

## 2017-06-26 MED ORDER — DILTIAZEM HCL 25 MG/5ML IV SOLN
10.0000 mg | Freq: Once | INTRAVENOUS | Status: AC
  Administered 2017-06-26: 10 mg via INTRAVENOUS
  Filled 2017-06-26: qty 5

## 2017-06-26 NOTE — Progress Notes (Signed)
Pts HR increased to 140 to 150s AFIB, Dr. Hal Hope paged and made aware. Waiting for orders/call back.

## 2017-06-26 NOTE — Progress Notes (Signed)
After speaking with Dr. Hal Hope, new order for Cardizem bolus x1 now. Given Pt had 45 beat run of Vtach, Dr. Hal Hope paged again and made aware. Pt asymptomatic, but continuously calling out asking for water and stating her leg hurts. Pt given water and pain medication. Will continue to monitor pt Waiting for orders/ call back.

## 2017-06-26 NOTE — Progress Notes (Signed)
After speaking with Dr. Hal Hope about pts episode of Vtach, new order for STAT BMP and Magnesium. Pt educated.  Lab made aware of new order. Will continue to monitor pt

## 2017-06-26 NOTE — Progress Notes (Signed)
Daily Progress Note   Patient Name: [name]       Date: 06/26/2017 DOB: Sep 27, 1920  Age: 83 y.o. MRN#: [id number] Attending Physician: Kathie Dike, MD Primary Care Physician: Hilbert Corrigan, MD Admit Date: 06/23/2017  Reason for Consultation/Follow-up: Establishing goals of care and Psychosocial/spiritual support  Subjective: [name] is resting quietly in bed.  She looks at me when I enter.  She is calm cooperative, pleasant, but with known dementia.  There is no family at bedside at this time.  Her meal tray is in front of her, and it seems that she is eaten very little, but tells me she is satisfied.  She does take a sip of tea, and a sip of water.  No overt signs and symptoms of aspiration at this time.  She has no complaints or questions, will continue to follow patient.  Length of Stay: 3  Current Medications: Scheduled Meds:  . apixaban  2.5 mg Oral BID  . atorvastatin  20 mg Oral Daily  . diltiazem  240 mg Oral Daily  . feeding supplement (PRO-STAT SUGAR FREE 64)  30 mL Oral TID WC  . furosemide  80 mg Intravenous Q12H  . gabapentin  200 mg Oral Q8H  . insulin aspart  0-5 Units Subcutaneous QHS  . insulin aspart  0-9 Units Subcutaneous TID WC  . levothyroxine  25 mcg Oral QPM  . mirtazapine  7.5 mg Oral QHS  . multivitamin  15 mL Oral Daily  . nutrition supplement (JUVEN)  1 packet Oral BID BM  . polyethylene glycol  17 g Oral Daily  . sodium chloride flush  3 mL Intravenous Q12H    Continuous Infusions: . sodium chloride      PRN Meds: sodium chloride, acetaminophen **OR** acetaminophen, HYDROcodone-acetaminophen, ondansetron (ZOFRAN) IV, sodium chloride flush, sodium phosphate  Physical Exam  Constitutional: She is oriented to person, place, and  time. No distress.  HENT:  Head: Atraumatic.  Cardiovascular: Normal rate.  Pulmonary/Chest: Effort normal. No respiratory distress.  Abdominal: Soft. She exhibits no distension.  Neurological: She is alert and oriented to person, place, and time.  Skin: Skin is warm and dry.  Nursing note and vitals reviewed.           Vital Signs: BP (!) 114/52 (BP Location: Right Arm)  Pulse 87   Temp 98.3 F (36.8 C) (Oral)   Resp 15   Ht 5\' 3"  (1.6 m)   Wt 66.9 kg (147 lb 7.8 oz)   SpO2 99%   BMI 26.13 kg/m  SpO2: SpO2: 99 % O2 Device: O2 Device: Nasal Cannula O2 Flow Rate: O2 Flow Rate (L/min): 3 L/min  Intake/output summary:   Intake/Output Summary (Last 24 hours) at 06/26/2017 1458 Last data filed at 06/26/2017 0500 Gross per 24 hour  Intake 820 ml  Output 2050 ml  Net -1230 ml   LBM: Last BM Date: 06/24/17 Baseline Weight: Weight: 63.9 kg (140 lb 14 oz) Most recent weight: Weight: 66.9 kg (147 lb 7.8 oz)       Palliative Assessment/Data:    Flowsheet Rows     Most Recent Value  Intake Tab  Referral Department  Hospitalist  Unit at Time of Referral  ICU  Palliative Care Primary Diagnosis  Cardiac  Date Notified  06/24/17  Palliative Care Type  Return patient Palliative Care  Reason for referral  Clarify Goals of Care  Date of Admission  06/23/17  Date first seen by Palliative Care  06/24/17  # of days Palliative referral response time  0 Day(s)  # of days IP prior to Palliative referral  1  Clinical Assessment  Palliative Performance Scale Score  30%  Pain Max last 24 hours  Not able to report  Pain Min Last 24 hours  Not able to report  Dyspnea Max Last 24 Hours  Not able to report  Dyspnea Min Last 24 hours  Not able to report  Psychosocial & Spiritual Assessment  Palliative Care Outcomes  Patient/Family meeting held?  Yes  Who was at the meeting?  Patient and son Yvone Neu at bedside  Patient/Family wishes: Interventions discontinued/not started   Mechanical  Ventilation      Patient Active Problem List   Diagnosis Date Noted  . Encounter for hospice care discussion   . CHF (congestive heart failure) ([place]) 06/23/2017  . Anemia 06/23/2017  . Hyperkalemia 06/23/2017  . Goals of care, counseling/discussion   . Palliative care encounter   . Bleeding from PICC line, sequela   . Acute on chronic congestive heart failure ([place])   . Anasarca 05/05/2017  . Severe protein-calorie malnutrition ([place]) 05/05/2017  . AKI (acute kidney injury) ([place]) 03/07/2017  . Cellulitis 03/07/2017  . Pressure injury of skin 02/18/2017  . Acute encephalopathy 02/18/2017  . Atrial fibrillation, rapid ([place]) 02/18/2017  . Permanent atrial fibrillation ([place]) 12/27/2016  . CAD in native artery 12/27/2016  . SIRS (systemic inflammatory response syndrome) ([place]) 12/10/2016  . Elevated troponin 12/10/2016  . SOB (shortness of breath)   . Lewy body dementia without behavioral disturbance   . Acute hypoxemic respiratory failure ([place]) 03/18/2016  . Hyponatremia 03/18/2016  . Abdominal distention 04/13/2014  . Hip pain 04/12/2014  . Labral tear of hip, degenerative 04/12/2014  . Trochanteric bursitis of right hip 04/12/2014  . Encounter for therapeutic drug monitoring 10/06/2013  . Lower extremity edema 08/20/2011  . Arteriosclerotic cardiovascular disease (ASCVD)   . Carcinoma of breast ([place])   . Atrial fibrillation with RVR ([place])   . MVP (mitral valve prolapse)   . PVD (peripheral vascular disease) ([place])   . CVD (cerebrovascular disease)   . Thyroid activity decreased   . Osteoporosis   . Chronic anticoagulation 11/22/2010  . Type 2 diabetes mellitus ([place]) 11/21/2009  . Hyperlipidemia 11/21/2009  . Hypertension 11/21/2009  . Acute  on chronic diastolic heart failure ([place]) 11/21/2009  . SPINAL STENOSIS, LUMBAR 09/05/2008    Palliative Care Assessment & Plan   Patient Profile: 83 y.o.femalewith past medical history of history of CVA, stage II kidney disease,  A. fib, CAD, CHF, dementia,resident of skilled nursing facility 3-4 years,admitted on 11/12/2018with CHF, failure to thrive.   Assessment: CHF, failure to thrive: At this point relatively stable fluid balance, -99 yesterday, 50% of breakfast consumed.  Goal is to return to residential SNF with hospice.  We discussed but did not complete the MOST form.  [name] is continuing to diurese with increased Lasix.  Recommendations/Plan:  At this point, continue to treat the treatable but no CPR, no intubation.  Return to [facility], residential SNF with the benefits of [facility] hospice.  Continue "do not re-hospitalize" discussions.  Goals of Care and Additional Recommendations:  Limitations on Scope of Treatment: Continue to treat the treatable, no CPR, no intubation, no PEG tube.  Code Status:    Code Status Orders  (From admission, onward)        Start     Ordered   06/24/17 0014  Do not attempt resuscitation (DNR)  Continuous    Question Answer Comment  In the event of cardiac or respiratory ARREST Do not call a "code blue"   In the event of cardiac or respiratory ARREST Do not perform Intubation, CPR, defibrillation or ACLS   In the event of cardiac or respiratory ARREST Use medication by any route, position, wound care, and other measures to relive pain and suffering. May use oxygen, suction and manual treatment of airway obstruction as needed for comfort.      06/24/17 0013    Code Status History    Date Active Date Inactive Code Status Order ID Comments User Context   05/05/2017 18:59 05/19/2017 19:34 DNR [id number]  Karmen Bongo, MD Inpatient   03/09/2017 14:24 03/09/2017 22:36 DNR [id number]  Kathie Dike, MD Inpatient   03/07/2017 21:05 03/09/2017 14:24 Partial Code [id number]  Oswald Hillock, MD Inpatient   02/17/2017 20:37 02/19/2017 20:20 Partial Code [id number]  Reubin Milan, MD Inpatient   12/10/2016 21:08 12/11/2016 18:39 Full Code [id number]  Vianne Bulls, MD ED    09/27/2016 22:21 09/30/2016 15:20 Full Code [id number]  Reubin Milan, MD Inpatient   03/18/2016 00:02 03/21/2016 19:24 Full Code [id number]  Bonnell Public, MD Inpatient   04/12/2014 12:51 04/13/2014 22:25 DNR [id number]  Radene Gunning, NP Inpatient   08/20/2011 16:55 08/22/2011 18:47 Full Code [id number]  Rexene Alberts, MD Inpatient       Prognosis:   < 3 months would not be surprising based on frailty, continue functional decline, 4 hospitalizations in 6 months, continued low albumin of 2.3 on admit.  Discharge Planning:  .When stable, return to [facility] residential SNF with the benefits of [facility] hosp[facility].  Care plan was discussed with nursing staff, case management, social worker, and Dr. Roderic Palau.  Thank you for allowing the Palliative Medicine Team to assist in the care of this patient.   Time In:  1405 Time Out:  1420 Total Time  15 minutes Prolonged Time Billed  no       Greater than 50%  of this time was spent counseling and coordinating care related to the above assessment and plan.  Drue Novel, NP  Please contact Palliative Medicine Team phone at [phone] for questions and concerns.

## 2017-06-26 NOTE — Progress Notes (Signed)
PROGRESS NOTE    [name]  JAS:[id number] DOB: 1921/02/11 DOA: 06/23/2017 PCP: Hilbert Corrigan, MD     Brief Narrative:  [name] is a 83 yo female with history of dementia, CVA, CKD stage 2, persistent A fib, CHF, AS, MR who was at her cardiology visit and was sent to the hospital due to CHF exacerbation.  Patient was evaluated by cardiology on 11/6 and was given extra dose of Lasix due to weight gain.  She was reevaluated in office on 11/12 where she had lower extremity edema.  She was subsequently admitted at [facility] with cardiology consultation for treatment for acute on chronic diastolic heart failure with IV diuresis.  Assessment & Plan:   Principal Problem:   CHF (congestive heart failure) (HCC) Active Problems:   Hyponatremia   Lewy body dementia without behavioral disturbance   Pressure injury of skin   Anemia   Hyperkalemia   Encounter for hospice care discussion   Acute on chronic diastolic heart failure  -BNP 1232 on admission  -Echocardiogram shows preserved ejection fraction -Strict I's and O's, daily weight -Urine output is better today after increased dose of Lasix. -Still has evidence of volume overload and will need continued diuresis. -Since patient is unable to stand due to generalized weakness and lower extremity wounds, weights are obtained from bed scale.  These may be inaccurate.  Persistent atrial fibrillation -Continue diltiazem, Eliquis   Pressure ulcers, present on admission -Wound RN consulted   Hypothyroidism -Continue Synthroid  Hyperlipidemia -Continue Lipitor  DM type 2 with neuropathy -SSI   Severe protein calorie malnutrition -Consult dietitian  Dementia -Supportive care   Goals of care -Palliative care consult recommended by cardiology team due to patient's severely calcified aortic valve, dementia, DNR status.  After meeting with son, patient will likely return to nursing home with hospice services  to follow.     DVT prophylaxis: Eliquis  Code Status: DNR Family Communication: No family at bedside Disposition Plan: Return to nursing home with hospice services when she has sufficiently diuresed   Consultants:   Cardiology  Palliative care medicine   Procedures:  Echo: - Left ventricle: The cavity size was normal. Wall thickness was   increased in a pattern of moderate LVH. Systolic function was   vigorous. The estimated ejection fraction was in the range of 65%   to 70%. Wall motion was normal; there were no regional wall   motion abnormalities. The study was not technically sufficient to   allow evaluation of LV diastolic dysfunction due to atrial   fibrillation. Doppler parameters are consistent with high   ventricular filling pressure. - Ventricular septum: The contour showed systolic flattening. These   changes are consistent with RV pressure overload. - Aortic valve: Moderately thickened, moderately calcified   leaflets. There was moderate stenosis. Valve area (VTI): 1.18   cm^2. Valve area (Vmax): 0.88 cm^2. Valve area (Vmean): 0.94   cm^2. - Mitral valve: Severely calcified, moderately thickened annulus.   Mildly thickened leaflets . There was moderate to severe   regurgitation. The jet is eccentric and the severity may be   underestimated, particularly when accounting for left atrial   volume. - Left atrium: The atrium was massively dilated. - Right ventricle: The cavity size was moderately dilated. Wall   thickness was normal. Systolic function was mildly reduced. - Right atrium: The atrium was massively dilated. - Tricuspid valve: There was moderate regurgitation. - Pulmonary arteries: PA peak pressure: 70 mm  Hg (S). - Inferior vena cava: IVC is normal in caliber with diminished    respiratory variation, indicative of elevated CVP (8 mmHg).  Antimicrobials:  Anti-infectives (From admission, onward)   None       Subjective: Patient says she still  feels short of breath.  She is confused.  Objective: Vitals:   06/25/17 2040 06/25/17 2045 06/25/17 2136 06/26/17 0637  BP: 118/73 (!) 155/66  (!) 114/52  Pulse: 88 96  87  Resp:    15  Temp:    98.3 F (36.8 C)  TempSrc:    Oral  SpO2: 97%  96% 99%  Weight:    66.9 kg (147 lb 7.8 oz)  Height:        Intake/Output Summary (Last 24 hours) at 06/26/2017 1650 Last data filed at 06/26/2017 0500 Gross per 24 hour  Intake 580 ml  Output 2050 ml  Net -1470 ml   Filed Weights   06/24/17 0500 06/25/17 0515 06/26/17 0637  Weight: 63.9 kg (140 lb 14 oz) 66.7 kg (147 lb) 66.9 kg (147 lb 7.8 oz)    Examination:  General exam: Appears calm and comfortable  Respiratory system: +Crackles at bases. Respiratory effort normal on [place] O2  Cardiovascular system: S1 & S2 heard, Irregular rhythm, rate controlled. +3 systolic murmur. +1 pitting pedal edema. Gastrointestinal system: Abdomen is nondistended, soft and nontender. No organomegaly or masses felt. Normal bowel sounds heard. Central nervous system: Alert, nonfocal  Extremities: Symmetric  Psychiatry: +Dementia   Data Reviewed: I have personally reviewed following labs and imaging studies  CBC: Recent Labs  Lab 06/23/17 2148 06/24/17 0217 06/25/17 0524  WBC 7.4 6.5 5.2  NEUTROABS 5.1  --   --   HGB 10.7* 11.6* 10.0*  HCT 35.1* 37.8 32.2*  MCV 87.3 87.5 86.6  PLT 241 213 008   Basic Metabolic Panel: Recent Labs  Lab 06/23/17 1945 06/24/17 0217 06/25/17 0524 06/26/17 0529  NA 133* 135 135 137  K 5.4* 4.2 3.4* 3.2*  CL 100* 95* 94* 94*  CO2 24 34* 36* 37*  GLUCOSE 115* 140* 81 140*  BUN 29* 28* 30* 33*  CREATININE 0.85 0.78 0.63 0.67  CALCIUM 8.4* 8.9 8.2* 8.3*   GFR: Estimated Creatinine Clearance: 39.5 mL/min (by C-G formula based on SCr of 0.67 mg/dL). Liver Function Tests: Recent Labs  Lab 06/23/17 1945 06/24/17 0217  AST 25 23  ALT 14 12*  ALKPHOS 71 80  BILITOT 0.7 0.7  PROT 5.4* 6.1*  ALBUMIN 2.3*  2.7*   No results for input(s): LIPASE, AMYLASE in the last 168 hours. No results for input(s): AMMONIA in the last 168 hours. Coagulation Profile: No results for input(s): INR, PROTIME in the last 168 hours. Cardiac Enzymes: Recent Labs  Lab 06/23/17 1945 06/24/17 0217 06/24/17 0817  TROPONINI <0.03 <0.03 <0.03   BNP (last 3 results) No results for input(s): PROBNP in the last 8760 hours. HbA1C: Recent Labs    06/24/17 0217  HGBA1C 6.3*   CBG: Recent Labs  Lab 06/25/17 0835 06/25/17 1129 06/25/17 1607 06/25/17 2201 06/26/17 0752  GLUCAP 81 97 105* 112* 117*   Lipid Profile: No results for input(s): CHOL, HDL, LDLCALC, TRIG, CHOLHDL, LDLDIRECT in the last 72 hours. Thyroid Function Tests: Recent Labs    06/23/17 2148  TSH 4.361   Anemia Panel: No results for input(s): VITAMINB12, FOLATE, FERRITIN, TIBC, IRON, RETICCTPCT in the last 72 hours. Sepsis Labs: No results for input(s): PROCALCITON, LATICACIDVEN in the last  168 hours.  Recent Results (from the past 240 hour(s))  MRSA PCR Screening     Status: None   Collection Time: 06/24/17 12:14 AM  Result Value Ref Range Status   MRSA by PCR NEGATIVE NEGATIVE Final    Comment:        The GeneXpert MRSA Assay (FDA approved for NASAL specimens only), is one component of a comprehensive MRSA colonization surveillance program. It is not intended to diagnose MRSA infection nor to guide or monitor treatment for MRSA infections.        Radiology Studies: No results found.    Scheduled Meds: . apixaban  2.5 mg Oral BID  . atorvastatin  20 mg Oral Daily  . diltiazem  240 mg Oral Daily  . feeding supplement (PRO-STAT SUGAR FREE 64)  30 mL Oral TID WC  . furosemide  80 mg Intravenous Q12H  . gabapentin  200 mg Oral Q8H  . insulin aspart  0-5 Units Subcutaneous QHS  . insulin aspart  0-9 Units Subcutaneous TID WC  . levothyroxine  25 mcg Oral QPM  . mirtazapine  7.5 mg Oral QHS  . multivitamin  15 mL  Oral Daily  . nutrition supplement (JUVEN)  1 packet Oral BID BM  . polyethylene glycol  17 g Oral Daily  . sodium chloride flush  3 mL Intravenous Q12H   Continuous Infusions: . sodium chloride       LOS: 3 days    Time spent: 30 minutes   Kathie Dike, MD [facility] [email or web address] Password [email or web address] 06/26/2017, 4:50 PM

## 2017-06-27 DIAGNOSIS — D649 Anemia, unspecified: Secondary | ICD-10-CM

## 2017-06-27 DIAGNOSIS — E871 Hypo-osmolality and hyponatremia: Secondary | ICD-10-CM

## 2017-06-27 LAB — BASIC METABOLIC PANEL
ANION GAP: 5 (ref 5–15)
BUN: 34 mg/dL — AB (ref 6–20)
CALCIUM: 8 mg/dL — AB (ref 8.9–10.3)
CO2: 33 mmol/L — ABNORMAL HIGH (ref 22–32)
Chloride: 97 mmol/L — ABNORMAL LOW (ref 101–111)
Creatinine, Ser: 0.61 mg/dL (ref 0.44–1.00)
GFR calc Af Amer: >60 mL/min (ref >60)
GLUCOSE: 103 mg/dL — AB (ref 65–99)
Potassium: 4.4 mmol/L (ref 3.5–5.1)
Sodium: 135 mmol/L (ref 135–145)

## 2017-06-27 LAB — GLUCOSE, CAPILLARY
GLUCOSE-CAPILLARY: 90 mg/dL (ref 65–99)
GLUCOSE-CAPILLARY: 183 mg/dL — AB (ref 65–99)
Glucose-Capillary: 154 mg/dL — ABNORMAL HIGH (ref 65–99)

## 2017-06-27 MED ORDER — TORSEMIDE 20 MG PO TABS: 40.0000 mg | ORAL_TABLET | Freq: Two times a day (BID) | ORAL | Status: AC

## 2017-06-27 NOTE — Care Management (Signed)
Kamaljit, Hizer #[id number] (CSN: [id number]) (83 y.o. F) (Adm: 06/23/17)  [id number]  PCP   Hilbert Corrigan  Demographics  Comment    Last edited by  on at   Address: Home Phone: Work Phone: Theatre manager:  [place]  [place]  [place] [address]   [phone] -- [phone]  SSN: Insurance: Marital Status: Religion:  [id number] [place]  Patient Ethnicity & Race   Ethnic Group Patient Race  Not Hispanic or Latino White or Caucasian  Documents Filed to Patient   Power of Attorney Living Will Clinical Unknown Study Attachment Consent Form ABN Waiver After Visit Summary Lab Result Scan Code Status MyChart Status Advance Care Planning  Not on File Not on File Not on File Not on File Filed Not on File Jani Files DNR [Updated on 06/24/17 0013] Pending Jump to the [facility]  Admission Information   Attending Provider Admitting Provider Admission Type Admission Date/Time  Kathie Dike, MD Jani Gravel, MD Emergency 06/23/17 1737  Discharge Date Hospital Service Auth/Cert Status Service Area   Internal Medicine Incomplete [place]  Unit Room/Bed Admission Status   AP-DEPT 300 A335/A335-01 Admission (Confirmed)   Hospital Account   Name Acct ID Class Status Primary Coverage  Nikisha, [place] [id number] Inpatient Open UNITED HEALTHCARE MEDICARE - UHC MEDICARE      Guarantor Account (for [facility] [id number])   Name Relation to [place]? Acct Type  [address] T Self CHSA Yes Personal/Family  Address Phone    [facility] [place] [address] [place], [place] [address] [phone])        Coverage Information (for [facility] [id number])   1. [facility] MEDICARE   F/O Payor/Plan Precert #  [facility] #  Zettie, [place] [id number]  [place]  [address] [place] [address]  [phone]  2. MEDICAID [place]/MEDICAID OF [place]   F/O Payor/Plan Precert #  MEDICAID [place]/MEDICAID OF [place]   Subscriber Subscriber #  Nance, [place] [id number] [facility]  Address Phone  [address] [place], [place] [address] [phone]

## 2017-06-27 NOTE — NC FL2 (Signed)
[place] LEVEL OF CARE SCREENING TOOL     IDENTIFICATION  Patient Name: [name] Birthdate: 1921/02/14 Sex: female Admission Date (Current Location): 06/23/2017  [facility] and [place] Number:  [employer] and Address:  [place] [address] [place]      Provider Number: [id number]  Attending Physician Name and Address:  Kathie Dike, MD  Relative Name and Phone Number:       Current Level of Care: Hospital Recommended Level of Care: [place] Prior Approval Number:    Date Approved/Denied:   PASRR Number:    Discharge Plan: SNF    Current Diagnoses: Patient Active Problem List   Diagnosis Date Noted  . Encounter for hospice care discussion   . CHF (congestive heart failure) ([place]) 06/23/2017  . Anemia 06/23/2017  . Hyperkalemia 06/23/2017  . Goals of care, counseling/discussion   . Palliative care encounter   . Bleeding from PICC line, sequela   . Acute on chronic congestive heart failure ([place])   . Anasarca 05/05/2017  . Severe protein-calorie malnutrition ([place]) 05/05/2017  . AKI (acute kidney injury) ([place]) 03/07/2017  . Cellulitis 03/07/2017  . Pressure injury of skin 02/18/2017  . Acute encephalopathy 02/18/2017  . Atrial fibrillation, rapid ([place]) 02/18/2017  . Permanent atrial fibrillation ([place]) 12/27/2016  . CAD in native artery 12/27/2016  . SIRS (systemic inflammatory response syndrome) ([place]) 12/10/2016  . Elevated troponin 12/10/2016  . SOB (shortness of breath)   . Lewy body dementia without behavioral disturbance   . Acute hypoxemic respiratory failure ([place]) 03/18/2016  . Hyponatremia 03/18/2016  . Abdominal distention 04/13/2014  . Hip pain 04/12/2014  . Labral tear of hip, degenerative 04/12/2014  . Trochanteric bursitis of right hip 04/12/2014  . Encounter for therapeutic drug monitoring 10/06/2013  . Lower extremity edema 08/20/2011  . Arteriosclerotic cardiovascular  disease (ASCVD)   . Carcinoma of breast ([place])   . Atrial fibrillation with RVR ([place])   . MVP (mitral valve prolapse)   . PVD (peripheral vascular disease) ([place])   . CVD (cerebrovascular disease)   . Thyroid activity decreased   . Osteoporosis   . Chronic anticoagulation 11/22/2010  . Type 2 diabetes mellitus ([place]) 11/21/2009  . Hyperlipidemia 11/21/2009  . Hypertension 11/21/2009  . Acute on chronic diastolic heart failure ([place]) 11/21/2009  . SPINAL STENOSIS, LUMBAR 09/05/2008    Orientation RESPIRATION BLADDER Height & Weight     Self  O2(2L) Incontinent Weight: 136 lb 4.8 oz (61.8 kg) Height:  5\' 3"  (160 cm)  BEHAVIORAL SYMPTOMS/MOOD NEUROLOGICAL BOWEL NUTRITION STATUS      Incontinent Diet(Heart Healthy/carb modified)  AMBULATORY STATUS COMMUNICATION OF NEEDS Skin   Extensive Assist Verbally Other (Comment)(deep tissue injury, sacrum; unstagable, left heel.)                       Personal Care Assistance Level of Assistance  Bathing, Feeding, Dressing Bathing Assistance: Maximum assistance Feeding assistance: Limited assistance Dressing Assistance: Maximum assistance     Functional Limitations Info  Sight, Hearing, Speech Sight Info: Adequate Hearing Info: Adequate Speech Info: Adequate    SPECIAL CARE FACTORS FREQUENCY                       Contractures      Additional Factors Info  Code Status, Psychotropic Code Status Info: DNR   Psychotropic Info: Remeron         Current  Medications (06/27/2017):  This is the current hospital active medication list Current Facility-Administered Medications  Medication Dose Route Frequency Provider Last Rate Last Dose  . 0.9 %  sodium chloride infusion  250 mL Intravenous PRN Jani Gravel, MD      . acetaminophen (TYLENOL) tablet 650 mg  650 mg Oral Q6H PRN Jani Gravel, MD       Or  . acetaminophen (TYLENOL) suppository 650 mg  650 mg Rectal Q6H PRN Jani Gravel, MD      . apixaban Arne Cleveland) tablet 2.5 mg   2.5 mg Oral BID Jani Gravel, MD   2.5 mg at 06/27/17 0839  . atorvastatin (LIPITOR) tablet 20 mg  20 mg Oral Daily Jani Gravel, MD   20 mg at 06/27/17 0839  . diltiazem (CARDIZEM CD) 24 hr capsule 240 mg  240 mg Oral Daily Lendon Colonel, NP   240 mg at 06/27/17 0839  . feeding supplement (PRO-STAT SUGAR FREE 64) liquid 30 mL  30 mL Oral TID WC Jani Gravel, MD   30 mL at 06/27/17 1214  . furosemide (LASIX) injection 80 mg  80 mg Intravenous Q12H Kathie Dike, MD   80 mg at 06/27/17 0839  . gabapentin (NEURONTIN) capsule 200 mg  200 mg Oral Q8H Jani Gravel, MD   200 mg at 06/27/17 0631  . HYDROcodone-acetaminophen (NORCO/VICODIN) 5-325 MG per tablet 1-2 tablet  1-2 tablet Oral Q4H PRN Jani Gravel, MD   2 tablet at 06/26/17 2030  . insulin aspart (novoLOG) injection 0-5 Units  0-5 Units Subcutaneous QHS Jani Gravel, MD      . insulin aspart (novoLOG) injection 0-9 Units  0-9 Units Subcutaneous TID WC Jani Gravel, MD   2 Units at 06/27/17 1214  . levothyroxine (SYNTHROID, LEVOTHROID) tablet 25 mcg  25 mcg Oral QPM Jani Gravel, MD   25 mcg at 06/26/17 1822  . mirtazapine (REMERON) tablet 7.5 mg  7.5 mg Oral QHS Jani Gravel, MD   7.5 mg at 06/26/17 2030  . multivitamin liquid 15 mL  15 mL Oral Daily Dessa Phi, DO   15 mL at 06/27/17 0840  . nutrition supplement (JUVEN) (JUVEN) powder packet 1 packet  1 packet Oral BID BM Dessa Phi, DO   1 packet at 06/27/17 0840  . ondansetron (ZOFRAN) injection 4 mg  4 mg Intravenous Q6H PRN Jani Gravel, MD      . polyethylene glycol (MIRALAX / GLYCOLAX) packet 17 g  17 g Oral Daily Jani Gravel, MD   17 g at 06/26/17 0905  . sodium chloride flush (NS) 0.9 % injection 3 mL  3 mL Intravenous Q12H Jani Gravel, MD   3 mL at 06/27/17 0841  . sodium chloride flush (NS) 0.9 % injection 3 mL  3 mL Intravenous PRN Jani Gravel, MD      . sodium phosphate (FLEET) 7-19 GM/118ML enema 1 enema  1 enema Rectal Daily PRN Jani Gravel, MD         Discharge Medications: Please  see discharge summary for a list of discharge medications.  Relevant Imaging Results:  Relevant Lab Results:   Additional Information    Zyad Boomer, [name], LCSW

## 2017-06-27 NOTE — Progress Notes (Signed)
Pt discharged to [facility] per Dr. Roderic Palau. Pt's IV site D/C'd and WDL. Pt's VSS. Report called to Maudie Mercury, nurse at [facility]. Verbalized understanding. Pt currently awaiting EMS arrival for transport back to [facility].

## 2017-06-27 NOTE — Care Management Important Message (Signed)
Important Message  Patient Details  Name: [name] MRN: [id number] Date of Birth: [birth year]   Medicare Important Message Given:  Yes    Molly Maselli, Chauncey Reading, RN 06/27/2017, 2:35 PM

## 2017-06-27 NOTE — Discharge Summary (Signed)
Physician Discharge Summary  [name] FGH:[id number] DOB: December 09, 1920 DOA: 06/23/2017  PCP: Hilbert Corrigan, MD  Admit date: 06/23/2017 Discharge date: 06/27/2017  Admitted From: SNF Disposition:  SNF  Recommendations for Outpatient Follow-up:  1. Return to SNF with hospice services for end of life care 2. If/When patient decompensates, would recommend focusing care on comfort and quality of life.  Discharge Condition: hospice CODE STATUS:DNR Diet recommendation: Heart Healthy / Carb Modified   Brief/Interim Summary: [name] is a 83 yo female with history of dementia, CVA, CKD stage 2, persistent A fib, CHF, AS, MR who was at her cardiology visit and was sent to the hospital due to CHF exacerbation.  Patient was evaluated by cardiology on 11/6 and was given extra dose of Lasix due to weight gain.  She was reevaluated in office on 11/12 where she had lower extremity edema.  She was subsequently admitted at [facility] with cardiology consultation for treatment for acute on chronic diastolic heart failure with IV diuresis.    Discharge Diagnoses:  Principal Problem:   CHF (congestive heart failure) (HCC) Active Problems:   Hyponatremia   Lewy body dementia without behavioral disturbance   Pressure injury of skin   Anemia   Hyperkalemia   Encounter for hospice care discussion  Acute on chronic diastolic heart failure  -BNP 1232 on admission  -Echocardiogram shows preserved ejection fraction -Strict I's and O's, daily weight -Patient was treated with intravenous Lasix.  She has had fair urine output. -Respiratory status appears to be stable.  She does not appear to be in any distress. -Case was discussed with cardiology who felt that due to her severe mitral regurgitation and pulmonary hypertension, her long-term prognosis is poor.  Despite adequate diuresis, she will likely decompensate and continue to return to the hospital with decompensation.  Hospice  services were recommended from a cardiac standpoint.  She was seen by palliative care services who discussed patient returning to skilled nursing facility with hospice services.  Her son is agreeable for this.  I have also had an honest discussion with the patient's son indicating the severity of her cardiac disease as well as her overall condition/frailty.  I explained to him, that the patient will likely decompensate in terms of CHF and at that time would be most appropriate for residential hospice.  He is agreeable for this.  Persistent atrial fibrillation -Continue diltiazem, Eliquis   Pressure ulcers, present on admission -Wound RN consulted   Hypothyroidism -Continue Synthroid  Hyperlipidemia -Continue Lipitor  DM type 2 with neuropathy -SSI   Severe protein calorie malnutrition -Consult dietitian  Dementia -Supportive care   Goals of care -Palliative care consult recommended by cardiology team due to patient's severely calcified aortic valve, dementia, DNR status.  After meeting with son, patient will likely return to nursing home with hospice services to follow.     Discharge Instructions  Discharge Instructions    Diet - low sodium heart healthy   Complete by:  As directed    Increase activity slowly   Complete by:  As directed      Allergies as of 06/27/2017   No Known Allergies     Medication List    STOP taking these medications   levofloxacin 500 MG tablet Commonly known as:  LEVAQUIN     TAKE these medications   apixaban 2.5 MG Tabs tablet Commonly known as:  ELIQUIS Take 1 tablet (2.5 mg total) by mouth 2 (two) times daily.  atorvastatin 20 MG tablet Commonly known as:  LIPITOR Take 20 mg by mouth daily.   calcium carbonate 500 MG chewable tablet Commonly known as:  TUMS - dosed in mg elemental calcium Chew 1 tablet by mouth 3 (three) times daily before meals.   cholecalciferol 1000 units tablet Commonly known as:  VITAMIN D Take  1,000 Units by mouth daily.   diltiazem 240 MG 24 hr capsule Commonly known as:  DILTIAZEM CD Take 1 capsule (240 mg total) by mouth daily.   feeding supplement (PRO-STAT SUGAR FREE 64) Liqd Take 30 mLs by mouth 3 (three) times daily with meals.   gabapentin 100 MG capsule Commonly known as:  NEURONTIN Take 200 mg by mouth every 8 (eight) hours.   HYDROcodone-acetaminophen 5-325 MG tablet Commonly known as:  NORCO/VICODIN Take 1-2 tablets by mouth every 4 (four) hours as needed for moderate pain.   levalbuterol 0.63 MG/3ML nebulizer solution Commonly known as:  XOPENEX Inhale 3 mLs into the lungs 3 (three) times daily as needed for wheezing or cough. Via nebulizer   levothyroxine 25 MCG tablet Commonly known as:  SYNTHROID, LEVOTHROID Take 25 mcg by mouth every evening.   mirtazapine 7.5 MG tablet Commonly known as:  REMERON Take 7.5 mg by mouth at bedtime.   ondansetron 4 MG tablet Commonly known as:  ZOFRAN Take 1 mg by mouth every 6 (six) hours as needed for nausea or vomiting.   OXYGEN Inhale 2 L daily into the lungs.   polyethylene glycol packet Commonly known as:  MIRALAX / GLYCOLAX Take 17 g by mouth daily.   PROBIOTIC FORMULA PO Take 1 capsule by mouth daily.   sodium phosphate 7-19 GM/118ML Enem Place 133 mLs (1 enema total) rectally daily as needed for mild constipation or severe constipation.   torsemide 20 MG tablet Commonly known as:  DEMADEX Take 2 tablets (40 mg total) 2 (two) times daily by mouth. What changed:  when to take this       No Known Allergies  Consultations:  Cardiology  Palliative care   Procedures/Studies: Dg Chest 2 View  Result Date: 06/23/2017 CLINICAL DATA:  Patient is AMS. Notes state she was sent over to ED from cardiology. Hx of a-fib, ASCVD, bronchitis, carcinoma of breast, CHF, diabetes, HTN, , mitral valve prolapse, left mastectomy, cardiac surgery. EXAM: CHEST  2 VIEW COMPARISON:  05/15/2017 FINDINGS: Moderate  enlargement of the cardiopericardial silhouette. No convincing mediastinal or hilar masses. There is vascular congestion with interstitial thickening. Moderate pleural effusions obscure the hemidiaphragms. These findings are similar to the prior exam. Right PICC has been removed. No pneumothorax. IMPRESSION: 1. Congestive heart failure reflected by cardiomegaly and interstitial pulmonary edema and moderate bilateral pleural effusions. Electronically Signed   By: Lajean Manes M.D.   On: 06/23/2017 19:03    Echo: - Left ventricle: The cavity size was normal. Wall thickness was   increased in a pattern of moderate LVH. Systolic function was   vigorous. The estimated ejection fraction was in the range of 65%   to 70%. Wall motion was normal; there were no regional wall   motion abnormalities. The study was not technically sufficient to   allow evaluation of LV diastolic dysfunction due to atrial   fibrillation. Doppler parameters are consistent with high   ventricular filling pressure. - Ventricular septum: The contour showed systolic flattening. These   changes are consistent with RV pressure overload. - Aortic valve: Moderately thickened, moderately calcified   leaflets. There was moderate  stenosis. Valve area (VTI): 1.18   cm^2. Valve area (Vmax): 0.88 cm^2. Valve area (Vmean): 0.94   cm^2. - Mitral valve: Severely calcified, moderately thickened annulus.   Mildly thickened leaflets . There was moderate to severe   regurgitation. The jet is eccentric and the severity may be   underestimated, particularly when accounting for left atrial   volume. - Left atrium: The atrium was massively dilated. - Right ventricle: The cavity size was moderately dilated. Wall   thickness was normal. Systolic function was mildly reduced. - Right atrium: The atrium was massively dilated. - Tricuspid valve: There was moderate regurgitation. - Pulmonary arteries: PA peak pressure: 70 mm Hg (S). - Inferior vena  cava: IVC is normal in caliber with diminished   respiratory variation, indicative of elevated CVP (8 mmHg).   Subjective: Overall breathing is improving, no chest pain  Discharge Exam: Vitals:   06/27/17 0658 06/27/17 1347  BP: 139/73 (!) 112/55  Pulse: (!) 102 75  Resp: 20 16  Temp: 98.5 F (36.9 C) 98.2 F (36.8 C)  SpO2: 93% 93%   Vitals:   06/26/17 2046 06/27/17 0500 06/27/17 0658 06/27/17 1347  BP: (!) 146/78  139/73 (!) 112/55  Pulse: (!) 135  (!) 102 75  Resp:   20 16  Temp: 99.1 F (37.3 C)  98.5 F (36.9 C) 98.2 F (36.8 C)  TempSrc: Oral  Oral Oral  SpO2: 93%  93% 93%  Weight:  61.8 kg (136 lb 4.8 oz)    Height:        General: Pt is alert, awake, not in acute distress Cardiovascular: irregular, S1/S2 +, no rubs, no gallops Respiratory: crackles at bases Abdominal: Soft, NT, ND, bowel sounds + Extremities: 1+ edema, no cyanosis    The results of significant diagnostics from this hospitalization (including imaging, microbiology, ancillary and laboratory) are listed below for reference.     Microbiology: Recent Results (from the past 240 hour(s))  MRSA PCR Screening     Status: None   Collection Time: 06/24/17 12:14 AM  Result Value Ref Range Status   MRSA by PCR NEGATIVE NEGATIVE Final    Comment:        The GeneXpert MRSA Assay (FDA approved for NASAL specimens only), is one component of a comprehensive MRSA colonization surveillance program. It is not intended to diagnose MRSA infection nor to guide or monitor treatment for MRSA infections.      Labs: BNP (last 3 results) Recent Labs    02/17/17 0950 05/05/17 1516 06/23/17 2148  BNP 1,190.0* 1,982.0* 2,542.7*   Basic Metabolic Panel: Recent Labs  Lab 06/24/17 0217 06/25/17 0524 06/26/17 0529 06/26/17 2120 06/27/17 0555  NA 135 135 137 138 135  K 4.2 3.4* 3.2* 3.4* 4.4  CL 95* 94* 94* 96* 97*  CO2 34* 36* 37* 32 33*  GLUCOSE 140* 81 140* 189* 103*  BUN 28* 30* 33* 35* 34*   CREATININE 0.78 0.63 0.67 0.71 0.61  CALCIUM 8.9 8.2* 8.3* 8.2* 8.0*  MG  --   --   --  2.0  --    Liver Function Tests: Recent Labs  Lab 06/23/17 1945 06/24/17 0217  AST 25 23  ALT 14 12*  ALKPHOS 71 80  BILITOT 0.7 0.7  PROT 5.4* 6.1*  ALBUMIN 2.3* 2.7*   No results for input(s): LIPASE, AMYLASE in the last 168 hours. No results for input(s): AMMONIA in the last 168 hours. CBC: Recent Labs  Lab 06/23/17 2148 06/24/17 0217  06/25/17 0524  WBC 7.4 6.5 5.2  NEUTROABS 5.1  --   --   HGB 10.7* 11.6* 10.0*  HCT 35.1* 37.8 32.2*  MCV 87.3 87.5 86.6  PLT 241 213 231   Cardiac Enzymes: Recent Labs  Lab 06/23/17 1945 06/24/17 0217 06/24/17 0817  TROPONINI <0.03 <0.03 <0.03   BNP: Invalid input(s): POCBNP CBG: Recent Labs  Lab 06/26/17 0752 06/26/17 1705 06/26/17 2037 06/27/17 0752 06/27/17 1131  GLUCAP 117* 193* 163* 90 183*   D-Dimer No results for input(s): DDIMER in the last 72 hours. Hgb A1c No results for input(s): HGBA1C in the last 72 hours. Lipid Profile No results for input(s): CHOL, HDL, LDLCALC, TRIG, CHOLHDL, LDLDIRECT in the last 72 hours. Thyroid function studies No results for input(s): TSH, T4TOTAL, T3FREE, THYROIDAB in the last 72 hours.  Invalid input(s): FREET3 Anemia work up No results for input(s): VITAMINB12, FOLATE, FERRITIN, TIBC, IRON, RETICCTPCT in the last 72 hours. Urinalysis    Component Value Date/Time   COLORURINE STRAW (A) 05/05/2017 1651   APPEARANCEUR CLEAR 05/05/2017 1651   LABSPEC 1.006 05/05/2017 1651   PHURINE 6.0 05/05/2017 1651   GLUCOSEU NEGATIVE 05/05/2017 1651   HGBUR SMALL (A) 05/05/2017 1651   BILIRUBINUR NEGATIVE 05/05/2017 1651   KETONESUR NEGATIVE 05/05/2017 1651   PROTEINUR NEGATIVE 05/05/2017 1651   UROBILINOGEN 0.2 04/19/2014 1730   NITRITE NEGATIVE 05/05/2017 1651   LEUKOCYTESUR TRACE (A) 05/05/2017 1651   Sepsis Labs Invalid input(s): PROCALCITONIN,  WBC,  LACTICIDVEN Microbiology Recent  Results (from the past 240 hour(s))  MRSA PCR Screening     Status: None   Collection Time: 06/24/17 12:14 AM  Result Value Ref Range Status   MRSA by PCR NEGATIVE NEGATIVE Final    Comment:        The GeneXpert MRSA Assay (FDA approved for NASAL specimens only), is one component of a comprehensive MRSA colonization surveillance program. It is not intended to diagnose MRSA infection nor to guide or monitor treatment for MRSA infections.      Time coordinating discharge: Over 30 minutes  SIGNED:   Kathie Dike, MD  [facility] 06/27/2017, 2:23 PM Pager   If 7PM-7AM, please contact night-coverage [email or web address] Password [email or web address]

## 2017-08-08 ENCOUNTER — Other Ambulatory Visit: Payer: Self-pay

## 2017-08-08 NOTE — Patient Outreach (Signed)
[facility]) Care Management  08/08/2017  [name] 08-May-1921 [id number]   Medication Adherence call to [name] patient is showing past due under [facility] Ins.on Metformin 500 mg patient is at a home care facility at this time.  [facility] Management Direct Dial [phone]  Fax [phone] [email or web address]

## 2017-10-10 DEATH — deceased

## 2018-02-03 IMAGING — CT CT HEAD W/O CM
3 of 6 series · 16 of 47 positions shown, 19 images · non-contrast
Comparison: 02/17/2017

CLINICAL DATA: Altered mental status

EXAM:
CT HEAD WITHOUT CONTRAST
TECHNIQUE: Contiguous axial images were obtained from the base of the skull
through the vertex without intravenous contrast.

[Series 4: head wo · axial · 0.39mm/px · z∈[+90,+220]mm · 11 of 30 slices shown, 14 images]
[im 2/30  brain]
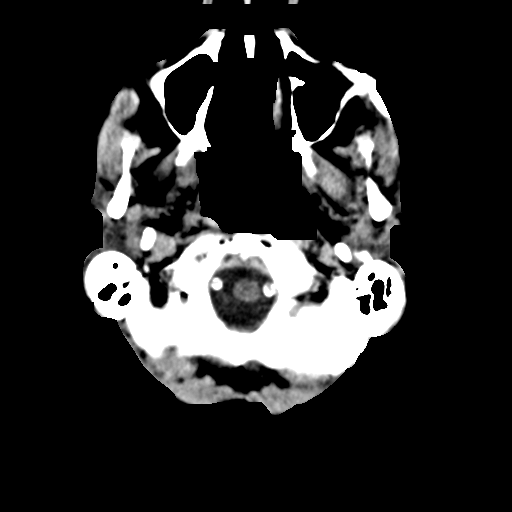
[im 2/30  bone]
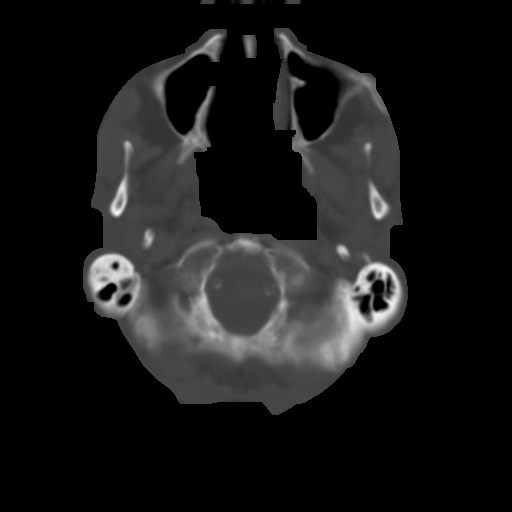
[im 4/30  brain]
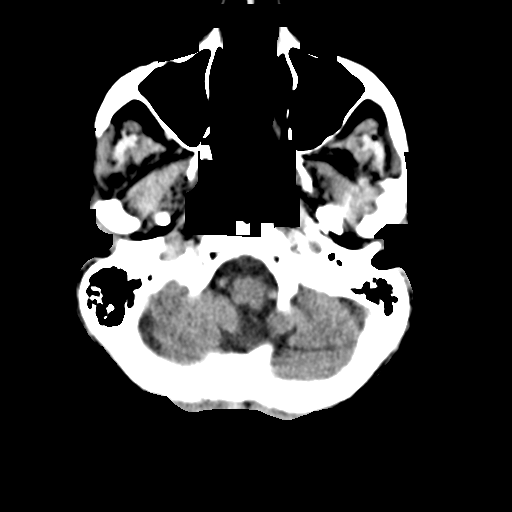
[im 8/30  brain]
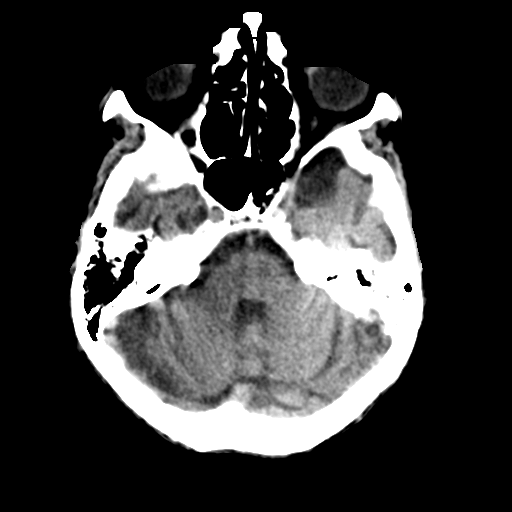
[im 10/30  brain]
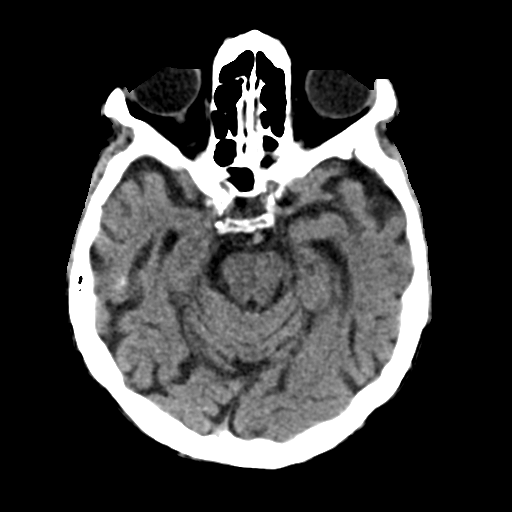
[im 12/30  brain]
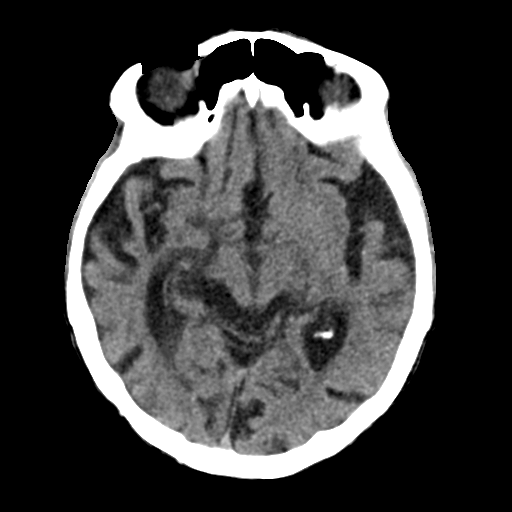
[im 12/30  bone]
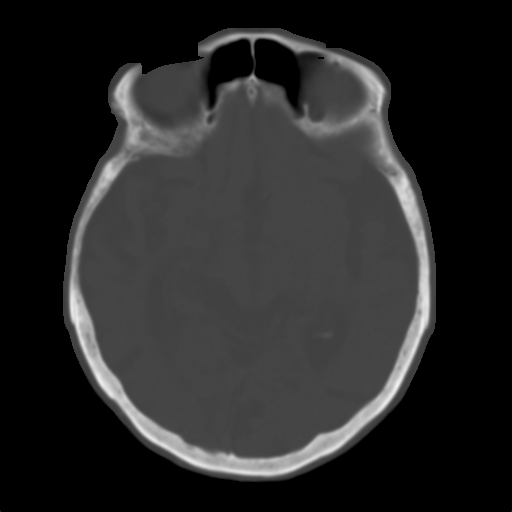
[im 16/30  brain]
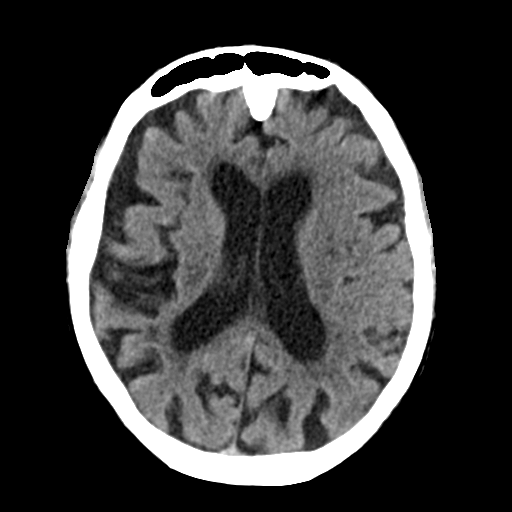
[im 18/30  brain]
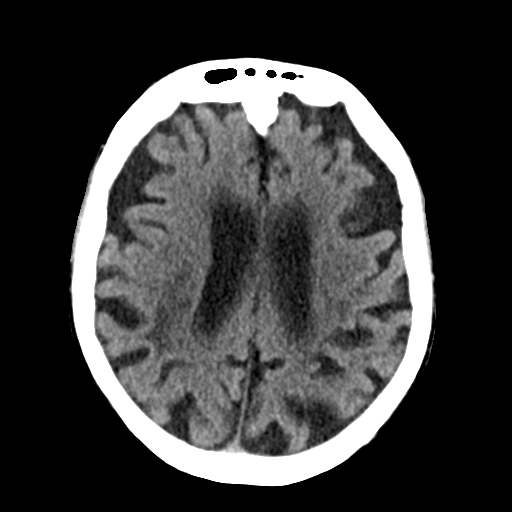
[im 20/30  brain]
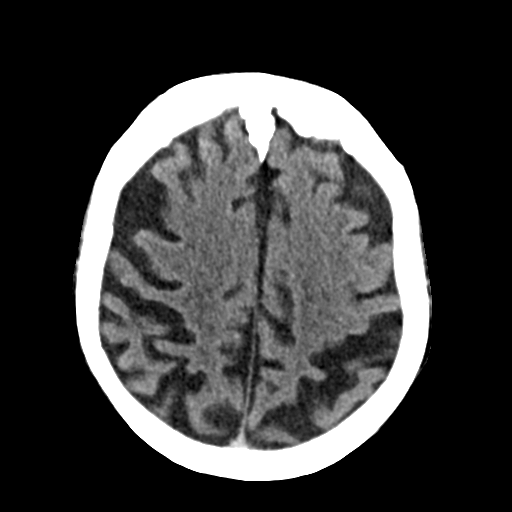
[im 22/30  brain]
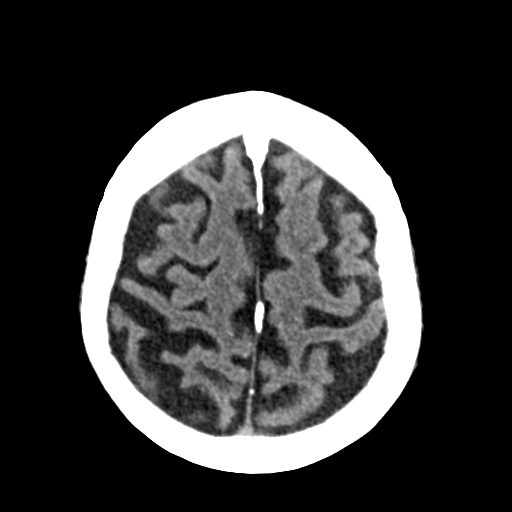
[im 22/30  bone]
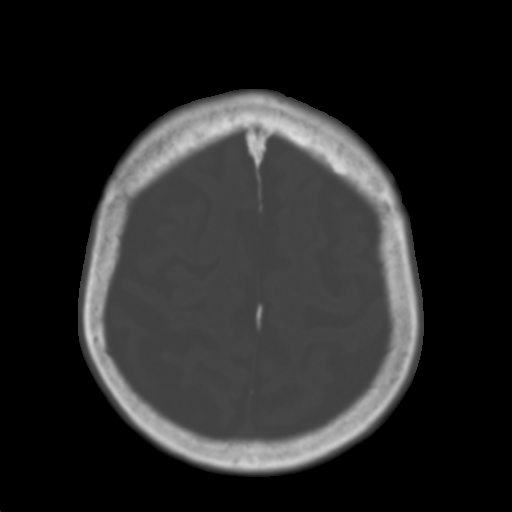
[im 26/30  brain]
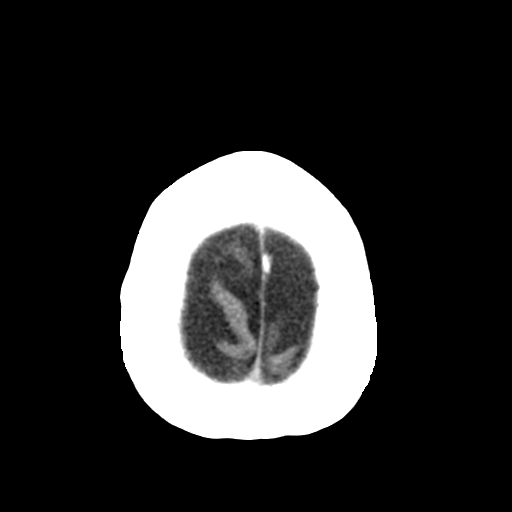
[im 28/30  brain]
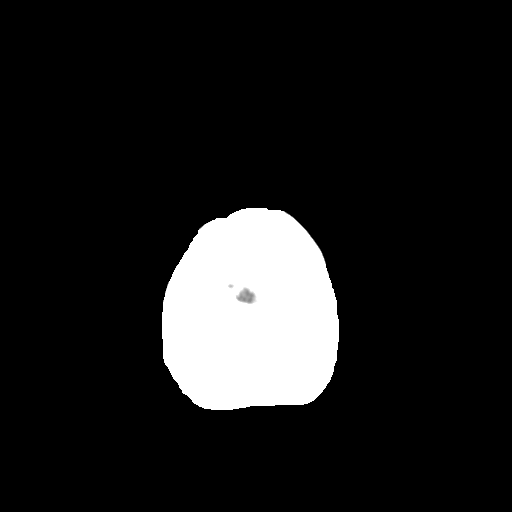

[Series 6: coronal soft tissue · coronal · 0.30mm/px · 3 of 68 slices shown]
[im 17/68  brain]
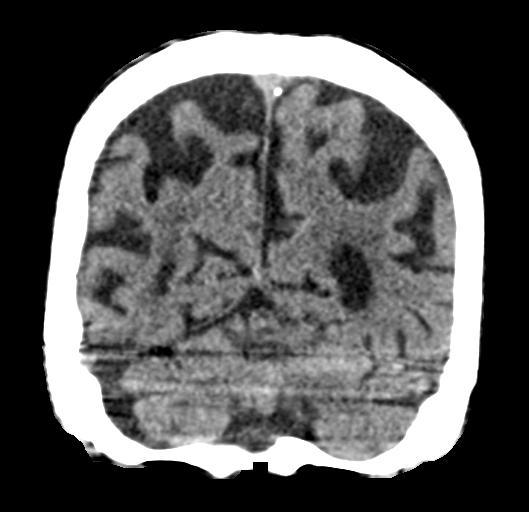
[im 34/68  brain]
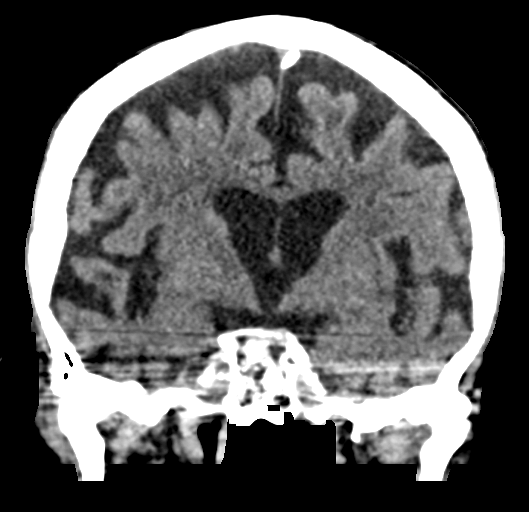
[im 51/68  brain]
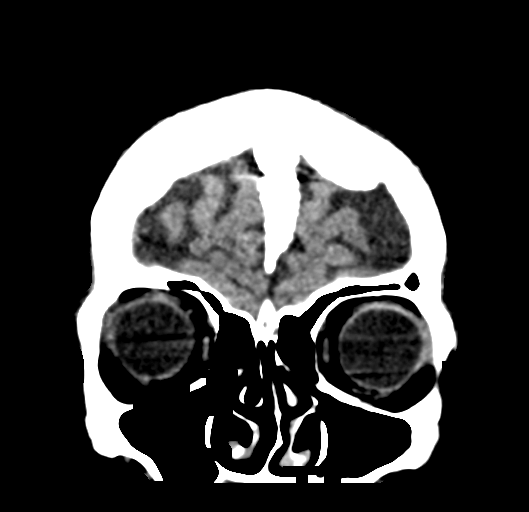

[Series 7: sagittal soft tissue · sagittal · 0.30mm/px · 2 of 54 slices shown]
[im 18/54  brain]
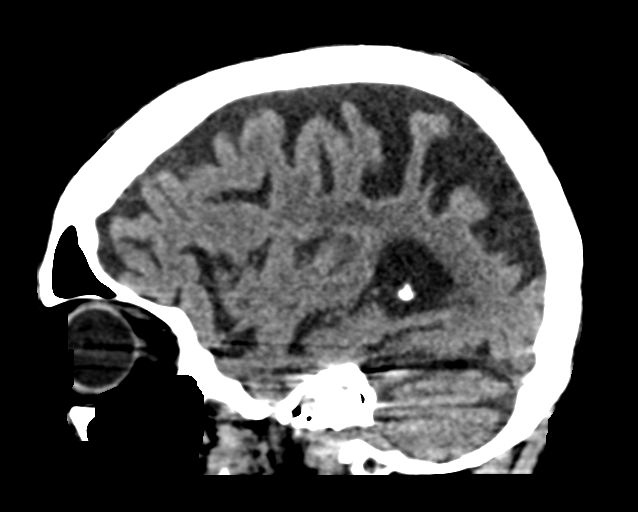
[im 36/54  brain]
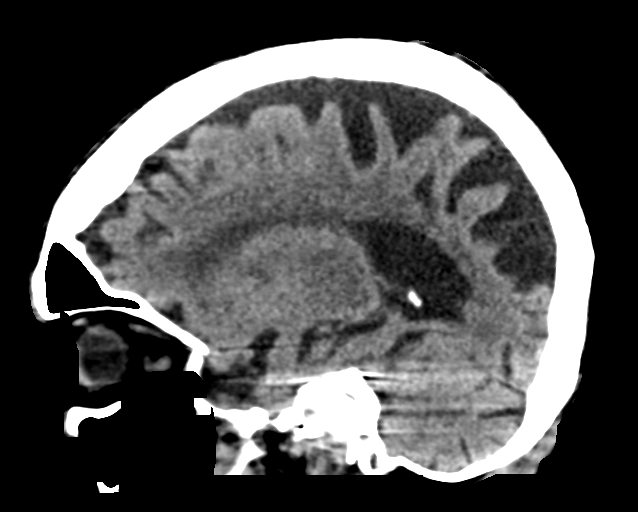

[16 of 47 positions shown; findings below may reference images not displayed]

FINDINGS: Brain: Diffuse atrophic changes are identified. Mild chronic white
matter ischemic change is seen and stable. No findings to suggest
acute hemorrhage, acute infarction or space-occupying mass lesion
are noted.

Vascular: No hyperdense vessel or unexpected calcification.

Skull: Normal. Negative for fracture or focal lesion.

Sinuses/Orbits: No acute finding.

Other: None.
IMPRESSION: Atrophic change and chronic white matter ischemic change similar to
that seen on prior exam

No acute abnormality noted.
